# Patient Record
Sex: Female | Born: 1945
Health system: Southern US, Community
[De-identification: ages and names within clinical notes are randomized; demographics above are authoritative.]

## PROBLEM LIST (undated history)

## (undated) DIAGNOSIS — C946 Myelodysplastic disease, not classified: Secondary | ICD-10-CM

## (undated) DIAGNOSIS — F32A Depression, unspecified: Secondary | ICD-10-CM

## (undated) DIAGNOSIS — F419 Anxiety disorder, unspecified: Secondary | ICD-10-CM

## (undated) DIAGNOSIS — K449 Diaphragmatic hernia without obstruction or gangrene: Secondary | ICD-10-CM

## (undated) DIAGNOSIS — E611 Iron deficiency: Secondary | ICD-10-CM

## (undated) DIAGNOSIS — Z9289 Personal history of other medical treatment: Secondary | ICD-10-CM

## (undated) DIAGNOSIS — F329 Major depressive disorder, single episode, unspecified: Secondary | ICD-10-CM

## (undated) DIAGNOSIS — K5790 Diverticulosis of intestine, part unspecified, without perforation or abscess without bleeding: Secondary | ICD-10-CM

## (undated) DIAGNOSIS — K219 Gastro-esophageal reflux disease without esophagitis: Secondary | ICD-10-CM

## (undated) DIAGNOSIS — I10 Essential (primary) hypertension: Secondary | ICD-10-CM

## (undated) DIAGNOSIS — E785 Hyperlipidemia, unspecified: Secondary | ICD-10-CM

## (undated) DIAGNOSIS — D126 Benign neoplasm of colon, unspecified: Secondary | ICD-10-CM

## (undated) DIAGNOSIS — D649 Anemia, unspecified: Secondary | ICD-10-CM

## (undated) HISTORY — DX: Depression, unspecified: F32.A

## (undated) HISTORY — PX: TUBAL LIGATION: SHX77

## (undated) HISTORY — DX: Anxiety disorder, unspecified: F41.9

## (undated) HISTORY — PX: KNEE SURGERY: SHX244

## (undated) HISTORY — DX: Diaphragmatic hernia without obstruction or gangrene: K44.9

## (undated) HISTORY — DX: Major depressive disorder, single episode, unspecified: F32.9

## (undated) HISTORY — DX: Anemia, unspecified: D64.9

## (undated) HISTORY — DX: Iron deficiency: E61.1

## (undated) HISTORY — DX: Gastro-esophageal reflux disease without esophagitis: K21.9

## (undated) HISTORY — DX: Diverticulosis of intestine, part unspecified, without perforation or abscess without bleeding: K57.90

## (undated) HISTORY — DX: Hyperlipidemia, unspecified: E78.5

## (undated) HISTORY — DX: Benign neoplasm of colon, unspecified: D12.6

---

## 1999-09-06 ENCOUNTER — Other Ambulatory Visit: Admission: RE | Admit: 1999-09-06 | Discharge: 1999-09-06 | Payer: Self-pay | Admitting: Family Medicine

## 2000-03-07 ENCOUNTER — Encounter: Payer: Self-pay | Admitting: Gastroenterology

## 2000-03-07 ENCOUNTER — Ambulatory Visit (HOSPITAL_COMMUNITY): Admission: RE | Admit: 2000-03-07 | Discharge: 2000-03-07 | Payer: Self-pay | Admitting: Gastroenterology

## 2002-09-21 ENCOUNTER — Encounter: Admission: RE | Admit: 2002-09-21 | Discharge: 2002-09-21 | Payer: Self-pay | Admitting: Family Medicine

## 2002-09-21 ENCOUNTER — Encounter: Payer: Self-pay | Admitting: Family Medicine

## 2003-06-13 ENCOUNTER — Encounter: Admission: RE | Admit: 2003-06-13 | Discharge: 2003-06-13 | Payer: Self-pay | Admitting: Family Medicine

## 2003-06-13 ENCOUNTER — Encounter: Payer: Self-pay | Admitting: Family Medicine

## 2003-06-28 ENCOUNTER — Other Ambulatory Visit: Admission: RE | Admit: 2003-06-28 | Discharge: 2003-06-28 | Payer: Self-pay | Admitting: Obstetrics and Gynecology

## 2003-08-05 ENCOUNTER — Encounter (INDEPENDENT_AMBULATORY_CARE_PROVIDER_SITE_OTHER): Payer: Self-pay | Admitting: *Deleted

## 2003-08-05 ENCOUNTER — Ambulatory Visit (HOSPITAL_COMMUNITY): Admission: RE | Admit: 2003-08-05 | Discharge: 2003-08-05 | Payer: Self-pay | Admitting: Obstetrics and Gynecology

## 2005-02-18 ENCOUNTER — Other Ambulatory Visit: Admission: RE | Admit: 2005-02-18 | Discharge: 2005-02-18 | Payer: Self-pay | Admitting: Obstetrics and Gynecology

## 2005-03-11 ENCOUNTER — Encounter: Payer: Self-pay | Admitting: Family Medicine

## 2005-03-11 LAB — CONVERTED CEMR LAB

## 2005-04-03 ENCOUNTER — Ambulatory Visit: Payer: Self-pay | Admitting: Family Medicine

## 2005-09-12 ENCOUNTER — Ambulatory Visit: Payer: Self-pay | Admitting: Family Medicine

## 2005-10-31 ENCOUNTER — Ambulatory Visit: Payer: Self-pay | Admitting: Family Medicine

## 2005-11-25 ENCOUNTER — Encounter: Admission: RE | Admit: 2005-11-25 | Discharge: 2005-11-25 | Payer: Self-pay | Admitting: Family Medicine

## 2006-06-17 ENCOUNTER — Ambulatory Visit: Payer: Self-pay | Admitting: Family Medicine

## 2006-07-02 ENCOUNTER — Ambulatory Visit: Payer: Self-pay | Admitting: Family Medicine

## 2006-11-10 ENCOUNTER — Ambulatory Visit: Payer: Self-pay | Admitting: Family Medicine

## 2006-12-01 ENCOUNTER — Ambulatory Visit: Payer: Self-pay | Admitting: Family Medicine

## 2007-02-16 ENCOUNTER — Ambulatory Visit: Payer: Self-pay | Admitting: Family Medicine

## 2007-03-12 DIAGNOSIS — D126 Benign neoplasm of colon, unspecified: Secondary | ICD-10-CM

## 2007-03-12 HISTORY — DX: Benign neoplasm of colon, unspecified: D12.6

## 2007-03-12 LAB — HM COLONOSCOPY

## 2007-04-07 ENCOUNTER — Ambulatory Visit: Payer: Self-pay | Admitting: Internal Medicine

## 2007-04-08 ENCOUNTER — Telehealth: Payer: Self-pay | Admitting: Family Medicine

## 2007-04-09 ENCOUNTER — Ambulatory Visit: Payer: Self-pay | Admitting: Internal Medicine

## 2007-04-09 ENCOUNTER — Inpatient Hospital Stay (HOSPITAL_COMMUNITY): Admission: EM | Admit: 2007-04-09 | Discharge: 2007-04-11 | Payer: Self-pay | Admitting: Emergency Medicine

## 2007-04-09 LAB — CONVERTED CEMR LAB
HCT: 15.2 % — CL (ref 36.0–46.0)
Hemoglobin: 4.5 g/dL — CL (ref 12.0–15.0)
WBC: 6.3 10*3/uL (ref 4.5–10.5)

## 2007-04-10 ENCOUNTER — Encounter: Payer: Self-pay | Admitting: Family Medicine

## 2007-04-10 ENCOUNTER — Encounter: Payer: Self-pay | Admitting: Gastroenterology

## 2007-04-11 ENCOUNTER — Encounter: Payer: Self-pay | Admitting: Internal Medicine

## 2007-04-13 ENCOUNTER — Ambulatory Visit: Payer: Self-pay | Admitting: Family Medicine

## 2007-04-13 DIAGNOSIS — Z86010 Personal history of colonic polyps: Secondary | ICD-10-CM | POA: Insufficient documentation

## 2007-04-14 ENCOUNTER — Ambulatory Visit: Payer: Self-pay | Admitting: Gastroenterology

## 2007-04-20 DIAGNOSIS — K5730 Diverticulosis of large intestine without perforation or abscess without bleeding: Secondary | ICD-10-CM | POA: Insufficient documentation

## 2007-04-20 DIAGNOSIS — K219 Gastro-esophageal reflux disease without esophagitis: Secondary | ICD-10-CM | POA: Insufficient documentation

## 2007-04-28 ENCOUNTER — Ambulatory Visit: Payer: Self-pay | Admitting: Family Medicine

## 2007-04-28 DIAGNOSIS — D649 Anemia, unspecified: Secondary | ICD-10-CM | POA: Insufficient documentation

## 2007-04-29 LAB — CONVERTED CEMR LAB
Eosinophils Absolute: 0 10*3/uL (ref 0.0–0.6)
Eosinophils Relative: 1.1 % (ref 0.0–5.0)
HCT: 29 % — ABNORMAL LOW (ref 36.0–46.0)
Lymphocytes Relative: 36.6 % (ref 12.0–46.0)
MCHC: 32.1 g/dL (ref 30.0–36.0)
MCV: 78 fL (ref 78.0–100.0)
Monocytes Absolute: 0.5 10*3/uL (ref 0.2–0.7)
Neutro Abs: 1.5 10*3/uL (ref 1.4–7.7)
Neutrophils Relative %: 47.6 % (ref 43.0–77.0)
WBC: 3.2 10*3/uL — ABNORMAL LOW (ref 4.5–10.5)

## 2007-08-05 ENCOUNTER — Encounter: Payer: Self-pay | Admitting: Family Medicine

## 2007-08-05 DIAGNOSIS — F329 Major depressive disorder, single episode, unspecified: Secondary | ICD-10-CM

## 2007-08-05 DIAGNOSIS — F418 Other specified anxiety disorders: Secondary | ICD-10-CM | POA: Insufficient documentation

## 2007-08-05 DIAGNOSIS — I10 Essential (primary) hypertension: Secondary | ICD-10-CM | POA: Insufficient documentation

## 2007-08-06 ENCOUNTER — Ambulatory Visit: Payer: Self-pay | Admitting: Family Medicine

## 2007-08-06 LAB — CONVERTED CEMR LAB
Bilirubin Urine: NEGATIVE
Casts: 0 /lpf
Nitrite: NEGATIVE
Specific Gravity, Urine: 1.01
Urine crystals, microscopic: 0 /hpf
Yeast, UA: 0

## 2007-08-07 ENCOUNTER — Encounter: Payer: Self-pay | Admitting: Family Medicine

## 2007-09-29 ENCOUNTER — Ambulatory Visit: Payer: Self-pay | Admitting: Family Medicine

## 2008-07-04 ENCOUNTER — Ambulatory Visit: Payer: Self-pay | Admitting: Family Medicine

## 2008-07-05 ENCOUNTER — Encounter: Payer: Self-pay | Admitting: Family Medicine

## 2008-07-05 ENCOUNTER — Telehealth (INDEPENDENT_AMBULATORY_CARE_PROVIDER_SITE_OTHER): Payer: Self-pay | Admitting: *Deleted

## 2008-08-09 ENCOUNTER — Other Ambulatory Visit: Admission: RE | Admit: 2008-08-09 | Discharge: 2008-08-09 | Payer: Self-pay | Admitting: Family Medicine

## 2008-08-09 ENCOUNTER — Ambulatory Visit: Payer: Self-pay | Admitting: Family Medicine

## 2008-08-09 ENCOUNTER — Encounter: Payer: Self-pay | Admitting: Family Medicine

## 2008-08-09 DIAGNOSIS — N951 Menopausal and female climacteric states: Secondary | ICD-10-CM | POA: Insufficient documentation

## 2008-08-09 LAB — HM PAP SMEAR

## 2008-08-18 LAB — CONVERTED CEMR LAB
ALT: 18 units/L (ref 0–35)
AST: 22 units/L (ref 0–37)
Albumin: 3.6 g/dL (ref 3.5–5.2)
BUN: 13 mg/dL (ref 6–23)
Basophils Relative: 0.2 % (ref 0.0–3.0)
CO2: 28 meq/L (ref 19–32)
Chloride: 102 meq/L (ref 96–112)
Creatinine, Ser: 0.7 mg/dL (ref 0.4–1.2)
Direct LDL: 148.6 mg/dL
Eosinophils Absolute: 0 10*3/uL (ref 0.0–0.7)
Eosinophils Relative: 0.8 % (ref 0.0–5.0)
HDL: 38.8 mg/dL — ABNORMAL LOW (ref >39.0)
MCV: 79.7 fL (ref 78.0–100.0)
Neutrophils Relative %: 44.8 % (ref 43.0–77.0)
RBC: 3.74 M/uL — ABNORMAL LOW (ref 3.87–5.11)
Total Protein: 8.5 g/dL — ABNORMAL HIGH (ref 6.0–8.3)
WBC: 3.7 10*3/uL — ABNORMAL LOW (ref 4.5–10.5)

## 2008-09-05 ENCOUNTER — Encounter: Admission: RE | Admit: 2008-09-05 | Discharge: 2008-09-05 | Payer: Self-pay | Admitting: Family Medicine

## 2009-04-03 ENCOUNTER — Ambulatory Visit: Payer: Self-pay | Admitting: Family Medicine

## 2009-07-14 ENCOUNTER — Telehealth: Payer: Self-pay | Admitting: Family Medicine

## 2009-08-28 ENCOUNTER — Telehealth: Payer: Self-pay | Admitting: Family Medicine

## 2009-10-20 ENCOUNTER — Ambulatory Visit: Payer: Self-pay | Admitting: Family Medicine

## 2009-10-23 LAB — CONVERTED CEMR LAB
AST: 83 units/L — ABNORMAL HIGH (ref 0–37)
Albumin: 3.9 g/dL (ref 3.5–5.2)
Alkaline Phosphatase: 105 units/L (ref 39–117)
CO2: 32 meq/L (ref 19–32)
Direct LDL: 163 mg/dL
Glucose, Bld: 99 mg/dL (ref 70–99)
Hemoglobin: 13.5 g/dL (ref 12.0–15.0)
Lymphocytes Relative: 40.5 % (ref 12.0–46.0)
MCHC: 33.7 g/dL (ref 30.0–36.0)
MCV: 101.5 fL — ABNORMAL HIGH (ref 78.0–100.0)
Neutrophils Relative %: 48.2 % (ref 43.0–77.0)
Potassium: 3.7 meq/L (ref 3.5–5.1)
RDW: 13.4 % (ref 11.5–14.6)
Sodium: 139 meq/L (ref 135–145)
Total Protein: 8.3 g/dL (ref 6.0–8.3)

## 2009-10-24 ENCOUNTER — Ambulatory Visit: Payer: Self-pay | Admitting: Family Medicine

## 2009-10-24 DIAGNOSIS — R7401 Elevation of levels of liver transaminase levels: Secondary | ICD-10-CM | POA: Insufficient documentation

## 2009-10-24 DIAGNOSIS — D696 Thrombocytopenia, unspecified: Secondary | ICD-10-CM | POA: Insufficient documentation

## 2009-10-24 DIAGNOSIS — R740 Nonspecific elevation of levels of transaminase and lactic acid dehydrogenase [LDH]: Secondary | ICD-10-CM

## 2009-10-24 DIAGNOSIS — D539 Nutritional anemia, unspecified: Secondary | ICD-10-CM | POA: Insufficient documentation

## 2009-10-25 LAB — CONVERTED CEMR LAB: Hepatitis B Surface Ag: NEGATIVE

## 2009-10-27 LAB — CONVERTED CEMR LAB
ALT: 90 units/L — ABNORMAL HIGH (ref 0–35)
AST: 62 units/L — ABNORMAL HIGH (ref 0–37)
Basophils Relative: 0.6 % (ref 0.0–3.0)
HCT: 40.5 % (ref 36.0–46.0)
Hemoglobin: 13.3 g/dL (ref 12.0–15.0)
Monocytes Relative: 13.5 % — ABNORMAL HIGH (ref 3.0–12.0)
RBC: 4.09 M/uL (ref 3.87–5.11)
RDW: 13.7 % (ref 11.5–14.6)

## 2009-11-02 ENCOUNTER — Encounter: Admission: RE | Admit: 2009-11-02 | Discharge: 2009-11-02 | Payer: Self-pay | Admitting: Family Medicine

## 2009-11-06 ENCOUNTER — Encounter (INDEPENDENT_AMBULATORY_CARE_PROVIDER_SITE_OTHER): Payer: Self-pay | Admitting: *Deleted

## 2010-07-03 ENCOUNTER — Telehealth: Payer: Self-pay | Admitting: Family Medicine

## 2010-07-18 ENCOUNTER — Ambulatory Visit: Payer: Self-pay | Admitting: Family Medicine

## 2010-10-22 ENCOUNTER — Ambulatory Visit: Payer: Self-pay | Admitting: Family Medicine

## 2010-10-23 DIAGNOSIS — D708 Other neutropenia: Secondary | ICD-10-CM | POA: Insufficient documentation

## 2010-10-23 LAB — CONVERTED CEMR LAB
ALT: 54 units/L — ABNORMAL HIGH (ref 0–35)
Alkaline Phosphatase: 89 units/L (ref 39–117)
BUN: 11 mg/dL (ref 6–23)
Basophils Relative: 0.6 % (ref 0.0–3.0)
Bilirubin, Direct: 0 mg/dL (ref 0.0–0.3)
Calcium: 10.4 mg/dL (ref 8.4–10.5)
Chloride: 102 meq/L (ref 96–112)
Cholesterol: 226 mg/dL — ABNORMAL HIGH (ref 0–200)
Creatinine, Ser: 0.7 mg/dL (ref 0.4–1.2)
Eosinophils Relative: 1 % (ref 0.0–5.0)
Folate: 9.1 ng/mL
GFR calc non Af Amer: 112.23 mL/min (ref >60.00)
HDL: 61.5 mg/dL (ref >39.00)
Lymphocytes Relative: 46.1 % — ABNORMAL HIGH (ref 12.0–46.0)
Neutrophils Relative %: 38.7 % — ABNORMAL LOW (ref 43.0–77.0)
Platelets: 120 10*3/uL — ABNORMAL LOW (ref 150.0–400.0)
RBC: 3.81 M/uL — ABNORMAL LOW (ref 3.87–5.11)
Total Bilirubin: 0.5 mg/dL (ref 0.3–1.2)
Total CHOL/HDL Ratio: 4
Total Protein: 7.2 g/dL (ref 6.0–8.3)
Triglycerides: 45 mg/dL (ref 0.0–149.0)
VLDL: 9 mg/dL (ref 0.0–40.0)
Vitamin B-12: 900 pg/mL (ref 211–911)
WBC: 2.6 10*3/uL — ABNORMAL LOW (ref 4.5–10.5)

## 2010-10-30 ENCOUNTER — Encounter: Payer: Self-pay | Admitting: Family Medicine

## 2010-10-30 ENCOUNTER — Ambulatory Visit: Payer: Self-pay | Admitting: Oncology

## 2010-11-06 ENCOUNTER — Encounter
Admission: RE | Admit: 2010-11-06 | Discharge: 2010-11-06 | Payer: Self-pay | Source: Home / Self Care | Attending: Family Medicine | Admitting: Family Medicine

## 2010-11-06 LAB — HM MAMMOGRAPHY: HM Mammogram: NORMAL

## 2010-11-07 ENCOUNTER — Encounter: Payer: Self-pay | Admitting: Family Medicine

## 2010-11-07 ENCOUNTER — Encounter (INDEPENDENT_AMBULATORY_CARE_PROVIDER_SITE_OTHER): Payer: Self-pay | Admitting: *Deleted

## 2010-11-09 ENCOUNTER — Ambulatory Visit: Admit: 2010-11-09 | Payer: Self-pay | Admitting: Family Medicine

## 2010-11-11 ENCOUNTER — Ambulatory Visit: Payer: Self-pay | Admitting: Oncology

## 2010-12-02 ENCOUNTER — Encounter: Payer: Self-pay | Admitting: Family Medicine

## 2010-12-11 NOTE — Assessment & Plan Note (Signed)
Summary: head cold/alc   Vital Signs:  Patient profile:   65 year old female Height:      62.25 inches Weight:      165.75 pounds BMI:     30.18 Temp:     97.7 degrees F oral Pulse rate:   60 / minute Pulse rhythm:   regular BP sitting:   122 / 76  (left arm) Cuff size:   regular  Vitals Entered By: Lewanda Rife LPN (July 18, 2010 12:13 PM) CC: Head cold, head congested and head feels heavy. Productive cough with green phlegm.   History of Present Illness: think she has a cold  started symptoms 2 weeks ago -- and just not getting better exhausted  head congestion with sinus pressur prod cough -- green mucous from chest and also head no fever  no nvd   was taking some mucinex and robitussin DM for cough  then alka selzer plus -- knows that can raise bp  also allegra   Allergies (verified): No Known Drug Allergies  Past History:  Past Medical History: Last updated: 08/05/2007 anemia, s/p GI bleed colon polyps gerd HH, stricture Depression Hypertension  Past Surgical History: Last updated: 08/05/2007 5/08 hosp for lower GI bleed colonosc 5/08 tics, hem, polyps (to re check 03/2012) EGD 5/08 gastritis, HH, stricture, GERD Uterine US- prominant endometrias (06/2003) Tubal ligation  Family History: Last updated: 06-Nov-2009 mother -- HTN father -- died ? cancer  sibs -- HTN  brother - abnormal platelets sister with low B12   Social History: Last updated: 08/09/2008 Marital Status: Married Children:  Occupation: [employer] non smoker - never smoked  no alcohol   Risk Factors: Smoking Status: never (08/05/2007)  Review of Systems General:  Denies fatigue, loss of appetite, and malaise. Eyes:  Denies blurring and eye irritation. ENT:  Complains of nasal congestion, postnasal drainage, sinus pressure, and sore throat; denies earache. CV:  Denies chest pain or discomfort, palpitations, and shortness of breath with exertion. Resp:   Complains of cough and sputum productive; denies pleuritic, shortness of breath, and wheezing. GI:  Denies diarrhea, nausea, and vomiting. Derm:  Denies rash.  Physical Exam  General:  Well-developed,well-nourished,in no acute distress; alert,appropriate and cooperative throughout examination Head:  normocephalic, atraumatic, and no abnormalities observed.  tender R ethmoid and maxillary sinus tenderness Eyes:  vision grossly intact, pupils equal, pupils round, pupils reactive to light, and no injection.   Ears:  R ear normal and L ear normal.   Nose:  nares are injected and congested bilaterally  Mouth:  pharynx pink and moist, no erythema, and no exudates.  some mild post nasal drip Neck:  No deformities, masses, or tenderness noted. Lungs:  Normal respiratory effort, chest expands symmetrically. Lungs are clear to auscultation, no crackles or wheezes. Heart:  Normal rate and regular rhythm. S1 and S2 normal without gallop, murmur, click, rub or other extra sounds. Skin:  Intact without suspicious lesions or rashes Cervical Nodes:  No lymphadenopathy noted Psych:  normal affect, talkative and pleasant    Impression & Recommendations:  Problem # 1:  SINUSITIS - ACUTE-NOS (ICD-461.9) Assessment New  with R sided sinus pain and pressure and 14 d of symptoms following uri  tx with augmentin  recommend sympt care- see pt instructions   pt advised to update me if symptoms worsen or do not improve  Her updated medication list for this problem includes:    Augmentin 875-125 Mg Tabs (Amoxicillin-pot clavulanate) .Marland Kitchen... 1 by  mouth two times a day for 10 days for sinus infection  Orders: Prescription Created Electronically [phone]  Complete Medication List: 1)  Hydrochlorothiazide 25 Mg Tabs (Hydrochlorothiazide) .Marland Kitchen.. 1 by mouth once daily 2)  Triamcinolone Acetonide 0.025 % Crea (Triamcinolone acetonide) .... Apply very small amount to affected area once daily for 5 days asd needed. 3)   Centrum Mvi  .Marland Kitchen.. 1 by mouth once daily 4)  Zoloft 50 Mg Tabs (Sertraline hcl) .Marland Kitchen.. 1 by mouth once daily 5)  Augmentin 875-125 Mg Tabs (Amoxicillin-pot clavulanate) .Marland Kitchen.. 1 by mouth two times a day for 10 days for sinus infection  Patient Instructions: 1)  you can try mucinex over the counter twice daily as directed and nasal saline spray for congestion 2)  tylenol over the counter as directed may help with aches, headache and fever 3)  call if symptoms worsen or if not improved in 4-5 days  4)  take the augmentin for sinus infection as directed  5)  I sent this to your pharmacy Prescriptions: AUGMENTIN 875-125 MG TABS (AMOXICILLIN-POT CLAVULANATE) 1 by mouth two times a day for 10 days for sinus infection  #20 x 0   Entered and Authorized by:   Judith Part MD   Signed by:   Judith Part MD on 07/18/2010   Method used:   [facility]        [id number]       [address]       [place], [place]  [address]       Ph: [phone]       Fax: [phone]   RxID:   [id number]   Current Allergies (reviewed today): No known allergies

## 2010-12-11 NOTE — Progress Notes (Signed)
Summary: Sertraline refill  Phone Note Refill Request Call back at [phone] ([facility]) Message from:  [facility] on July 03, 2010 8:17 AM  Refills Requested: Medication #1:  ZOLOFT 50 MG TABS 1 by mouth once daily. Electronic request for refill #30. OK to refill?   Method Requested: Electronic Initial call taken by: Janee Morn CMA Duncan Dull),  July 03, 2010 8:18 AM  Follow-up for Phone Call        px written on EMR for call in  Follow-up by: Judith Part MD,  July 03, 2010 9:58 AM  Additional Follow-up for Phone Call Additional follow up Details #1::        Rx called to pharmacy Additional Follow-up by: Sydell Axon LPN,  July 03, 2010 1:15 PM    New/Updated Medications: ZOLOFT 50 MG TABS (SERTRALINE HCL) 1 by mouth once daily Prescriptions: ZOLOFT 50 MG TABS (SERTRALINE HCL) 1 by mouth once daily  #30 x 11   Entered and Authorized by:   Judith Part MD   Signed by:   Judith Part MD on 07/03/2010   Method used:   Telephoned to ...       [id number]       [address]       [place], [place]  [address]       Ph: [phone]       Fax: [phone]   RxID:   [id number]

## 2010-12-11 NOTE — Letter (Signed)
Summary: [facility]-Anemia, Diverticular Disease  [facility]-Anemia, Diverticular Disease   Imported By: Maryln Gottron 04/27/2010 12:59:40  _____________________________________________________________________  External Attachment:    Type:   Image     Comment:   External Document

## 2010-12-13 NOTE — Letter (Signed)
Summary: Results Follow up Letter  [place] [facility]  [address] [place], [place] [address]   Phone: [phone]  Fax: [phone]    11/07/2010 MRN: [id number]  [facility] [address] [place], [place]  [address]  Dear Kathryn Weaver,  The following are the results of your recent test(s):  Test         Result    Pap Smear:        Normal _____  Not Normal _____ Comments: ______________________________________________________ Cholesterol: LDL(Bad cholesterol):         Your goal is less than:         HDL (Good cholesterol):       Your goal is more than: Comments:  ______________________________________________________ Mammogram:        Normal __X___  Not Normal _____ Comments: Repeat in 1 year  ___________________________________________________________________ Hemoccult:        Normal _____  Not normal _______ Comments:    _____________________________________________________________________ Other Tests:    We routinely do not discuss normal results over the telephone.  If you desire a copy of the results, or you have any questions about this information we can discuss them at your next office visit.   Sincerely,       Sharilyn Sites for Dr. Roxy Manns

## 2010-12-13 NOTE — Assessment & Plan Note (Signed)
Summary: CPX/CLE   Vital Signs:  Patient profile:   65 year old female Height:      62.75 inches Weight:      171.75 pounds BMI:     30.78 Temp:     97.4 degrees F oral Pulse rate:   56 / minute Pulse rhythm:   regular BP sitting:   126 / 72  (left arm) Cuff size:   regular  Vitals Entered By: Lewanda Rife LPN (October 22, 2010 9:48 AM) CC: CPX LMP 1998   History of Present Illness: here for health mt exam and also to disc chronic med problems   is feeling just fine- nothing new   wt is up 6 lb with bmi of 30  HTN in good control 126/72 today   hx of fibroids pap nl in 9/09 no problems at all    mam 12/10 nl  self exam ok - no lumps or problems   colonosc- polyps in 08 due in 5/13 no bowel changes   Tdap 9/09 flu-- does not want  ptx not interested  zoster -has never had   ca and D-- is taking once in a while  no OP in family   hx of macrocytosis and low platelet due for check on that  Allergies (verified): No Known Drug Allergies  Past History:  Past Medical History: Last updated: 08/05/2007 anemia, s/p GI bleed colon polyps gerd HH, stricture Depression Hypertension  Past Surgical History: Last updated: 08/05/2007 5/08 hosp for lower GI bleed colonosc 5/08 tics, hem, polyps (to re check 03/2012) EGD 5/08 gastritis, HH, stricture, GERD Uterine US- prominant endometrias (06/2003) Tubal ligation  Family History: Last updated: Nov 01, 2009 mother -- HTN father -- died ? cancer  sibs -- HTN  brother - abnormal platelets sister with low B12   Social History: Last updated: 08/09/2008 Marital Status: Married Children:  Occupation: [employer] non smoker - never smoked  no alcohol   Risk Factors: Smoking Status: never (08/05/2007)  Review of Systems General:  Denies fatigue, loss of appetite, and malaise. Eyes:  Denies blurring and eye irritation. CV:  Denies chest pain or discomfort, lightheadness, palpitations, and  shortness of breath with exertion. Resp:  Denies cough, shortness of breath, and wheezing. GI:  Denies abdominal pain, bloody stools, change in bowel habits, and indigestion. GU:  Denies dysuria and urinary frequency. MS:  Denies muscle aches and cramps. Derm:  Denies itching, lesion(s), poor wound healing, and rash. Neuro:  Denies numbness. Endo:  Denies cold intolerance, excessive thirst, excessive urination, and heat intolerance. Heme:  Denies abnormal bruising and bleeding.  Physical Exam  General:  overweight but generally well appearing  Head:  normocephalic, atraumatic, and no abnormalities observed.   Eyes:  vision grossly intact, pupils equal, pupils reactive to light, and pupils react to accomodation.  no conjunctival pallor, injection or icterus  Ears:  R ear normal and L ear normal.   Nose:  no nasal discharge.   Mouth:  pharynx pink and moist.   Neck:  supple with full rom and no masses or thyromegally, no JVD or carotid bruit  Chest Wall:  No deformities, masses, or tenderness noted. Breasts:  No mass, nodules, thickening, tenderness, bulging, retraction, inflamation, nipple discharge or skin changes noted.   Lungs:  Normal respiratory effort, chest expands symmetrically. Lungs are clear to auscultation, no crackles or wheezes. Heart:  Normal rate and regular rhythm. S1 and S2 normal without gallop, murmur, click, rub or other extra sounds. Abdomen:  Bowel sounds positive,abdomen soft and non-tender without masses, organomegaly or hernias noted. no renal bruits Msk:  No deformity or scoliosis noted of thoracic or lumbar spine.  no new joint changes Pulses:  R and L carotid,radial,femoral,dorsalis pedis and posterior tibial pulses are full and equal bilaterally Extremities:  No clubbing, cyanosis, edema, or deformity noted with normal full range of motion of all joints.   Neurologic:  sensation intact to light touch, gait normal, and DTRs symmetrical and normal.   Skin:   Intact without suspicious lesions or rashes Cervical Nodes:  No lymphadenopathy noted Axillary Nodes:  No palpable lymphadenopathy Inguinal Nodes:  No significant adenopathy Psych:  normal affect, talkative and pleasant    Impression & Recommendations:  Problem # 1:  HEALTH MAINTENANCE EXAM (ICD-V70.0) Assessment Comment Only reviewed health habits including diet, exercise and skin cancer prevention reviewed health maintenance list and family history  will call if interested in zoster declines flu shot  colonosc due in 2013 will do gyn exam next year on 3 year schedule Orders: Venipuncture ([address]) TLB-Lipid Panel (80061-LIPID) TLB-BMP (Basic Metabolic Panel-BMET) (80048-METABOL) TLB-CBC Platelet - w/Differential (85025-CBCD) TLB-Hepatic/Liver Function Pnl (80076-HEPATIC) TLB-TSH (Thyroid Stimulating Hormone) (84443-TSH) TLB-B12 + Folate Pnl (60454_09811-B14/NWG)  Problem # 2:  ANEMIA, MACROCYTIC (ICD-281.9) Assessment: Unchanged lab and update with B12 Orders: Venipuncture ([address]21) TLB-Lipid Panel (80061-LIPID) TLB-BMP (Basic Metabolic Panel-BMET) (80048-METABOL) TLB-CBC Platelet - w/Differential (85025-CBCD) TLB-Hepatic/Liver Function Pnl (80076-HEPATIC) TLB-TSH (Thyroid Stimulating Hormone) (84443-TSH) TLB-B12 + Folate Pnl (30865_78469-G29/BMW)  Problem # 3:  THROMBOCYTOPENIA (ICD-287.5) Assessment: Unchanged lab today and update no bruising or bleeding  Orders: Venipuncture ([address]) TLB-Lipid Panel (80061-LIPID) TLB-BMP (Basic Metabolic Panel-BMET) (80048-METABOL) TLB-CBC Platelet - w/Differential (85025-CBCD) TLB-Hepatic/Liver Function Pnl (80076-HEPATIC) TLB-TSH (Thyroid Stimulating Hormone) (84443-TSH) TLB-B12 + Folate Pnl (40102_72536-U44/IHK)  Problem # 4:  TRANSAMINASES, SERUM, ELEVATED (ICD-790.4) Assessment: Unchanged lab today and update ? fatty liver rev low fat diet  Orders: Venipuncture ([address]) TLB-Lipid Panel (80061-LIPID) TLB-BMP (Basic  Metabolic Panel-BMET) (80048-METABOL) TLB-CBC Platelet - w/Differential (85025-CBCD) TLB-Hepatic/Liver Function Pnl (80076-HEPATIC) TLB-TSH (Thyroid Stimulating Hormone) (84443-TSH) TLB-B12 + Folate Pnl (56387_56433-I95/JOA)  Problem # 5:  OTHER SCREENING MAMMOGRAM (ICD-V76.12) Assessment: Comment Only annual mammogram scheduled adv pt to continue regular self breast exams non remarkable breast exam today  Orders: Radiology Referral (Radiology)  Problem # 6:  HYPERTENSION (ICD-401.9) Assessment: Unchanged  good control lab today urged to get back to exercise Her updated medication list for this problem includes:    Hydrochlorothiazide 25 Mg Tabs (Hydrochlorothiazide) .Marland Kitchen... 1 by mouth once daily  Orders: Venipuncture (41660) TLB-Lipid Panel (80061-LIPID) TLB-BMP (Basic Metabolic Panel-BMET) (80048-METABOL) TLB-CBC Platelet - w/Differential (85025-CBCD) TLB-Hepatic/Liver Function Pnl (80076-HEPATIC) TLB-TSH (Thyroid Stimulating Hormone) (84443-TSH) TLB-B12 + Folate Pnl (63016_01093-A35/TDD)  BP today: 126/72 Prior BP: 122/76 (07/18/2010)  Prior 10 Yr Risk Heart Disease: Not enough information (10/24/2009)  Labs Reviewed: K+: 3.7 (10/20/2009) Creat: : 0.6 (10/20/2009)   Chol: 254 (10/20/2009)   HDL: 44.10 (10/20/2009)   LDL: DEL (08/09/2008)   TG: 69.0 (10/20/2009)  Complete Medication List: 1)  Hydrochlorothiazide 25 Mg Tabs (Hydrochlorothiazide) .Marland Kitchen.. 1 by mouth once daily 2)  Triamcinolone Acetonide 0.025 % Crea (Triamcinolone acetonide) .... Apply very small amount to affected area once daily for 5 days asd needed. 3)  Centrum Mvi  .Marland Kitchen.. 1 by mouth once daily 4)  Zoloft 50 Mg Tabs (Sertraline hcl) .Marland Kitchen.. 1 by mouth once daily 5)  Slow Fe Iron ?mg  .... One tablelt by mouth occasionally  Contraindications/Deferment of Procedures/Staging:    Test/Procedure: FLU VAX  Reason for deferment: patient declined   Patient Instructions: 1)  if you are interested in shingles  vaccine - call ins co about coverage and then call us to schedule  2)  the current recommendation for calcium intake is 1200-1500 mg daily with -1000 IU of vitamin D  3)  we will schedule mammogram at check out  4)  get back to regular exercise  5)  labs today    Orders Added: 1)  Venipuncture [36415] 2)  TLB-Lipid Panel [80061-LIPID] 3)  TLB-BMP (Basic Metabolic Panel-BMET) [80048-METABOL] 4)  TLB-CBC Platelet - w/Differential [85025-CBCD] 5)  TLB-Hepatic/Liver Function Pnl [80076-HEPATIC] 6)  TLB-TSH (Thyroid Stimulating Hormone) [84443-TSH] 7)  TLB-B12 + Folate Pnl [82746_82607-B12/FOL] 8)  Radiology Referral [Radiology] 9)  Est. Patient 40-64 years [81191]    Current Allergies (reviewed today): No known allergies      Prevention & Chronic Care Immunizations   Influenza vaccine: Not documented   Influenza vaccine deferral: patient declined  (10/22/2010)    Tetanus booster: 08/09/2008: Tdap    Pneumococcal vaccine: Not documented    H. zoster vaccine: Not documented  Colorectal Screening   Hemoccult: positive  (04/07/2007)    Colonoscopy: Not documented  Other Screening   Pap smear: NEGATIVE FOR INTRAEPITHELIAL LESIONS OR MALIGNANCY.  (08/09/2008)    Mammogram: normal  (11/06/2009)   Mammogram action/deferral: Screening mammogram in 1 year.     (09/05/2008)   Mammogram due: 11/2010    DXA bone density scan: Not documented   Smoking status: never  (08/05/2007)  Lipids   Total Cholesterol: 254  (10/20/2009)   LDL: DEL  (08/09/2008)   LDL Direct: 163.0  (10/20/2009)   HDL: 44.10  (10/20/2009)   Triglycerides: 69.0  (10/20/2009)  Hypertension   Last Blood Pressure: 126 / 72  (10/22/2010)   Serum creatinine: 0.6  (10/20/2009)   Serum potassium 3.7  (10/20/2009)  Self-Management Support :    Hypertension self-management support: Not documented

## 2010-12-13 NOTE — Miscellaneous (Signed)
Summary: Mammogram update   Clinical Lists Changes  Observations: Added new observation of MAMMO DUE: 11/2011 (11/07/2010 15:04) Added new observation of MAMMOGRAM: normal (11/06/2010 15:05)      Preventive Care Screening  Mammogram:    Date:  11/06/2010    Next Due:  11/2011    Results:  normal

## 2010-12-13 NOTE — Letter (Signed)
Summary: [facility]  [facility]   Imported By: Maryln Gottron 12/03/2010 14:08:03  _____________________________________________________________________  External Attachment:    Type:   Image     Comment:   External Document

## 2011-01-31 ENCOUNTER — Ambulatory Visit: Payer: Self-pay | Admitting: Oncology

## 2011-02-10 ENCOUNTER — Ambulatory Visit: Payer: Self-pay | Admitting: Oncology

## 2011-02-15 ENCOUNTER — Other Ambulatory Visit: Payer: Self-pay | Admitting: Family Medicine

## 2011-03-12 ENCOUNTER — Ambulatory Visit: Payer: Self-pay | Admitting: Oncology

## 2011-03-26 NOTE — Discharge Summary (Signed)
[name], Weaver NO.:  [id number]   MEDICAL RECORD NO.:  [id number]          PATIENT TYPE:  INP   LOCATION:  6714                         FACILITY:  [facility]   PHYSICIAN:  Gordy Savers, MDDATE OF BIRTH:  02-22-1946   DATE OF ADMISSION:  04/09/2007  DATE OF DISCHARGE:  04/11/2007                               DISCHARGE SUMMARY   FINAL DIAGNOSES:  1. Profound microcytic anemia.  2. Lower gastrointestinal bleeding secondary to diverticular disease.   ADDITIONAL DIAGNOSIS:  Hiatal hernia with gastroesophageal reflux  disease.   DISCHARGE MEDICATIONS:  1. Protonix 40 mg daily.  2. Nu-Iron 150 mg b.i.d.   MEDICATIONS:  At time of discharge include hydrochlorothiazide 25 mg  daily.   HISTORY OF PRESENT ILLNESS:  The patient is a 65 year old African-  American female who presented with a 4-week history of fatigue.  Prior  to admission, she noted bright red stool per rectum and was seen by her  primary care physician, who noted severe anemia with a hemoglobin of 4.3  grams percent.  The patient was subsequently admitted for further  evaluation and treatment of her profound microcytic anemia.   LABORATORY DATA AND HOSPITAL COURSE:  The patient was admitted to the  hospital where she was supported with IV fluids.  She was transfused a  total of 4 units of packed RBCs without difficulty.  She was seen in  consultation by the GI service, who performed upper and lower  endoscopies.  EGD revealed mild gastritis, hiatal hernia and esophageal  stricture.  Colonoscopy revealed diverticulosis with active bleeding,  some internal hemorrhoids and a colonic polyp.  Hospital course was  uneventful.  The patient felt dramatically improved, after blood  transfusions.  Prior to her discharge, hemoglobin was 9.2 grams %.  Chemistries were unremarkable with glucose of 118, albumin of 3.2, H&H  on admission:  4.1, 14.4.  MCV was 64.8.  Iron was 12 with a TIBC of  428,  percent saturation was 3.  Ferritin was less than 1.   DISPOSITION:  The patient was discharged today to follow up with her  primary care Kathryn Weaver in 48 hours.  A repeat H&H will be obtained.  She  will be discharged on iron and Protonix, and her hydrochlorothiazide  held.  She has been asked to modify her activities.  She will return to  work after clearance by her primary care Kathryn Weaver.   CONDITION ON DISCHARGE:  Improved.      Gordy Savers, MD  Electronically Signed    PFK/MEDQ  D:  04/11/2007  T:  04/11/2007  Job:  [id number]

## 2011-03-29 NOTE — H&P (Signed)
   NAMETWILLA, Kathryn Weaver                          ACCOUNT NO.:  [id number]   MEDICAL RECORD NO.:  [id number]                   PATIENT TYPE:  AMB   LOCATION:  SDC                                  FACILITY:  [facility]   PHYSICIAN:  Duke Salvia. Marcelle Overlie, M.D.            DATE OF BIRTH:  Jun 16, 1946   DATE OF ADMISSION:  DATE OF DISCHARGE:                                HISTORY & PHYSICAL   DATE OF SURGERY:  August 05, 2003   CHIEF COMPLAINT:  Postmenopausal bleeding.   HISTORY OF PRESENT ILLNESS:  A 65 year old G3 P3 postmenopausal patient  referred by Dr. Milinda Antis in early August after an episode of postmenopausal  bleeding.  Ultrasound at [facility] had shown increased endometrial thickening.  She underwent endometrial biopsy June 28, 2003 which showed endometrial  polyp with degenerative change and simple hyperplasia without atypia.  Because of the concern about the polyp she underwent sonohysterogram that  showed a fairly well defined anterior polyp and a small posterior polyp.  She presents now for [facility] hysteroscopy.  This procedure including risks of  bleeding, infection, other complications that may require additional surgery  all reviewed with her which she understands and accepts.   PAST MEDICAL HISTORY:  1. Allergies:  None.  2. Operations:  None.   OBSTETRICAL HISTORY:  Three vaginal deliveries at term.   FAMILY HISTORY:  Significant for mother with hypertension; otherwise  unremarkable.   SURGICAL HISTORY:  She has had a prior tubal.   PHYSICAL EXAMINATION:  VITAL SIGNS:  Temperature 98.2, blood pressure  160/86.  HEENT:  Unremarkable.  NECK:  Supple without masses.  LUNGS:  Clear.  CARDIOVASCULAR:  Regular rate and rhythm without murmurs, rubs, or gallops  noted.  BREASTS:  Without masses.  ABDOMEN:  Soft, flat, and nontender.  PELVIC:  Normal external genitalia, vagina and cervix clear.  Uterus  midposition, normal size.  Adnexa negative.  EXTREMITIES AND NEUROLOGIC:   Unremarkable.    IMPRESSION:  1. Postmenopausal bleeding.  2. Evidence of endometrial polyps on sonohysterogram.   PLAN:  D&C hysteroscopy.  Procedure and risks reviewed as above.                                               Richard M. Marcelle Overlie, M.D.    [facility]/MEDQ  D:  08/04/2003  Kathryn:  08/04/2003  Job:  [id number]

## 2011-03-29 NOTE — Op Note (Signed)
   Kathryn Weaver, Kathryn Weaver                          ACCOUNT NO.:  [id number]   MEDICAL RECORD NO.:  [id number]                   PATIENT TYPE:  AMB   LOCATION:  SDC                                  FACILITY:  [facility]   PHYSICIAN:  Duke Salvia. Marcelle Overlie, M.D.            DATE OF BIRTH:  1946/03/13   DATE OF PROCEDURE:  08/05/2003  DATE OF DISCHARGE:                                 OPERATIVE REPORT   PREOPERATIVE DIAGNOSES:  1. Postmenopausal bleeding.  2. Endometrial polyps by sonohysterogram.   POSTOPERATIVE DIAGNOSES:  1. Postmenopausal bleeding.  2. Endometrial polyps by sonohysterogram.   PROCEDURE:  Dilatation and curettage hysteroscopy.   SURGEON:  Duke Salvia. Marcelle Overlie, M.D.   ANESTHESIA:  Sedation plus paracervical block.   ESTIMATED BLOOD LOSS:  Less than 5 mL.   PROCEDURE AND FINDINGS:  The patient was taken to the operating room.  After  an adequate level of mask anesthesia with sedation was obtained, the  patient's legs were placed in the stirrups.  The perineum and vagina were  prepped and draped in the usual manner for D&C.  The bladder was drained.  EUA carried out.  The uterus was mid position, normal size.  Adnexa  negative.  The cervix grasped with a tenaculum was sounded to 10 cm,  progressively dilated to a 29 Pratt dilator.  The 7-mm continuous flow  diagnostic hysteroscope was then inserted.  It was noted on the SHT there  was a posterior wall polyp and some irregularity on the anterior wall.  Polyp forceps was used to remove a polyp by itself and then a D&C was  carried out revealing minimal tissue.  Both were submitted to pathology.   The hysteroscope was then reinserted.  The cavity was irrigated and noted to  be normal.  There was no residual abnormal tissue.  The procedure was  discontinued at that point.  She tolerated this well, went to the recovery  room in good condition.                                               Richard M. Marcelle Overlie, M.D.    [facility]/MEDQ  D:  08/05/2003  T:  08/06/2003  Job:  [id number]

## 2011-05-27 ENCOUNTER — Ambulatory Visit: Payer: Self-pay | Admitting: Oncology

## 2011-06-12 ENCOUNTER — Ambulatory Visit: Payer: Self-pay | Admitting: Oncology

## 2011-07-13 HISTORY — PX: OTHER SURGICAL HISTORY: SHX169

## 2011-07-29 ENCOUNTER — Emergency Department (HOSPITAL_COMMUNITY): Payer: BC Managed Care – PPO

## 2011-07-29 ENCOUNTER — Observation Stay (HOSPITAL_COMMUNITY)
Admission: EM | Admit: 2011-07-29 | Discharge: 2011-07-30 | Disposition: A | Discharge status: Home / Self Care | Payer: BC Managed Care – PPO | Source: Ambulatory Visit | Attending: Emergency Medicine | Admitting: Emergency Medicine

## 2011-07-29 DIAGNOSIS — R079 Chest pain, unspecified: Principal | ICD-10-CM | POA: Insufficient documentation

## 2011-07-29 HISTORY — DX: Essential (primary) hypertension: I10

## 2011-07-29 LAB — DIFFERENTIAL
Basophils Absolute: 0 10*3/uL (ref 0.0–0.1)
Eosinophils Absolute: 0 10*3/uL (ref 0.0–0.7)
Eosinophils Relative: 1 % (ref 0–5)
Lymphocytes Relative: 20 % (ref 12–46)
Lymphs Abs: 0.8 10*3/uL (ref 0.7–4.0)
Neutrophils Relative %: 71 % (ref 43–77)

## 2011-07-29 LAB — CBC
MCV: 96 fL (ref 78.0–100.0)
Platelets: 110 10*3/uL — ABNORMAL LOW (ref 150–400)
RBC: 4.01 MIL/uL (ref 3.87–5.11)
RDW: 12.9 % (ref 11.5–15.5)
WBC: 4.1 10*3/uL (ref 4.0–10.5)

## 2011-07-29 LAB — COMPREHENSIVE METABOLIC PANEL
ALT: 18 U/L (ref 0–35)
AST: 22 U/L (ref 0–37)
Albumin: 3.8 g/dL (ref 3.5–5.2)
Calcium: 10.8 mg/dL — ABNORMAL HIGH (ref 8.4–10.5)
Creatinine, Ser: 0.79 mg/dL (ref 0.50–1.10)
Sodium: 140 mEq/L (ref 135–145)
Total Protein: 8 g/dL (ref 6.0–8.3)

## 2011-07-29 LAB — POCT I-STAT TROPONIN I: Troponin i, poc: 0 ng/mL (ref 0.00–0.08)

## 2011-07-29 LAB — CK TOTAL AND CKMB (NOT AT ARMC)
CK, MB: 2.2 ng/mL (ref 0.3–4.0)
Relative Index: INVALID (ref 0.0–2.5)

## 2011-07-30 ENCOUNTER — Observation Stay (HOSPITAL_COMMUNITY): Payer: BC Managed Care – PPO

## 2011-07-30 ENCOUNTER — Encounter (HOSPITAL_COMMUNITY): Payer: Self-pay | Admitting: Radiology

## 2011-07-30 MED ORDER — IOHEXOL 350 MG/ML SOLN
80.0000 mL | Freq: Once | INTRAVENOUS | Status: AC | PRN
  Administered 2011-07-30: 80 mL via INTRAVENOUS

## 2011-08-02 ENCOUNTER — Ambulatory Visit (INDEPENDENT_AMBULATORY_CARE_PROVIDER_SITE_OTHER): Payer: BC Managed Care – PPO | Admitting: Family Medicine

## 2011-08-02 ENCOUNTER — Encounter: Payer: Self-pay | Admitting: Family Medicine

## 2011-08-02 VITALS — BP 110/70 | HR 64 | Temp 98.1°F | Ht 62.75 in | Wt 175.8 lb

## 2011-08-02 DIAGNOSIS — R079 Chest pain, unspecified: Secondary | ICD-10-CM | POA: Insufficient documentation

## 2011-08-02 DIAGNOSIS — K219 Gastro-esophageal reflux disease without esophagitis: Secondary | ICD-10-CM

## 2011-08-02 MED ORDER — RANITIDINE HCL 150 MG PO CAPS: 150.0000 mg | ORAL_CAPSULE | Freq: Two times a day (BID) | ORAL | Status: DC

## 2011-08-02 NOTE — Progress Notes (Signed)
Subjective:    Patient ID: Kathryn Weaver, female    DOB: 04-27-1946, 65 y.o.   MRN: [id number]  HPI Here for f/u of cp that is thought to be from gerd Was in the ER with cp that was relieved by antacids/ burping/ zantac   Was admit for obs and had nl cardiac CT neg for CAD  Also r/o MI and nl cxr   No more cp since she left the hospital  Did take zantac and it did give relief times one  No epigastric pain  No heartburn   Appetite is good  Does tend to overeat Does eats at night  No spicy food   The day this happened had eaten onion on green beans and rice and boiled and fried chicken  Pain was in anterior chest  Relieved by burping   Wt is up 4 lb   Patient Active Problem List  Diagnoses  . ANEMIA, MACROCYTIC  . ANEMIA NOS  . THROMBOCYTOPENIA  . DEPRESSION  . HYPERTENSION  . GERD  . DIVERTICULAR DISEASE  . POSTMENOPAUSAL STATUS  . TRANSAMINASES, SERUM, ELEVATED  . HX, PERSONAL, COLONIC POLYPS  . LEUKOCYTOPENIA UNSPECIFIED  . Chest pain   Past Medical History  Diagnosis Date  . Hypertension   . Anemia     s/p GI bleed, hospital 5/08  . Colon polyps   . GERD (gastroesophageal reflux disease)   . Hiatal hernia     EGD 5/08- stricture, HH, gastritis, GERD  . Depression   . Hypertension   . Endometriosis     uterine US 06/2003   Past Surgical History  Procedure Date  . Tubal ligation   . Cardiac ct 9/12    normal    History  Substance Use Topics  . Smoking status: Never Smoker   . Smokeless tobacco: Not on file  . Alcohol Use: No   Family History  Problem Relation Age of Onset  . Hypertension Mother   . Cancer Father     unknown type   No Known Allergies Current Outpatient Prescriptions on File Prior to Visit  Medication Sig Dispense Refill  . hydrochlorothiazide 25 MG tablet TAKE 1 TABLET EVERY DAY  90 tablet  3       Review of Systems Review of Systems  Constitutional: Negative for fever, appetite change, fatigue and unexpected weight  change.  Eyes: Negative for pain and visual disturbance.  Respiratory: Negative for cough and shortness of breath.   Cardiovascular: Negative for cp or palpitations   (cp is relieved) Gastrointestinal: Negative for nausea, diarrhea and constipation. pos for occas heartburn Genitourinary: Negative for urgency and frequency.  Skin: Negative for pallor or rash   Neurological: Negative for weakness, light-headedness, numbness and headaches.  Hematological: Negative for adenopathy. Does not bruise/bleed easily.  Psychiatric/Behavioral: Negative for dysphoric mood. The patient is not nervous/anxious.          Objective:   Physical Exam  Constitutional: She appears well-developed and well-nourished. No distress.  HENT:  Head: Normocephalic and atraumatic.  Eyes: Conjunctivae and EOM are normal. Pupils are equal, round, and reactive to light. No scleral icterus.  Neck: Normal range of motion. Neck supple. No JVD present. No thyromegaly present.  Cardiovascular: Normal rate, regular rhythm, normal heart sounds and intact distal pulses.   Pulmonary/Chest: Effort normal and breath sounds normal. No respiratory distress. She has no wheezes. She exhibits no tenderness.  Abdominal: Soft. Bowel sounds are normal. She exhibits no distension, no abdominal bruit  and no mass. There is no tenderness.  Musculoskeletal: Normal range of motion. She exhibits no edema and no tenderness.  Lymphadenopathy:    She has no cervical adenopathy.  Neurological: She is alert. She has normal reflexes. Coordination normal.  Skin: Skin is warm and dry. No rash noted. No erythema. No pallor.  Psychiatric: She has a normal mood and affect.          Assessment & Plan:

## 2011-08-02 NOTE — Patient Instructions (Signed)
Start zantac 150 mg twice daily for 3 months If you have chest pain or acid reflux symptoms let me know  if symptoms return after stopping medicine - let me know  Weight loss will help  Don't overeat/ don't eat at night and avoid spicy foods and alcohol / careful with coffee

## 2011-08-02 NOTE — Assessment & Plan Note (Signed)
GERD- sent her to hosp with atypical chest pain  Will tx with zantac 150 bid for 3 mo - update if breakthrough symptoms Disc plan for wt loss and also diet change

## 2011-08-02 NOTE — Assessment & Plan Note (Signed)
With negative cardiac w/u in ER and hosp Re assuring cardiac CT Disc minimizing risk factors No more cp Suspect it was from acid reflux Is working on wt loss program

## 2011-08-15 ENCOUNTER — Other Ambulatory Visit: Payer: Self-pay | Admitting: Family Medicine

## 2011-08-15 NOTE — Telephone Encounter (Signed)
[facility] request refill Sertraline 50 mg. Last refill 06/05/11.

## 2011-08-15 NOTE — Telephone Encounter (Signed)
Will refill electronically  

## 2011-08-22 ENCOUNTER — Ambulatory Visit: Payer: Self-pay | Admitting: Oncology

## 2011-09-12 ENCOUNTER — Ambulatory Visit: Payer: Self-pay | Admitting: Oncology

## 2011-10-01 ENCOUNTER — Other Ambulatory Visit: Payer: Self-pay | Admitting: Family Medicine

## 2011-10-01 DIAGNOSIS — Z1231 Encounter for screening mammogram for malignant neoplasm of breast: Secondary | ICD-10-CM

## 2011-11-08 ENCOUNTER — Ambulatory Visit
Admission: RE | Admit: 2011-11-08 | Discharge: 2011-11-08 | Disposition: A | Discharge status: Home / Self Care | Payer: BC Managed Care – PPO | Source: Ambulatory Visit | Attending: Family Medicine | Admitting: Family Medicine

## 2011-11-08 DIAGNOSIS — Z1231 Encounter for screening mammogram for malignant neoplasm of breast: Secondary | ICD-10-CM

## 2011-11-20 ENCOUNTER — Telehealth: Payer: Self-pay | Admitting: Internal Medicine

## 2011-11-20 NOTE — Telephone Encounter (Signed)
Patient called and stated she has a deep cough, coughing up clear mucous and no energy.  She has no fever, no sore throat, She has tried Mucinex, Robutissin and Coricidin and can't seem to get rid of the cough.  [facility] [address]

## 2011-11-20 NOTE — Telephone Encounter (Signed)
Follow up with first availible- if sob/ wheeze after hours go to UC or ER I do recommend mucinex DM for cough

## 2011-11-21 NOTE — Telephone Encounter (Signed)
Patient notified as instructed by telephone. Pt does not want appt yet she will continue OTC and if symptoms worsen or cough does not go away pt will call for appt.

## 2012-01-21 ENCOUNTER — Ambulatory Visit (INDEPENDENT_AMBULATORY_CARE_PROVIDER_SITE_OTHER): Payer: BC Managed Care – PPO | Admitting: Family Medicine

## 2012-01-21 ENCOUNTER — Other Ambulatory Visit: Payer: Self-pay | Admitting: *Deleted

## 2012-01-21 ENCOUNTER — Encounter: Payer: Self-pay | Admitting: Family Medicine

## 2012-01-21 VITALS — BP 124/80 | HR 60 | Temp 97.9°F | Wt 178.0 lb

## 2012-01-21 DIAGNOSIS — L292 Pruritus vulvae: Secondary | ICD-10-CM

## 2012-01-21 DIAGNOSIS — R300 Dysuria: Secondary | ICD-10-CM

## 2012-01-21 DIAGNOSIS — R82998 Other abnormal findings in urine: Secondary | ICD-10-CM

## 2012-01-21 DIAGNOSIS — L293 Anogenital pruritus, unspecified: Secondary | ICD-10-CM

## 2012-01-21 LAB — POCT URINALYSIS DIPSTICK
Ketones, UA: NEGATIVE
Nitrite, UA: NEGATIVE
Protein, UA: NEGATIVE
Urobilinogen, UA: NEGATIVE
pH, UA: 7

## 2012-01-21 MED ORDER — SERTRALINE HCL 50 MG PO TABS: ORAL_TABLET | ORAL | Status: DC

## 2012-01-21 MED ORDER — FLUCONAZOLE 150 MG PO TABS: 150.0000 mg | ORAL_TABLET | Freq: Once | ORAL | Status: AC

## 2012-01-21 NOTE — Progress Notes (Signed)
SUBJECTIVE: Kathryn Weaver is a 66 y.o. female who complains of vulvular itching for past 3 days. Denies any urinary frequency, urgency, dysuria, flank pain, fever, chills, or abnormal vaginal discharge or bleeding.   She was treated for Ear infection with antibiotic one week ago at [facility].  She cannot remember name of antibiotic.  Has tried Azo OTC with no relief of symptoms.  Patient Active Problem List  Diagnoses  . ANEMIA, MACROCYTIC  . ANEMIA NOS  . THROMBOCYTOPENIA  . DEPRESSION  . HYPERTENSION  . GERD  . DIVERTICULAR DISEASE  . POSTMENOPAUSAL STATUS  . TRANSAMINASES, SERUM, ELEVATED  . HX, PERSONAL, COLONIC POLYPS  . LEUKOCYTOPENIA UNSPECIFIED  . Chest pain   Past Medical History  Diagnosis Date  . Hypertension   . Anemia     s/p GI bleed, hospital 5/08  . Colon polyps   . GERD (gastroesophageal reflux disease)   . Hiatal hernia     EGD 5/08- stricture, HH, gastritis, GERD  . Depression   . Hypertension   . Endometriosis     uterine US 06/2003   Past Surgical History  Procedure Date  . Tubal ligation   . Cardiac ct 9/12    normal    History  Substance Use Topics  . Smoking status: Never Smoker   . Smokeless tobacco: Not on file  . Alcohol Use: No   Family History  Problem Relation Age of Onset  . Hypertension Mother   . Cancer Father     unknown type   No Known Allergies Current Outpatient Prescriptions on File Prior to Visit  Medication Sig Dispense Refill  . Ferrous Sulfate (SLOW FE PO) One tablet by mouth occasionally       . hydrochlorothiazide 25 MG tablet TAKE 1 TABLET EVERY DAY  90 tablet  3  . Multiple Vitamins-Minerals (CENTRUM PO) Take one by mouth daily       . ranitidine (ZANTAC) 150 MG capsule Take 1 capsule (150 mg total) by mouth 2 (two) times daily. For 3 months  60 capsule  2  . sertraline (ZOLOFT) 50 MG tablet TAKE 1 TABLET EVERY DAY  30 tablet  11  . triamcinolone (KENALOG) 0.025 % cream Apply topically 2 (two) times daily.          The PMH, PSH, Social History, Family History, Medications, and allergies have been reviewed in [facility], and have been updated if relevant.  OBJECTIVE:  BP 124/80  Pulse 60  Temp(Src) 97.9 F (36.6 C) (Oral)  Wt 178 lb (80.74 kg)  Appears well, in no apparent distress.  Vital signs are normal. The abdomen is soft without tenderness, guarding, mass, rebound or organomegaly. No CVA tenderness or inguinal adenopathy noted. Urine dipstick shows positive for leukocytes.    ASSESSMENT/PLAN:  1.  Vulvular itching- most likely due to vaginal/vulvular yeast infection secondary to abx. Will treat with diflucan 150 mg po x 1.  2.  Leukocytes in urine- send urine for cx to rule out UTI. Symptoms do not appear urinary at this point. The patient indicates understanding of these issues and agrees with the plan.

## 2012-01-21 NOTE — Telephone Encounter (Signed)
Please schedule f/u if not one upcoming Will refill electronically

## 2012-01-21 NOTE — Patient Instructions (Signed)
Nice to meet you. I think you likely have a vaginal yeast infection. I have sent diflucan- a one time pill to your pharmacy which should help with the itching. I am also sending your urine for culture- meaning to see if any bacteria will grow. We will call you in 48 hours. Please call us before that if your symptoms are not improving.

## 2012-01-21 NOTE — Telephone Encounter (Signed)
Patient not seen for cpx in over 1 year. Please advise?

## 2012-01-22 NOTE — Telephone Encounter (Signed)
[name] please schedule f/u appt. Thank you. Kathryn Weaver

## 2012-01-23 ENCOUNTER — Other Ambulatory Visit: Payer: Self-pay | Admitting: *Deleted

## 2012-01-23 LAB — URINE CULTURE: Colony Count: >=100000

## 2012-01-23 MED ORDER — CEPHALEXIN 500 MG PO CAPS: ORAL_CAPSULE | ORAL | Status: DC

## 2012-01-23 NOTE — Telephone Encounter (Signed)
[facility] Dr[facility] Dayton Martes, see urine culture result note from today.

## 2012-01-27 ENCOUNTER — Encounter: Payer: Self-pay | Admitting: Gastroenterology

## 2012-01-28 NOTE — Telephone Encounter (Signed)
Spoke with pt and scheduled appt °

## 2012-02-05 ENCOUNTER — Encounter: Payer: Self-pay | Admitting: Family Medicine

## 2012-02-05 ENCOUNTER — Ambulatory Visit (INDEPENDENT_AMBULATORY_CARE_PROVIDER_SITE_OTHER): Payer: BC Managed Care – PPO | Admitting: Family Medicine

## 2012-02-05 VITALS — BP 124/82 | HR 72 | Temp 97.8°F | Ht 62.75 in | Wt 180.2 lb

## 2012-02-05 DIAGNOSIS — I10 Essential (primary) hypertension: Secondary | ICD-10-CM

## 2012-02-05 DIAGNOSIS — F329 Major depressive disorder, single episode, unspecified: Secondary | ICD-10-CM

## 2012-02-05 DIAGNOSIS — F3289 Other specified depressive episodes: Secondary | ICD-10-CM

## 2012-02-05 DIAGNOSIS — K219 Gastro-esophageal reflux disease without esophagitis: Secondary | ICD-10-CM

## 2012-02-05 DIAGNOSIS — E669 Obesity, unspecified: Secondary | ICD-10-CM

## 2012-02-05 MED ORDER — SERTRALINE HCL 50 MG PO TABS: ORAL_TABLET | ORAL | Status: DC

## 2012-02-05 MED ORDER — HYDROCHLOROTHIAZIDE 25 MG PO TABS: 25.0000 mg | ORAL_TABLET | Freq: Every day | ORAL | Status: DC

## 2012-02-05 NOTE — Assessment & Plan Note (Signed)
Overall stable - zoloft works well  That was refilled today

## 2012-02-05 NOTE — Patient Instructions (Signed)
Schedule annual exam in June or early July with labs prior I sent px to the pharmacy  Work on weight loss Exercise 5 days per week for 30 minutes Join weight watchers  Stop the zantac - but if symptoms return - start it again

## 2012-02-05 NOTE — Assessment & Plan Note (Signed)
bp in fair control at this time  No changes needed  Disc lifstyle change with low sodium diet and exercise   

## 2012-02-05 NOTE — Assessment & Plan Note (Signed)
Discussed how this problem influences overall health and the risks it imposes  Reviewed plan for weight loss with lower calorie diet (via better food choices and also portion control or program like weight watchers) and exercise building up to or more than 30 minutes 5 days per week including some aerobic activity    

## 2012-02-05 NOTE — Progress Notes (Signed)
Subjective:    Patient ID: Kathryn Weaver, female    DOB: 1946-03-22, 66 y.o.   MRN: [id number]  HPI Here for f/u of chronic conditions  Is feeling well overall   Had GERD causing cp  Neg cardiac w/u On zantac for over 3 mo - and has not had another attack of cp or heartburn  Wt is up 2 lb with bmi of 32 Diet is not good overall - is bad , eats too much , eating too much fried foods , and snacks late at night   Not exercising  Is motivated to start walking - can start in house , and can start video  Has been on weight watchers in past - may do that    Lab Results  Component Value Date   WBC 4.1 07/29/2011   HGB 13.0 07/29/2011   HCT 38.5 07/29/2011   MCV 96.0 07/29/2011   PLT 110* 07/29/2011      bp is  124/82   Today No cp or palpitations or headaches or edema  No side effects to medicines    Patient Active Problem List  Diagnoses  . ANEMIA, MACROCYTIC  . ANEMIA NOS  . THROMBOCYTOPENIA  . DEPRESSION  . HYPERTENSION  . GERD  . DIVERTICULAR DISEASE  . POSTMENOPAUSAL STATUS  . TRANSAMINASES, SERUM, ELEVATED  . HX, PERSONAL, COLONIC POLYPS  . LEUKOCYTOPENIA UNSPECIFIED  . Chest pain  . Obesity   Past Medical History  Diagnosis Date  . Hypertension   . Anemia     s/p GI bleed, hospital 5/08  . Colon polyps   . GERD (gastroesophageal reflux disease)   . Hiatal hernia     EGD 5/08- stricture, HH, gastritis, GERD  . Depression   . Hypertension   . Endometriosis     uterine US 06/2003   Past Surgical History  Procedure Date  . Tubal ligation   . Cardiac ct 9/12    normal    History  Substance Use Topics  . Smoking status: Never Smoker   . Smokeless tobacco: Not on file  . Alcohol Use: No   Family History  Problem Relation Age of Onset  . Hypertension Mother   . Cancer Father     unknown type   No Known Allergies Current Outpatient Prescriptions on File Prior to Visit  Medication Sig Dispense Refill  . Ferrous Sulfate (SLOW FE PO) One tablet by  mouth occasionally       . Multiple Vitamins-Minerals (CENTRUM PO) Take one by mouth daily       . DISCONTD: ranitidine (ZANTAC) 150 MG capsule Take 1 capsule (150 mg total) by mouth 2 (two) times daily. For 3 months  60 capsule  2      Review of Systems Review of Systems  Constitutional: Negative for fever, appetite change, fatigue and unexpected weight change.  Eyes: Negative for pain and visual disturbance.  Respiratory: Negative for cough and shortness of breath.   Cardiovascular: Negative for cp or palpitations    Gastrointestinal: Negative for nausea, diarrhea and constipation. neg for heartburn or indigestion Genitourinary: Negative for urgency and frequency.  Skin: Negative for pallor or rash   Neurological: Negative for weakness, light-headedness, numbness and headaches.  Hematological: Negative for adenopathy. Does not bruise/bleed easily.  Psychiatric/Behavioral: Negative for dysphoric mood. The patient is not nervous/anxious.          Objective:   Physical Exam  Constitutional: She appears well-developed and well-nourished. No distress.  HENT:  Head: Normocephalic and atraumatic.  Mouth/Throat: Oropharynx is clear and moist.  Eyes: Conjunctivae and EOM are normal. Pupils are equal, round, and reactive to light. No scleral icterus.  Neck: Normal range of motion. Neck supple. No JVD present. Carotid bruit is not present. No thyromegaly present.  Cardiovascular: Normal rate, regular rhythm, normal heart sounds and intact distal pulses.  Exam reveals no gallop.   Pulmonary/Chest: Effort normal and breath sounds normal. No respiratory distress. She has no wheezes.  Abdominal: Soft. Bowel sounds are normal. She exhibits no distension and no mass. There is no tenderness.  Musculoskeletal: Normal range of motion. She exhibits no edema and no tenderness.  Lymphadenopathy:    She has no cervical adenopathy.  Neurological: She is alert. She has normal reflexes. No cranial nerve  deficit. She exhibits normal muscle tone. Coordination normal.  Skin: Skin is warm and dry. No rash noted. No erythema. No pallor.  Psychiatric: She has a normal mood and affect.          Assessment & Plan:

## 2012-02-05 NOTE — Assessment & Plan Note (Signed)
Much improved overall - no more cp or heartburn Disc imp of wt loss to help this  Will try getting off the zantac and if symptoms re occur- call and stop it  Disc diet in detail

## 2012-02-21 ENCOUNTER — Ambulatory Visit: Payer: Self-pay | Admitting: Oncology

## 2012-02-21 LAB — BASIC METABOLIC PANEL
BUN: 11 mg/dL (ref 7–18)
Co2: 32 mmol/L (ref 21–32)
Creatinine: 0.84 mg/dL (ref 0.60–1.30)
Glucose: 103 mg/dL — ABNORMAL HIGH (ref 65–99)
Potassium: 4 mmol/L (ref 3.5–5.1)

## 2012-02-21 LAB — CBC CANCER CENTER
Basophil #: 0 x10 3/mm (ref 0.0–0.1)
Basophil %: 0.8 %
Eosinophil #: 0 x10 3/mm (ref 0.0–0.7)
Eosinophil %: 1.2 %
HGB: 13.6 g/dL (ref 12.0–16.0)
MCH: 31.6 pg (ref 26.0–34.0)
Monocyte #: 0.3 x10 3/mm (ref 0.2–0.9)
Neutrophil #: 1.1 x10 3/mm — ABNORMAL LOW (ref 1.4–6.5)
Neutrophil %: 43.2 %
RDW: 13.4 % (ref 11.5–14.5)
WBC: 2.5 x10 3/mm — ABNORMAL LOW (ref 3.6–11.0)

## 2012-02-24 LAB — KAPPA/LAMBDA FREE LIGHT CHAINS (ARMC)

## 2012-03-11 ENCOUNTER — Ambulatory Visit: Payer: Self-pay | Admitting: Oncology

## 2012-03-17 ENCOUNTER — Other Ambulatory Visit: Payer: Self-pay | Admitting: Family Medicine

## 2012-03-17 NOTE — Telephone Encounter (Signed)
Will refill electronically  

## 2012-04-20 ENCOUNTER — Telehealth: Payer: Self-pay | Admitting: Family Medicine

## 2012-04-20 DIAGNOSIS — Z0000 Encounter for general adult medical examination without abnormal findings: Secondary | ICD-10-CM

## 2012-04-20 NOTE — Telephone Encounter (Signed)
Message copied by Judy Pimple on Mon Apr 20, 2012  9:35 PM ------      Message from: Baldomero Lamy      Created: Tue Apr 14, 2012  8:28 AM      Regarding: Cpx labs Tues 6/11       Please order  future cpx labs for pt's upcomming lab appt.      Thanks      Rodney Booze

## 2012-04-21 ENCOUNTER — Other Ambulatory Visit (INDEPENDENT_AMBULATORY_CARE_PROVIDER_SITE_OTHER): Payer: BC Managed Care – PPO

## 2012-04-21 ENCOUNTER — Other Ambulatory Visit: Payer: Self-pay | Admitting: Dermatology

## 2012-04-21 DIAGNOSIS — Z0000 Encounter for general adult medical examination without abnormal findings: Secondary | ICD-10-CM

## 2012-04-21 LAB — COMPREHENSIVE METABOLIC PANEL
ALT: 35 U/L (ref 0–35)
Albumin: 3.5 g/dL (ref 3.5–5.2)
CO2: 31 mEq/L (ref 19–32)
Calcium: 10.2 mg/dL (ref 8.4–10.5)
Chloride: 107 mEq/L (ref 96–112)
Creatinine, Ser: 0.9 mg/dL (ref 0.4–1.2)
GFR: 82.96 mL/min (ref >60.00)
Potassium: 4.3 mEq/L (ref 3.5–5.1)
Total Protein: 7.3 g/dL (ref 6.0–8.3)

## 2012-04-21 LAB — CBC WITH DIFFERENTIAL/PLATELET
Basophils Absolute: 0 10*3/uL (ref 0.0–0.1)
Basophils Relative: 0.6 % (ref 0.0–3.0)
Hemoglobin: 13.2 g/dL (ref 12.0–15.0)
Lymphocytes Relative: 46.1 % — ABNORMAL HIGH (ref 12.0–46.0)
Monocytes Relative: 14 % — ABNORMAL HIGH (ref 3.0–12.0)
Neutro Abs: 1.3 10*3/uL — ABNORMAL LOW (ref 1.4–7.7)
RBC: 4.2 Mil/uL (ref 3.87–5.11)
RDW: 13.5 % (ref 11.5–14.6)

## 2012-04-21 LAB — LDL CHOLESTEROL, DIRECT: Direct LDL: 156 mg/dL

## 2012-04-21 LAB — LIPID PANEL: HDL: 21.1 mg/dL — ABNORMAL LOW (ref >39.00)

## 2012-04-28 ENCOUNTER — Encounter: Payer: Self-pay | Admitting: Family Medicine

## 2012-04-28 ENCOUNTER — Ambulatory Visit (INDEPENDENT_AMBULATORY_CARE_PROVIDER_SITE_OTHER): Payer: BC Managed Care – PPO | Admitting: Family Medicine

## 2012-04-28 ENCOUNTER — Encounter: Payer: Self-pay | Admitting: Gastroenterology

## 2012-04-28 VITALS — BP 112/68 | HR 56 | Temp 97.8°F | Ht 62.75 in | Wt 178.0 lb

## 2012-04-28 DIAGNOSIS — I10 Essential (primary) hypertension: Secondary | ICD-10-CM

## 2012-04-28 DIAGNOSIS — Z860100 Personal history of colon polyps, unspecified: Secondary | ICD-10-CM

## 2012-04-28 DIAGNOSIS — Z0000 Encounter for general adult medical examination without abnormal findings: Secondary | ICD-10-CM

## 2012-04-28 DIAGNOSIS — E785 Hyperlipidemia, unspecified: Secondary | ICD-10-CM | POA: Insufficient documentation

## 2012-04-28 DIAGNOSIS — D696 Thrombocytopenia, unspecified: Secondary | ICD-10-CM

## 2012-04-28 DIAGNOSIS — D72819 Decreased white blood cell count, unspecified: Secondary | ICD-10-CM

## 2012-04-28 NOTE — Assessment & Plan Note (Signed)
Stable and followed by heme Labs reviewed

## 2012-04-28 NOTE — Patient Instructions (Addendum)
Avoid red meat/ fried foods/ egg yolks/ fatty breakfast meats/ butter, cheese and high fat dairy/ and shellfish   Schedule fasting lab in 3 months for cholesterol  If you change your mind about vaccines please let me know We will refer you for colonoscopy at check out

## 2012-04-28 NOTE — Assessment & Plan Note (Signed)
Followed by hematology- pl ct 126 today  No bruising/ bleeding or other problems  Rev labs with pt

## 2012-04-28 NOTE — Assessment & Plan Note (Signed)
Ref for 5 year recall colonosc  Pt does have low platelets - that will need to be discussed

## 2012-04-28 NOTE — Assessment & Plan Note (Signed)
bp in fair control at this time  No changes needed  Disc lifstyle change with low sodium diet and exercise   Reviewed labs today   Chemistry      Component Value Date/Time   NA 143 04/21/2012 0843   K 4.3 04/21/2012 0843   CL 107 04/21/2012 0843   CO2 31 04/21/2012 0843   BUN 15 04/21/2012 0843   CREATININE 0.9 04/21/2012 0843      Component Value Date/Time   CALCIUM 10.2 04/21/2012 0843   ALKPHOS 77 04/21/2012 0843   AST 31 04/21/2012 0843   ALT 35 04/21/2012 0843   BILITOT 0.4 04/21/2012 1610

## 2012-04-28 NOTE — Assessment & Plan Note (Signed)
Lipids are up with suprisingly low HDL  Rev low sat fat diet and need for exercise Re check 3 mo

## 2012-04-28 NOTE — Progress Notes (Signed)
Subjective:    Patient ID: Kathryn Weaver, female    DOB: 30-Apr-1946, 66 y.o.   MRN: [id number]  HPI Here for health maintenance exam and to review chronic medical problems   Is doing very well- feels good  No new problems   Is taking good care of herself   Zoster status-never had shingles   mammo 12/12- was ok  Self exam-no lumps or changes   Pap 09-- does not see a gyn  No symptoms or problems at all  Never had abn pap smears   colonosc 1/08- due 5/13 for polyps [place]    Flu shot - did not get   bp is 112/68     Today No cp or palpitations or headaches or edema  No side effects to medicines    Wt is down 2 lb with bmi of 31  Cbc- platelets up  Lab Results  Component Value Date   WBC 3.5* 04/21/2012   HGB 13.2 04/21/2012   HCT 40.2 04/21/2012   MCV 95.8 04/21/2012   PLT 126.0* 04/21/2012   no anemia - takes iron  Platelets are best in a while No bleeding or bruising  Still goes to Dr Orlie Dakin every 6 months   Lipids Lab Results  Component Value Date   CHOL 234* 04/21/2012   CHOL 226* 10/22/2010   CHOL 254* 10/20/2009   Lab Results  Component Value Date   HDL 21.10* 04/21/2012   HDL 61.50 10/22/2010   HDL 44.10 10/20/2009   No results found for this basename: [facility]   Lab Results  Component Value Date   TRIG 86.0 04/21/2012   TRIG 45.0 10/22/2010   TRIG 69.0 10/20/2009   Lab Results  Component Value Date   CHOLHDL 11 04/21/2012   CHOLHDL 4 10/22/2010   CHOLHDL 6 10/20/2009   Lab Results  Component Value Date   LDLDIRECT 156.0 04/21/2012   LDLDIRECT 138.9 10/22/2010   LDLDIRECT 163.0 10/20/2009   ? Puzzled by HDL  Is walking  n fish oil   Lab Results  Component Value Date   VITAMINB12 900 10/22/2010    LDL is up  No red meat , more grilled or baked occ fried foods  Eats oatmeal and blueberries   Patient Active Problem List  Diagnosis  . ANEMIA, MACROCYTIC  . ANEMIA NOS  . THROMBOCYTOPENIA  . DEPRESSION  . HYPERTENSION  . GERD    . DIVERTICULAR DISEASE  . POSTMENOPAUSAL STATUS  . TRANSAMINASES, SERUM, ELEVATED  . HX, PERSONAL, COLONIC POLYPS  . LEUKOCYTOPENIA UNSPECIFIED  . Chest pain  . Obesity  . Routine general medical examination at a health care facility  . Hyperlipidemia   Past Medical History  Diagnosis Date  . Hypertension   . Anemia     s/p GI bleed, hospital 5/08  . Colon polyps   . GERD (gastroesophageal reflux disease)   . Hiatal hernia     EGD 5/08- stricture, HH, gastritis, GERD  . Depression   . Hypertension   . Endometriosis     uterine US 06/2003   Past Surgical History  Procedure Date  . Tubal ligation   . Cardiac ct 9/12    normal    History  Substance Use Topics  . Smoking status: Never Smoker   . Smokeless tobacco: Not on file  . Alcohol Use: No   Family History  Problem Relation Age of Onset  . Hypertension Mother   . Cancer Father     unknown  type   No Known Allergies Current Outpatient Prescriptions on File Prior to Visit  Medication Sig Dispense Refill  . Ferrous Sulfate (SLOW FE PO) One tablet by mouth occasionally       . hydrochlorothiazide (HYDRODIURIL) 25 MG tablet Take 1 tablet (25 mg total) by mouth daily.  90 tablet  3  . Multiple Vitamins-Minerals (CENTRUM PO) Take one by mouth daily       . sertraline (ZOLOFT) 50 MG tablet TAKE 1 TABLET EVERY DAY  30 tablet  11  . DISCONTD: ranitidine (ZANTAC) 150 MG capsule Take 1 capsule (150 mg total) by mouth 2 (two) times daily. For 3 months  60 capsule  2       Review of Systems Review of Systems  Constitutional: Negative for fever, appetite change, fatigue and unexpected weight change.  Eyes: Negative for pain and visual disturbance.  Respiratory: Negative for cough and shortness of breath.   Cardiovascular: Negative for cp or palpitations    Gastrointestinal: Negative for nausea, diarrhea and constipation.  Genitourinary: Negative for urgency and frequency.  Skin: Negative for pallor or rash    Neurological: Negative for weakness, light-headedness, numbness and headaches.  Hematological: Negative for adenopathy. Does not bruise/bleed easily.  Psychiatric/Behavioral: Negative for dysphoric mood. The patient is not nervous/anxious.         Objective:   Physical Exam  Constitutional: She appears well-developed and well-nourished. No distress.  HENT:  Head: Normocephalic and atraumatic.  Right Ear: External ear normal.  Left Ear: External ear normal.  Nose: Nose normal.  Mouth/Throat: Oropharynx is clear and moist.  Eyes: Conjunctivae and EOM are normal. Pupils are equal, round, and reactive to light. No scleral icterus.  Neck: Normal range of motion. No JVD present. Carotid bruit is not present. No thyromegaly present.  Cardiovascular: Normal rate, regular rhythm, normal heart sounds and intact distal pulses.  Exam reveals no gallop.   Pulmonary/Chest: Effort normal and breath sounds normal. No respiratory distress. She has no wheezes. She exhibits no tenderness.  Abdominal: Soft. Bowel sounds are normal. She exhibits no distension, no abdominal bruit and no mass. There is no tenderness.  Genitourinary: No breast swelling, tenderness, discharge or bleeding.       Breast exam: No mass, nodules, thickening, tenderness, bulging, retraction, inflamation, nipple discharge or skin changes noted.  No axillary or clavicular LA.  Chaperoned exam.    Musculoskeletal: Normal range of motion. She exhibits no edema and no tenderness.  Lymphadenopathy:    She has no cervical adenopathy.  Neurological: She is alert. She has normal reflexes. No cranial nerve deficit. She exhibits normal muscle tone. Coordination normal.  Skin: Skin is warm and dry. No rash noted. No erythema. No pallor.  Psychiatric: She has a normal mood and affect.          Assessment & Plan:

## 2012-04-28 NOTE — Assessment & Plan Note (Signed)
Reviewed health habits including diet and exercise and skin cancer prevention Also reviewed health mt list, fam hx and immunizations  Rev wellness labs Doing well with diet and exercise

## 2012-06-23 ENCOUNTER — Ambulatory Visit (AMBULATORY_SURGERY_CENTER): Payer: Medicare Other

## 2012-06-23 VITALS — Ht 62.0 in | Wt 174.1 lb

## 2012-06-23 DIAGNOSIS — Z1211 Encounter for screening for malignant neoplasm of colon: Secondary | ICD-10-CM

## 2012-06-23 MED ORDER — MOVIPREP 100 G PO SOLR: 1.0000 | Freq: Once | ORAL | Status: DC

## 2012-07-07 ENCOUNTER — Encounter: Payer: Self-pay | Admitting: Gastroenterology

## 2012-07-07 ENCOUNTER — Ambulatory Visit (AMBULATORY_SURGERY_CENTER): Payer: Medicare Other | Admitting: Gastroenterology

## 2012-07-07 VITALS — BP 124/88 | HR 56 | Temp 97.8°F | Resp 19 | Ht 64.0 in | Wt 178.0 lb

## 2012-07-07 DIAGNOSIS — Z86010 Personal history of colonic polyps: Secondary | ICD-10-CM

## 2012-07-07 DIAGNOSIS — D126 Benign neoplasm of colon, unspecified: Secondary | ICD-10-CM

## 2012-07-07 DIAGNOSIS — Z1211 Encounter for screening for malignant neoplasm of colon: Secondary | ICD-10-CM

## 2012-07-07 MED ORDER — SODIUM CHLORIDE 0.9 % IV SOLN: 500.0000 mL | INTRAVENOUS | Status: DC

## 2012-07-07 NOTE — Progress Notes (Signed)
Patient did not experience any of the following events: a burn prior to discharge; a fall within the facility; wrong site/side/patient/procedure/implant event; or a hospital transfer or hospital admission upon discharge from the facility. (G8907) Patient did not have preoperative order for IV antibiotic SSI prophylaxis. (G8918)  

## 2012-07-07 NOTE — Patient Instructions (Addendum)
YOU HAD AN ENDOSCOPIC PROCEDURE TODAY AT THE [facility]: Refer to the procedure report that was given to you for any specific questions about what was found during the examination.  If the procedure report does not answer your questions, please call your gastroenterologist to clarify.  If you requested that your care partner not be given the details of your procedure findings, then the procedure report has been included in a sealed envelope for you to review at your convenience later.  YOU SHOULD EXPECT: Some feelings of bloating in the abdomen. Passage of more gas than usual.  Walking can help get rid of the air that was put into your GI tract during the procedure and reduce the bloating. If you had a lower endoscopy (such as a colonoscopy or flexible sigmoidoscopy) you may notice spotting of blood in your stool or on the toilet paper. If you underwent a bowel prep for your procedure, then you may not have a normal bowel movement for a few days.  DIET: Your first meal following the procedure should be a light meal and then it is ok to progress to your normal diet.  A half-sandwich or bowl of soup is an example of a good first meal.  Heavy or fried foods are harder to digest and may make you feel nauseous or bloated.  Likewise meals heavy in dairy and vegetables can cause extra gas to form and this can also increase the bloating.  Drink plenty of fluids but you should avoid alcoholic beverages for 24 hours.  ACTIVITY: Your care partner should take you home directly after the procedure.  You should plan to take it easy, moving slowly for the rest of the day.  You can resume normal activity the day after the procedure however you should NOT DRIVE or use heavy machinery for 24 hours (because of the sedation medicines used during the test).    SYMPTOMS TO REPORT IMMEDIATELY: A gastroenterologist can be reached at any hour.  During normal business hours, 8:30 AM to 5:00 PM Monday through Friday,  call [phone].  After hours and on weekends, please call the GI answering service at [phone] who will take a message and have the physician on call contact you.   Following lower endoscopy (colonoscopy or flexible sigmoidoscopy):  Excessive amounts of blood in the stool  Significant tenderness or worsening of abdominal pains  Swelling of the abdomen that is new, acute  Fever of 100F or higher  Following upper endoscopy (EGD)  Vomiting of blood or coffee ground material  New chest pain or pain under the shoulder blades  Painful or persistently difficult swallowing  New shortness of breath  Fever of 100F or higher  Black, tarry-looking stools  FOLLOW UP: If any biopsies were taken you will be contacted by phone or by letter within the next 1-3 weeks.  Call your gastroenterologist if you have not heard about the biopsies in 3 weeks.  Our staff will call the home number listed on your records the next business day following your procedure to check on you and address any questions or concerns that you may have at that time regarding the information given to you following your procedure. This is a courtesy call and so if there is no answer at the home number and we have not heard from you through the emergency physician on call, we will assume that you have returned to your regular daily activities without incident.  SIGNATURES/CONFIDENTIALITY: You and/or your care   partner have signed paperwork which will be entered into your electronic medical record.  These signatures attest to the fact that that the information above on your After Visit Summary has been reviewed and is understood.  Full responsibility of the confidentiality of this discharge information lies with you and/or your care-partner.  

## 2012-07-07 NOTE — Op Note (Signed)
[facility] [address] [place], [address]   COLONOSCOPY PROCEDURE REPORT  PATIENT: Kathryn Weaver, Kathryn Weaver  MR#: [id number] BIRTHDATE: 1946/09/18 , 66  yrs. old GENDER: Female ENDOSCOPIST: Meryl Dare, MD, [facility]  PROCEDURE DATE:  07/07/2012 PROCEDURE:   Colonoscopy with snare polypectomy ASA CLASS:   Class II INDICATIONS: high risker patient with personal history of colonic polyps: adenoma 03/2007. MEDICATIONS: MAC sedation, administered by CRNA and propofol (Diprivan) 250mg  IV DESCRIPTION OF PROCEDURE:   After the risks benefits and alternatives of the procedure were thoroughly explained, informed consent was obtained.  A digital rectal exam revealed no abnormalities of the rectum.   The LB [id number]-[id number]  endoscope was introduced through the anus and advanced to the cecum, which was identified by both the appendix and ileocecal valve. No adverse events experienced.   The quality of the prep was good, using MoviPrep  The instrument was then slowly withdrawn as the colon was fully examined.    COLON FINDINGS: A sessile polyp was found at the cecum.  A polypectomy was performed with a cold snare.   Moderate diverticulosis was noted in the descending colon and sigmoid colon. The colon was otherwise normal. Retroflexed views revealed internal hemorrhoids. The time to cecum=3 minutes 08 seconds Withdrawal time=10 minutes 50 seconds.  The scope was withdrawn and the procedure completed.  COMPLICATIONS: There were no complications.  ENDOSCOPIC IMPRESSION: 1.   Sessile polyp in the cecum; polypectomy was performed with a cold snare 2.   Moderate diverticulosis was noted in the descending and sigmoid colon  RECOMMENDATIONS: 1.  Await pathology results 2.  High fiber diet with liberal fluid intake. 3.  Repeat Colonoscopy in 5 years.  eSigned:  Meryl Dare, MD, [facility] 07/07/2012 10:20 AM

## 2012-07-08 ENCOUNTER — Telehealth: Payer: Self-pay | Admitting: *Deleted

## 2012-07-08 NOTE — Telephone Encounter (Signed)
  Follow up Call-  Call back number 07/07/2012  Post procedure Call Back phone  # [phone]  Permission to leave phone message Yes     Patient questions:  Do you have a fever, pain , or abdominal swelling? no Pain Score  0 *  Have you tolerated food without any problems? yes  Have you been able to return to your normal activities? yes  Do you have any questions about your discharge instructions: Diet   no Medications  no Follow up visit  no  Do you have questions or concerns about your Care? no  Actions: * If pain score is 4 or above: No action needed, pain <4.

## 2012-07-14 ENCOUNTER — Encounter: Payer: Self-pay | Admitting: Gastroenterology

## 2012-08-07 ENCOUNTER — Encounter: Payer: Self-pay | Admitting: Family Medicine

## 2012-08-07 ENCOUNTER — Ambulatory Visit
Admission: RE | Admit: 2012-08-07 | Discharge: 2012-08-07 | Disposition: A | Discharge status: Home / Self Care | Payer: Medicare Other | Source: Ambulatory Visit | Attending: Family Medicine | Admitting: Family Medicine

## 2012-08-07 ENCOUNTER — Ambulatory Visit (INDEPENDENT_AMBULATORY_CARE_PROVIDER_SITE_OTHER): Payer: Medicare Other | Admitting: Family Medicine

## 2012-08-07 ENCOUNTER — Ambulatory Visit (INDEPENDENT_AMBULATORY_CARE_PROVIDER_SITE_OTHER)
Admission: RE | Admit: 2012-08-07 | Discharge: 2012-08-07 | Disposition: A | Discharge status: Home / Self Care | Payer: Medicare Other | Source: Ambulatory Visit | Attending: Family Medicine | Admitting: Family Medicine

## 2012-08-07 VITALS — BP 136/82 | HR 62 | Temp 97.4°F | Ht 62.75 in | Wt 174.8 lb

## 2012-08-07 DIAGNOSIS — M79604 Pain in right leg: Secondary | ICD-10-CM

## 2012-08-07 DIAGNOSIS — R200 Anesthesia of skin: Secondary | ICD-10-CM

## 2012-08-07 DIAGNOSIS — R209 Unspecified disturbances of skin sensation: Secondary | ICD-10-CM

## 2012-08-07 DIAGNOSIS — R109 Unspecified abdominal pain: Secondary | ICD-10-CM

## 2012-08-07 DIAGNOSIS — M79609 Pain in unspecified limb: Secondary | ICD-10-CM

## 2012-08-07 LAB — CBC WITH DIFFERENTIAL/PLATELET
Basophils Relative: 0.5 % (ref 0.0–3.0)
Eosinophils Relative: 1 % (ref 0.0–5.0)
HCT: 40.3 % (ref 36.0–46.0)
Hemoglobin: 13.1 g/dL (ref 12.0–15.0)
MCHC: 32.5 g/dL (ref 30.0–36.0)
MCV: 97.2 fl (ref 78.0–100.0)
Monocytes Absolute: 0.4 10*3/uL (ref 0.1–1.0)
Neutro Abs: 1.2 10*3/uL — ABNORMAL LOW (ref 1.4–7.7)
Neutrophils Relative %: 41.5 % — ABNORMAL LOW (ref 43.0–77.0)
RBC: 4.14 Mil/uL (ref 3.87–5.11)
WBC: 2.8 10*3/uL — ABNORMAL LOW (ref 4.5–10.5)

## 2012-08-07 LAB — HEPATIC FUNCTION PANEL
ALT: 20 U/L (ref 0–35)
Albumin: 3.6 g/dL (ref 3.5–5.2)
Alkaline Phosphatase: 66 U/L (ref 39–117)
Bilirubin, Direct: 0.1 mg/dL (ref 0.0–0.3)
Total Protein: 7.5 g/dL (ref 6.0–8.3)

## 2012-08-07 LAB — LIPASE: Lipase: 52 U/L (ref 11.0–59.0)

## 2012-08-07 LAB — BASIC METABOLIC PANEL
BUN: 14 mg/dL (ref 6–23)
Creatinine, Ser: 0.8 mg/dL (ref 0.4–1.2)
GFR: 96.69 mL/min (ref >60.00)
Potassium: 4.2 mEq/L (ref 3.5–5.1)

## 2012-08-07 LAB — AMYLASE: Amylase: 78 U/L (ref 27–131)

## 2012-08-07 MED ORDER — HYDROCHLOROTHIAZIDE 25 MG PO TABS: 25.0000 mg | ORAL_TABLET | Freq: Every day | ORAL | Status: DC

## 2012-08-07 NOTE — Progress Notes (Signed)
Subjective:    Patient ID: Kathryn Weaver, female    DOB: [birth year], 66 y.o.   MRN: [id number]  HPI Here for leg pain and stomach  Right leg hurts No trauma or other injuries Thought it was a cramp? - but persistant- took some mag and vit D Also numbness Pain is from [place] whole leg to the foot 3rd toe is numb No loss of strength  Back does not hurt  Is nagging dull pain  No swelling or tenderness Hurts worst to lift leg to get out of a car   Stomach hurts  Zantac no longer helps  Episodes in April/ July and now  All preceeded with greasy food  Still has her gallbladder  Whole abd hurt and vomiting   No diarrhea or constipation or blood in stool  Patient Active Problem List  Diagnosis  . ANEMIA, MACROCYTIC  . ANEMIA NOS  . THROMBOCYTOPENIA  . DEPRESSION  . HYPERTENSION  . GERD  . DIVERTICULAR DISEASE  . POSTMENOPAUSAL STATUS  . TRANSAMINASES, SERUM, ELEVATED  . HX, PERSONAL, COLONIC POLYPS  . LEUKOCYTOPENIA UNSPECIFIED  . Chest pain  . Obesity  . Routine general medical examination at a health care facility  . Hyperlipidemia   Past Medical History  Diagnosis Date  . Hypertension   . Anemia     s/p GI bleed, hospital 5/08  . Colon polyps   . GERD (gastroesophageal reflux disease)   . Hiatal hernia     EGD 5/08- stricture, HH, gastritis, GERD  . Depression   . Hypertension   . Endometriosis     uterine US 06/2003   Past Surgical History  Procedure Date  . Tubal ligation   . Cardiac ct 9/12    normal    History  Substance Use Topics  . Smoking status: Never Smoker   . Smokeless tobacco: Never Used  . Alcohol Use: No   Family History  Problem Relation Age of Onset  . Hypertension Mother   . Cancer Father     unknown type  . Colon cancer Neg Hx   . Esophageal cancer Neg Hx   . Rectal cancer Neg Hx   . Stomach cancer Neg Hx    No Known Allergies Current Outpatient Prescriptions on File Prior to Visit  Medication Sig Dispense Refill    . Ferrous Sulfate (SLOW FE PO) One tablet by mouth occasionally       . hydrochlorothiazide (HYDRODIURIL) 25 MG tablet Take 1 tablet (25 mg total) by mouth daily.  90 tablet  3  . Multiple Vitamins-Minerals (CENTRUM PO) Take one by mouth daily       . sertraline (ZOLOFT) 50 MG tablet TAKE 1 TABLET EVERY DAY  30 tablet  11  . DISCONTD: ranitidine (ZANTAC) 150 MG capsule Take 1 capsule (150 mg total) by mouth 2 (two) times daily. For 3 months  60 capsule  2      Review of Systems Review of Systems  Constitutional: Negative for fever, appetite change, fatigue and unexpected weight change.  Eyes: Negative for pain and visual disturbance.  Respiratory: Negative for cough and shortness of breath.   Cardiovascular: Negative for cp or palpitations    Gastrointestinal: Negative for blood in stool or constipation, pos for abd pain and n/v at times  Genitourinary: Negative for urgency and frequency.  Skin: Negative for pallor or rash   MSK neg for back pain, pos for leg pain without swelling  Neurological: Negative for weakness, light-headedness,  numbness and headaches.  Hematological: Negative for adenopathy. Does not bruise/bleed easily.  Psychiatric/Behavioral: Negative for dysphoric mood. The patient is not nervous/anxious.         Objective:   Physical Exam  Constitutional: She appears well-developed and well-nourished. No distress.  HENT:  Head: Normocephalic and atraumatic.  Mouth/Throat: Oropharynx is clear and moist.  Eyes: Conjunctivae normal and EOM are normal. Pupils are equal, round, and reactive to light. No scleral icterus.  Neck: Normal range of motion. Neck supple. No JVD present. Carotid bruit is not present. No thyromegaly present.  Cardiovascular: Normal rate, regular rhythm, normal heart sounds and intact distal pulses.  Exam reveals no gallop.   Pulmonary/Chest: Effort normal and breath sounds normal. No respiratory distress. She has no wheezes.  Abdominal: Soft. Bowel  sounds are normal. She exhibits no distension, no abdominal bruit and no mass. There is no tenderness. There is no rebound and no guarding.  Musculoskeletal: Normal range of motion. She exhibits no edema and no tenderness.       No LS tenderness No piriformis tenderness Nl rom LS and hips No leg tenderness Nl rom leg and foot    Lymphadenopathy:    She has no cervical adenopathy.  Neurological: She is alert. She has normal reflexes. She displays no atrophy. No cranial nerve deficit or sensory deficit. She exhibits normal muscle tone. Coordination and gait normal.  Skin: Skin is warm and dry. No rash noted. No erythema. No pallor.  Psychiatric: She has a normal mood and affect.          Assessment & Plan:

## 2012-08-07 NOTE — Patient Instructions (Addendum)
Xray of back today Labs today  Schedule abdominal ultrasound at check out  We will update with results as soon as we get them  Use heat on leg if needed  Use caution with advil for GI upset- take with food No fatty foods!

## 2012-08-07 NOTE — Assessment & Plan Note (Signed)
R 3rd toe with leg pain Suspect radicular pain  Xray

## 2012-08-07 NOTE — Assessment & Plan Note (Signed)
After fatty foods- suspect for gallbladder issue (also vomiting) asympt now  Lab today [place] abd planned Adv to avoid fatty food Go to ER if sudden worsening

## 2012-08-07 NOTE — Assessment & Plan Note (Signed)
Exam consistent with radicular pain  LS xray today No swelling  Given pain in post thigh- check DDimer also  Adv to use nsaid sparingly due to recent GI problem

## 2012-08-08 LAB — D-DIMER, QUANTITATIVE: D-Dimer, Quant: 0.4 ug/mL-FEU (ref 0.00–0.48)

## 2012-08-11 ENCOUNTER — Telehealth: Payer: Self-pay | Admitting: Family Medicine

## 2012-08-11 DIAGNOSIS — M79604 Pain in right leg: Secondary | ICD-10-CM

## 2012-08-11 DIAGNOSIS — R109 Unspecified abdominal pain: Secondary | ICD-10-CM

## 2012-08-11 DIAGNOSIS — K9049 Malabsorption due to intolerance, not elsewhere classified: Secondary | ICD-10-CM | POA: Insufficient documentation

## 2012-08-11 NOTE — Telephone Encounter (Signed)
Message copied by Judy Pimple on Tue Aug 11, 2012  7:53 AM ------      Message from: Shon Millet      Created: Mon Aug 10, 2012  5:23 PM       Notified pt of ultrasound results and she said she does want to proceed with the gallbladder function test

## 2012-08-11 NOTE — Addendum Note (Signed)
Addended by: Roxy Manns A on: 08/11/2012 02:44 PM   Modules accepted: Orders

## 2012-08-11 NOTE — Telephone Encounter (Signed)
I will go ahead with the referrals

## 2012-08-12 NOTE — Telephone Encounter (Signed)
Appts made for Hepato Scan at [facility] and also Ortho referral made with patient.

## 2012-08-25 ENCOUNTER — Encounter (HOSPITAL_COMMUNITY): Payer: Self-pay

## 2012-08-25 ENCOUNTER — Encounter (HOSPITAL_COMMUNITY)
Admission: RE | Admit: 2012-08-25 | Discharge: 2012-08-25 | Disposition: A | Discharge status: Home / Self Care | Payer: Medicare Other | Source: Ambulatory Visit | Attending: Family Medicine | Admitting: Family Medicine

## 2012-08-25 DIAGNOSIS — K9049 Malabsorption due to intolerance, not elsewhere classified: Secondary | ICD-10-CM

## 2012-08-25 DIAGNOSIS — R109 Unspecified abdominal pain: Secondary | ICD-10-CM

## 2012-08-25 MED ORDER — TECHNETIUM TC 99M MEBROFENIN IV KIT
4.9000 | PACK | Freq: Once | INTRAVENOUS | Status: AC | PRN
  Administered 2012-08-25: 4.9 via INTRAVENOUS

## 2012-09-01 ENCOUNTER — Encounter: Payer: Self-pay | Admitting: Family Medicine

## 2012-09-01 ENCOUNTER — Ambulatory Visit (INDEPENDENT_AMBULATORY_CARE_PROVIDER_SITE_OTHER): Payer: Medicare Other | Admitting: Family Medicine

## 2012-09-01 ENCOUNTER — Encounter: Payer: Self-pay | Admitting: Gastroenterology

## 2012-09-01 VITALS — BP 114/74 | HR 62 | Temp 98.2°F | Ht 62.75 in | Wt 173.2 lb

## 2012-09-01 DIAGNOSIS — R109 Unspecified abdominal pain: Secondary | ICD-10-CM

## 2012-09-01 DIAGNOSIS — F430 Acute stress reaction: Secondary | ICD-10-CM | POA: Insufficient documentation

## 2012-09-01 DIAGNOSIS — K9049 Malabsorption due to intolerance, not elsewhere classified: Secondary | ICD-10-CM

## 2012-09-01 DIAGNOSIS — K9089 Other intestinal malabsorption: Secondary | ICD-10-CM

## 2012-09-01 NOTE — Progress Notes (Signed)
Subjective:    Patient ID: Kathryn Weaver, female    DOB: 06-19-1946, 66 y.o.   MRN: [id number]  HPI Here for intol of fatty foods and abd pain No attacks since here  [place] abd -no gallstones, also hepatobiliary scan normal too   Afraid to eat at this point- gets nervous  Avoiding fatty foods Ate waffle and egg- felt bad and then immediately stopped  No episodes since the last visit No heartburn or ongoing pain  No more zantac- not helpful  prilosec- has taken in past - has not taken (they do work better) No heartburn   Labs all ok  Baseline low wbc count  Neg liver tests and amylase and lipase  No exp to liver dz of any kind  No alcohol intake No nsaid intake   Has severe stress- her son is in court for domestic abuse case   Patient Active Problem List  Diagnosis  . ANEMIA, MACROCYTIC  . ANEMIA NOS  . THROMBOCYTOPENIA  . DEPRESSION  . HYPERTENSION  . GERD  . DIVERTICULAR DISEASE  . POSTMENOPAUSAL STATUS  . TRANSAMINASES, SERUM, ELEVATED  . HX, PERSONAL, COLONIC POLYPS  . LEUKOCYTOPENIA UNSPECIFIED  . Chest pain  . Obesity  . Routine general medical examination at a health care facility  . Hyperlipidemia  . Abdominal pain  . Pain of right leg  . Numbness of toes  . Intolerance to fatty food   Past Medical History  Diagnosis Date  . Hypertension   . Anemia     s/p GI bleed, hospital 5/08  . Colon polyps   . GERD (gastroesophageal reflux disease)   . Hiatal hernia     EGD 5/08- stricture, HH, gastritis, GERD  . Depression   . Hypertension   . Endometriosis     uterine US 06/2003   Past Surgical History  Procedure Date  . Tubal ligation   . Cardiac ct 9/12    normal    History  Substance Use Topics  . Smoking status: Never Smoker   . Smokeless tobacco: Never Used  . Alcohol Use: No   Family History  Problem Relation Age of Onset  . Hypertension Mother   . Cancer Father     unknown type  . Colon cancer Neg Hx   . Esophageal cancer Neg Hx   .  Rectal cancer Neg Hx   . Stomach cancer Neg Hx    No Known Allergies Current Outpatient Prescriptions on File Prior to Visit  Medication Sig Dispense Refill  . Ferrous Sulfate (SLOW FE PO) One tablet by mouth occasionally       . hydrochlorothiazide (HYDRODIURIL) 25 MG tablet Take 1 tablet (25 mg total) by mouth daily.  90 tablet  3  . Multiple Vitamins-Minerals (CENTRUM PO) Take one by mouth daily       . sertraline (ZOLOFT) 50 MG tablet TAKE 1 TABLET EVERY DAY  30 tablet  11  . DISCONTD: ranitidine (ZANTAC) 150 MG capsule Take 1 capsule (150 mg total) by mouth 2 (two) times daily. For 3 months  60 capsule  2        Review of Systems Review of Systems  Constitutional: Negative for fever, appetite change, fatigue and unexpected weight change.  Eyes: Negative for pain and visual disturbance.  Respiratory: Negative for cough and shortness of breath.   Cardiovascular: Negative for cp or palpitations    Gastrointestinal: Negative for  diarrhea and constipation. neg for blood in stool or dark stool  Genitourinary: Negative for urgency and frequency. neg for bladder pain  Skin: Negative for pallor or rash   Neurological: Negative for weakness, light-headedness, numbness and headaches.  Hematological: Negative for adenopathy. Does not bruise/bleed easily.  Psychiatric/Behavioral: Negative for dysphoric mood. The patient is not nervous/anxious.  pos for much stress       Objective:   Physical Exam  Constitutional: She appears well-developed and well-nourished. No distress.  HENT:  Head: Normocephalic and atraumatic.  Mouth/Throat: Oropharynx is clear and moist.  Eyes: Conjunctivae normal and EOM are normal. Pupils are equal, round, and reactive to light. Right eye exhibits no discharge. Left eye exhibits no discharge.  Neck: Normal range of motion. Neck supple. No JVD present. No thyromegaly present.  Cardiovascular: Normal rate and regular rhythm.   Pulmonary/Chest: Effort normal.  She has no wheezes.  Abdominal: Soft. Bowel sounds are normal. She exhibits no distension and no mass. There is no hepatosplenomegaly. There is tenderness in the right upper quadrant, epigastric area and periumbilical area. There is no rebound, no guarding and no CVA tenderness.  Musculoskeletal: She exhibits no edema.  Lymphadenopathy:    She has no cervical adenopathy.  Neurological: She is alert. She has normal reflexes.  Skin: Skin is warm and dry. No rash noted. No pallor.  Psychiatric: She has a normal mood and affect.          Assessment & Plan:

## 2012-09-01 NOTE — Assessment & Plan Note (Signed)
This continues with nausea/ abd pain despite neg [place] abd and hida No imp with H2 blocker Will try PPI Ref to GI

## 2012-09-01 NOTE — Patient Instructions (Addendum)
Keep watching your diet  Start on prilosec over the counter 20 mg one pill daily over the counter in the am  We will do GI referral at check out  If you want to talk to a counselor about stress - please let me know and we will set you up with someone

## 2012-09-01 NOTE — Assessment & Plan Note (Signed)
Unsure if this could be adding to her GI symptoms  She has a son in court for domestic abuse She declines counseling or tx now but may change mind in the future Will keep me posted

## 2012-09-01 NOTE — Assessment & Plan Note (Signed)
See assessment for intol to fatty foods Gastritis is possible  Pt is under much stress Trial of prilosec 20 otc Rev studies and labs Ref to GI

## 2012-09-23 ENCOUNTER — Ambulatory Visit: Payer: Medicare Other | Admitting: Gastroenterology

## 2012-10-12 ENCOUNTER — Ambulatory Visit (INDEPENDENT_AMBULATORY_CARE_PROVIDER_SITE_OTHER): Payer: Medicare Other | Admitting: Gastroenterology

## 2012-10-12 ENCOUNTER — Encounter: Payer: Self-pay | Admitting: Gastroenterology

## 2012-10-12 VITALS — BP 122/62 | HR 72 | Ht 63.0 in | Wt 175.2 lb

## 2012-10-12 DIAGNOSIS — K219 Gastro-esophageal reflux disease without esophagitis: Secondary | ICD-10-CM

## 2012-10-12 DIAGNOSIS — Z86010 Personal history of colonic polyps: Secondary | ICD-10-CM

## 2012-10-12 DIAGNOSIS — R1013 Epigastric pain: Secondary | ICD-10-CM

## 2012-10-12 NOTE — Progress Notes (Signed)
History of Present Illness: This is a 66 year old female who relates episodes of epigastric pain and substernal pain following high fat meals. Her symptoms occur intermittently. She previously underwent upper endoscopy in 2001 which revealed a 6-7 cm hiatal hernia, esophagitis, gastritis and duodenitis. She recently underwent an abdominal ultrasound and CCK HIDA which were unremarkable. She was placed on Prilosec OTC daily and advised to avoid high fat foods and her symptoms have been under complete control. She underwent colonoscopy in August. Denies weight loss, abdominal pain, constipation, diarrhea, change in stool caliber, melena, hematochezia, nausea, vomiting, dysphagia, reflux symptoms, chest pain.  Current Medications, Allergies, Past Medical History, Past Surgical History, Family History and Social History were reviewed in [employer] record.  Physical Exam: General: Well developed , well nourished, no acute distress Head: Normocephalic and atraumatic Eyes:  sclerae anicteric, EOMI Ears: Normal auditory acuity Mouth: No deformity or lesions Lungs: Clear throughout to auscultation Heart: Regular rate and rhythm; no murmurs, rubs or bruits Abdomen: Soft, non tender and non distended. No masses, hepatosplenomegaly or hernias noted. Normal Bowel sounds Musculoskeletal: Symmetrical with no gross deformities  Pulses:  Normal pulses noted Extremities: No clubbing, cyanosis, edema or deformities noted Neurological: Alert oriented x 4, grossly nonfocal Psychological:  Alert and cooperative. Normal mood and affect  Assessment and Recommendations:  1. GERD and epigastric pain. Presumed reflux leading to episodic epigastric pain and substernal burning. Long-term antireflux measures, strictly avoiding high fat and greasy foods. Continue omeprazole 20 mg daily long-term. If her symptoms do not remain under very good control consider proceeding with upper endoscopy for further  evaluation. Ongoing followup with Dr. Milinda Antis and I will see back on referral.  2. Personal history of adenomatous colon polyps. Surveillance colonoscopy recommended August 2018.

## 2012-10-12 NOTE — Patient Instructions (Addendum)
Continue Prilosec over the counter once daily.  Patient advised to avoid spicy, acidic, citrus, chocolate, mints, fruit and fruit juices.  Limit the intake of caffeine, alcohol and Soda.  Don't exercise too soon after eating.  Don't lie down within 3-4 hours of eating.  Elevate the head of your bed.

## 2012-12-15 ENCOUNTER — Other Ambulatory Visit: Payer: Self-pay | Admitting: Family Medicine

## 2012-12-15 DIAGNOSIS — Z1231 Encounter for screening mammogram for malignant neoplasm of breast: Secondary | ICD-10-CM

## 2012-12-22 ENCOUNTER — Ambulatory Visit
Admission: RE | Admit: 2012-12-22 | Discharge: 2012-12-22 | Disposition: A | Discharge status: Home / Self Care | Payer: Medicare HMO | Source: Ambulatory Visit | Attending: Family Medicine | Admitting: Family Medicine

## 2012-12-22 DIAGNOSIS — Z1231 Encounter for screening mammogram for malignant neoplasm of breast: Secondary | ICD-10-CM

## 2012-12-23 ENCOUNTER — Encounter: Payer: Self-pay | Admitting: *Deleted

## 2013-01-05 ENCOUNTER — Ambulatory Visit (INDEPENDENT_AMBULATORY_CARE_PROVIDER_SITE_OTHER): Payer: Medicare HMO | Admitting: Family Medicine

## 2013-01-05 ENCOUNTER — Telehealth: Payer: Self-pay | Admitting: Family Medicine

## 2013-01-05 ENCOUNTER — Encounter: Payer: Self-pay | Admitting: Family Medicine

## 2013-01-05 VITALS — BP 124/78 | HR 68 | Temp 98.2°F | Wt 176.8 lb

## 2013-01-05 DIAGNOSIS — R1319 Other dysphagia: Secondary | ICD-10-CM | POA: Insufficient documentation

## 2013-01-05 DIAGNOSIS — R1314 Dysphagia, pharyngoesophageal phase: Secondary | ICD-10-CM

## 2013-01-05 DIAGNOSIS — R1310 Dysphagia, unspecified: Secondary | ICD-10-CM

## 2013-01-05 MED ORDER — OMEPRAZOLE 40 MG PO CPDR: 40.0000 mg | DELAYED_RELEASE_CAPSULE | Freq: Every day | ORAL | Status: DC

## 2013-01-05 NOTE — Telephone Encounter (Signed)
Seen today. 

## 2013-01-05 NOTE — Telephone Encounter (Signed)
Patient Information:  Caller Name: Kendelle  Phone: [phone]  Patient: Kathryn Weaver, Kathryn Weaver  Gender: Female  DOB: 22-May-1946  Age: 67 Years  PCP: Roxy Manns [facility])  Office Follow Up:  Does the office need to follow up with this patient?: No  Instructions For The Office: N/A  RN Note:  Patient states she has hx of GERD. States she is prescribed Prilosec. Patient states increased reflux and esophageal discomfort noted after eating,  onset 01/02/13. States sx unrelieved by Prilosec. Patient is requesting that Prilosec be changed to Nexium. Patient states she feels     " like the food isn't going down." Denies choking episodes or drooling. Denies difficulty swallowing. States sx are accompanied by nausea. States sx last approx. 30 minutes and resolve. Denies chest pain or shortness of breath. Care advice given per guidelines. Call back parameters reviewed. Patient verbalizes understanding. No appts. available with Dr. Milinda Antis. Appt. scheduled for 01/05/13 1545 with Dr. Sharen Hones.  Symptoms  Reason For Call & Symptoms: Reflux Hx: HTN, GERD Meds: HCTZ, Prilosec, Multivitamin  Reviewed Health History In EMR: Yes  Reviewed Medications In EMR: Yes  Reviewed Allergies In EMR: Yes  Reviewed Surgeries / Procedures: Yes  Date of Onset of Symptoms: 01/02/2013  Treatments Tried: Prilosec  Treatments Tried Worked: No  Guideline(s) Used:  Abdominal Pain - Female  Abdominal Pain - Upper  Disposition Per Guideline:   See Today in Office  Reason For Disposition Reached:   Age > 60 years  Advice Given:  Reassurance:  A mild stomachache can be from indigestion, stomach irritation, or overeating. Sometimes a stomachache signals the onset of a vomiting illness from a viral infection.  Fluids:   Sip clear fluids only (e.g., water, flat soft drinks, or half-strength fruit juice) until the pain is gone for 2 hours. Then slowly return to a regular diet.  Reducing Reflux Symptoms (GERD):  Eat  smaller meals and avoid snacks for 2 hours before sleeping. Avoid the following foods, which tend to aggravate heartburn and stomach problems: fatty/greasy foods, spicy foods, caffeinated beverages, mints, and chocolate.  Call Back If:  Abdominal pain is constant and present for more than 2 hours.  You become worse.  Appointment Scheduled:  01/05/2013 15:45:00 Appointment Scheduled Provider:  Eustaquio Boyden [facility])

## 2013-01-05 NOTE — Patient Instructions (Signed)
I think you may have stricture of esophagus. May try increased dose of omeprazole 40mg  daily. If any more episodes or if you decide to pursue treatment, call us for referral to stomach doctor. Good to see you today, call us with questions.  Esophageal Stricture The esophagus is the long, narrow tube which carries food and liquid from the mouth to the stomach. Sometimes a part of the esophagus becomes narrow and makes it difficult, painful, or even impossible to swallow. This is called an esophageal stricture.  CAUSES  Common causes of blockage or strictures of the esophagus are:  Exposure of the lower esophagus to the acid from the stomach may cause narrowing.  Hiatal hernia in which a small part of the stomach bulges up through the diaphragm can cause a narrowing in the bottom of the esophagus.  Scleroderma is a tissue disorder that affects the esophagus and makes swallowing difficult.  Achalasia is an absence of nerves in the lower esophagus and to the esophageal sphincter. This absence of nerves may be congenital (present since birth). This can cause irregular spasms which do not allow food and fluid through.  Strictures may develop from swallowing materials which damage the esophagus. Examples are acids or alkalis such as lye.  Schatzki's Ring is a narrow ring of non-cancerous tissue which narrows the lower esophagus. The cause of this is unknown.  Growths can block the esophagus. SYMPTOMS  Some of the problems are difficulty swallowing or pain with swallowing. DIAGNOSIS  Your caregiver often suspects this problem by taking a medical history. They will also do a physical exam. They may then take X-rays and/or perform an endoscopy. Endoscopy is an exam in which a tube like a small flexible telescope is used to look at your esophagus.  TREATMENT  One form of treatment is to dilate the narrow area. This means to stretch it.  When this is not successful, chest surgery may be required.  This is a much more extensive form of treatment with a longer recovery time. Both of the above treatments make the passage of food and water into the stomach easier. They also make it easier for stomach contents to bubble back into the esophagus. Special medications may be used following the procedure to help prevent further narrowing. Medications may be used to lower the amount of acid in the stomach juice.  SEEK IMMEDIATE MEDICAL CARE IF:   Your swallowing is becoming more painful, difficult, or you are unable to swallow.  You vomit up blood.  You develop black tarry stools.  You develop chills.  You have a fever.  You develop chest or abdominal pain.  You develop shortness of breath, feel lightheaded, or faint. Follow up with medical care as your caregiver suggests. Document Released: 07/08/2006 Document Revised: 01/20/2012 Document Reviewed: 08/14/2006 [facility] Patient Information 2013 [place], [place].

## 2013-01-05 NOTE — Assessment & Plan Note (Signed)
Isolated episode of dysphagia in h/o esophageal stricture. No red flags. Reviewed EGD from 2008. Discussed anticipated rpt stricture and referral to GI for EGD. Pt declines currently ("i'm scared of EGD"), would like to try higher dose of PPI and if rpt episode or changes her mind, will call us for referral.

## 2013-01-05 NOTE — Progress Notes (Signed)
  Subjective:    Patient ID: Kathryn Weaver, female    DOB: 1946-09-12, 67 y.o.   MRN: [id number]  HPI CC: trouble swallowing  H/o GERD and HH, s/p EGD 2008 showing gastritis and stricture. Has been on omeprazole 20mg  daily for the last year.  Saturday after eating peanut butter sandwich, felt it sticking in throat.  Happened a few seconds after swallowing.  Eventually did pass.  No trouble swallowing liquids.  Dysphagia episode isolated.  Last episode was 02/19/2012. Does burp a lot.   Did have similar episode 6 yrs ago, found to have stricture. No pain with swallowing, no early satiety, no nausea/vomiting. No heartburn per say or bloating/indigestion. No diarrhea, constipation, blood in stool.  Rarely takes aleve.  Wt Readings from Last 3 Encounters:  01/05/13 176 lb 12 oz (80.173 kg)  10/12/12 175 lb 3.2 oz (79.47 kg)  09/01/12 173 lb 4 oz (78.586 kg)    Past Medical History  Diagnosis Date  . Hypertension   . Anemia     s/p GI bleed, hospital 5/08  . Adenomatous polyp of colon 03/2007  . GERD (gastroesophageal reflux disease)   . Hiatal hernia     EGD 5/08- stricture, HH, gastritis, GERD  . Depression   . Hypertension   . Endometriosis     uterine US 06/2003  . Diverticulosis     Past Surgical History  Procedure Laterality Date  . Tubal ligation    . Cardiac ct  9/12    normal      Review of Systems Per HPI    Objective:   Physical Exam  Nursing note and vitals reviewed. Constitutional: She appears well-developed and well-nourished. No distress.  HENT:  Mouth/Throat: Oropharynx is clear and moist. No oropharyngeal exudate.  Cardiovascular: Normal rate, regular rhythm, normal heart sounds and intact distal pulses.   No murmur heard. Pulmonary/Chest: Effort normal and breath sounds normal. No respiratory distress. She has no wheezes. She has no rales.  Abdominal: Soft. Bowel sounds are normal. She exhibits no distension and no mass. There is no tenderness.  There is no rebound and no guarding.  Skin: Skin is warm and dry. No rash noted.  Psychiatric: She has a normal mood and affect.       Assessment & Plan:

## 2013-04-06 ENCOUNTER — Other Ambulatory Visit: Payer: Self-pay | Admitting: Family Medicine

## 2013-04-06 NOTE — Telephone Encounter (Signed)
Received fax refill request, Dr. Milinda Antis isn't in office all week, please advise

## 2013-04-07 MED ORDER — SERTRALINE HCL 50 MG PO TABS: ORAL_TABLET | ORAL | Status: DC

## 2013-04-07 NOTE — Telephone Encounter (Signed)
Done, Dr. Milinda Antis when would you like pt to f/u with you?

## 2013-04-07 NOTE — Telephone Encounter (Signed)
Please have her f/u with me this summer, thanks

## 2013-04-07 NOTE — Telephone Encounter (Signed)
Okay to fill 3 months worth Check with Dr Milinda Antis about when she wants to see her again

## 2013-04-12 NOTE — Telephone Encounter (Signed)
F/u appt scheduled for 05/31/13 with Dr. Milinda Antis

## 2013-05-31 ENCOUNTER — Ambulatory Visit (INDEPENDENT_AMBULATORY_CARE_PROVIDER_SITE_OTHER): Payer: Medicare HMO | Admitting: Family Medicine

## 2013-05-31 ENCOUNTER — Encounter: Payer: Self-pay | Admitting: Family Medicine

## 2013-05-31 VITALS — BP 122/68 | HR 62 | Temp 97.6°F | Ht 63.0 in | Wt 178.5 lb

## 2013-05-31 DIAGNOSIS — D72819 Decreased white blood cell count, unspecified: Secondary | ICD-10-CM

## 2013-05-31 DIAGNOSIS — K219 Gastro-esophageal reflux disease without esophagitis: Secondary | ICD-10-CM

## 2013-05-31 DIAGNOSIS — D696 Thrombocytopenia, unspecified: Secondary | ICD-10-CM

## 2013-05-31 DIAGNOSIS — I10 Essential (primary) hypertension: Secondary | ICD-10-CM

## 2013-05-31 LAB — CBC WITH DIFFERENTIAL/PLATELET
Basophils Absolute: 0 10*3/uL (ref 0.0–0.1)
Eosinophils Relative: 0.7 % (ref 0.0–5.0)
Lymphocytes Relative: 44.1 % (ref 12.0–46.0)
Monocytes Relative: 13.3 % — ABNORMAL HIGH (ref 3.0–12.0)
Neutrophils Relative %: 41.7 % — ABNORMAL LOW (ref 43.0–77.0)
Platelets: 101 10*3/uL — ABNORMAL LOW (ref 150.0–400.0)
RDW: 13.6 % (ref 11.5–14.6)
WBC: 2.9 10*3/uL — ABNORMAL LOW (ref 4.5–10.5)

## 2013-05-31 MED ORDER — SERTRALINE HCL 50 MG PO TABS: ORAL_TABLET | ORAL | Status: DC

## 2013-05-31 MED ORDER — HYDROCHLOROTHIAZIDE 25 MG PO TABS: 25.0000 mg | ORAL_TABLET | Freq: Every day | ORAL | Status: DC

## 2013-05-31 MED ORDER — OMEPRAZOLE 40 MG PO CPDR: 40.0000 mg | DELAYED_RELEASE_CAPSULE | Freq: Every day | ORAL | Status: DC

## 2013-05-31 NOTE — Assessment & Plan Note (Signed)
bp in fair control at this time  No changes needed  Disc lifstyle change with low sodium diet and exercise  Last lab reviewed

## 2013-05-31 NOTE — Assessment & Plan Note (Signed)
Refilled omeprazole Pt has failed effort to reduce dose or wean off of it Disc diet  No change in tx

## 2013-05-31 NOTE — Assessment & Plan Note (Signed)
Pt has not had recent f/u with hematology- since she has been stable and asymptomatic - just wants to monitor it No recent infx or bleeding

## 2013-05-31 NOTE — Patient Instructions (Addendum)
Take care of yourself  Lab today for blood count Keep watching diet and exercising  No change in medicines Follow up in 6 months for PE with lab prior

## 2013-05-31 NOTE — Progress Notes (Signed)
Subjective:    Patient ID: Kathryn Weaver, female    DOB: [birth year], 67 y.o.   MRN: [id number]  HPI Here for medication refills  Is doing fine   Wt is up 2 lb with bmi of 31 Is taking care of herself  Is eating a healthy diet- doing better overall - watching salt and intol of fats Is walking regularly for exercise   hctz one daily bp is stable today  No cp or palpitations or headaches or edema  No side effects to medicines  BP Readings from Last 3 Encounters:  05/31/13 122/68  01/05/13 124/78  10/12/12 122/62      Chemistry      Component Value Date/Time   NA 141 08/07/2012 0848   K 4.2 08/07/2012 0848   CL 105 08/07/2012 0848   CO2 31 08/07/2012 0848   BUN 14 08/07/2012 0848   CREATININE 0.8 08/07/2012 0848      Component Value Date/Time   CALCIUM 10.0 08/07/2012 0848   ALKPHOS 66 08/07/2012 0848   AST 23 08/07/2012 0848   ALT 20 08/07/2012 0848   BILITOT 0.5 08/07/2012 0848        On zoloft for depression That helps greatly and her mood is very good   Omeprazole for gerd- she does well on that -- and cannot miss a day  Is not able to get off of it  No nsaids    Ferrous sulfate for anemia Lab Results  Component Value Date   WBC 2.8* 08/07/2012   HGB 13.1 08/07/2012   HCT 40.3 08/07/2012   MCV 97.2 08/07/2012   PLT 126.0* 08/07/2012  still taking iron  Wbc and platelets stay mildly low -this is an old problem     Review of Systems Review of Systems  Constitutional: Negative for fever, appetite change, fatigue and unexpected weight change.  Eyes: Negative for pain and visual disturbance.  Respiratory: Negative for cough and shortness of breath.   Cardiovascular: Negative for cp or palpitations    Gastrointestinal: Negative for nausea, diarrhea and constipation.  Genitourinary: Negative for urgency and frequency.  Skin: Negative for pallor or rash   Neurological: Negative for weakness, light-headedness, numbness and headaches.  Hematological: Negative for  adenopathy. Does not bruise/bleed easily.  Psychiatric/Behavioral: Negative for dysphoric mood. The patient is not nervous/anxious.         Objective:   Physical Exam  Constitutional: She appears well-developed and well-nourished. No distress.  obese and well appearing   HENT:  Head: Normocephalic and atraumatic.  Mouth/Throat: Oropharynx is clear and moist.  Eyes: Conjunctivae and EOM are normal. Pupils are equal, round, and reactive to light. Right eye exhibits no discharge. Left eye exhibits no discharge. No scleral icterus.  Neck: Normal range of motion. Neck supple. No JVD present. Carotid bruit is not present. No thyromegaly present.  Cardiovascular: Normal rate, regular rhythm, normal heart sounds and intact distal pulses.  Exam reveals no gallop.   Pulmonary/Chest: Effort normal and breath sounds normal. No respiratory distress. She has no wheezes. She has no rales.  Abdominal: Soft. Bowel sounds are normal. She exhibits no distension, no abdominal bruit and no mass. There is no tenderness.  Musculoskeletal: She exhibits no edema and no tenderness.  Lymphadenopathy:    She has no cervical adenopathy.  Neurological: She is alert. She has normal reflexes. No cranial nerve deficit. She exhibits normal muscle tone. Coordination normal.  Skin: Skin is warm and dry. No rash noted. No erythema. No  pallor.  Psychiatric: She has a normal mood and affect.          Assessment & Plan:

## 2013-05-31 NOTE — Assessment & Plan Note (Signed)
Pt has not been back to heme No bleeding or bruising  Lab today

## 2013-06-01 ENCOUNTER — Telehealth: Payer: Self-pay | Admitting: Family Medicine

## 2013-06-01 DIAGNOSIS — D72819 Decreased white blood cell count, unspecified: Secondary | ICD-10-CM

## 2013-06-01 DIAGNOSIS — D696 Thrombocytopenia, unspecified: Secondary | ICD-10-CM

## 2013-06-01 NOTE — Telephone Encounter (Signed)
Message copied by Judy Pimple on Tue Jun 01, 2013  4:54 PM ------      Message from: Shon Millet      Created: Tue Jun 01, 2013  2:41 PM       Pt advise of lab results and request that you put a referral in for pt to go back and see Dr. Orlie Dakin, I advise pt that our pt care cor. will call her to set up appt ------

## 2013-06-01 NOTE — Telephone Encounter (Signed)
Ref for heme onc

## 2013-06-09 ENCOUNTER — Encounter (HOSPITAL_COMMUNITY): Payer: Medicare HMO | Attending: Hematology and Oncology

## 2013-06-09 ENCOUNTER — Encounter (HOSPITAL_COMMUNITY): Payer: Self-pay

## 2013-06-09 VITALS — BP 118/76 | HR 54 | Temp 97.9°F | Resp 16 | Ht 62.5 in | Wt 179.2 lb

## 2013-06-09 DIAGNOSIS — D696 Thrombocytopenia, unspecified: Secondary | ICD-10-CM

## 2013-06-09 DIAGNOSIS — D72819 Decreased white blood cell count, unspecified: Secondary | ICD-10-CM | POA: Insufficient documentation

## 2013-06-09 LAB — COMPREHENSIVE METABOLIC PANEL
AST: 27 U/L (ref 0–37)
Albumin: 3.7 g/dL (ref 3.5–5.2)
Alkaline Phosphatase: 92 U/L (ref 39–117)
CO2: 32 mEq/L (ref 19–32)
Chloride: 101 mEq/L (ref 96–112)
Creatinine, Ser: 0.73 mg/dL (ref 0.50–1.10)
GFR calc non Af Amer: 87 mL/min — ABNORMAL LOW (ref >90)
Potassium: 4.2 mEq/L (ref 3.5–5.1)
Total Bilirubin: 0.4 mg/dL (ref 0.3–1.2)

## 2013-06-09 LAB — CBC WITH DIFFERENTIAL/PLATELET
Basophils Absolute: 0 10*3/uL (ref 0.0–0.1)
Eosinophils Relative: 1 % (ref 0–5)
HCT: 41.3 % (ref 36.0–46.0)
Lymphocytes Relative: 50 % — ABNORMAL HIGH (ref 12–46)
Lymphs Abs: 1.5 10*3/uL (ref 0.7–4.0)
MCV: 96 fL (ref 78.0–100.0)
Monocytes Absolute: 0.3 10*3/uL (ref 0.1–1.0)
Neutro Abs: 1.2 10*3/uL — ABNORMAL LOW (ref 1.7–7.7)
Platelets: 106 10*3/uL — ABNORMAL LOW (ref 150–400)
RBC: 4.3 MIL/uL (ref 3.87–5.11)
Smear Review: DECREASED
WBC: 3.1 10*3/uL — ABNORMAL LOW (ref 4.0–10.5)

## 2013-06-09 LAB — LACTATE DEHYDROGENASE: LDH: 166 U/L (ref 94–250)

## 2013-06-09 NOTE — Progress Notes (Addendum)
Patient History and Physical   KRYSTAL TEACHEY [id number] [birth year] 67 y.o. 06/09/2013  Referring MD: Roxy Manns ,MD.  Chief Complaint: Low WBC and platelets   HPI:  Ms. Solana is a 67 year old African American woman, who tells me that she has had low  WBC and platelets 'all her life' with no abnormal bleeding or bruising problem or unusual infection. She also tells me that her sister and brother have similar problem and have not required any treatment for these.  She also tells me that she feels well and was referred by Dr.  Milinda Antis for evaluation. I have looked at historical CBC dating back to May of 2008 to July 2014; WBC has arranged from 6.3K - 2.6K and  ANC has ranged from 2.9K-1K.  Additionally, platelet has ranged from 223K-93K and most recently 101K. Since 2010  patient has maintained a normal hemoglobin but was anemic in  2008-2009.  She denies any night sweats, fever or chills, weight loss or fatigue.  She is eating well.   PMH: Past Medical History  Diagnosis Date  . Hypertension   . Anemia     s/p GI bleed, hospital 5/08  . Adenomatous polyp of colon 03/2007  . GERD (gastroesophageal reflux disease)   . Hiatal hernia     EGD 5/08- stricture, HH, gastritis, GERD  . Depression   . Hypertension   . Endometriosis     uterine US 06/2003  . Diverticulosis     Past Surgical History  Procedure Laterality Date  . Tubal ligation    . Cardiac ct  9/12    normal     Allergies: No Known Allergies  Medications: Current outpatient prescriptions:hydrochlorothiazide (HYDRODIURIL) 25 MG tablet, Take 1 tablet (25 mg total) by mouth daily., Disp: 90 tablet, Rfl: 3;  Multiple Vitamins-Minerals (CENTRUM PO), Take one by mouth daily , Disp: , Rfl: ;  Naproxen Sodium (ALEVE PO), Take 1 tablet by mouth as needed., Disp: , Rfl: ;  omeprazole (PRILOSEC) 40 MG capsule, Take 1 capsule (40 mg total) by mouth daily., Disp: 90 capsule, Rfl: 3 sertraline (ZOLOFT) 50 MG tablet, TAKE 1 TABLET  EVERY DAY, Disp: 90 tablet, Rfl: 3;  [DISCONTINUED] ranitidine (ZANTAC) 150 MG capsule, Take 1 capsule (150 mg total) by mouth 2 (two) times daily. For 3 months, Disp: 60 capsule, Rfl: 2   Social History:   reports that she has never smoked. She has never used smokeless tobacco. She reports that she does not drink alcohol or use illicit drugs. She is married with 2 female children and 1 female child and states that there is no history of any blood disease in her children.  Family History: Family History  Problem Relation Age of Onset  . Hypertension Mother   . Cancer Father     unknown type  . Colon cancer Neg Hx   . Esophageal cancer Neg Hx   . Rectal cancer Neg Hx   . Stomach cancer Neg Hx   Brother and sister has history of low platelets and WBC.  Review of Systems: 14 point review of system is as in the history above otherwise negative.   Physical Exam: Blood pressure 118/76, pulse 54, temperature 97.9 F (36.6 C), temperature source Oral, resp. rate 16, height 5' 2.5" (1.588 m), weight 179 lb 3.2 oz (81.285 kg). GENERAL: No distress .  SKIN:  No rashes or significant lesions , no ecchymosis or petechia or rash. HEAD: Normocephalic, No masses, lesions, tenderness or abnormalities  EYES: Conjunctiva  are pink , non-injected and no jaundice. ENT: External ears normal ,lips, buccal mucosa, and tongue normal and mucous membranes are moist . LYMPH: No palpable lymphadenopathy,  In the neck supraclavicular area or axilla or inguinal areas. LUNGS: Clear to auscultation , no crackles or wheezes HEART: regular rate & rhythm, no murmurs, no gallops, S1 normal and S2 normal  ABDOMEN: Abdomen soft, non-tender, normal bowel sounds, no masses or organomegaly and no hepatosplenomegaly palpable.  EXTREMITIES: No edema, no skin discoloration or tenderness. NEURO: Alert & oriented , no focal motor deficits.     Lab Results: Lab Results  Component Value Date   WBC 2.9* 05/31/2013   HGB 13.2  05/31/2013   HCT 39.5 05/31/2013   MCV 96.4 05/31/2013   PLT 101.0* 05/31/2013     Chemistry      Component Value Date/Time   NA 141 08/07/2012 0848   K 4.2 08/07/2012 0848   CL 105 08/07/2012 0848   CO2 31 08/07/2012 0848   BUN 14 08/07/2012 0848   CREATININE 0.8 08/07/2012 0848      Component Value Date/Time   CALCIUM 10.0 08/07/2012 0848   ALKPHOS 66 08/07/2012 0848   AST 23 08/07/2012 0848   ALT 20 08/07/2012 0848   BILITOT 0.5 08/07/2012 0848         Radiological Studies: No results found.    Impression: 1.  Mild leukopenia; this is mostly in my neutropenia which most likely is constitutional given that this has been long-standing since we could see from her historical CBC.  Besides she has not had any chronic or unusual infection and problem. Lack of B symptoms and family history supports constitution. 2. Mild thrombocytopenia : This is most likely constitutional even though mild ITP is hard to exclude.  However given that patient has maintained her platelet count mostly above 100,000 I doubt if any further workup is indicated at this time.  Recommendations: 1.  I ordered a CBC/CMP/LDH and review of peripheral blood smear by me to rule out any other abnormality.  If these  are negative, then this will make a strong case for constitutional etiology for mild neutropenia and thrombocytopenia.  2.  She'll return to clinic in 1 month to discuss these results.  At that time , all things considered patient could either be scheduled for 6 monthly -1 yearly clinic visit or follow up with Dr. Milinda Antis for repeat referral when new concerns arises.  All questions were satisfactorily answered.She knows to call if she has any concern.  I spent 50% of the time was spent counseling the patient face to face. The total time spent in the appointment was 45 minutes.  Thank you so much Dr Milinda Antis for this referral.   Sherral Hammers, MD FACP. Hematology/Oncology.     Addendum: Review of peripheral  blood smear showed unremarkable red blood cell morphology, giant platelets and a few mature neutrophils.  No no band forms, blasts or abnormal cells seen.

## 2013-06-09 NOTE — Patient Instructions (Addendum)
[facility] Discharge Instructions  RECOMMENDATIONS MADE BY THE CONSULTANT AND ANY TEST RESULTS WILL BE SENT TO YOUR REFERRING PHYSICIAN.  EXAM FINDINGS BY THE PHYSICIAN TODAY AND SIGNS OR SYMPTOMS TO REPORT TO CLINIC OR PRIMARY PHYSICIAN: Exam and discussion by Dr. Tona Sensing. MD does not feel that there is anything to be concerned about.  We will check some blood work today and will get a blood smear for Dr. Sharia Reeve to look at.  MEDICATIONS PRESCRIBED:  none  INSTRUCTIONS GIVEN AND DISCUSSED: Report unusual bruising or bleeding, fevers, night sweats, etc.  SPECIAL INSTRUCTIONS/FOLLOW-UP: Follow-up in 1 month.  Thank you for choosing [facility] to provide your oncology and hematology care.  To afford each patient quality time with our providers, please arrive at least 15 minutes before your scheduled appointment time.  With your help, our goal is to use those 15 minutes to complete the necessary work-up to ensure our physicians have the information they need to help with your evaluation and healthcare recommendations.    Effective January 1st, 2014, we ask that you re-schedule your appointment with our physicians should you arrive 10 or more minutes late for your appointment.  We strive to give you quality time with our providers, and arriving late affects you and other patients whose appointments are after yours.    Again, thank you for choosing [facility].  Our hope is that these requests will decrease the amount of time that you wait before being seen by our physicians.       _____________________________________________________________  Should you have questions after your visit to [facility], please contact our office at [phone] between the hours of 8:30 a.m. and 5:00 p.m.  Voicemails left after 4:30 p.m. will not be returned until the following business day.  For prescription refill requests, have your pharmacy contact  our office with your prescription refill request.

## 2013-06-30 ENCOUNTER — Other Ambulatory Visit: Payer: Medicare HMO | Admitting: Lab

## 2013-06-30 ENCOUNTER — Ambulatory Visit: Payer: Medicare HMO | Admitting: Oncology

## 2013-06-30 ENCOUNTER — Ambulatory Visit: Payer: Medicare HMO

## 2013-07-08 ENCOUNTER — Encounter (HOSPITAL_COMMUNITY): Payer: Medicare HMO | Attending: Hematology and Oncology

## 2013-07-08 ENCOUNTER — Encounter (HOSPITAL_COMMUNITY): Payer: Self-pay

## 2013-07-08 VITALS — BP 130/72 | HR 67 | Temp 97.9°F | Resp 16 | Wt 179.8 lb

## 2013-07-08 DIAGNOSIS — D696 Thrombocytopenia, unspecified: Secondary | ICD-10-CM

## 2013-07-08 DIAGNOSIS — D709 Neutropenia, unspecified: Secondary | ICD-10-CM

## 2013-07-08 NOTE — Progress Notes (Signed)
      [facility] Telephone:([phone]   [phone]  OFFICE PROGRESS NOTE  Roxy Manns, MD [address] [place] [address], [place] [address]  DIAGNOSIS:  1.Mild Neutropenia. 2.Mild Thrombocytopenia (possible ITP)    INTERVAL HISTORY:  Ms. Grandpre was seen in clinic initially on 05/23/2013 for evaluation of mild leucopenia and thrombocytopenia . Vitamin B12 level was normal at that time. Repeat CBC showed hemoglobin of 13.5, WBC of 2.1 K, and absolute neutrophil count of 1.2 K. Platelet count was 106K. Also review of peripheral blood smear was essentially unremarkable. She returns to the clinic today for  review these result and tells me she has not had any new problem since her last visit.  She denies fatigue, easy bruising or bleeding or infection.       PHYSICAL EXAMINATION:  Blood pressure 130/72, pulse 67, temperature 97.9 F (36.6 C), temperature source Oral, resp. rate 16, weight 179 lb 12.8 oz (81.557 kg). GENERAL: No acute distress. SKIN:  No rashes or significant lesions . No ecchymosis or petechial rash. NEURO: Alert & oriented , For physical exam deferred.    LABORATORY DATA: Lab Results  Component Value Date   WBC 3.1* 06/09/2013   HGB 13.5 06/09/2013   HCT 41.3 06/09/2013   MCV 96.0 06/09/2013   PLT 106* 06/09/2013      Chemistry      Component Value Date/Time   NA 139 06/09/2013 1153   K 4.2 06/09/2013 1153   CL 101 06/09/2013 1153   CO2 32 06/09/2013 1153   BUN 10 06/09/2013 1153   CREATININE 0.73 06/09/2013 1153      Component Value Date/Time   CALCIUM 10.9* 06/09/2013 1153   ALKPHOS 92 06/09/2013 1153   AST 27 06/09/2013 1153   ALT 25 06/09/2013 1153   BILITOT 0.4 06/09/2013 1153     Vit B12 726.   ASSESSMENT:  Ms. Salman has mild thrombocytopenia which could be mild ITP or consitutional and neutropenia which is most likely constitutional . At this time, there is no indication for additional workup or action.  Her blood counts are stable. We have elected to see her every 6 months with repeat CBCs.  Patient is asymptomatic in terms of anemia,bleeding or infection risk.   PLAN:  1. Return to clinic in 6 months. 2. CBC at the time of her return visits.   All questions were satisfactorily answered. Patient knows to call if  any concern arises.  I spent more than 50 % counseling the patient face to face. The total time spent in the appointment was 15 minutes.   Sherral Hammers, MD FACP. Hematology/Oncology.      If you have any

## 2013-07-08 NOTE — Patient Instructions (Addendum)
[facility] Discharge Instructions  RECOMMENDATIONS MADE BY THE CONSULTANT AND ANY TEST RESULTS WILL BE SENT TO YOUR REFERRING PHYSICIAN.  Exam good today. Return to clinic in 6 months to see MD. Report any issues/concerns to clinic as needed.  Thank you for choosing [facility] to provide your oncology and hematology care.  To afford each patient quality time with our providers, please arrive at least 15 minutes before your scheduled appointment time.  With your help, our goal is to use those 15 minutes to complete the necessary work-up to ensure our physicians have the information they need to help with your evaluation and healthcare recommendations.    Effective January 1st, 2014, we ask that you re-schedule your appointment with our physicians should you arrive 10 or more minutes late for your appointment.  We strive to give you quality time with our providers, and arriving late affects you and other patients whose appointments are after yours.    Again, thank you for choosing [facility].  Our hope is that these requests will decrease the amount of time that you wait before being seen by our physicians.       _____________________________________________________________  Should you have questions after your visit to [facility], please contact our office at [phone] between the hours of 8:30 a.m. and 5:00 p.m.  Voicemails left after 4:30 p.m. will not be returned until the following business day.  For prescription refill requests, have your pharmacy contact our office with your prescription refill request.

## 2013-12-01 ENCOUNTER — Encounter: Payer: Self-pay | Admitting: Gastroenterology

## 2013-12-01 ENCOUNTER — Ambulatory Visit (INDEPENDENT_AMBULATORY_CARE_PROVIDER_SITE_OTHER): Payer: Medicare HMO | Admitting: Gastroenterology

## 2013-12-01 VITALS — BP 120/74 | HR 76 | Ht 62.5 in | Wt 177.8 lb

## 2013-12-01 DIAGNOSIS — K219 Gastro-esophageal reflux disease without esophagitis: Secondary | ICD-10-CM

## 2013-12-01 MED ORDER — OMEPRAZOLE 40 MG PO CPDR: 40.0000 mg | DELAYED_RELEASE_CAPSULE | Freq: Every day | ORAL | Status: DC

## 2013-12-01 NOTE — Progress Notes (Signed)
    History of Present Illness: This is a 68 year old female with a history of GERD. She underwent upper endoscopy in 2008 showing an esophageal stricture, gastritis and hiatal hernia. She has not generally taken any medication for acid reflux over the past several years. She does have episodes of reflux with occasional regurgitation and vomiting. She began taking over-the-counter Nexium for the past several weeks and her symptoms have resolved.  Current Medications, Allergies, Past Medical History, Past Surgical History, Family History and Social History were reviewed in [employer] record.  Physical Exam: General: Well developed , well nourished, no acute distress Head: Normocephalic and atraumatic Eyes:  sclerae anicteric, EOMI Ears: Normal auditory acuity Mouth: No deformity or lesions Lungs: Clear throughout to auscultation Heart: Regular rate and rhythm; no murmurs, rubs or bruits Abdomen: Soft, non tender and non distended. No masses, hepatosplenomegaly or hernias noted. Normal Bowel sounds Musculoskeletal: Symmetrical with no gross deformities  Pulses:  Normal pulses noted Extremities: No clubbing, cyanosis, edema or deformities noted Neurological: Alert oriented x 4, grossly nonfocal Psychological:  Alert and cooperative. Normal mood and affect  Assessment and Recommendations:  1. GERD. Begin omeprazole 40 mg daily for long-term use however if Nexium 20 mg daily is less expensive she may use it that daily for long-term. Standard antireflux measures.

## 2013-12-01 NOTE — Patient Instructions (Signed)
We have sent the following medications to your pharmacy for you to pick up at your convenience: Omeprazole 40 mg daily. If the Nexium over the counter is cheaper then you may continue that medication instead. Make sure you take either one long-term.   Patient advised to avoid spicy, acidic, citrus, chocolate, mints, fruit and fruit juices.  Limit the intake of caffeine, alcohol and Soda.  Don't exercise too soon after eating.  Don't lie down within 3-4 hours of eating.  Elevate the head of your bed.  Thank you for choosing me and [facility].  Pricilla Riffle. Dagoberto Ligas., MD., Marval Regal

## 2013-12-13 ENCOUNTER — Other Ambulatory Visit: Payer: Self-pay

## 2013-12-13 DIAGNOSIS — Z1231 Encounter for screening mammogram for malignant neoplasm of breast: Secondary | ICD-10-CM

## 2013-12-27 ENCOUNTER — Ambulatory Visit
Admission: RE | Admit: 2013-12-27 | Discharge: 2013-12-27 | Disposition: A | Discharge status: Home / Self Care | Payer: Medicare PPO | Source: Ambulatory Visit

## 2013-12-27 DIAGNOSIS — Z1231 Encounter for screening mammogram for malignant neoplasm of breast: Secondary | ICD-10-CM

## 2013-12-29 ENCOUNTER — Encounter: Payer: Self-pay | Admitting: *Deleted

## 2014-01-09 NOTE — Progress Notes (Signed)
Kathryn Pardon, MD [place] [address]  THROMBOCYTOPENIA - Plan: CBC with Differential, Iron and TIBC, Ferritin, Hepatitis panel, acute, HIV antibody, Platelet antibodies pnl(drct+indrct)  LEUKOCYTOPENIA UNSPECIFIED - Plan: CBC with Differential  Splenic lesion - Plan: Angiotensin converting enzyme  CURRENT THERAPY: Observation following a negative work-up  INTERVAL HISTORY: Kathryn Weaver 68 y.o. female returns for  regular  visit for followup of leukopenia with neutropenia and thrombocytopenia that is at a safe level.     I personally reviewed and went over laboratory results with the patient.  The results are noted within this dictation.  Neutropenia is noted to at least 2011 in [facility].  Additionally, thrombocytopenia is noted back to at least 2009 in [facility].  On a separate note, the patient's Hgb has been WNL since 2010 without any clear of chronic treatment (ie B12 injections, ferrous sulfate, etc) and therefore anemia has been resolved from the patient's problem list.  From a hematologic standpoint, there are only a few causes of Leukopenia and Thrombocytopenia that should be ruled out such as HIV antibody and Hepatitis panel.  Additionally, platelet antibodies (direct and indirect) are important to evaluate for chronic ITP of adults. In addition, iron studies should be performed as inadequate iron stores can affect platelet count.  I personally reviewed and went over radiographic studies with the patient.  The results are noted within this dictation.  Screening mammogram on 12/29/13 was BIRADS 1 and therefore, she will be due for her next screening mammogram in Feb 2016.    I had a frank conversation with the patient regarding her laboratory work.  Leukopenia, neutropenia, and platelet count have been very stable for a number of years. She denies any illnesses or sickness is requiring hospitalization or antibiotics. She feels well. She denies  any B. symptoms including fevers, chills, night sweats, Kathryn satiety, and unintentional weight loss. She looks healthy. I've given her the option regarding how aggressive she would like [place] to be in finding answers for her abnormal blood counts. She admits that she is not very interested in aggressive intervention for diagnostic testing. She reports that both her brother and her sister had a very similar issues and they've been released from physicians following these disorders. She doesn't know the details regarding this.  As a result, she is hinted at the fact that she would like to be released in the future.  I provided her information regarding signs or symptoms she should report to [place] immediately including uncontrolled bleeding, spontaneous bleeding, hemoptysis, blood in stool, black tarry stool, recurrent infections requiring antibiotics, etc.  Hematologically, the patient denies any complaints and ROS questioning is negative.  Please see therapy plan below for details regarding further workup in the future if patient desires or if issues present themselves.  Patient was provided education regarding benign ethnic leukopenia as well.  She may have an element of this disorder as well:  Benign ethnic neutropenia-The condition of benign ethnic neutropenia (BEN), also called benign familial neutropenia, constitutional neutropenia, or benign familial leukopenia, is an inherited neutropenia that occurs in individuals of African descent, as well as some other ethnic groups (eg, certain Sephardic Jews, [place], [place], and Arab [place]ians). The neutropenia is often mild. Absolute neutrophil counts (ANCs) are most commonly greater than 1200 but may occasionally be less than 1000.  Notably, these individuals have normal bone marrow neutrophil reserve; they do not have an increased incidence of infection despite  the lack of leukocytosis during infection in some individuals, and they do not have an  increased risk of febrile neutropenia from chemotherapy.  The genetic basis for BEN is a polymorphism in the Duffy antigen receptor chemokine [facility]) gene, which encodes the Duffy antigen, a chemokine receptor expressed on the surface of red blood cells (RBCs).  In blood bank records, the Duffy antigen is denoted Fy(ab). Since the receptor is used by malaria parasites to enter RBCs, this polymorphism is protective against malaria; protection from malaria likely explains the high frequency of BEN in affected populations.  The mechanism of neutropenia in patients with BEN is not well understood, nor is the link between [facility] polymorphisms and neutrophil mass. Neutropenia in individuals with BEN may be due to reduced stimulation of neutrophil production, lack of a storage pool of neutrophils, and/or reduced neutrophil chemotaxis. The null allele of the [facility] gene responsible for BEN results in lack of expression of this chemokine receptor on RBCs and vascular cells; the Duffy antigen is not normally expressed on neutrophils. Lack of Duffy expression may cause reduced clearance of pro-inflammatory cytokines that would normally be taken up by RBCs expressing the [facility] receptor and reduced neutrophil chemotaxis due to lack of Duffy antigen expression on endothelial cells.  Individuals who are heterozygous for a null allele at the [facility] locus have mean neutrophil counts similar to those of other ethnic ancestries. Homozygotes for the null allele have a mean neutrophil count that is shifted lower than the range in individuals without the mutation. The shift in the distribution curve of neutrophil counts for the population means that some individuals have an [place] that falls below the threshold of 1500.  Because approximately 80 percent of African Blacks are homozygous for the null allele, 10 to 15 percent of these individuals are classified as neutropenic based on ANC. Homozygosity for the null allele is seen in approximately 36  percent of [place]ites. Allele testing for Duffy antigen can be used to confirm a diagnosis of BEN. Benign constitutional neutropenia without DARC polymorphisms also occurs; its mechanism is totally unknown. Patients with a longstanding history of an [place] between 1000 and 1500 have a very benign course, and evaluation of such patients with extensive studies including bone marrow examination is almost uniformly unrevealing. There is no obvious "threshold" [place] below which we feel compelled to look for other sources of neutropenia besides BEN. The best factor determining one's concern for another etiology is the patient's own history of infections and associated conditions, when available. Most hematologists would perform a bone marrow examination on patients with an [place] below 800 however, an [place] of 800 for many years in a completely healthy individual is consistent with BEN.  Of note, it is possible for an individual with BEN to develop an abnormality of neutrophil production such as those described in the following sections. Such an abnormality should be suspected in an individual with BEN if a stable neutrophil count decreases dramatically.    Past Medical History  Diagnosis Date  . Hypertension   . Anemia     s/p GI bleed, hospital 5/08  . Adenomatous polyp of colon 03/2007  . GERD (gastroesophageal reflux disease)   . Hiatal hernia     EGD 5/08- stricture, HH, gastritis, GERD  . Depression   . Hypertension   . Endometriosis     uterine US 06/2003  . Diverticulosis     has THROMBOCYTOPENIA; DEPRESSION; HYPERTENSION; GERD; DIVERTICULAR DISEASE; POSTMENOPAUSAL STATUS; TRANSAMINASES, SERUM, ELEVATED; HX, PERSONAL, COLONIC  POLYPS; LEUKOCYTOPENIA UNSPECIFIED; Chest pain; Obesity; Routine general medical examination at a health care facility; Hyperlipidemia; Abdominal pain; Pain of right leg; Numbness of toes; Intolerance to fatty food; Stress reaction; and Esophageal dysphagia on her problem list.      has No Known Allergies.  Kathryn Weaver had no medications administered during this visit.  Past Surgical History  Procedure Laterality Date  . Tubal ligation    . Cardiac ct  9/12    normal     Denies any headaches, dizziness, double vision, fevers, chills, night sweats, nausea, vomiting, diarrhea, constipation, chest pain, heart palpitations, shortness of breath, blood in stool, black tarry stool, urinary pain, urinary burning, urinary frequency, hematuria.   PHYSICAL EXAMINATION  ECOG PERFORMANCE STATUS: 0 - Asymptomatic  Filed Vitals:   01/10/14 1000  BP: 112/68  Pulse: 65  Temp: 97.6 F (36.4 C)  Resp: 16    GENERAL:alert, healthy, no distress, well nourished, well developed, comfortable, cooperative and smiling SKIN: skin color, texture, turgor are normal, no rashes or significant lesions HEAD: Normocephalic, No masses, lesions, tenderness or abnormalities EYES: normal, PERRLA, EOMI, Conjunctiva are pink and non-injected EARS: External ears normal OROPHARYNX:mucous membranes are moist  NECK: supple, trachea midline LYMPH:  not examined BREAST:not examined LUNGS: clear to auscultation  HEART: regular rate & rhythm, no murmurs and no gallops ABDOMEN:abdomen soft, non-tender and normal bowel sounds BACK: Back symmetric, no curvature. EXTREMITIES:less then 2 second capillary refill, no joint deformities, effusion, or inflammation, no edema, no skin discoloration, no clubbing, no cyanosis  NEURO: alert & oriented x 3 with fluent speech, no focal motor/sensory deficits, gait normal    LABORATORY DATA: CBC    Component Value Date/Time   WBC 3.1* 06/09/2013 1153   RBC 4.30 06/09/2013 1153   HGB 13.5 06/09/2013 1153   HCT 41.3 06/09/2013 1153   PLT 106* 06/09/2013 1153   MCV 96.0 06/09/2013 1153   MCH 31.4 06/09/2013 1153   MCHC 32.7 06/09/2013 1153   RDW 12.5 06/09/2013 1153   LYMPHSABS 1.5 06/09/2013 1153   MONOABS 0.3 06/09/2013 1153   EOSABS 0.0 06/09/2013 1153   BASOSABS  0.0 06/09/2013 1153   Results for GABBY, RACKERS (MRN [id number]) as of 01/09/2014 20:32  Ref. Range 08/09/2008 11:15 10/20/2009 10:52 10/24/2009 09:50 10/22/2010 10:16 07/29/2011 15:11 04/21/2012 08:43 08/07/2012 08:48 05/31/2013 09:32 06/09/2013 11:53  Platelets Latest Range: 150-400 K/uL 140 (L) 93.0 (L) 107.0 (L) 120.0 (L) 110 (L) 126.0 (L) 126.0 (L) 101.0 (L) 106 (L)   Results for BRITTANEY, BEAULIEU (MRN [id number]) as of 01/09/2014 20:32  Ref. Range 04/28/2007 09:49 08/09/2008 11:15 10/20/2009 10:52 10/24/2009 09:50 10/22/2010 10:16 07/29/2011 15:11 04/21/2012 08:43 08/07/2012 08:48 05/31/2013 09:32 06/09/2013 11:53  WBC Latest Range: 4.0-10.5 K/uL 3.2 (L) 3.7 (L) 3.8 (L) 3.3 (L) 2.6 (L) 4.1 3.5 (L) 2.8 (L) 2.9 (L) 3.1 (L)   Results for DOROTA, HEINRICHS (MRN [id number]) as of 01/09/2014 20:32  Ref. Range 04/28/2007 09:49 08/09/2008 11:15 10/20/2009 10:52 10/24/2009 09:50 10/22/2010 10:16 07/29/2011 15:11 04/21/2012 08:43 08/07/2012 08:48 05/31/2013 09:32 06/09/2013 11:53  NEUT# Latest Range: 1.7-7.7 K/uL 1.5 1.7   1.0 (L) 2.9 1.3 (L) 1.2 (L) 1.2 (L) 1.2 (L)     PENDING LABS: CBC diff, HIV antibody, Acute hepatitis panel, and platelet antibodies (direct and indirect), iron/TIBC, ferritin, ACE-level.   RADIOGRAPHIC STUDIES:  12/29/2013  CLINICAL DATA: Screening.  EXAM:  DIGITAL SCREENING BILATERAL MAMMOGRAM WITH CAD  COMPARISON: Previous exam(s).  ACR Breast Density Category b: There are scattered  areas of  fibroglandular density.  FINDINGS:  There are no findings suspicious for malignancy. Images were  processed with CAD.  IMPRESSION:  No mammographic evidence of malignancy. A result letter of this  screening mammogram will be mailed directly to the patient.  RECOMMENDATION:  Screening mammogram in one year. (Code:SM-B-01Y)  BI-RADS CATEGORY 1: Negative.  Electronically Signed  By: Lovey Newcomer M.D.  On: 12/27/2013 15:38    ASSESSMENT:  1. Leukopenia with neutropenia.  Present dating back to at least  2011 in [facility].  Very stable without any increased infections or antibiotic requirements. Etiology unknown at this time (negative work-up thus far). 2. Thrombocytopenia, stable and at safe level dating back to 2009 in [facility].  Etiology unknown at this time (negative work-up thus far). 3. No anemia since 2009.  Problem list updated as a result. 4. Kathryn MDS remains on differential secondary to #1 and #2.  If #1 and #2 worsen, bone marrow aspiration and biopsy would be indicated.  5. Splenic granulomata, etiology unknown but differential is large.  Will perform ACE level today to rule out sarcoidosis.  Patient Active Problem List   Diagnosis Date Noted  . Esophageal dysphagia 01/05/2013  . Stress reaction 09/01/2012  . Intolerance to fatty food 08/11/2012  . Abdominal pain 08/07/2012  . Pain of right leg 08/07/2012  . Numbness of toes 08/07/2012  . Hyperlipidemia 04/28/2012  . Routine general medical examination at a health care facility 04/20/2012  . Obesity 02/05/2012  . Chest pain 08/02/2011  . LEUKOCYTOPENIA UNSPECIFIED 10/23/2010  . THROMBOCYTOPENIA 10/24/2009  . TRANSAMINASES, SERUM, ELEVATED 10/24/2009  . POSTMENOPAUSAL STATUS 08/09/2008  . DEPRESSION 08/05/2007  . HYPERTENSION 08/05/2007  . GERD 04/20/2007  . DIVERTICULAR DISEASE 04/20/2007  . HX, PERSONAL, COLONIC POLYPS 04/13/2007     PLAN:  1. I personally reviewed and went over laboratory results with the patient.  The results are noted within this dictation. 2. I personally reviewed and went over radiographic studies with the patient.  The results are noted within this dictation.   3. Next screening mammogram is due in Feb 2016 4. Labs today: CBC diff, HIV antibody, Acute hepatitis panel, platelet antibody (direct and indirect), iron/TIBC, ferritin, ACE-level. 5. Labs in 6 months: CBC diff 6. Patient education regarding benign ethnic leukopenia.  7. Return in 6 months for follow-up.  If all is well, we can consider discharge  from the clinic and for to follow-up with PCP.   THERAPY PLAN:  Hematologically, she denies any complaints and her counts are stable.  Labs are pending today.  She knows to call the clinic with any clinical changes indicative of low platelet count and low WBC count.  In the future, a nuclear medicine liver/spleen scan can be performed to evaluate for any vascular causes of her blood count issues. Additionally, a bone marrow aspiration and biopsy may be warranted in the future if necessary.  Additionally, she could have an element of benign ethnic leukopenia.  All questions were answered. The patient knows to call the clinic with any problems, questions or concerns. We can certainly see the patient much sooner if necessary.  Patient and plan discussed with Dr. Farrel Gobble and he is in agreement with the aforementioned.   Jowan Skillin

## 2014-01-10 ENCOUNTER — Encounter (HOSPITAL_COMMUNITY): Payer: Medicare HMO

## 2014-01-10 ENCOUNTER — Encounter (HOSPITAL_COMMUNITY): Payer: Self-pay | Admitting: Oncology

## 2014-01-10 ENCOUNTER — Encounter (HOSPITAL_COMMUNITY): Payer: Medicare HMO | Attending: Oncology | Admitting: Oncology

## 2014-01-10 VITALS — BP 112/68 | HR 65 | Temp 97.6°F | Resp 16 | Wt 179.7 lb

## 2014-01-10 DIAGNOSIS — D72819 Decreased white blood cell count, unspecified: Secondary | ICD-10-CM

## 2014-01-10 DIAGNOSIS — I10 Essential (primary) hypertension: Secondary | ICD-10-CM | POA: Insufficient documentation

## 2014-01-10 DIAGNOSIS — K219 Gastro-esophageal reflux disease without esophagitis: Secondary | ICD-10-CM | POA: Insufficient documentation

## 2014-01-10 DIAGNOSIS — F3289 Other specified depressive episodes: Secondary | ICD-10-CM | POA: Insufficient documentation

## 2014-01-10 DIAGNOSIS — D696 Thrombocytopenia, unspecified: Secondary | ICD-10-CM | POA: Insufficient documentation

## 2014-01-10 DIAGNOSIS — D739 Disease of spleen, unspecified: Secondary | ICD-10-CM

## 2014-01-10 DIAGNOSIS — F329 Major depressive disorder, single episode, unspecified: Secondary | ICD-10-CM | POA: Insufficient documentation

## 2014-01-10 DIAGNOSIS — D709 Neutropenia, unspecified: Secondary | ICD-10-CM | POA: Insufficient documentation

## 2014-01-10 DIAGNOSIS — K5730 Diverticulosis of large intestine without perforation or abscess without bleeding: Secondary | ICD-10-CM | POA: Insufficient documentation

## 2014-01-10 DIAGNOSIS — Z09 Encounter for follow-up examination after completed treatment for conditions other than malignant neoplasm: Secondary | ICD-10-CM | POA: Insufficient documentation

## 2014-01-10 DIAGNOSIS — D469 Myelodysplastic syndrome, unspecified: Secondary | ICD-10-CM

## 2014-01-10 DIAGNOSIS — D7389 Other diseases of spleen: Secondary | ICD-10-CM

## 2014-01-10 LAB — CBC WITH DIFFERENTIAL/PLATELET
Basophils Absolute: 0 10*3/uL (ref 0.0–0.1)
Basophils Relative: 0 % (ref 0–1)
Eosinophils Absolute: 0 10*3/uL (ref 0.0–0.7)
Eosinophils Relative: 1 % (ref 0–5)
HCT: 40.4 % (ref 36.0–46.0)
Hemoglobin: 13.4 g/dL (ref 12.0–15.0)
LYMPHS ABS: 1.1 10*3/uL (ref 0.7–4.0)
LYMPHS PCT: 46 % (ref 12–46)
MCH: 31.8 pg (ref 26.0–34.0)
MCHC: 33.2 g/dL (ref 30.0–36.0)
MCV: 95.7 fL (ref 78.0–100.0)
Monocytes Absolute: 0.2 10*3/uL (ref 0.1–1.0)
Monocytes Relative: 9 % (ref 3–12)
NEUTROS ABS: 1.1 10*3/uL — AB (ref 1.7–7.7)
NEUTROS PCT: 44 % (ref 43–77)
PLATELETS: 104 10*3/uL — AB (ref 150–400)
RBC: 4.22 MIL/uL (ref 3.87–5.11)
RDW: 13.2 % (ref 11.5–15.5)
WBC: 2.5 10*3/uL — AB (ref 4.0–10.5)

## 2014-01-10 LAB — FERRITIN: FERRITIN: 20 ng/mL (ref 10–291)

## 2014-01-10 LAB — HIV ANTIBODY (ROUTINE TESTING W REFLEX): HIV: NONREACTIVE

## 2014-01-10 LAB — HEPATITIS PANEL, ACUTE
HCV Ab: NEGATIVE
Hep A IgM: NONREACTIVE
Hep B C IgM: NONREACTIVE
Hepatitis B Surface Ag: NEGATIVE

## 2014-01-10 LAB — IRON AND TIBC
IRON: 87 ug/dL (ref 42–135)
Saturation Ratios: 23 % (ref 20–55)
TIBC: 377 ug/dL (ref 250–470)
UIBC: 290 ug/dL (ref 125–400)

## 2014-01-10 LAB — ANGIOTENSIN CONVERTING ENZYME: Angiotensin-Converting Enzyme: 25 U/L (ref 8–52)

## 2014-01-10 NOTE — Addendum Note (Signed)
Addended by: Mellissa Kohut on: 01/10/2014 11:03 AM   Modules accepted: Orders

## 2014-01-10 NOTE — Progress Notes (Unsigned)
Early Chars presented for labwork at 10:45am Labs per MD order drawn via Peripheral Line 23 gauge needle inserted in right AC  Good blood return present. Procedure without incident.  Needle removed intact. Patient tolerated procedure well.

## 2014-01-10 NOTE — Addendum Note (Signed)
Addended by: Mellissa Kohut on: 01/10/2014 01:19 PM   Modules accepted: Orders

## 2014-01-10 NOTE — Patient Instructions (Signed)
[facility] Discharge Instructions  RECOMMENDATIONS MADE BY THE CONSULTANT AND ANY TEST RESULTS WILL BE SENT TO YOUR REFERRING PHYSICIAN.  EXAM FINDINGS BY THE PHYSICIAN TODAY AND SIGNS OR SYMPTOMS TO REPORT TO CLINIC OR PRIMARY PHYSICIAN: Exam and findings as discussed by Robynn Pane, PA-C.  Need to check some additional blood work today.  If there's anything of concern we will contact you.  Report unusual bruising or bleeding.  MEDICATIONS PRESCRIBED:  none  INSTRUCTIONS/FOLLOW-UP: Follow-up in 6 months.  Thank you for choosing [facility] to provide your oncology and hematology care.  To afford each patient quality time with our providers, please arrive at least 15 minutes before your scheduled appointment time.  With your help, our goal is to use those 15 minutes to complete the necessary work-up to ensure our physicians have the information they need to help with your evaluation and healthcare recommendations.    Effective January 1st, 2014, we ask that you re-schedule your appointment with our physicians should you arrive 10 or more minutes late for your appointment.  We strive to give you quality time with our providers, and arriving late affects you and other patients whose appointments are after yours.    Again, thank you for choosing [facility].  Our hope is that these requests will decrease the amount of time that you wait before being seen by our physicians.       _____________________________________________________________  Should you have questions after your visit to [facility], please contact our office at [phone] between the hours of 8:30 a.m. and 5:00 p.m.  Voicemails left after 4:30 p.m. will not be returned until the following business day.  For prescription refill requests, have your pharmacy contact our office with your prescription refill request.

## 2014-01-13 LAB — PLATELET AB, INDIRECT, IGG

## 2014-01-15 LAB — PLATELET ANTIBODIES, DIRECT

## 2014-01-21 ENCOUNTER — Other Ambulatory Visit (HOSPITAL_COMMUNITY): Payer: Self-pay | Admitting: Oncology

## 2014-01-21 DIAGNOSIS — E611 Iron deficiency: Secondary | ICD-10-CM

## 2014-01-25 ENCOUNTER — Encounter (HOSPITAL_BASED_OUTPATIENT_CLINIC_OR_DEPARTMENT_OTHER): Payer: Medicare HMO

## 2014-01-25 VITALS — BP 126/58 | HR 48 | Temp 97.6°F | Resp 16

## 2014-01-25 DIAGNOSIS — D469 Myelodysplastic syndrome, unspecified: Secondary | ICD-10-CM

## 2014-01-25 DIAGNOSIS — E611 Iron deficiency: Secondary | ICD-10-CM

## 2014-01-25 MED ORDER — SODIUM CHLORIDE 0.9 % IV SOLN
1020.0000 mg | Freq: Once | INTRAVENOUS | Status: AC
  Administered 2014-01-25: 1020 mg via INTRAVENOUS
  Filled 2014-01-25: qty 34

## 2014-01-25 MED ORDER — SODIUM CHLORIDE 0.9 % IV SOLN
Freq: Once | INTRAVENOUS | Status: AC
Start: 2014-01-25 — End: 2014-01-25
  Administered 2014-01-25: 11:00:00 via INTRAVENOUS

## 2014-01-25 NOTE — Progress Notes (Signed)
Tolerated Feraheme infusion without any complaints or s/s adverse reaction.

## 2014-02-08 ENCOUNTER — Other Ambulatory Visit: Payer: Medicare HMO

## 2014-02-16 ENCOUNTER — Encounter: Payer: Medicare HMO | Admitting: Family Medicine

## 2014-02-19 ENCOUNTER — Telehealth: Payer: Self-pay | Admitting: Family Medicine

## 2014-02-19 DIAGNOSIS — I10 Essential (primary) hypertension: Secondary | ICD-10-CM

## 2014-02-19 DIAGNOSIS — R7402 Elevation of levels of lactic acid dehydrogenase (LDH): Secondary | ICD-10-CM

## 2014-02-19 DIAGNOSIS — D696 Thrombocytopenia, unspecified: Secondary | ICD-10-CM

## 2014-02-19 DIAGNOSIS — R740 Nonspecific elevation of levels of transaminase and lactic acid dehydrogenase [LDH]: Secondary | ICD-10-CM

## 2014-02-19 DIAGNOSIS — E785 Hyperlipidemia, unspecified: Secondary | ICD-10-CM

## 2014-02-19 DIAGNOSIS — R7401 Elevation of levels of liver transaminase levels: Secondary | ICD-10-CM

## 2014-02-19 NOTE — Telephone Encounter (Signed)
Message copied by Loura Pardon A on Sat Feb 19, 2014  9:15 AM ------      Message from: Ellamae Sia      Created: Thu Feb 10, 2014 11:22 AM      Regarding: Lab orders for Monday, 4.13.15       Patient is scheduled for CPX labs, please order future labs, Thanks , Terri       ------

## 2014-02-21 ENCOUNTER — Other Ambulatory Visit (INDEPENDENT_AMBULATORY_CARE_PROVIDER_SITE_OTHER): Payer: Medicare HMO

## 2014-02-21 DIAGNOSIS — D696 Thrombocytopenia, unspecified: Secondary | ICD-10-CM

## 2014-02-21 DIAGNOSIS — D72819 Decreased white blood cell count, unspecified: Secondary | ICD-10-CM

## 2014-02-21 DIAGNOSIS — R7401 Elevation of levels of liver transaminase levels: Secondary | ICD-10-CM

## 2014-02-21 DIAGNOSIS — R740 Nonspecific elevation of levels of transaminase and lactic acid dehydrogenase [LDH]: Secondary | ICD-10-CM

## 2014-02-21 DIAGNOSIS — R7402 Elevation of levels of lactic acid dehydrogenase (LDH): Secondary | ICD-10-CM

## 2014-02-21 DIAGNOSIS — I10 Essential (primary) hypertension: Secondary | ICD-10-CM

## 2014-02-21 DIAGNOSIS — E785 Hyperlipidemia, unspecified: Secondary | ICD-10-CM

## 2014-02-21 LAB — CBC WITH DIFFERENTIAL/PLATELET
BASOS ABS: 0 10*3/uL (ref 0.0–0.1)
Basophils Relative: 0.5 % (ref 0.0–3.0)
EOS ABS: 0 10*3/uL (ref 0.0–0.7)
Eosinophils Relative: 1 % (ref 0.0–5.0)
HCT: 39 % (ref 36.0–46.0)
Hemoglobin: 13 g/dL (ref 12.0–15.0)
LYMPHS PCT: 45.7 % (ref 12.0–46.0)
Lymphs Abs: 1.4 10*3/uL (ref 0.7–4.0)
MCHC: 33.4 g/dL (ref 30.0–36.0)
MCV: 96.3 fl (ref 78.0–100.0)
Monocytes Absolute: 0.4 10*3/uL (ref 0.1–1.0)
Monocytes Relative: 13.2 % — ABNORMAL HIGH (ref 3.0–12.0)
NEUTROS ABS: 1.2 10*3/uL — AB (ref 1.4–7.7)
NEUTROS PCT: 39.6 % — AB (ref 43.0–77.0)
Platelets: 93 10*3/uL — ABNORMAL LOW (ref 150.0–400.0)
RBC: 4.05 Mil/uL (ref 3.87–5.11)
RDW: 14.9 % — AB (ref 11.5–14.6)
WBC: 3.1 10*3/uL — ABNORMAL LOW (ref 4.5–10.5)

## 2014-02-21 LAB — COMPREHENSIVE METABOLIC PANEL
ALT: 37 U/L — AB (ref 0–35)
AST: 31 U/L (ref 0–37)
Albumin: 3.5 g/dL (ref 3.5–5.2)
Alkaline Phosphatase: 74 U/L (ref 39–117)
BUN: 17 mg/dL (ref 6–23)
CO2: 30 mEq/L (ref 19–32)
CREATININE: 0.7 mg/dL (ref 0.4–1.2)
Calcium: 10.3 mg/dL (ref 8.4–10.5)
Chloride: 105 mEq/L (ref 96–112)
GFR: 100.75 mL/min (ref >60.00)
Glucose, Bld: 99 mg/dL (ref 70–99)
POTASSIUM: 4 meq/L (ref 3.5–5.1)
Sodium: 143 mEq/L (ref 135–145)
Total Bilirubin: 0.4 mg/dL (ref 0.3–1.2)
Total Protein: 7.7 g/dL (ref 6.0–8.3)

## 2014-02-21 LAB — TSH: TSH: 1.09 u[IU]/mL (ref 0.35–5.50)

## 2014-02-21 LAB — LIPID PANEL
CHOL/HDL RATIO: 9
Cholesterol: 238 mg/dL — ABNORMAL HIGH (ref 0–200)
HDL: 26.4 mg/dL — AB (ref >39.00)
LDL CALC: 199 mg/dL — AB (ref 0–99)
Triglycerides: 65 mg/dL (ref 0.0–149.0)
VLDL: 13 mg/dL (ref 0.0–40.0)

## 2014-02-24 ENCOUNTER — Other Ambulatory Visit: Payer: Medicare HMO

## 2014-03-01 ENCOUNTER — Encounter: Payer: Self-pay | Admitting: Family Medicine

## 2014-03-01 ENCOUNTER — Ambulatory Visit (INDEPENDENT_AMBULATORY_CARE_PROVIDER_SITE_OTHER): Payer: Medicare PPO | Admitting: Family Medicine

## 2014-03-01 VITALS — BP 126/68 | HR 54 | Temp 97.7°F | Ht 63.0 in | Wt 178.8 lb

## 2014-03-01 DIAGNOSIS — N814 Uterovaginal prolapse, unspecified: Secondary | ICD-10-CM

## 2014-03-01 DIAGNOSIS — E785 Hyperlipidemia, unspecified: Secondary | ICD-10-CM

## 2014-03-01 DIAGNOSIS — Z1231 Encounter for screening mammogram for malignant neoplasm of breast: Secondary | ICD-10-CM | POA: Insufficient documentation

## 2014-03-01 DIAGNOSIS — Z0000 Encounter for general adult medical examination without abnormal findings: Secondary | ICD-10-CM

## 2014-03-01 DIAGNOSIS — Z23 Encounter for immunization: Secondary | ICD-10-CM

## 2014-03-01 DIAGNOSIS — I10 Essential (primary) hypertension: Secondary | ICD-10-CM

## 2014-03-01 DIAGNOSIS — E669 Obesity, unspecified: Secondary | ICD-10-CM

## 2014-03-01 MED ORDER — SERTRALINE HCL 50 MG PO TABS: ORAL_TABLET | ORAL | Status: DC

## 2014-03-01 MED ORDER — HYDROCHLOROTHIAZIDE 25 MG PO TABS: 25.0000 mg | ORAL_TABLET | Freq: Every day | ORAL | Status: DC

## 2014-03-01 NOTE — Assessment & Plan Note (Signed)
Reviewed health habits including diet and exercise and skin cancer prevention Reviewed appropriate screening tests for age  Also reviewed health mt list, fam hx and immunization status , as well as social and family history   Pneumovax today Declines zoster vaccine  Labs reviewed See HPI

## 2014-03-01 NOTE — Assessment & Plan Note (Signed)
Notable on exam and bothersome to pt  ? If she would be a candidate for a pessary  Ref to gyn for further eval and tx

## 2014-03-01 NOTE — Assessment & Plan Note (Signed)
Discussed how this problem influences overall health and the risks it imposes  Reviewed plan for weight loss with lower calorie diet (via better food choices and also portion control or program like weight watchers) and exercise building up to or more than 30 minutes 5 days per week including some aerobic activity    

## 2014-03-01 NOTE — Assessment & Plan Note (Signed)
LDL is way up and HDL very low Disc goals for lipids and reasons to control them Rev labs with pt Rev low sat fat diet in detail May need med  Re check 3 mo after diet change and f/u

## 2014-03-01 NOTE — Patient Instructions (Signed)
Pneumonia vaccine (pneumovax today) For cholesterol Avoid red meat/ fried foods/ egg yolks/ fatty breakfast meats/ butter, cheese and high fat dairy/ and shellfish   You have a uterine prolapse and I want to you see a gyn doctor to see how they can help you with that  Stop up front for a referral to gyn   Watch your diet for cholesterol Then schedule f/u with fasting labs prior in 3 months   Fat and Cholesterol Control Diet Fat and cholesterol levels in your blood and organs are influenced by your diet. High levels of fat and cholesterol may lead to diseases of the heart, small and large blood vessels, gallbladder, liver, and pancreas. CONTROLLING FAT AND CHOLESTEROL WITH DIET Although exercise and lifestyle factors are important, your diet is key. That is because certain foods are known to raise cholesterol and others to lower it. The goal is to balance foods for their effect on cholesterol and more importantly, to replace saturated and trans fat with other types of fat, such as monounsaturated fat, polyunsaturated fat, and omega-3 fatty acids. On average, a person should consume no more than 15 to 17 g of saturated fat daily. Saturated and trans fats are considered "bad" fats, and they will raise LDL cholesterol. Saturated fats are primarily found in animal products such as meats, butter, and cream. However, that does not mean you need to give up all your favorite foods. Today, there are good tasting, low-fat, low-cholesterol substitutes for most of the things you like to eat. Choose low-fat or nonfat alternatives. Choose round or loin cuts of red meat. These types of cuts are lowest in fat and cholesterol. Chicken (without the skin), fish, veal, and ground [place] breast are great choices. Eliminate fatty meats, such as hot dogs and salami. Even shellfish have little or no saturated fat. Have a 3 oz (85 g) portion when you eat lean meat, poultry, or fish. Trans fats are also called "partially  hydrogenated oils." They are oils that have been scientifically manipulated so that they are solid at room temperature resulting in a longer shelf life and improved taste and texture of foods in which they are added. Trans fats are found in stick margarine, some tub margarines, cookies, crackers, and baked goods.  When baking and cooking, oils are a great substitute for butter. The monounsaturated oils are especially beneficial since it is believed they lower LDL and raise HDL. The oils you should avoid entirely are saturated tropical oils, such as coconut and palm.  Remember to eat a lot from food groups that are naturally free of saturated and trans fat, including fish, fruit, vegetables, beans, grains (barley, rice, couscous, bulgur wheat), and pasta (without cream sauces).  IDENTIFYING FOODS THAT LOWER FAT AND CHOLESTEROL  Soluble fiber may lower your cholesterol. This type of fiber is found in fruits such as apples, vegetables such as broccoli, potatoes, and carrots, legumes such as beans, peas, and lentils, and grains such as barley. Foods fortified with plant sterols (phytosterol) may also lower cholesterol. You should eat at least 2 g per day of these foods for a cholesterol lowering effect.  Read package labels to identify low-saturated fats, trans fat free, and low-fat foods at the supermarket. Select cheeses that have only 2 to 3 g saturated fat per ounce. Use a heart-healthy tub margarine that is free of trans fats or partially hydrogenated oil. When buying baked goods (cookies, crackers), avoid partially hydrogenated oils. Breads and muffins should be made from whole grains (  whole-wheat or whole oat flour, instead of "flour" or "enriched flour"). Buy non-creamy canned soups with reduced salt and no added fats.  FOOD PREPARATION TECHNIQUES  Never deep-fry. If you must fry, either stir-fry, which uses very little fat, or use non-stick cooking sprays. When possible, broil, bake, or roast meats, and  steam vegetables. Instead of putting butter or margarine on vegetables, use lemon and herbs, applesauce, and cinnamon (for squash and sweet potatoes). Use nonfat yogurt, salsa, and low-fat dressings for salads.  LOW-SATURATED FAT / LOW-FAT FOOD SUBSTITUTES Meats / Saturated Fat (g)  Avoid: Steak, marbled (3 oz/85 g) / 11 g  Choose: Steak, lean (3 oz/85 g) / 4 g  Avoid: Hamburger (3 oz/85 g) / 7 g  Choose: Hamburger, lean (3 oz/85 g) / 5 g  Avoid: Ham (3 oz/85 g) / 6 g  Choose: Ham, lean cut (3 oz/85 g) / 2.4 g  Avoid: Chicken, with skin, dark meat (3 oz/85 g) / 4 g  Choose: Chicken, skin removed, dark meat (3 oz/85 g) / 2 g  Avoid: Chicken, with skin, light meat (3 oz/85 g) / 2.5 g  Choose: Chicken, skin removed, light meat (3 oz/85 g) / 1 g Dairy / Saturated Fat (g)  Avoid: Whole milk (1 cup) / 5 g  Choose: Low-fat milk, 2% (1 cup) / 3 g  Choose: Low-fat milk, 1% (1 cup) / 1.5 g  Choose: Skim milk (1 cup) / 0.3 g  Avoid: Hard cheese (1 oz/28 g) / 6 g  Choose: Skim milk cheese (1 oz/28 g) / 2 to 3 g  Avoid: Cottage cheese, 4% fat (1 cup) / 6.5 g  Choose: Low-fat cottage cheese, 1% fat (1 cup) / 1.5 g  Avoid: Ice cream (1 cup) / 9 g  Choose: Sherbet (1 cup) / 2.5 g  Choose: Nonfat frozen yogurt (1 cup) / 0.3 g  Choose: Frozen fruit bar / trace  Avoid: Whipped cream (1 tbs) / 3.5 g  Choose: Nondairy whipped topping (1 tbs) / 1 g Condiments / Saturated Fat (g)  Avoid: Mayonnaise (1 tbs) / 2 g  Choose: Low-fat mayonnaise (1 tbs) / 1 g  Avoid: Butter (1 tbs) / 7 g  Choose: Extra light margarine (1 tbs) / 1 g  Avoid: Coconut oil (1 tbs) / 11.8 g  Choose: Olive oil (1 tbs) / 1.8 g  Choose: Corn oil (1 tbs) / 1.7 g  Choose: Safflower oil (1 tbs) / 1.2 g  Choose: Sunflower oil (1 tbs) / 1.4 g  Choose: Soybean oil (1 tbs) / 2.4 g  Choose: Canola oil (1 tbs) / 1 g Document Released: 10/28/2005 Document Revised: 02/22/2013 Document Reviewed:  04/18/2011 Exit[employer] Patient Information 2014 [place], [place].

## 2014-03-01 NOTE — Progress Notes (Signed)
Pre visit review using our clinic review tool, if applicable. No additional management support is needed unless otherwise documented below in the visit note. 

## 2014-03-01 NOTE — Progress Notes (Deleted)
Pre visit review using our clinic review tool, if applicable. No additional management support is needed unless otherwise documented below in the visit note. 

## 2014-03-01 NOTE — Progress Notes (Signed)
Subjective:    Patient ID: Kathryn Weaver, female    DOB: 1946/05/02, 68 y.o.   MRN: [id number]  HPI I have personally reviewed the Medicare Annual Wellness questionnaire and have noted 1. The patient's medical and social history 2. Their use of alcohol, tobacco or illicit drugs 3. Their current medications and supplements 4. The patient's functional ability including ADL's, fall risks, home safety risks and hearing or visual             impairment. 5. Diet and physical activities 6. Evidence for depression or mood disorders  The patients weight, height, BMI have been recorded in the chart and visual acuity is per eye clinic.  I have made referrals, counseling and provided education to the patient based review of the above and I have provided the pt with a written personalized care plan for preventive services.  She is really tired lately -not motivated and she wants to sleep all the time  She thinks it is from her hematology problems- she goes back in 6 months  Given iron IV infusion  She says she it not depressed  Some stressors - her children fight all the time   See scanned forms.  Routine anticipatory guidance given to patient.  See health maintenance. Colon cancer screening colonosc 8/13- 5 year recall  Breast cancer screening 2/15 mammogram Self breast exam- no lumps or changes  No gyn problems at all , she does want to check for a cystocele - can feel it , but no incontinence issues, she still has all her parts  Flu vaccine -did not get one  Tetanus vaccine 9/09 Pneumovax - will have today Zoster vaccine declines   Advance directive-she has a living will  Cognitive function addressed- see scanned forms- and if abnormal then additional documentation follows. - memory is overall very good   PMH and SH reviewed  Meds, vitals, and allergies reviewed.   ROS: See HPI.  Otherwise negative.       Lab Results  Component Value Date   WBC 3.1* 02/21/2014   HGB 13.0  02/21/2014   HCT 39.0 02/21/2014   MCV 96.3 02/21/2014   PLT 93.0* 02/21/2014    bp is stable today  No cp or palpitations or headaches or edema  No side effects to medicines  BP Readings from Last 3 Encounters:  03/01/14 126/68  01/25/14 126/58  01/10/14 112/68     Lab Results  Component Value Date   CHOL 238* 02/21/2014   CHOL 234* 04/21/2012   CHOL 226* 10/22/2010   Lab Results  Component Value Date   HDL 26.40* 02/21/2014   HDL 21.10* 04/21/2012   HDL 61.50 10/22/2010   Lab Results  Component Value Date   LDLCALC 199* 02/21/2014   Lab Results  Component Value Date   TRIG 65.0 02/21/2014   TRIG 86.0 04/21/2012   TRIG 45.0 10/22/2010   Lab Results  Component Value Date   CHOLHDL 9 02/21/2014   CHOLHDL 11 04/21/2012   CHOLHDL 4 10/22/2010   Lab Results  Component Value Date   LDLDIRECT 156.0 04/21/2012   LDLDIRECT 138.9 10/22/2010   LDLDIRECT 163.0 10/20/2009   LDL cholesterol is way up  No fried food or red meat and no grease or butter , she does eat ice cream   Otherwise labs look pretty stable   Patient Active Problem List   Diagnosis Date Noted  . Encounter for Medicare annual wellness exam 03/01/2014  . Uterine prolapse 03/01/2014  .  Esophageal dysphagia 01/05/2013  . Stress reaction 09/01/2012  . Intolerance to fatty food 08/11/2012  . Abdominal pain 08/07/2012  . Pain of right leg 08/07/2012  . Numbness of toes 08/07/2012  . Hyperlipidemia 04/28/2012  . Routine general medical examination at a health care facility 04/20/2012  . Obesity 02/05/2012  . Chest pain 08/02/2011  . LEUKOCYTOPENIA UNSPECIFIED 10/23/2010  . THROMBOCYTOPENIA 10/24/2009  . TRANSAMINASES, SERUM, ELEVATED 10/24/2009  . POSTMENOPAUSAL STATUS 08/09/2008  . DEPRESSION 08/05/2007  . HYPERTENSION 08/05/2007  . GERD 04/20/2007  . DIVERTICULAR DISEASE 04/20/2007  . HX, PERSONAL, COLONIC POLYPS 04/13/2007   Past Medical History  Diagnosis Date  . Hypertension   . Anemia     s/p GI  bleed, hospital 5/08  . Adenomatous polyp of colon 03/2007  . GERD (gastroesophageal reflux disease)   . Hiatal hernia     EGD 5/08- stricture, HH, gastritis, GERD  . Depression   . Hypertension   . Endometriosis     uterine US 06/2003  . Diverticulosis    Past Surgical History  Procedure Laterality Date  . Tubal ligation    . Cardiac ct  9/12    normal    History  Substance Use Topics  . Smoking status: Never Smoker   . Smokeless tobacco: Never Used  . Alcohol Use: No   Family History  Problem Relation Age of Onset  . Hypertension Mother   . Cancer Father     unknown type  . Colon cancer Neg Hx   . Esophageal cancer Neg Hx   . Rectal cancer Neg Hx   . Stomach cancer Neg Hx    No Known Allergies Current Outpatient Prescriptions on File Prior to Visit  Medication Sig Dispense Refill  . Esomeprazole Magnesium (NEXIUM 24HR PO) Take 22.3 mg by mouth daily.      . Multiple Vitamins-Minerals (CENTRUM PO) Take one by mouth daily       . Naproxen Sodium (ALEVE PO) Take 1 tablet by mouth as needed.      . [DISCONTINUED] ranitidine (ZANTAC) 150 MG capsule Take 1 capsule (150 mg total) by mouth 2 (two) times daily. For 3 months  60 capsule  2   No current facility-administered medications on file prior to visit.     Review of Systems Review of Systems  Constitutional: Negative for fever, appetite change, fatigue and unexpected weight change.  Eyes: Negative for pain and visual disturbance.  Respiratory: Negative for cough and shortness of breath.   Cardiovascular: Negative for cp or palpitations    Gastrointestinal: Negative for nausea, diarrhea and constipation.  Genitourinary: Negative for urgency and frequency. pos for protuberance from vagina when standing Skin: Negative for pallor or rash   Neurological: Negative for weakness, light-headedness, numbness and headaches.  Hematological: Negative for adenopathy. Does not bruise/bleed easily.  Psychiatric/Behavioral:  Negative for dysphoric mood. The patient is not nervous/anxious.         Objective:   Physical Exam  Constitutional: She appears well-developed and well-nourished. No distress.  HENT:  Head: Normocephalic and atraumatic.  Right Ear: External ear normal.  Left Ear: External ear normal.  Mouth/Throat: Oropharynx is clear and moist.  Eyes: Conjunctivae and EOM are normal. Pupils are equal, round, and reactive to light. No scleral icterus.  Neck: Normal range of motion. Neck supple. No JVD present. Carotid bruit is not present. No thyromegaly present.  Cardiovascular: Normal rate, regular rhythm, normal heart sounds and intact distal pulses.  Exam reveals no gallop.  Pulmonary/Chest: Effort normal and breath sounds normal. No respiratory distress. She has no wheezes. She exhibits no tenderness.  Abdominal: Soft. Bowel sounds are normal. She exhibits no distension, no abdominal bruit and no mass. There is no tenderness.  Genitourinary: No breast swelling, tenderness, discharge or bleeding. There is no rash, tenderness or lesion on the right labia. There is no rash, tenderness or lesion on the left labia. Cervix exhibits no motion tenderness, no discharge and no friability. No erythema, tenderness or bleeding around the vagina. No foreign body around the vagina. No signs of injury around the vagina. No vaginal discharge found.  Breast exam: No mass, nodules, thickening, tenderness, bulging, retraction, inflamation, nipple discharge or skin changes noted.  No axillary or clavicular LA.    Notable uterine / cervix prolapse through vagina It is reducible No discomfort     Musculoskeletal: Normal range of motion. She exhibits no edema and no tenderness.  Lymphadenopathy:    She has no cervical adenopathy.  Neurological: She is alert. She has normal reflexes. No cranial nerve deficit. She exhibits normal muscle tone. Coordination normal.  Skin: Skin is warm and dry. No rash noted. No erythema. No  pallor.  Psychiatric: She has a normal mood and affect.          Assessment & Plan:

## 2014-03-01 NOTE — Assessment & Plan Note (Signed)
bp in fair control at this time  BP Readings from Last 1 Encounters:  03/01/14 126/68   No changes needed Disc lifstyle change with low sodium diet and exercise  Labs reviewed

## 2014-03-02 ENCOUNTER — Encounter: Payer: Medicare HMO | Admitting: Family Medicine

## 2014-03-02 ENCOUNTER — Telehealth: Payer: Self-pay | Admitting: Family Medicine

## 2014-03-02 NOTE — Telephone Encounter (Signed)
Relevant patient education mailed to patient.  

## 2014-03-03 ENCOUNTER — Encounter: Payer: Medicare PPO | Admitting: Obstetrics & Gynecology

## 2014-03-04 ENCOUNTER — Ambulatory Visit (INDEPENDENT_AMBULATORY_CARE_PROVIDER_SITE_OTHER): Payer: Medicare PPO | Admitting: Obstetrics & Gynecology

## 2014-03-04 DIAGNOSIS — N814 Uterovaginal prolapse, unspecified: Secondary | ICD-10-CM

## 2014-03-04 DIAGNOSIS — N813 Complete uterovaginal prolapse: Secondary | ICD-10-CM

## 2014-03-04 NOTE — Progress Notes (Signed)
   Subjective:    Patient ID: Kathryn Weaver, female    DOB: [birth year], 68 y.o.   MRN: [id number]  HPI  This lovely 68 yo M AA lady is here today because of asymptomatic complete procentia. She is still "unfortunately" sexually active.  Review of Systems     Objective:   Physical Exam Complete procentia I placed a 2 1/2 inch ring pessary. The uterus did not fall back down. She was initially unable to remove the pessary, but she feels quite certain that she will be able to do this at home.        Assessment & Plan:  Complete procentia- Pessary If she is unable to remove and replace the pessary, then she will come here monthly for that. If she is able to remove it, then she will do so 1-2 times per week. RTC 1 month

## 2014-03-07 ENCOUNTER — Encounter: Payer: Self-pay | Admitting: Obstetrics & Gynecology

## 2014-03-16 ENCOUNTER — Encounter (HOSPITAL_COMMUNITY): Payer: Medicare PPO | Attending: Oncology | Admitting: Oncology

## 2014-03-16 ENCOUNTER — Encounter (HOSPITAL_COMMUNITY): Payer: Self-pay | Admitting: Oncology

## 2014-03-16 DIAGNOSIS — D509 Iron deficiency anemia, unspecified: Secondary | ICD-10-CM | POA: Insufficient documentation

## 2014-03-16 DIAGNOSIS — R531 Weakness: Secondary | ICD-10-CM

## 2014-03-16 DIAGNOSIS — R5383 Other fatigue: Secondary | ICD-10-CM

## 2014-03-16 DIAGNOSIS — E611 Iron deficiency: Secondary | ICD-10-CM

## 2014-03-16 DIAGNOSIS — Z862 Personal history of diseases of the blood and blood-forming organs and certain disorders involving the immune mechanism: Secondary | ICD-10-CM

## 2014-03-16 DIAGNOSIS — R5381 Other malaise: Secondary | ICD-10-CM

## 2014-03-16 HISTORY — DX: Iron deficiency: E61.1

## 2014-03-16 LAB — COMPREHENSIVE METABOLIC PANEL
ALT: 62 U/L — ABNORMAL HIGH (ref 0–35)
AST: 39 U/L — AB (ref 0–37)
Albumin: 3.5 g/dL (ref 3.5–5.2)
Alkaline Phosphatase: 98 U/L (ref 39–117)
BILIRUBIN TOTAL: 0.3 mg/dL (ref 0.3–1.2)
BUN: 13 mg/dL (ref 6–23)
CALCIUM: 10.8 mg/dL — AB (ref 8.4–10.5)
CHLORIDE: 102 meq/L (ref 96–112)
CO2: 32 mEq/L (ref 19–32)
CREATININE: 1.08 mg/dL (ref 0.50–1.10)
GFR calc Af Amer: 61 mL/min — ABNORMAL LOW (ref >90)
GFR calc non Af Amer: 52 mL/min — ABNORMAL LOW (ref >90)
Glucose, Bld: 99 mg/dL (ref 70–99)
Potassium: 4.2 mEq/L (ref 3.7–5.3)
Sodium: 144 mEq/L (ref 137–147)
Total Protein: 8 g/dL (ref 6.0–8.3)

## 2014-03-16 LAB — CBC WITH DIFFERENTIAL/PLATELET
BASOS ABS: 0 10*3/uL (ref 0.0–0.1)
BASOS PCT: 0 % (ref 0–1)
Eosinophils Absolute: 0 10*3/uL (ref 0.0–0.7)
Eosinophils Relative: 1 % (ref 0–5)
HEMATOCRIT: 40.9 % (ref 36.0–46.0)
Hemoglobin: 13.9 g/dL (ref 12.0–15.0)
Lymphocytes Relative: 41 % (ref 12–46)
Lymphs Abs: 1.4 10*3/uL (ref 0.7–4.0)
MCH: 33.1 pg (ref 26.0–34.0)
MCHC: 34 g/dL (ref 30.0–36.0)
MCV: 97.4 fL (ref 78.0–100.0)
MONO ABS: 0.3 10*3/uL (ref 0.1–1.0)
Monocytes Relative: 8 % (ref 3–12)
NEUTROS ABS: 1.6 10*3/uL — AB (ref 1.7–7.7)
Neutrophils Relative %: 50 % (ref 43–77)
Platelets: 112 10*3/uL — ABNORMAL LOW (ref 150–400)
RBC: 4.2 MIL/uL (ref 3.87–5.11)
RDW: 13.1 % (ref 11.5–15.5)
WBC: 3.3 10*3/uL — ABNORMAL LOW (ref 4.0–10.5)

## 2014-03-16 LAB — IRON AND TIBC
Iron: 102 ug/dL (ref 42–135)
SATURATION RATIOS: 33 % (ref 20–55)
TIBC: 308 ug/dL (ref 250–470)
UIBC: 206 ug/dL (ref 125–400)

## 2014-03-16 LAB — FERRITIN: FERRITIN: 234 ng/mL (ref 10–291)

## 2014-03-16 NOTE — Progress Notes (Signed)
Early Chars presented for labwork. Labs per MD order drawn via Peripheral Line 23 gauge needle inserted in right AC  Good blood return present. Procedure without incident.  Needle removed intact. Patient tolerated procedure well.

## 2014-03-16 NOTE — Progress Notes (Signed)
The patient is seen today as a work-in for tiredness and weakness.  She has a history of benign ethnic leukopenia, with a negative work-up for thrombocytopenia and this is stable.  She also has a history of iron deficiency.  Vital: BP 118/71, T 97.5 F, O2 97%, P 70.  She reports that she get tired in the early afternoon.  This is her chief complaint.  She reports that she feels fine when she gets up but as the day progresses, she needs to take a 1 hour nap.  This has been ongoing x 3 weeks.  She recently saw her PCP and got a good report from her.  She denies any fevers, chills, night sweats, cough, sputum production, sore throat, chest pain, abdominal pain, blood in stool, black tarry stool, urinary complaints, etc.  Negative ROS.  Chart is reviewed.   I personally reviewed and went over laboratory results with the patient.  The results are noted within this dictation.  Physical exam: Gen: NAD.  Pleasant, smiling HEENT: Atraumtic Oropharynx: moist without any abnormalities Neck: Trachea midline, supple, no lymphadenopathy Cardiac: RRR without murmur Lungs: CTA B/L Skin: Warm and dry Extremities: WNL Neuro: A and O x 3  I will order labs today: CBC diff, CMET, iron/TIBC, Ferritin, TSH and see what her iron stores are.  She reports that she cries at night, but then recants this statement.  She reports that her spirits are high and she does not find herself crying.  She is busy with her grand-daughter whom she watches for her daughter.    Depending on lab results, I will order IV Feraheme.  If labs are negative, she is to follow-up with PCP.  Patient and plan discussed with Dr. Farrel Gobble and he is in agreement with the aforementioned.   [facility] 03/16/2014

## 2014-03-16 NOTE — Patient Instructions (Signed)
[facility] Discharge Instructions  RECOMMENDATIONS MADE BY THE CONSULTANT AND ANY TEST RESULTS WILL BE SENT TO YOUR REFERRING PHYSICIAN.  EXAM FINDINGS BY THE PHYSICIAN TODAY AND SIGNS OR SYMPTOMS TO REPORT TO CLINIC OR PRIMARY PHYSICIAN: Exam and findings as discussed by Robynn Pane, PA- C.  We will check some blood work and if anything needs to be done we will contact you.  MEDICATIONS PRESCRIBED:  none  INSTRUCTIONS/FOLLOW-UP: As scheduled.  Thank you for choosing [facility] to provide your oncology and hematology care.  To afford each patient quality time with our providers, please arrive at least 15 minutes before your scheduled appointment time.  With your help, our goal is to use those 15 minutes to complete the necessary work-up to ensure our physicians have the information they need to help with your evaluation and healthcare recommendations.    Effective January 1st, 2014, we ask that you re-schedule your appointment with our physicians should you arrive 10 or more minutes late for your appointment.  We strive to give you quality time with our providers, and arriving late affects you and other patients whose appointments are after yours.    Again, thank you for choosing [facility].  Our hope is that these requests will decrease the amount of time that you wait before being seen by our physicians.       _____________________________________________________________  Should you have questions after your visit to [facility], please contact our office at [phone] between the hours of 8:30 a.m. and 5:00 p.m.  Voicemails left after 4:30 p.m. will not be returned until the following business day.  For prescription refill requests, have your pharmacy contact our office with your prescription refill request.

## 2014-03-17 LAB — TSH: TSH: 2.64 u[IU]/mL (ref 0.350–4.500)

## 2014-03-28 ENCOUNTER — Encounter: Payer: Self-pay | Admitting: Obstetrics & Gynecology

## 2014-03-28 ENCOUNTER — Ambulatory Visit (INDEPENDENT_AMBULATORY_CARE_PROVIDER_SITE_OTHER): Payer: Medicare PPO | Admitting: Obstetrics & Gynecology

## 2014-03-28 VITALS — BP 114/70 | HR 70 | Ht 63.0 in | Wt 177.4 lb

## 2014-03-28 DIAGNOSIS — N813 Complete uterovaginal prolapse: Secondary | ICD-10-CM

## 2014-03-28 DIAGNOSIS — N814 Uterovaginal prolapse, unspecified: Secondary | ICD-10-CM

## 2014-03-28 NOTE — Progress Notes (Signed)
   Subjective:    Patient ID: Kathryn Weaver, female    DOB: December 22, 1945, 68 y.o.   MRN: [id number]  HPI She is here for a pessary check. She went home after her last visit and figured out how to remove and insert the pessary. She is quite happy with it. It does come out spontaneously at times with a bowel movement.   Review of Systems     Objective:   Physical Exam  Normal vaginal mucosa with no excoriations      Assessment & Plan:  Complete procedentia- doing well with pessary. RTC 1 year/prn sooner

## 2014-03-28 NOTE — Progress Notes (Signed)
Pessary check, patient is doing well with it.

## 2014-04-20 ENCOUNTER — Encounter (INDEPENDENT_AMBULATORY_CARE_PROVIDER_SITE_OTHER): Payer: Self-pay

## 2014-04-20 ENCOUNTER — Encounter: Payer: Self-pay | Admitting: Family Medicine

## 2014-04-20 ENCOUNTER — Ambulatory Visit (INDEPENDENT_AMBULATORY_CARE_PROVIDER_SITE_OTHER): Payer: Medicare PPO | Admitting: Family Medicine

## 2014-04-20 VITALS — BP 122/70 | HR 54 | Temp 98.1°F | Ht 63.0 in | Wt 179.2 lb

## 2014-04-20 DIAGNOSIS — K648 Other hemorrhoids: Secondary | ICD-10-CM | POA: Insufficient documentation

## 2014-04-20 MED ORDER — HYDROCORTISONE ACETATE 25 MG RE SUPP: 25.0000 mg | Freq: Every day | RECTAL | Status: DC

## 2014-04-20 NOTE — Progress Notes (Signed)
Pre visit review using our clinic review tool, if applicable. No additional management support is needed unless otherwise documented below in the visit note. 

## 2014-04-20 NOTE — Patient Instructions (Signed)
I think your rectal bleeding is from an internal hemorrhoid  Use the suppositories at bedtime for 12 days Update if not starting to improve in a week or if worsening  Keep stools soft so you do not have to strain  Avoid heavy lifting for a while

## 2014-04-20 NOTE — Assessment & Plan Note (Signed)
At 5:00 on anoscopy- bleeding in pt with thrombocytopenia Px anusol hc suppos to use for 12 d Update if not starting to improve in a week or if worsening   Disc lifestyle change- wt loss/ keep stools soft/avoid straining Handout given

## 2014-04-20 NOTE — Progress Notes (Signed)
Subjective:    Patient ID: Kathryn Weaver, female    DOB: [birth year], 68 y.o.   MRN: [id number]  HPI Here for c/o blood in stool since Monday On Monday- saw just a streak  Tuesday as well  Nothing she can see or feel - just a little itching  No rectal pain  No abd pain   No prior hx of hemorrhoids Does have to strain for bm occas   No change in diet - did not eat anything that disagreed with her   Feeling fine -walking every day   Hx of thrombocytopenia  Followed by hematology Lab Results  Component Value Date   WBC 3.3* 03/16/2014   HGB 13.9 03/16/2014   HCT 40.9 03/16/2014   MCV 97.4 03/16/2014   PLT 112* 03/16/2014    colonosc 8/13 Cecal polyp removed  Recall 5 y  ? Hx of diverticulosis   Patient Active Problem List   Diagnosis Date Noted  . Iron deficiency 03/16/2014  . Encounter for Medicare annual wellness exam 03/01/2014  . Uterine prolapse 03/01/2014  . Esophageal dysphagia 01/05/2013  . Stress reaction 09/01/2012  . Intolerance to fatty food 08/11/2012  . Abdominal pain 08/07/2012  . Pain of right leg 08/07/2012  . Numbness of toes 08/07/2012  . Hyperlipidemia 04/28/2012  . Routine general medical examination at a health care facility 04/20/2012  . Obesity 02/05/2012  . Chest pain 08/02/2011  . LEUKOCYTOPENIA UNSPECIFIED 10/23/2010  . THROMBOCYTOPENIA 10/24/2009  . TRANSAMINASES, SERUM, ELEVATED 10/24/2009  . POSTMENOPAUSAL STATUS 08/09/2008  . DEPRESSION 08/05/2007  . HYPERTENSION 08/05/2007  . GERD 04/20/2007  . DIVERTICULAR DISEASE 04/20/2007  . HX, PERSONAL, COLONIC POLYPS 04/13/2007   Past Medical History  Diagnosis Date  . Hypertension   . Anemia     s/p GI bleed, hospital 5/08  . Adenomatous polyp of colon 03/2007  . GERD (gastroesophageal reflux disease)   . Hiatal hernia     EGD 5/08- stricture, HH, gastritis, GERD  . Depression   . Hypertension   . Endometriosis     uterine US 06/2003  . Diverticulosis   . Iron deficiency 03/16/2014    Past Surgical History  Procedure Laterality Date  . Tubal ligation    . Cardiac ct  9/12    normal    History  Substance Use Topics  . Smoking status: Never Smoker   . Smokeless tobacco: Never Used  . Alcohol Use: No   Family History  Problem Relation Age of Onset  . Hypertension Mother   . Cancer Father     unknown type  . Colon cancer Neg Hx   . Esophageal cancer Neg Hx   . Rectal cancer Neg Hx   . Stomach cancer Neg Hx    No Known Allergies Current Outpatient Prescriptions on File Prior to Visit  Medication Sig Dispense Refill  . Esomeprazole Magnesium (NEXIUM 24HR PO) Take 22.3 mg by mouth daily.      . hydrochlorothiazide (HYDRODIURIL) 25 MG tablet Take 1 tablet (25 mg total) by mouth daily.  90 tablet  3  . Multiple Vitamins-Minerals (CENTRUM PO) Take one by mouth daily       . Naproxen Sodium (ALEVE PO) Take 1 tablet by mouth as needed.      . sertraline (ZOLOFT) 50 MG tablet TAKE 1 TABLET EVERY DAY  90 tablet  3  . [DISCONTINUED] ranitidine (ZANTAC) 150 MG capsule Take 1 capsule (150 mg total) by mouth 2 (two) times daily. For  3 months  60 capsule  2   No current facility-administered medications on file prior to visit.    Review of Systems Review of Systems  Constitutional: Negative for fever, appetite change, fatigue and unexpected weight change.  Eyes: Negative for pain and visual disturbance.  Respiratory: Negative for cough and shortness of breath.   Cardiovascular: Negative for cp or palpitations    Gastrointestinal: Negative for nausea, diarrhea and constipation. pos for streak of blood in stool, neg for dark stool, neg for abd pain  Genitourinary: Negative for urgency and frequency.  Skin: Negative for pallor or rash   Neurological: Negative for weakness, light-headedness, numbness and headaches.  Hematological: Negative for adenopathy. Does not bruise/bleed easily. pos for hx of low platelet  Psychiatric/Behavioral: Negative for dysphoric mood. The  patient is not nervous/anxious.         Objective:   Physical Exam  Constitutional: She appears well-developed and well-nourished. No distress.  obese and well appearing   HENT:  Head: Normocephalic and atraumatic.  Mouth/Throat: Oropharynx is clear and moist.  Eyes: Conjunctivae and EOM are normal. Pupils are equal, round, and reactive to light.  Cardiovascular: Normal rate and regular rhythm.   Abdominal: Soft. Bowel sounds are normal. She exhibits no distension and no mass. There is no tenderness.  Genitourinary: Rectal exam shows internal hemorrhoid. Rectal exam shows no external hemorrhoid, no fissure, no mass, no tenderness and anal tone normal. Guaiac positive stool.  Internal non thrombosed hemorrhoid at 5:00 Non tender  Nl rectal tone   Neurological: She is alert.  Skin: Skin is warm and dry. No rash noted. No erythema.  Psychiatric: She has a normal mood and affect.          Assessment & Plan:   Problem List Items Addressed This Visit     Cardiovascular and Mediastinum   Internal hemorrhoid - Primary     At 5:00 on anoscopy- bleeding in pt with thrombocytopenia Px anusol hc suppos to use for 12 d Update if not starting to improve in a week or if worsening   Disc lifestyle change- wt loss/ keep stools soft/avoid straining Handout given

## 2014-05-27 ENCOUNTER — Other Ambulatory Visit (INDEPENDENT_AMBULATORY_CARE_PROVIDER_SITE_OTHER): Payer: Medicare PPO

## 2014-05-27 DIAGNOSIS — E785 Hyperlipidemia, unspecified: Secondary | ICD-10-CM

## 2014-05-27 LAB — LIPID PANEL
Cholesterol: 215 mg/dL — ABNORMAL HIGH (ref 0–200)
HDL: 20.5 mg/dL — ABNORMAL LOW (ref >39.00)
LDL Cholesterol: 181 mg/dL — ABNORMAL HIGH (ref 0–99)
NONHDL: 194.5
Total CHOL/HDL Ratio: 10
Triglycerides: 66 mg/dL (ref 0.0–149.0)
VLDL: 13.2 mg/dL (ref 0.0–40.0)

## 2014-05-27 LAB — AST: AST: 28 U/L (ref 0–37)

## 2014-05-27 LAB — ALT: ALT: 29 U/L (ref 0–35)

## 2014-05-31 ENCOUNTER — Ambulatory Visit (INDEPENDENT_AMBULATORY_CARE_PROVIDER_SITE_OTHER): Payer: Medicare PPO | Admitting: Family Medicine

## 2014-05-31 ENCOUNTER — Encounter: Payer: Self-pay | Admitting: Family Medicine

## 2014-05-31 VITALS — BP 104/68 | HR 52 | Temp 98.3°F | Ht 63.0 in | Wt 179.5 lb

## 2014-05-31 DIAGNOSIS — E785 Hyperlipidemia, unspecified: Secondary | ICD-10-CM

## 2014-05-31 DIAGNOSIS — I10 Essential (primary) hypertension: Secondary | ICD-10-CM

## 2014-05-31 DIAGNOSIS — K130 Diseases of lips: Secondary | ICD-10-CM

## 2014-05-31 MED ORDER — ATORVASTATIN CALCIUM 10 MG PO TABS: 10.0000 mg | ORAL_TABLET | Freq: Every day | ORAL | Status: DC

## 2014-05-31 NOTE — Progress Notes (Signed)
Subjective:    Patient ID: Kathryn Weaver, female    DOB: 22-Sep-1946, 68 y.o.   MRN: [id number]  HPI Here for f/u of chronic medical problems and also a lip problem  Her lips feel very chapped / or ? Sunburned  She was out in the sun  Did not use spf on her lips Going on about a week- and improving  Now peeling  No new lip products  Using blistex and vaseline  She is trying to eat better - but was on vacation   Hyperlipidemia Lab Results  Component Value Date   CHOL 215* 05/27/2014   CHOL 238* 02/21/2014   CHOL 234* 04/21/2012   Lab Results  Component Value Date   HDL 20.50* 05/27/2014   HDL 26.40* 02/21/2014   HDL 21.10* 04/21/2012   Lab Results  Component Value Date   LDLCALC 181* 05/27/2014   LDLCALC 199* 02/21/2014   Lab Results  Component Value Date   TRIG 66.0 05/27/2014   TRIG 65.0 02/21/2014   TRIG 86.0 04/21/2012   Lab Results  Component Value Date   CHOLHDL 10 05/27/2014   CHOLHDL 9 02/21/2014   CHOLHDL 11 04/21/2012   Lab Results  Component Value Date   LDLDIRECT 156.0 04/21/2012   LDLDIRECT 138.9 10/22/2010   LDLDIRECT 163.0 10/20/2009    She is not exercising now (was walking 2 mi per day until it got too high) [facility]  LDL - is improved but still very high   Does not eat fried foods or red meat (except on vacation) Not a lot of high fat dairy No shellfish   Sister has high chol  Also mother   Lab Results  Component Value Date   ALT 29 05/27/2014   AST 28 05/27/2014   ALKPHOS 98 03/16/2014   BILITOT 0.3 03/16/2014    Patient Active Problem List   Diagnosis Date Noted  . Internal hemorrhoid 04/20/2014  . Iron deficiency 03/16/2014  . Encounter for Medicare annual wellness exam 03/01/2014  . Uterine prolapse 03/01/2014  . Esophageal dysphagia 01/05/2013  . Stress reaction 09/01/2012  . Intolerance to fatty food 08/11/2012  . Abdominal pain 08/07/2012  . Pain of right leg 08/07/2012  . Numbness of toes 08/07/2012  .  Hyperlipidemia 04/28/2012  . Routine general medical examination at a health care facility 04/20/2012  . Obesity 02/05/2012  . Chest pain 08/02/2011  . LEUKOCYTOPENIA UNSPECIFIED 10/23/2010  . THROMBOCYTOPENIA 10/24/2009  . TRANSAMINASES, SERUM, ELEVATED 10/24/2009  . POSTMENOPAUSAL STATUS 08/09/2008  . DEPRESSION 08/05/2007  . HYPERTENSION 08/05/2007  . GERD 04/20/2007  . DIVERTICULAR DISEASE 04/20/2007  . HX, PERSONAL, COLONIC POLYPS 04/13/2007   Past Medical History  Diagnosis Date  . Hypertension   . Anemia     s/p GI bleed, hospital 5/08  . Adenomatous polyp of colon 03/2007  . GERD (gastroesophageal reflux disease)   . Hiatal hernia     EGD 5/08- stricture, HH, gastritis, GERD  . Depression   . Hypertension   . Endometriosis     uterine US 06/2003  . Diverticulosis   . Iron deficiency 03/16/2014   Past Surgical History  Procedure Laterality Date  . Tubal ligation    . Cardiac ct  9/12    normal    History  Substance Use Topics  . Smoking status: Never Smoker   . Smokeless tobacco: Never Used  . Alcohol Use: No   Family History  Problem Relation Age of  Onset  . Hypertension Mother   . Cancer Father     unknown type  . Colon cancer Neg Hx   . Esophageal cancer Neg Hx   . Rectal cancer Neg Hx   . Stomach cancer Neg Hx    No Known Allergies Current Outpatient Prescriptions on File Prior to Visit  Medication Sig Dispense Refill  . Esomeprazole Magnesium (NEXIUM 24HR PO) Take 22.3 mg by mouth daily.      . hydrochlorothiazide (HYDRODIURIL) 25 MG tablet Take 1 tablet (25 mg total) by mouth daily.  90 tablet  3  . hydrocortisone (ANUSOL-HC) 25 MG suppository Place 1 suppository (25 mg total) rectally at bedtime.  12 suppository  1  . Multiple Vitamins-Minerals (CENTRUM PO) Take one by mouth daily       . Naproxen Sodium (ALEVE PO) Take 1 tablet by mouth as needed.      . sertraline (ZOLOFT) 50 MG tablet TAKE 1 TABLET EVERY DAY  90 tablet  3  . [DISCONTINUED]  ranitidine (ZANTAC) 150 MG capsule Take 1 capsule (150 mg total) by mouth 2 (two) times daily. For 3 months  60 capsule  2   No current facility-administered medications on file prior to visit.     Review of Systems Review of Systems  Constitutional: Negative for fever, appetite change, fatigue and unexpected weight change.  Eyes: Negative for pain and visual disturbance.  Respiratory: Negative for cough and shortness of breath.   Cardiovascular: Negative for cp or palpitations    Gastrointestinal: Negative for nausea, diarrhea and constipation.  Genitourinary: Negative for urgency and frequency.  Skin: Negative for pallor or rash  Pos for lip sensitivity and peeling   Neurological: Negative for weakness, light-headedness, numbness and headaches.  Hematological: Negative for adenopathy. Does not bruise/bleed easily.  Psychiatric/Behavioral: Negative for dysphoric mood. The patient is not nervous/anxious.         Objective:   Physical Exam  Constitutional: She appears well-developed and well-nourished. No distress.  obese and well appearing   HENT:  Head: Normocephalic and atraumatic.  Mouth/Throat: Oropharynx is clear and moist.  Eyes: Conjunctivae and EOM are normal. Pupils are equal, round, and reactive to light. Right eye exhibits no discharge. Left eye exhibits no discharge. No scleral icterus.  Neck: Normal range of motion. Neck supple. Carotid bruit is not present.  Cardiovascular: Normal rate and regular rhythm.   Pulmonary/Chest: Effort normal and breath sounds normal. No respiratory distress. She has no wheezes. She has no rales.  Musculoskeletal: She exhibits no edema.  Lymphadenopathy:    She has no cervical adenopathy.  Neurological: She is alert. She has normal reflexes.  Skin: Skin is warm and dry. No rash noted. No pallor.  Lips are peeling and dry No cracking or erythema   Psychiatric: She has a normal mood and affect.          Assessment & Plan:    Problem List Items Addressed This Visit     Cardiovascular and Mediastinum   HYPERTENSION - Primary      bp in fair control at this time  BP Readings from Last 1 Encounters:  05/31/14 104/68   No changes needed Disc lifstyle change with low sodium diet and exercise      Relevant Medications      atorvastatin (LIPITOR) tablet     Digestive   Lip pain     Suspect they were sunburned  Adv to use non scented lip balm with spf (like nivea)-update if  no further imp       Other   Hyperlipidemia     Low HDL/ high LDL Trial of atorvastatin 10 mg daily-disc side eff in detail  Rev low sat fat diet -handout given  Lab 6 wk    Relevant Medications      atorvastatin (LIPITOR) tablet   Other Relevant Orders      ALT      AST      Lipid panel

## 2014-05-31 NOTE — Patient Instructions (Signed)
Take generic lipitor (atorvastatin) 10 mg daily in the evening  Eat a low fat diet  Try to get more exercise  Consider fish oil caps- over the counter -they may help increase good cholesterol along with exercise  Schedule fasting lab in 6 weeks   Fat and Cholesterol Control Diet Fat and cholesterol levels in your blood and organs are influenced by your diet. High levels of fat and cholesterol may lead to diseases of the heart, small and large blood vessels, gallbladder, liver, and pancreas. CONTROLLING FAT AND CHOLESTEROL WITH DIET Although exercise and lifestyle factors are important, your diet is key. That is because certain foods are known to raise cholesterol and others to lower it. The goal is to balance foods for their effect on cholesterol and more importantly, to replace saturated and trans fat with other types of fat, such as monounsaturated fat, polyunsaturated fat, and omega-3 fatty acids. On average, a person should consume no more than 15 to 17 g of saturated fat daily. Saturated and trans fats are considered "bad" fats, and they will raise LDL cholesterol. Saturated fats are primarily found in animal products such as meats, butter, and cream. However, that does not mean you need to give up all your favorite foods. Today, there are good tasting, low-fat, low-cholesterol substitutes for most of the things you like to eat. Choose low-fat or nonfat alternatives. Choose round or loin cuts of red meat. These types of cuts are lowest in fat and cholesterol. Chicken (without the skin), fish, veal, and ground [place] breast are great choices. Eliminate fatty meats, such as hot dogs and salami. Even shellfish have little or no saturated fat. Have a 3 oz (85 g) portion when you eat lean meat, poultry, or fish. Trans fats are also called "partially hydrogenated oils." They are oils that have been scientifically manipulated so that they are solid at room temperature resulting in a longer shelf life and  improved taste and texture of foods in which they are added. Trans fats are found in stick margarine, some tub margarines, cookies, crackers, and baked goods.  When baking and cooking, oils are a great substitute for butter. The monounsaturated oils are especially beneficial since it is believed they lower LDL and raise HDL. The oils you should avoid entirely are saturated tropical oils, such as coconut and palm.  Remember to eat a lot from food groups that are naturally free of saturated and trans fat, including fish, fruit, vegetables, beans, grains (barley, rice, couscous, bulgur wheat), and pasta (without cream sauces).  IDENTIFYING FOODS THAT LOWER FAT AND CHOLESTEROL  Soluble fiber may lower your cholesterol. This type of fiber is found in fruits such as apples, vegetables such as broccoli, potatoes, and carrots, legumes such as beans, peas, and lentils, and grains such as barley. Foods fortified with plant sterols (phytosterol) may also lower cholesterol. You should eat at least 2 g per day of these foods for a cholesterol lowering effect.  Read package labels to identify low-saturated fats, trans fat free, and low-fat foods at the supermarket. Select cheeses that have only 2 to 3 g saturated fat per ounce. Use a heart-healthy tub margarine that is free of trans fats or partially hydrogenated oil. When buying baked goods (cookies, crackers), avoid partially hydrogenated oils. Breads and muffins should be made from whole grains (whole-wheat or whole oat flour, instead of "flour" or "enriched flour"). Buy non-creamy canned soups with reduced salt and no added fats.  FOOD PREPARATION TECHNIQUES  Never deep-fry.  If you must fry, either stir-fry, which uses very little fat, or use non-stick cooking sprays. When possible, broil, bake, or roast meats, and steam vegetables. Instead of putting butter or margarine on vegetables, use lemon and herbs, applesauce, and cinnamon (for squash and sweet potatoes). Use  nonfat yogurt, salsa, and low-fat dressings for salads.  LOW-SATURATED FAT / LOW-FAT FOOD SUBSTITUTES Meats / Saturated Fat (g)  Avoid: Steak, marbled (3 oz/85 g) / 11 g  Choose: Steak, lean (3 oz/85 g) / 4 g  Avoid: Hamburger (3 oz/85 g) / 7 g  Choose: Hamburger, lean (3 oz/85 g) / 5 g  Avoid: Ham (3 oz/85 g) / 6 g  Choose: Ham, lean cut (3 oz/85 g) / 2.4 g  Avoid: Chicken, with skin, dark meat (3 oz/85 g) / 4 g  Choose: Chicken, skin removed, dark meat (3 oz/85 g) / 2 g  Avoid: Chicken, with skin, light meat (3 oz/85 g) / 2.5 g  Choose: Chicken, skin removed, light meat (3 oz/85 g) / 1 g Dairy / Saturated Fat (g)  Avoid: Whole milk (1 cup) / 5 g  Choose: Low-fat milk, 2% (1 cup) / 3 g  Choose: Low-fat milk, 1% (1 cup) / 1.5 g  Choose: Skim milk (1 cup) / 0.3 g  Avoid: Hard cheese (1 oz/28 g) / 6 g  Choose: Skim milk cheese (1 oz/28 g) / 2 to 3 g  Avoid: Cottage cheese, 4% fat (1 cup) / 6.5 g  Choose: Low-fat cottage cheese, 1% fat (1 cup) / 1.5 g  Avoid: Ice cream (1 cup) / 9 g  Choose: Sherbet (1 cup) / 2.5 g  Choose: Nonfat frozen yogurt (1 cup) / 0.3 g  Choose: Frozen fruit bar / trace  Avoid: Whipped cream (1 tbs) / 3.5 g  Choose: Nondairy whipped topping (1 tbs) / 1 g Condiments / Saturated Fat (g)  Avoid: Mayonnaise (1 tbs) / 2 g  Choose: Low-fat mayonnaise (1 tbs) / 1 g  Avoid: Butter (1 tbs) / 7 g  Choose: Extra light margarine (1 tbs) / 1 g  Avoid: Coconut oil (1 tbs) / 11.8 g  Choose: Olive oil (1 tbs) / 1.8 g  Choose: Corn oil (1 tbs) / 1.7 g  Choose: Safflower oil (1 tbs) / 1.2 g  Choose: Sunflower oil (1 tbs) / 1.4 g  Choose: Soybean oil (1 tbs) / 2.4 g  Choose: Canola oil (1 tbs) / 1 g Document Released: 10/28/2005 Document Revised: 02/22/2013 Document Reviewed: 04/18/2011 Exit[employer] Patient Information 2015 [place], [place]. This information is not intended to replace advice given to you by your health care provider. Make sure  you discuss any questions you have with your health care provider.

## 2014-05-31 NOTE — Assessment & Plan Note (Signed)
Suspect they were sunburned  Adv to use non scented lip balm with spf (like nivea)-update if no further imp

## 2014-05-31 NOTE — Assessment & Plan Note (Signed)
Low HDL/ high LDL Trial of atorvastatin 10 mg daily-disc side eff in detail  Rev low sat fat diet -handout given  Lab 6 wk

## 2014-05-31 NOTE — Progress Notes (Signed)
Pre visit review using our clinic review tool, if applicable. No additional management support is needed unless otherwise documented below in the visit note. 

## 2014-05-31 NOTE — Assessment & Plan Note (Signed)
bp in fair control at this time  BP Readings from Last 1 Encounters:  05/31/14 104/68   No changes needed Disc lifstyle change with low sodium diet and exercise

## 2014-07-12 ENCOUNTER — Other Ambulatory Visit (INDEPENDENT_AMBULATORY_CARE_PROVIDER_SITE_OTHER): Payer: Medicare PPO

## 2014-07-12 DIAGNOSIS — E785 Hyperlipidemia, unspecified: Secondary | ICD-10-CM

## 2014-07-12 LAB — LIPID PANEL
CHOL/HDL RATIO: 3
Cholesterol: 160 mg/dL (ref 0–200)
HDL: 57.3 mg/dL (ref >39.00)
LDL Cholesterol: 88 mg/dL (ref 0–99)
NONHDL: 102.7
TRIGLYCERIDES: 76 mg/dL (ref 0.0–149.0)
VLDL: 15.2 mg/dL (ref 0.0–40.0)

## 2014-07-12 LAB — ALT: ALT: 36 U/L — AB (ref 0–35)

## 2014-07-12 LAB — AST: AST: 36 U/L (ref 0–37)

## 2014-07-13 ENCOUNTER — Encounter (HOSPITAL_COMMUNITY): Payer: Medicare PPO | Attending: Hematology and Oncology

## 2014-07-13 ENCOUNTER — Encounter (HOSPITAL_COMMUNITY): Payer: Self-pay

## 2014-07-13 ENCOUNTER — Encounter (HOSPITAL_BASED_OUTPATIENT_CLINIC_OR_DEPARTMENT_OTHER): Payer: Medicare PPO

## 2014-07-13 ENCOUNTER — Encounter: Payer: Self-pay | Admitting: *Deleted

## 2014-07-13 ENCOUNTER — Ambulatory Visit (HOSPITAL_COMMUNITY): Payer: Medicare HMO | Admitting: Oncology

## 2014-07-13 VITALS — BP 123/66 | HR 59 | Temp 97.9°F | Resp 18 | Wt 181.0 lb

## 2014-07-13 DIAGNOSIS — I10 Essential (primary) hypertension: Secondary | ICD-10-CM | POA: Diagnosis not present

## 2014-07-13 DIAGNOSIS — D7389 Other diseases of spleen: Secondary | ICD-10-CM

## 2014-07-13 DIAGNOSIS — D739 Disease of spleen, unspecified: Secondary | ICD-10-CM

## 2014-07-13 DIAGNOSIS — D704 Cyclic neutropenia: Secondary | ICD-10-CM | POA: Diagnosis not present

## 2014-07-13 DIAGNOSIS — K219 Gastro-esophageal reflux disease without esophagitis: Secondary | ICD-10-CM | POA: Insufficient documentation

## 2014-07-13 DIAGNOSIS — D696 Thrombocytopenia, unspecified: Secondary | ICD-10-CM

## 2014-07-13 DIAGNOSIS — E611 Iron deficiency: Secondary | ICD-10-CM

## 2014-07-13 DIAGNOSIS — D709 Neutropenia, unspecified: Secondary | ICD-10-CM

## 2014-07-13 DIAGNOSIS — D509 Iron deficiency anemia, unspecified: Secondary | ICD-10-CM

## 2014-07-13 DIAGNOSIS — D693 Immune thrombocytopenic purpura: Secondary | ICD-10-CM | POA: Diagnosis not present

## 2014-07-13 DIAGNOSIS — D72819 Decreased white blood cell count, unspecified: Secondary | ICD-10-CM

## 2014-07-13 LAB — CBC WITH DIFFERENTIAL/PLATELET
BASOS PCT: 0 % (ref 0–1)
Basophils Absolute: 0 10*3/uL (ref 0.0–0.1)
Eosinophils Absolute: 0 10*3/uL (ref 0.0–0.7)
Eosinophils Relative: 1 % (ref 0–5)
HEMATOCRIT: 39.9 % (ref 36.0–46.0)
HEMOGLOBIN: 13.2 g/dL (ref 12.0–15.0)
LYMPHS ABS: 1.3 10*3/uL (ref 0.7–4.0)
LYMPHS PCT: 48 % — AB (ref 12–46)
MCH: 32.6 pg (ref 26.0–34.0)
MCHC: 33.1 g/dL (ref 30.0–36.0)
MCV: 98.5 fL (ref 78.0–100.0)
Monocytes Absolute: 0.3 10*3/uL (ref 0.1–1.0)
Monocytes Relative: 11 % (ref 3–12)
NEUTROS ABS: 1 10*3/uL — AB (ref 1.7–7.7)
NEUTROS PCT: 39 % — AB (ref 43–77)
Platelets: 90 10*3/uL — ABNORMAL LOW (ref 150–400)
RBC: 4.05 MIL/uL (ref 3.87–5.11)
RDW: 12.2 % (ref 11.5–15.5)
WBC: 2.7 10*3/uL — ABNORMAL LOW (ref 4.0–10.5)

## 2014-07-13 LAB — FERRITIN: Ferritin: 126 ng/mL (ref 10–291)

## 2014-07-13 NOTE — Patient Instructions (Signed)
[facility] Discharge Instructions  RECOMMENDATIONS MADE BY THE CONSULTANT AND ANY TEST RESULTS WILL BE SENT TO YOUR REFERRING PHYSICIAN.  Return in one year for lab work and office visit. Watch for bruising, unusual bleeding or bleeding that is difficult to stop, and fever (call us and/or report to ED should this occur).  Thank you for choosing [facility] to provide your oncology and hematology care.  To afford each patient quality time with our providers, please arrive at least 15 minutes before your scheduled appointment time.  With your help, our goal is to use those 15 minutes to complete the necessary work-up to ensure our physicians have the information they need to help with your evaluation and healthcare recommendations.    Effective January 1st, 2014, we ask that you re-schedule your appointment with our physicians should you arrive 10 or more minutes late for your appointment.  We strive to give you quality time with our providers, and arriving late affects you and other patients whose appointments are after yours.    Again, thank you for choosing [facility].  Our hope is that these requests will decrease the amount of time that you wait before being seen by our physicians.       _____________________________________________________________  Should you have questions after your visit to [facility], please contact our office at [phone] between the hours of 8:30 a.m. and 4:30 p.m.  Voicemails left after 4:30 p.m. will not be returned until the following business day.  For prescription refill requests, have your pharmacy contact our office with your prescription refill request.    _______________________________________________________________  We hope that we have given you very good care.  You may receive a patient satisfaction survey in the mail, please complete it and return it as soon as possible.  We value your  feedback!  _______________________________________________________________  Have you asked about our STAR program?  STAR stands for Survivorship Training and Rehabilitation, and this is a nationally recognized cancer care program that focuses on survivorship and rehabilitation.  Cancer and cancer treatments may cause problems, such as, pain, making you feel tired and keeping you from doing the things that you need or want to do. Cancer rehabilitation can help. Our goal is to reduce these troubling effects and help you have the best quality of life possible.  You may receive a survey from a nurse that asks questions about your current state of health.  Based on the survey results, all eligible patients will be referred to the [facility] program for an evaluation so we can better serve you!  A frequently asked questions sheet is available upon request.

## 2014-07-13 NOTE — Progress Notes (Signed)
[place], MD [facility] [address], [place] [address]  DIAGNOSIS: Thrombocytopenia, unspecified - Plan: CBC with Differential  Neutropenia, unspecified - Plan: CBC with Differential  Splenic lesion - Plan: CBC with Differential  Iron deficiency - Plan: Ferritin  Chief Complaint  Patient presents with  . Leukopenia and thrombocytopenia    CURRENT THERAPY: Watchful expectation for leukopenia and thrombocytopenia.  INTERVAL HISTORY: Kathryn Weaver 68 y.o. female returns for followup of neutropenia and thrombocytopenia without any treatment in the past.  She is recently back from vacation in [place]. Appetite is excellent with no nausea, vomiting, easy bruising, nasal drip, sore throat, epistaxis, melena, hematochezia, hematuria, or vaginal bleeding. She denies any incontinence, lower extremity swelling or redness, joint pain, skin rash, headache, or seizures.  MEDICAL HISTORY: Past Medical History  Diagnosis Date  . Hypertension   . Anemia     s/p GI bleed, hospital 5/08  . Adenomatous polyp of colon 03/2007  . GERD (gastroesophageal reflux disease)   . Hiatal hernia     EGD 5/08- stricture, HH, gastritis, GERD  . Depression   . Hypertension   . Endometriosis     uterine US 06/2003  . Diverticulosis   . Iron deficiency 03/16/2014    INTERIM HISTORY: has THROMBOCYTOPENIA; DEPRESSION; HYPERTENSION; GERD; DIVERTICULAR DISEASE; POSTMENOPAUSAL STATUS; TRANSAMINASES, SERUM, ELEVATED; HX, PERSONAL, COLONIC POLYPS; LEUKOCYTOPENIA UNSPECIFIED; Chest pain; Obesity; Routine general medical examination at a health care facility; Hyperlipidemia; Abdominal pain; Pain of right leg; Numbness of toes; Intolerance to fatty food; Stress reaction; Esophageal dysphagia; Encounter for Medicare annual wellness exam; Uterine prolapse; Iron deficiency; Internal hemorrhoid; and Lip pain on her  problem list.    ALLERGIES:  has No Known Allergies.  MEDICATIONS: has a current medication list which includes the following prescription(s): atorvastatin, esomeprazole magnesium, hydrochlorothiazide, multiple vitamins-minerals, naproxen sodium, sertraline, and hydrocortisone.  SURGICAL HISTORY:  Past Surgical History  Procedure Laterality Date  . Tubal ligation    . Cardiac ct  9/12    normal     FAMILY HISTORY: family history includes Cancer in her father; Hypertension in her mother. There is no history of Colon cancer, Esophageal cancer, Rectal cancer, or Stomach cancer.  SOCIAL HISTORY:  reports that she has never smoked. She has never used smokeless tobacco. She reports that she does not drink alcohol or use illicit drugs.  REVIEW OF SYSTEMS:  Other than that discussed above is noncontributory.  PHYSICAL EXAMINATION: ECOG PERFORMANCE STATUS: 0 - Asymptomatic  Blood pressure 123/66, pulse 59, temperature 97.9 F (36.6 C), temperature source Oral, resp. rate 18, weight 181 lb (82.101 kg), SpO2 100.00%.  GENERAL:alert, no distress and comfortable SKIN: skin color, texture, turgor are normal, no rashes or significant lesions. No ecchymoses. EYES: PERLA; Conjunctiva are pink and non-injected, sclera clear SINUSES: No redness or tenderness over maxillary or ethmoid sinuses OROPHARYNX:no exudate, no erythema on lips, buccal mucosa, or tongue. NECK: supple, thyroid normal size, non-tender, without nodularity. No masses CHEST: Normal AP diameter with no breast masses. LYMPH:  no palpable lymphadenopathy in the cervical, axillary or inguinal LUNGS: clear to auscultation and percussion with normal breathing effort HEART: regular rate & rhythm and no murmurs. ABDOMEN:abdomen soft, non-tender and normal bowel sounds. Liver and spleen not palpable. MUSCULOSKELETAL:no cyanosis of digits and no clubbing. Range of motion normal.  NEURO: alert & oriented x 3 with fluent speech, no focal  motor/sensory deficits   LABORATORY DATA: Appointment on 07/13/2014  Component Date Value Ref Range Status  . WBC 07/13/2014 2.7* 4.0 - 10.5 K/uL Final  . RBC 07/13/2014 4.05  3.87 - 5.11 MIL/uL Final  . Hemoglobin 07/13/2014 13.2  12.0 - 15.0 g/dL Final  . HCT 07/13/2014 39.9  36.0 - 46.0 % Final  . MCV 07/13/2014 98.5  78.0 - 100.0 fL Final  . MCH 07/13/2014 32.6  26.0 - 34.0 pg Final  . MCHC 07/13/2014 33.1  30.0 - 36.0 g/dL Final  . RDW 07/13/2014 12.2  11.5 - 15.5 % Final  . Platelets 07/13/2014 90* 150 - 400 K/uL Final   Comment: SPECIMEN CHECKED FOR CLOTS                          CONSISTENT WITH PREVIOUS RESULT  . Neutrophils Relative % 07/13/2014 39* 43 - 77 % Final  . Neutro Abs 07/13/2014 1.0* 1.7 - 7.7 K/uL Final  . Lymphocytes Relative 07/13/2014 48* 12 - 46 % Final  . Lymphs Abs 07/13/2014 1.3  0.7 - 4.0 K/uL Final  . Monocytes Relative 07/13/2014 11  3 - 12 % Final  . Monocytes Absolute 07/13/2014 0.3  0.1 - 1.0 K/uL Final  . Eosinophils Relative 07/13/2014 1  0 - 5 % Final  . Eosinophils Absolute 07/13/2014 0.0  0.0 - 0.7 K/uL Final  . Basophils Relative 07/13/2014 0  0 - 1 % Final  . Basophils Absolute 07/13/2014 0.0  0.0 - 0.1 K/uL Final  Lab on 07/12/2014  Component Date Value Ref Range Status  . ALT 07/12/2014 36* 0 - 35 U/L Final  . AST 07/12/2014 36  0 - 37 U/L Final  . Cholesterol 07/12/2014 160  0 - 200 mg/dL Final   ATP III Classification       Desirable:  < 200 mg/dL               Borderline High:  200 - 239 mg/dL          High:  > = 240 mg/dL  . Triglycerides 07/12/2014 76.0  0.0 - 149.0 mg/dL Final   Normal:  <150 mg/dLBorderline High:  150 - 199 mg/dL  . HDL 07/12/2014 57.30  >39.00 mg/dL Final  . VLDL 07/12/2014 15.2  0.0 - 40.0 mg/dL Final  . LDL Cholesterol 07/12/2014 88  0 - 99 mg/dL Final  . Total CHOL/HDL Ratio 07/12/2014 3   Final                  Men          Women1/2 Average Risk     3.4          3.3Average Risk          5.0          4.42X  Average Risk          9.6          7.13X Average Risk          15.0          11.0                      . NonHDL 07/12/2014 102.70   Final   NOTE:  Non-HDL goal should be 30 mg/dL higher than patient's LDL goal (i.e. LDL goal of < 70 mg/dL, would have non-HDL goal of < 100 mg/dL)    PATHOLOGY:  Peripheral smear fails to demonstrate platelet clumping.  Urinalysis    Component Value Date/Time   COLORURINE yellow 08/06/2007 0844   APPEARANCEUR Hazy 08/06/2007 0844   LABSPEC 1.010 08/06/2007 0844   PHURINE 7.5 08/06/2007 0844   HGBUR large 08/06/2007 0844   BILIRUBINUR negative 01/21/2012 1035   BILIRUBINUR negative 08/06/2007 0844   PROTEINUR negative 01/21/2012 1035   UROBILINOGEN negative 01/21/2012 1035   UROBILINOGEN 0.2 08/06/2007 0844   NITRITE negative 01/21/2012 1035   NITRITE negative 08/06/2007 0844   LEUKOCYTESUR moderate (2+) 01/21/2012 1035    RADIOGRAPHIC STUDIES: MM DIGITAL SCREENING BILATERAL Status: Final result            Study Result    CLINICAL DATA: Screening.  EXAM:  DIGITAL SCREENING BILATERAL MAMMOGRAM WITH CAD  COMPARISON: Previous exam(s).  ACR Breast Density Category b: There are scattered areas of  fibroglandular density.  FINDINGS:  There are no findings suspicious for malignancy. Images were  processed with CAD.  IMPRESSION:  No mammographic evidence of malignancy. A result letter of this  screening mammogram will be mailed directly to the patient.  RECOMMENDATION:  Screening mammogram in one year. (Code:SM-B-01Y)  BI-RADS CATEGORY 1: Negative.  Electronically Signed  By: Lovey Newcomer M.D.  On: 12/27/2013 15     ASSESSMENT:  #1. Benign cyclic neutropenia. #2. Immune thrombocytopenia, stable. #3. Splenic granuloma, normal ACE level.   PLAN:  #1. Call if develops fever, easy bruising, or any bleeding. #2. Followup in one year with CBC, chem profile, LDH.   All questions were answered. The patient knows to call the clinic with any problems,  questions or concerns. We can certainly see the patient much sooner if necessary.   I spent 25 minutes counseling the patient face to face. The total time spent in the appointment was 30 minutes.    Doroteo Bradford, MD 07/13/2014 10:20 AM  DISCLAIMER:  This note was dictated with voice recognition software.  Similar sounding words can inadvertently be transcribed inaccurately and may not be corrected upon review.

## 2014-07-13 NOTE — Progress Notes (Signed)
Labs for cbcd,ferr 

## 2014-12-28 ENCOUNTER — Other Ambulatory Visit: Payer: Self-pay

## 2014-12-28 DIAGNOSIS — Z1231 Encounter for screening mammogram for malignant neoplasm of breast: Secondary | ICD-10-CM

## 2015-01-13 ENCOUNTER — Ambulatory Visit
Admission: RE | Admit: 2015-01-13 | Discharge: 2015-01-13 | Disposition: A | Discharge status: Home / Self Care | Payer: Medicare PPO | Source: Ambulatory Visit

## 2015-01-13 DIAGNOSIS — Z1231 Encounter for screening mammogram for malignant neoplasm of breast: Secondary | ICD-10-CM

## 2015-01-16 ENCOUNTER — Telehealth: Payer: Self-pay

## 2015-01-16 NOTE — Telephone Encounter (Signed)
-----   Message from Helene Shoe, LPN sent at 04/15/5373  3:34 PM EST ----- Pt left v/m requesting [phone].

## 2015-01-16 NOTE — Telephone Encounter (Signed)
-----   Message from Abner Greenspan, MD sent at 01/15/2015 11:54 AM EST ----- Mammogram is normal  Please note for flow sheet if you can  Due for next screening mammogram in 1 year

## 2015-01-16 NOTE — Telephone Encounter (Signed)
Left message for patient to call office regarding normal mammogram.

## 2015-01-16 NOTE — Telephone Encounter (Signed)
Patient aware of normal mammogram.

## 2015-03-27 ENCOUNTER — Ambulatory Visit (INDEPENDENT_AMBULATORY_CARE_PROVIDER_SITE_OTHER): Payer: Medicare PPO | Admitting: Family Medicine

## 2015-03-27 ENCOUNTER — Encounter: Payer: Self-pay | Admitting: Family Medicine

## 2015-03-27 VITALS — BP 122/68 | HR 53 | Temp 97.5°F | Ht 63.0 in | Wt 182.5 lb

## 2015-03-27 DIAGNOSIS — T148 Other injury of unspecified body region: Secondary | ICD-10-CM | POA: Diagnosis not present

## 2015-03-27 DIAGNOSIS — T148XXA Other injury of unspecified body region, initial encounter: Secondary | ICD-10-CM

## 2015-03-27 MED ORDER — SERTRALINE HCL 50 MG PO TABS: ORAL_TABLET | ORAL | Status: DC

## 2015-03-27 MED ORDER — HYDROCHLOROTHIAZIDE 25 MG PO TABS: 25.0000 mg | ORAL_TABLET | Freq: Every day | ORAL | Status: DC

## 2015-03-27 NOTE — Progress Notes (Signed)
Subjective:    Patient ID: Kathryn Weaver, female    DOB: 1946-05-07, 69 y.o.   MRN: [id number]  HPI Here for abdominal pain last week   She had low abdominal cramping "like it is sore"  This is now better  She has to change diapers frequently and large baby kicked her in the abdomen - was sore to the touch  No bowel changes or nausea or cramping   Now she stands to the side when changing diapers   GERD is very well controlled with PPI No fever/ feels ok   No n/v or d or blood in stools   Patient Active Problem List   Diagnosis Date Noted  . Lip pain 05/31/2014  . Internal hemorrhoid 04/20/2014  . Iron deficiency 03/16/2014  . Encounter for Medicare annual wellness exam 03/01/2014  . Uterine prolapse 03/01/2014  . Esophageal dysphagia 01/05/2013  . Stress reaction 09/01/2012  . Intolerance to fatty food 08/11/2012  . Abdominal pain 08/07/2012  . Pain of right leg 08/07/2012  . Numbness of toes 08/07/2012  . Hyperlipidemia 04/28/2012  . Routine general medical examination at a health care facility 04/20/2012  . Obesity 02/05/2012  . Chest pain 08/02/2011  . LEUKOCYTOPENIA UNSPECIFIED 10/23/2010  . THROMBOCYTOPENIA 10/24/2009  . TRANSAMINASES, SERUM, ELEVATED 10/24/2009  . POSTMENOPAUSAL STATUS 08/09/2008  . DEPRESSION 08/05/2007  . HYPERTENSION 08/05/2007  . GERD 04/20/2007  . DIVERTICULAR DISEASE 04/20/2007  . HX, PERSONAL, COLONIC POLYPS 04/13/2007   Past Medical History  Diagnosis Date  . Hypertension   . Anemia     s/p GI bleed, hospital 5/08  . Adenomatous polyp of colon 03/2007  . GERD (gastroesophageal reflux disease)   . Hiatal hernia     EGD 5/08- stricture, HH, gastritis, GERD  . Depression   . Hypertension   . Endometriosis     uterine US 06/2003  . Diverticulosis   . Iron deficiency 03/16/2014   Past Surgical History  Procedure Laterality Date  . Tubal ligation    . Cardiac ct  9/12    normal    History  Substance Use Topics  . Smoking  status: Never Smoker   . Smokeless tobacco: Never Used  . Alcohol Use: No   Family History  Problem Relation Age of Onset  . Hypertension Mother   . Cancer Father     unknown type  . Colon cancer Neg Hx   . Esophageal cancer Neg Hx   . Rectal cancer Neg Hx   . Stomach cancer Neg Hx    No Known Allergies Current Outpatient Prescriptions on File Prior to Visit  Medication Sig Dispense Refill  . atorvastatin (LIPITOR) 10 MG tablet Take 1 tablet (10 mg total) by mouth daily. 30 tablet 11  . Esomeprazole Magnesium (NEXIUM 24HR PO) Take 22.3 mg by mouth daily.    . hydrochlorothiazide (HYDRODIURIL) 25 MG tablet Take 1 tablet (25 mg total) by mouth daily. 90 tablet 3  . hydrocortisone (ANUSOL-HC) 25 MG suppository Place 1 suppository (25 mg total) rectally at bedtime. 12 suppository 1  . Multiple Vitamins-Minerals (CENTRUM PO) Take one by mouth daily     . Naproxen Sodium (ALEVE PO) Take 1 tablet by mouth as needed.    . sertraline (ZOLOFT) 50 MG tablet TAKE 1 TABLET EVERY DAY 90 tablet 3  . [DISCONTINUED] ranitidine (ZANTAC) 150 MG capsule Take 1 capsule (150 mg total) by mouth 2 (two) times daily. For 3 months 60 capsule 2   No  current facility-administered medications on file prior to visit.      Review of Systems Review of Systems  Constitutional: Negative for fever, appetite change, fatigue and unexpected weight change.  Eyes: Negative for pain and visual disturbance.  Respiratory: Negative for cough and shortness of breath.   Cardiovascular: Negative for cp or palpitations    Gastrointestinal: Negative for nausea, diarrhea and constipation. pos for abd pain last wk that is resolved  Genitourinary: Negative for urgency and frequency.  Skin: Negative for pallor or rash   Neurological: Negative for weakness, light-headedness, numbness and headaches.  Hematological: Negative for adenopathy. Does not bruise/bleed easily.  Psychiatric/Behavioral: Negative for dysphoric mood. The  patient is not nervous/anxious.  (doing well on sertraline)       Objective:   Physical Exam  Constitutional: She appears well-developed and well-nourished. No distress.  obese and well appearing   HENT:  Head: Normocephalic and atraumatic.  Eyes: Conjunctivae and EOM are normal. Pupils are equal, round, and reactive to light. No scleral icterus.  Neck: Normal range of motion. Neck supple.  Cardiovascular: Normal rate and regular rhythm.   Pulmonary/Chest: Effort normal and breath sounds normal. No respiratory distress.  Abdominal: Soft. Bowel sounds are normal. She exhibits no distension, no abdominal bruit and no mass. There is no hepatosplenomegaly. There is no tenderness. There is no rebound, no guarding, no CVA tenderness, no tenderness at McBurney's point and negative Murphy's sign.  Musculoskeletal: She exhibits no edema.  Lymphadenopathy:    She has no cervical adenopathy.  Neurological: She is alert.  Skin: Skin is warm and dry. No rash noted. No erythema. No pallor.  Psychiatric: She has a normal mood and affect.          Assessment & Plan:   Problem List Items Addressed This Visit    Contusion - Primary    From getting kicked by baby during diaper change- several times last week Symptoms resolved now and reassuring exam  No classic GI symptoms /bowel change  Disc changing technique for diaper change-she has changed positions and stopped the trauma  Will update if symptoms return or any GI problems

## 2015-03-27 NOTE — Progress Notes (Signed)
Pre visit review using our clinic review tool, if applicable. No additional management support is needed unless otherwise documented below in the visit note. 

## 2015-03-27 NOTE — Patient Instructions (Addendum)
If symptoms re occur - please let me know, especially if you develop bowel change or fever  Try to avoid future injury Take care of yourself   Schedule your annual exam on the way out

## 2015-03-27 NOTE — Assessment & Plan Note (Signed)
From getting kicked by baby during diaper change- several times last week Symptoms resolved now and reassuring exam  No classic GI symptoms /bowel change  Disc changing technique for diaper change-she has changed positions and stopped the trauma  Will update if symptoms return or any GI problems

## 2015-05-08 ENCOUNTER — Other Ambulatory Visit: Payer: Self-pay | Admitting: Family Medicine

## 2015-05-30 ENCOUNTER — Other Ambulatory Visit: Payer: Self-pay | Admitting: Family Medicine

## 2015-06-26 ENCOUNTER — Encounter: Payer: Self-pay | Admitting: Primary Care

## 2015-06-26 ENCOUNTER — Ambulatory Visit (INDEPENDENT_AMBULATORY_CARE_PROVIDER_SITE_OTHER): Payer: Medicare PPO | Admitting: Primary Care

## 2015-06-26 VITALS — BP 112/70 | HR 58 | Temp 98.6°F | Ht 63.0 in | Wt 177.0 lb

## 2015-06-26 DIAGNOSIS — R21 Rash and other nonspecific skin eruption: Secondary | ICD-10-CM | POA: Diagnosis not present

## 2015-06-26 MED ORDER — TRIAMCINOLONE ACETONIDE 0.5 % EX CREA: 1.0000 "application " | TOPICAL_CREAM | Freq: Two times a day (BID) | CUTANEOUS | Status: DC

## 2015-06-26 NOTE — Progress Notes (Signed)
Pre visit review using our clinic review tool, if applicable. No additional management support is needed unless otherwise documented below in the visit note. 

## 2015-06-26 NOTE — Progress Notes (Signed)
Subjective:    Patient ID: Early Chars, female    DOB: April 14, 1946, 69 y.o.   MRN: [id number]  HPI  [name] is a 69 year old female who presents today with a chief complaint of rash. She first noticed her rash on 05/22/2015 which is present to the creases of both elbows and hands. She reports itching with swelling and redness over the past week. Denies pain. She's been using cortisone 10 OTC without relief. She endorses a history of eczema as a child but has not had an outbreak since.  Review of Systems  Constitutional: Negative for fever and chills.  Respiratory: Negative for shortness of breath.   Cardiovascular: Negative for chest pain.       Past Medical History  Diagnosis Date  . Hypertension   . Anemia     s/p GI bleed, hospital 5/08  . Adenomatous polyp of colon 03/2007  . GERD (gastroesophageal reflux disease)   . Hiatal hernia     EGD 5/08- stricture, HH, gastritis, GERD  . Depression   . Hypertension   . Endometriosis     uterine US 06/2003  . Diverticulosis   . Iron deficiency 03/16/2014    Social History   Social History  . Marital Status: Married    Spouse Name: N/A  . Number of Children: N/A  . Years of Education: N/A   Occupational History  . [employer] History Main Topics  . Smoking status: Never Smoker   . Smokeless tobacco: Never Used  . Alcohol Use: No  . Drug Use: No  . Sexual Activity: Yes   Other Topics Concern  . Not on file   Social History Narrative    Past Surgical History  Procedure Laterality Date  . Tubal ligation    . Cardiac ct  9/12    normal     Family History  Problem Relation Age of Onset  . Hypertension Mother   . Cancer Father     unknown type  . Colon cancer Neg Hx   . Esophageal cancer Neg Hx   . Rectal cancer Neg Hx   . Stomach cancer Neg Hx     No Known Allergies  Current Outpatient Prescriptions on File Prior to Visit  Medication Sig Dispense Refill  . atorvastatin (LIPITOR)  10 MG tablet TAKE 1 TABLET (10 MG TOTAL) BY MOUTH DAILY. 30 tablet 5  . Esomeprazole Magnesium (NEXIUM 24HR PO) Take 22.3 mg by mouth daily.    . hydrochlorothiazide (HYDRODIURIL) 25 MG tablet TAKE 1 TABLET BY MOUTH ONCE DAILY. 90 tablet 1  . Multiple Vitamins-Minerals (CENTRUM PO) Take one by mouth daily     . sertraline (ZOLOFT) 50 MG tablet TAKE 1 TABLET EVERY DAY 90 tablet 3  . Naproxen Sodium (ALEVE PO) Take 1 tablet by mouth as needed.    . [DISCONTINUED] ranitidine (ZANTAC) 150 MG capsule Take 1 capsule (150 mg total) by mouth 2 (two) times daily. For 3 months 60 capsule 2   No current facility-administered medications on file prior to visit.    BP 112/70 mmHg  Pulse 58  Temp(Src) 98.6 F (37 C) (Oral)  Ht 5\' 3"  (1.6 m)  Wt 177 lb (80.287 kg)  BMI 31.36 kg/m2  SpO2 98%    Objective:   Physical Exam  Constitutional: She appears well-nourished.  Cardiovascular: Normal rate and regular rhythm.   Pulmonary/Chest: Effort normal and breath sounds normal.  Skin: Skin is warm and  dry. Rash noted.  Rash representing eczema present to bilateral creases of elbows and hands. Skin intact.           Assessment & Plan:  Rash:  Present to creases of elbows and hands bilaterally. Represents eczema. Skin Intact. Denies recent contact with bushes, plants, etc. RX for triamcinolone 0.5% cream BID. Follow up PRN

## 2015-06-26 NOTE — Patient Instructions (Signed)
Apply the triamcinolone cream twice daily to your rashes. Do not apply this to your face or lips.  Follow up if no improvement in 2-3 weeks.  It was a pleasure meeting you!

## 2015-07-13 ENCOUNTER — Other Ambulatory Visit (HOSPITAL_COMMUNITY): Payer: Medicare PPO

## 2015-07-13 ENCOUNTER — Ambulatory Visit (HOSPITAL_COMMUNITY): Payer: Medicare PPO | Admitting: Oncology

## 2015-08-02 ENCOUNTER — Other Ambulatory Visit: Payer: Self-pay | Admitting: *Deleted

## 2015-08-02 MED ORDER — ATORVASTATIN CALCIUM 10 MG PO TABS: ORAL_TABLET | ORAL | Status: DC

## 2015-08-02 NOTE — Telephone Encounter (Signed)
Received fax requesting Rx to be changed to a 90 day supply, done 

## 2015-08-14 ENCOUNTER — Telehealth: Payer: Self-pay | Admitting: Family Medicine

## 2015-08-14 NOTE — Telephone Encounter (Signed)
Called pt but family advise me she was sleep and to call back a little later

## 2015-08-14 NOTE — Telephone Encounter (Signed)
Pt notified of Dr. Marliss Coots comments pt said if knee doesn't improve she will schedule a f/u to have it checked out

## 2015-08-14 NOTE — Telephone Encounter (Signed)
If the atorvastatin was going to cause muscle or joint pain I would it expect to be all over - so doubt that is causing pain in one knee

## 2015-08-14 NOTE — Telephone Encounter (Signed)
Pt called stating she is taking aporvaspatin 10mg  She stated the back of her knee is sore.  Hurts when she gets up/going up stairs She wanted to know if the med could cause this Please advise

## 2015-08-18 ENCOUNTER — Ambulatory Visit (INDEPENDENT_AMBULATORY_CARE_PROVIDER_SITE_OTHER): Payer: Medicare PPO | Admitting: Primary Care

## 2015-08-18 ENCOUNTER — Encounter: Payer: Self-pay | Admitting: Primary Care

## 2015-08-18 VITALS — BP 110/60 | HR 85 | Temp 98.2°F | Wt 180.0 lb

## 2015-08-18 DIAGNOSIS — M79661 Pain in right lower leg: Secondary | ICD-10-CM | POA: Diagnosis not present

## 2015-08-18 NOTE — Progress Notes (Signed)
Subjective:    Patient ID: Kathryn Weaver, female    DOB: Feb 13, 1946, 69 y.o.   MRN: [id number]  HPI  Kathryn Weaver is a 69 year old female who presents today with a chief complaint of calf pain. Her pain is located to the right calf and she first noticed it one week ago. She describes her pain as "soreness" and is only noticable when applying pressure when lifting her grandaughter. Today she noticed pain to her right lower back that she notices after picking up her grandaughter. Denies numbness/tingling, calf swelling, redness, tenderness, fevers. She's been taking aleve for about 1 week intermittently with some relief. She is managed on lipitor 10 mg. She denies recent long travel and does not smoke.   Review of Systems  Constitutional: Negative for fever and chills.  Respiratory: Negative for shortness of breath.   Cardiovascular: Negative for chest pain.  Musculoskeletal: Positive for myalgias and back pain.  Skin: Negative for color change.       Past Medical History  Diagnosis Date  . Hypertension   . Anemia     s/p GI bleed, hospital 5/08  . Adenomatous polyp of colon 03/2007  . GERD (gastroesophageal reflux disease)   . Hiatal hernia     EGD 5/08- stricture, HH, gastritis, GERD  . Depression   . Hypertension   . Endometriosis     uterine US 06/2003  . Diverticulosis   . Iron deficiency 03/16/2014    Social History   Social History  . Marital Status: Married    Spouse Name: N/A  . Number of Children: N/A  . Years of Education: N/A   Occupational History  . [employer] History Main Topics  . Smoking status: Never Smoker   . Smokeless tobacco: Never Used  . Alcohol Use: No  . Drug Use: No  . Sexual Activity: Yes   Other Topics Concern  . Not on file   Social History Narrative    Past Surgical History  Procedure Laterality Date  . Tubal ligation    . Cardiac ct  9/12    normal     Family History  Problem Relation Age of Onset  .  Hypertension Mother   . Cancer Father     unknown type  . Colon cancer Neg Hx   . Esophageal cancer Neg Hx   . Rectal cancer Neg Hx   . Stomach cancer Neg Hx     No Known Allergies  Current Outpatient Prescriptions on File Prior to Visit  Medication Sig Dispense Refill  . atorvastatin (LIPITOR) 10 MG tablet TAKE 1 TABLET (10 MG TOTAL) BY MOUTH DAILY. 90 tablet 1  . Esomeprazole Magnesium (NEXIUM 24HR PO) Take 22.3 mg by mouth daily.    . hydrochlorothiazide (HYDRODIURIL) 25 MG tablet TAKE 1 TABLET BY MOUTH ONCE DAILY. 90 tablet 1  . Multiple Vitamins-Minerals (CENTRUM PO) Take one by mouth daily     . Naproxen Sodium (ALEVE PO) Take 1 tablet by mouth as needed.    . sertraline (ZOLOFT) 50 MG tablet TAKE 1 TABLET EVERY DAY 90 tablet 3  . triamcinolone cream (KENALOG) 0.5 % Apply 1 application topically 2 (two) times daily. 30 g 0  . [DISCONTINUED] ranitidine (ZANTAC) 150 MG capsule Take 1 capsule (150 mg total) by mouth 2 (two) times daily. For 3 months 60 capsule 2   No current facility-administered medications on file prior to visit.    BP 110/60 mmHg  Pulse 85  Temp(Src) 98.2 F (36.8 C) (Tympanic)  Wt 180 lb (81.647 kg)  SpO2 96%    Objective:   Physical Exam  Constitutional: She appears well-nourished.  Cardiovascular: Normal rate.   Pulmonary/Chest: Effort normal and breath sounds normal.  Musculoskeletal: Normal range of motion. She exhibits no edema or tenderness.       Lumbar back: She exhibits normal range of motion and no tenderness.       Right lower leg: She exhibits no tenderness, no swelling and no edema.  Skin: Skin is warm and dry. No erythema.  No swelling, enlargement, tenderness to right calf. Negative homan's sign.          Assessment & Plan:  Calf pain:  Present to right lower calf only when lifting her baby granddaughter. Present for 1 week. Exam without erythema, tenderness, swelling. Negative homans sign. Do not suspect DVT. Suspect this  is due to repetitively lifting her grandchild. ER precautions provided regarding redness, swelling, pain, fevers. She may take aleve BID x 1 week. Follow up PRN.

## 2015-08-18 NOTE — Patient Instructions (Addendum)
You may take the Aleve for your pain. Take 1 tablet by mouth twice daily for pain. Stretch your muscles and refrain from lifting heavy objects until you're feeling better.  If you develop swelling, shortness of breath, constant calf pain over the weekend then go to the emergency immediately.  Please notify me if your calf pain does not improve in the next week.  It was a pleasure to see you today!

## 2015-08-18 NOTE — Progress Notes (Signed)
Pre visit review using our clinic review tool, if applicable. No additional management support is needed unless otherwise documented below in the visit note. 

## 2015-10-11 ENCOUNTER — Telehealth: Payer: Self-pay | Admitting: Family Medicine

## 2015-10-11 DIAGNOSIS — I10 Essential (primary) hypertension: Secondary | ICD-10-CM

## 2015-10-11 DIAGNOSIS — D696 Thrombocytopenia, unspecified: Secondary | ICD-10-CM

## 2015-10-11 DIAGNOSIS — E785 Hyperlipidemia, unspecified: Secondary | ICD-10-CM

## 2015-10-11 NOTE — Telephone Encounter (Signed)
-----   Message from Ellamae Sia sent at 10/10/2015  2:42 PM EST ----- Regarding: Lab orders for Thursday, 12.1.16 Patient is scheduled for CPX labs, please order future labs, Thanks , Karna Christmas

## 2015-10-12 ENCOUNTER — Other Ambulatory Visit (INDEPENDENT_AMBULATORY_CARE_PROVIDER_SITE_OTHER): Payer: Medicare PPO

## 2015-10-12 DIAGNOSIS — E785 Hyperlipidemia, unspecified: Secondary | ICD-10-CM

## 2015-10-12 DIAGNOSIS — I10 Essential (primary) hypertension: Secondary | ICD-10-CM | POA: Diagnosis not present

## 2015-10-12 DIAGNOSIS — D696 Thrombocytopenia, unspecified: Secondary | ICD-10-CM

## 2015-10-12 LAB — CBC WITH DIFFERENTIAL/PLATELET
BASOS ABS: 0 10*3/uL (ref 0.0–0.1)
Basophils Relative: 0.4 % (ref 0.0–3.0)
EOS ABS: 0 10*3/uL (ref 0.0–0.7)
Eosinophils Relative: 1.2 % (ref 0.0–5.0)
HEMATOCRIT: 40.9 % (ref 36.0–46.0)
HEMOGLOBIN: 13.5 g/dL (ref 12.0–15.0)
LYMPHS ABS: 1.5 10*3/uL (ref 0.7–4.0)
Lymphocytes Relative: 42.9 % (ref 12.0–46.0)
MCHC: 33.1 g/dL (ref 30.0–36.0)
MCV: 96.5 fl (ref 78.0–100.0)
MONOS PCT: 12.7 % — AB (ref 3.0–12.0)
Monocytes Absolute: 0.4 10*3/uL (ref 0.1–1.0)
NEUTROS ABS: 1.5 10*3/uL (ref 1.4–7.7)
Neutrophils Relative %: 42.8 % — ABNORMAL LOW (ref 43.0–77.0)
Platelets: 99 10*3/uL — ABNORMAL LOW (ref 150.0–400.0)
RBC: 4.24 Mil/uL (ref 3.87–5.11)
RDW: 13.1 % (ref 11.5–15.5)
WBC: 3.5 10*3/uL — ABNORMAL LOW (ref 4.0–10.5)

## 2015-10-12 LAB — COMPREHENSIVE METABOLIC PANEL
ALBUMIN: 3.6 g/dL (ref 3.5–5.2)
ALT: 27 U/L (ref 0–35)
AST: 25 U/L (ref 0–37)
Alkaline Phosphatase: 72 U/L (ref 39–117)
BUN: 15 mg/dL (ref 6–23)
CALCIUM: 10.4 mg/dL (ref 8.4–10.5)
CHLORIDE: 103 meq/L (ref 96–112)
CO2: 33 mEq/L — ABNORMAL HIGH (ref 19–32)
Creatinine, Ser: 0.82 mg/dL (ref 0.40–1.20)
GFR: 89.05 mL/min (ref >60.00)
Glucose, Bld: 104 mg/dL — ABNORMAL HIGH (ref 70–99)
POTASSIUM: 3.9 meq/L (ref 3.5–5.1)
Sodium: 141 mEq/L (ref 135–145)
Total Bilirubin: 0.4 mg/dL (ref 0.2–1.2)
Total Protein: 7.8 g/dL (ref 6.0–8.3)

## 2015-10-12 LAB — LIPID PANEL
CHOLESTEROL: 171 mg/dL (ref 0–200)
HDL: 57.2 mg/dL (ref >39.00)
LDL CALC: 102 mg/dL — AB (ref 0–99)
NonHDL: 113.96
TRIGLYCERIDES: 60 mg/dL (ref 0.0–149.0)
Total CHOL/HDL Ratio: 3
VLDL: 12 mg/dL (ref 0.0–40.0)

## 2015-10-12 LAB — TSH: TSH: 2.84 u[IU]/mL (ref 0.35–4.50)

## 2015-10-18 ENCOUNTER — Other Ambulatory Visit: Payer: Self-pay

## 2015-10-18 ENCOUNTER — Ambulatory Visit (INDEPENDENT_AMBULATORY_CARE_PROVIDER_SITE_OTHER): Payer: Medicare PPO | Admitting: Family Medicine

## 2015-10-18 ENCOUNTER — Encounter: Payer: Self-pay | Admitting: Family Medicine

## 2015-10-18 VITALS — BP 122/70 | HR 56 | Temp 97.8°F | Ht 62.75 in | Wt 178.5 lb

## 2015-10-18 DIAGNOSIS — Z23 Encounter for immunization: Secondary | ICD-10-CM

## 2015-10-18 DIAGNOSIS — E785 Hyperlipidemia, unspecified: Secondary | ICD-10-CM

## 2015-10-18 DIAGNOSIS — Z0000 Encounter for general adult medical examination without abnormal findings: Secondary | ICD-10-CM | POA: Diagnosis not present

## 2015-10-18 DIAGNOSIS — E669 Obesity, unspecified: Secondary | ICD-10-CM | POA: Diagnosis not present

## 2015-10-18 DIAGNOSIS — I10 Essential (primary) hypertension: Secondary | ICD-10-CM

## 2015-10-18 DIAGNOSIS — Z1231 Encounter for screening mammogram for malignant neoplasm of breast: Secondary | ICD-10-CM

## 2015-10-18 DIAGNOSIS — D696 Thrombocytopenia, unspecified: Secondary | ICD-10-CM

## 2015-10-18 MED ORDER — HYDROCHLOROTHIAZIDE 25 MG PO TABS: 25.0000 mg | ORAL_TABLET | Freq: Every day | ORAL | Status: DC

## 2015-10-18 NOTE — Assessment & Plan Note (Signed)
bp in fair control at this time  BP Readings from Last 1 Encounters:  10/18/15 122/70   No changes needed Disc lifstyle change with low sodium diet and exercise  Labs reviewed

## 2015-10-18 NOTE — Assessment & Plan Note (Signed)
Overall stable - was prev monitored by hematology-now we are watching Wbc also stable No bleeding c/o  Will continue to follow

## 2015-10-18 NOTE — Patient Instructions (Signed)
Flu shot today  prevnar shot today  Try to get 1200-1500 mg of calcium per day with at least 1000 iu of vitamin D - for bone health  Take care of yourself  Try to cut back on sugar/fat and calories for weight loss  (weight loss will decrease risk of diabetes) Stay as active as you can be  If you want to see a specialist about hearing loss in the future please let me know

## 2015-10-18 NOTE — Progress Notes (Signed)
Pre visit review using our clinic review tool, if applicable. No additional management support is needed unless otherwise documented below in the visit note. 

## 2015-10-18 NOTE — Assessment & Plan Note (Signed)
Fair  Disc goals for lipids and reasons to control them Rev labs with pt Rev low sat fat diet in detail Counseled on diet - cutting both fats and sugar for trig

## 2015-10-18 NOTE — Assessment & Plan Note (Signed)
Discussed how this problem influences overall health and the risks it imposes  Reviewed plan for weight loss with lower calorie diet (via better food choices and also portion control or program like weight watchers) and exercise building up to or more than 30 minutes 5 days per week including some aerobic activity    

## 2015-10-18 NOTE — Assessment & Plan Note (Signed)
Reviewed health habits including diet and exercise and skin cancer prevention Reviewed appropriate screening tests for age  Also reviewed health mt list, fam hx and immunization status , as well as social and family history   See HPI Labs reviewed Flu shot today  prevnar shot today  Try to get 1200-1500 mg of calcium per day with at least 1000 iu of vitamin D - for bone health  Take care of yourself  Try to cut back on sugar/fat and calories for weight loss  (weight loss will decrease risk of diabetes) Stay as active as you can be  If you want to see a specialist about hearing loss in the future please let me know

## 2015-10-18 NOTE — Progress Notes (Signed)
Subjective:    Patient ID: Kathryn Weaver, female    DOB: Mar 24, 1946, 69 y.o.   MRN: [id number]  HPI Here for annual medicare wellness visit as well as chronic/acute medical problems along with an annual preventative exam  I have personally reviewed the Medicare Annual Wellness questionnaire and have noted 1. The patient's medical and social history 2. Their use of alcohol, tobacco or illicit drugs 3. Their current medications and supplements 4. The patient's functional ability including ADL's, fall risks, home safety risks and hearing or visual             impairment. 5. Diet and physical activities 6. Evidence for depression or mood disorders  The patients weight, height, BMI have been recorded in the chart and visual acuity is per eye clinic.  I have made referrals, counseling and provided education to the patient based review of the above and I have provided the pt with a written personalized care plan for preventive services. Reviewed and updated provider list, see scanned forms.  Has the sniffles today   See scanned forms.  Routine anticipatory guidance given to patient.  See health maintenance. Colon cancer screening 8/13 - had a polyp - recall - ? 5 years  Breast cancer screening mm 3/16 - she has a f/u on Monday  Self breast exam-no lumps  Flu vaccine - wants to start getting them again  Tetanus vaccine 9/09  Pneumovax 4/15 , will get prevnar today  Zoster vaccine-declines  dexa - never had one / she takes ca and D sometimes , no regular exercise except walking when she can  Advance directive-has a living will and power of attorney  Cognitive function addressed- see scanned forms- and if abnormal then additional documentation follows. -no major concerns - just very busy  PMH and [place] reviewed  Meds, vitals, and allergies reviewed.   ROS: See HPI.  Otherwise negative.   Feels good  Taking care of herself  Wt is down 1.5 lb bmi of 31  Is taking care of a 68 year old -  tries to eat right (a lot of driving)  Not a lot of time for self care   She likes chocolate at night -that is her downfall   Hearing dec R ear on screen  She thinks she has never heard well out of that ear  Sometimes ringing  Does not want further eval at this time   bp is stable today  No cp or palpitations or headaches or edema  No side effects to medicines  BP Readings from Last 3 Encounters:  10/18/15 122/70  08/18/15 110/60  06/26/15 112/70      Hyperlipidemia Lab Results  Component Value Date   CHOL 171 10/12/2015   CHOL 160 07/12/2014   CHOL 215* 05/27/2014   Lab Results  Component Value Date   HDL 57.20 10/12/2015   HDL 57.30 07/12/2014   HDL 20.50* 05/27/2014   Lab Results  Component Value Date   LDLCALC 102* 10/12/2015   LDLCALC 88 07/12/2014   LDLCALC 181* 05/27/2014   Lab Results  Component Value Date   TRIG 60.0 10/12/2015   TRIG 76.0 07/12/2014   TRIG 66.0 05/27/2014   Lab Results  Component Value Date   CHOLHDL 3 10/12/2015   CHOLHDL 3 07/12/2014   CHOLHDL 10 05/27/2014   Lab Results  Component Value Date   LDLDIRECT 156.0 04/21/2012   LDLDIRECT 138.9 10/22/2010   LDLDIRECT 163.0 10/20/2009   Statin and diet  More candy than she should eat -she plans to stop that   Hx of low wbc and platelets Followed at [facility] in the past - they signed off and we are monitoring it now  No bleeding problems  Lab Results  Component Value Date   WBC 3.5* 10/12/2015   HGB 13.5 10/12/2015   HCT 40.9 10/12/2015   MCV 96.5 10/12/2015   PLT 99.0* 10/12/2015    Lab Results  Component Value Date   TSH 2.84 10/12/2015      Chemistry      Component Value Date/Time   NA 141 10/12/2015 0808   NA 139 02/21/2012 0925   K 3.9 10/12/2015 0808   K 4.0 02/21/2012 0925   CL 103 10/12/2015 0808   CL 102 02/21/2012 0925   CO2 33* 10/12/2015 0808   CO2 32 02/21/2012 0925   BUN 15 10/12/2015 0808   BUN 11 02/21/2012 0925   CREATININE 0.82 10/12/2015  0808   CREATININE 0.84 02/21/2012 0925      Component Value Date/Time   CALCIUM 10.4 10/12/2015 0808   CALCIUM 10.1 02/21/2012 0925   ALKPHOS 72 10/12/2015 0808   AST 25 10/12/2015 0808   ALT 27 10/12/2015 0808   BILITOT 0.4 10/12/2015 0808        Patient Active Problem List   Diagnosis Date Noted  . Contusion 03/27/2015  . Internal hemorrhoid 04/20/2014  . Iron deficiency 03/16/2014  . Encounter for Medicare annual wellness exam 03/01/2014  . Uterine prolapse 03/01/2014  . Esophageal dysphagia 01/05/2013  . Stress reaction 09/01/2012  . Hyperlipidemia 04/28/2012  . Routine general medical examination at a health care facility 04/20/2012  . Obesity 02/05/2012  . LEUKOCYTOPENIA UNSPECIFIED 10/23/2010  . THROMBOCYTOPENIA 10/24/2009  . TRANSAMINASES, SERUM, ELEVATED 10/24/2009  . POSTMENOPAUSAL STATUS 08/09/2008  . DEPRESSION 08/05/2007  . Essential hypertension 08/05/2007  . GERD 04/20/2007  . DIVERTICULAR DISEASE 04/20/2007  . HX, PERSONAL, COLONIC POLYPS 04/13/2007   Past Medical History  Diagnosis Date  . Hypertension   . Anemia     s/p GI bleed, hospital 5/08  . Adenomatous polyp of colon 03/2007  . GERD (gastroesophageal reflux disease)   . Hiatal hernia     EGD 5/08- stricture, HH, gastritis, GERD  . Depression   . Hypertension   . Endometriosis     uterine US 06/2003  . Diverticulosis   . Iron deficiency 03/16/2014   Past Surgical History  Procedure Laterality Date  . Tubal ligation    . Cardiac ct  9/12    normal    Social History  Substance Use Topics  . Smoking status: Never Smoker   . Smokeless tobacco: Never Used  . Alcohol Use: No   Family History  Problem Relation Age of Onset  . Hypertension Mother   . Cancer Father     unknown type  . Colon cancer Neg Hx   . Esophageal cancer Neg Hx   . Rectal cancer Neg Hx   . Stomach cancer Neg Hx    No Known Allergies Current Outpatient Prescriptions on File Prior to Visit  Medication Sig  Dispense Refill  . atorvastatin (LIPITOR) 10 MG tablet TAKE 1 TABLET (10 MG TOTAL) BY MOUTH DAILY. 90 tablet 1  . Esomeprazole Magnesium (NEXIUM 24HR PO) Take 22.3 mg by mouth daily.    . Multiple Vitamins-Minerals (CENTRUM PO) Take one by mouth daily     . Naproxen Sodium (ALEVE PO) Take 1 tablet by mouth as needed.    Marland Kitchen  sertraline (ZOLOFT) 50 MG tablet TAKE 1 TABLET EVERY DAY 90 tablet 3  . [DISCONTINUED] ranitidine (ZANTAC) 150 MG capsule Take 1 capsule (150 mg total) by mouth 2 (two) times daily. For 3 months 60 capsule 2   No current facility-administered medications on file prior to visit.      Review of Systems Review of Systems  Constitutional: Negative for fever, appetite change, fatigue and unexpected weight change.  ENt pos for rhinorrhea  Eyes: Negative for pain and visual disturbance.  Respiratory: Negative for cough and shortness of breath.   Cardiovascular: Negative for cp or palpitations    Gastrointestinal: Negative for nausea, diarrhea and constipation.  Genitourinary: Negative for urgency and frequency.  Skin: Negative for pallor or rash   Neurological: Negative for weakness, light-headedness, numbness and headaches.  Hematological: Negative for adenopathy. Does not bruise/bleed easily.  Psychiatric/Behavioral: Negative for dysphoric mood. The patient is not nervous/anxious.         Objective:   Physical Exam  Constitutional: She appears well-developed and well-nourished. No distress.  obese and well appearing   HENT:  Head: Normocephalic and atraumatic.  Right Ear: External ear normal.  Left Ear: External ear normal.  Mouth/Throat: Oropharynx is clear and moist.  Eyes: Conjunctivae and EOM are normal. Pupils are equal, round, and reactive to light. No scleral icterus.  Neck: Normal range of motion. Neck supple. No JVD present. Carotid bruit is not present. No thyromegaly present.  Cardiovascular: Normal rate, regular rhythm, normal heart sounds and intact  distal pulses.  Exam reveals no gallop.   Pulmonary/Chest: Effort normal and breath sounds normal. No respiratory distress. She has no wheezes. She exhibits no tenderness.  Abdominal: Soft. Bowel sounds are normal. She exhibits no distension, no abdominal bruit and no mass. There is no tenderness.  Genitourinary: No breast swelling, tenderness, discharge or bleeding.  Breast exam: No mass, nodules, thickening, tenderness, bulging, retraction, inflamation, nipple discharge or skin changes noted.  No axillary or clavicular LA.      Musculoskeletal: Normal range of motion. She exhibits no edema or tenderness.  Lymphadenopathy:    She has no cervical adenopathy.  Neurological: She is alert. She has normal reflexes. No cranial nerve deficit. She exhibits normal muscle tone. Coordination normal.  Skin: Skin is warm and dry. No rash noted. No erythema. No pallor.  Some lentigo and skin tags   Psychiatric: She has a normal mood and affect.          Assessment & Plan:   Problem List Items Addressed This Visit      Cardiovascular and Mediastinum   Essential hypertension - Primary    bp in fair control at this time  BP Readings from Last 1 Encounters:  10/18/15 122/70   No changes needed Disc lifstyle change with low sodium diet and exercise  Labs reviewed       Relevant Medications   hydrochlorothiazide (HYDRODIURIL) 25 MG tablet     Other   Encounter for Medicare annual wellness exam    Reviewed health habits including diet and exercise and skin cancer prevention Reviewed appropriate screening tests for age  Also reviewed health mt list, fam hx and immunization status , as well as social and family history   See HPI Labs reviewed Flu shot today  prevnar shot today  Try to get 1200-1500 mg of calcium per day with at least 1000 iu of vitamin D - for bone health  Take care of yourself  Try to cut back on  sugar/fat and calories for weight loss  (weight loss will decrease risk of  diabetes) Stay as active as you can be  If you want to see a specialist about hearing loss in the future please let me know       Hyperlipidemia    Fair  Disc goals for lipids and reasons to control them Rev labs with pt Rev low sat fat diet in detail Counseled on diet - cutting both fats and sugar for trig       Relevant Medications   hydrochlorothiazide (HYDRODIURIL) 25 MG tablet   Obesity    Discussed how this problem influences overall health and the risks it imposes  Reviewed plan for weight loss with lower calorie diet (via better food choices and also portion control or program like weight watchers) and exercise building up to or more than 30 minutes 5 days per week including some aerobic activity         Routine general medical examination at a health care facility    Reviewed health habits including diet and exercise and skin cancer prevention Reviewed appropriate screening tests for age  Also reviewed health mt list, fam hx and immunization status , as well as social and family history   See HPI Labs reviewed Flu shot today  prevnar shot today  Try to get 1200-1500 mg of calcium per day with at least 1000 iu of vitamin D - for bone health  Take care of yourself  Try to cut back on sugar/fat and calories for weight loss  (weight loss will decrease risk of diabetes) Stay as active as you can be  If you want to see a specialist about hearing loss in the future please let me know       THROMBOCYTOPENIA    Overall stable - was prev monitored by hematology-now we are watching Wbc also stable No bleeding c/o  Will continue to follow        Other Visit Diagnoses    Need for vaccination with 13-polyvalent pneumococcal conjugate vaccine        Relevant Orders    Pneumococcal conjugate vaccine 13-valent (Completed)    Need for influenza vaccination        Relevant Orders    Flu Vaccine QUAD 36+ mos PF IM (Fluarix & Fluzone Quad PF) (Completed)

## 2016-01-15 ENCOUNTER — Ambulatory Visit
Admission: RE | Admit: 2016-01-15 | Discharge: 2016-01-15 | Disposition: A | Discharge status: Home / Self Care | Payer: Medicare Other | Source: Ambulatory Visit

## 2016-01-15 DIAGNOSIS — Z1231 Encounter for screening mammogram for malignant neoplasm of breast: Secondary | ICD-10-CM

## 2016-01-15 LAB — HM MAMMOGRAPHY: HM Mammogram: NORMAL

## 2016-01-17 ENCOUNTER — Encounter: Payer: Self-pay | Admitting: *Deleted

## 2016-03-19 ENCOUNTER — Encounter (HOSPITAL_COMMUNITY): Payer: Self-pay | Admitting: Emergency Medicine

## 2016-03-19 ENCOUNTER — Emergency Department (HOSPITAL_COMMUNITY)
Admission: EM | Admit: 2016-03-19 | Discharge: 2016-03-19 | Disposition: A | Discharge status: Home / Self Care | Payer: Medicare Other | Attending: Emergency Medicine | Admitting: Emergency Medicine

## 2016-03-19 DIAGNOSIS — R1030 Lower abdominal pain, unspecified: Secondary | ICD-10-CM

## 2016-03-19 DIAGNOSIS — D696 Thrombocytopenia, unspecified: Secondary | ICD-10-CM | POA: Insufficient documentation

## 2016-03-19 DIAGNOSIS — K5792 Diverticulitis of intestine, part unspecified, without perforation or abscess without bleeding: Secondary | ICD-10-CM

## 2016-03-19 DIAGNOSIS — E876 Hypokalemia: Secondary | ICD-10-CM

## 2016-03-19 DIAGNOSIS — K5790 Diverticulosis of intestine, part unspecified, without perforation or abscess without bleeding: Secondary | ICD-10-CM | POA: Diagnosis not present

## 2016-03-19 DIAGNOSIS — I10 Essential (primary) hypertension: Secondary | ICD-10-CM | POA: Diagnosis not present

## 2016-03-19 DIAGNOSIS — R1032 Left lower quadrant pain: Secondary | ICD-10-CM

## 2016-03-19 DIAGNOSIS — F329 Major depressive disorder, single episode, unspecified: Secondary | ICD-10-CM | POA: Diagnosis not present

## 2016-03-19 DIAGNOSIS — K219 Gastro-esophageal reflux disease without esophagitis: Secondary | ICD-10-CM | POA: Insufficient documentation

## 2016-03-19 DIAGNOSIS — Z79899 Other long term (current) drug therapy: Secondary | ICD-10-CM | POA: Insufficient documentation

## 2016-03-19 DIAGNOSIS — Z791 Long term (current) use of non-steroidal anti-inflammatories (NSAID): Secondary | ICD-10-CM | POA: Insufficient documentation

## 2016-03-19 LAB — CBC WITH DIFFERENTIAL/PLATELET
BASOS ABS: 0 10*3/uL (ref 0.0–0.1)
Basophils Relative: 0 %
EOS PCT: 0 %
Eosinophils Absolute: 0 10*3/uL (ref 0.0–0.7)
HEMATOCRIT: 39.2 % (ref 36.0–46.0)
HEMOGLOBIN: 12.8 g/dL (ref 12.0–15.0)
LYMPHS PCT: 7 %
Lymphs Abs: 0.6 10*3/uL — ABNORMAL LOW (ref 0.7–4.0)
MCH: 31.3 pg (ref 26.0–34.0)
MCHC: 32.7 g/dL (ref 30.0–36.0)
MCV: 95.8 fL (ref 78.0–100.0)
MONOS PCT: 8 %
Monocytes Absolute: 0.7 10*3/uL (ref 0.1–1.0)
Neutro Abs: 7.1 10*3/uL (ref 1.7–7.7)
Neutrophils Relative %: 85 %
Platelets: 96 10*3/uL — ABNORMAL LOW (ref 150–400)
RBC: 4.09 MIL/uL (ref 3.87–5.11)
RDW: 12.6 % (ref 11.5–15.5)
WBC: 8.4 10*3/uL (ref 4.0–10.5)

## 2016-03-19 LAB — URINALYSIS, ROUTINE W REFLEX MICROSCOPIC
GLUCOSE, UA: NEGATIVE mg/dL
Ketones, ur: 15 mg/dL — AB
LEUKOCYTES UA: NEGATIVE
Nitrite: NEGATIVE
PROTEIN: NEGATIVE mg/dL
SPECIFIC GRAVITY, URINE: 1.03 (ref 1.005–1.030)
pH: 5 (ref 5.0–8.0)

## 2016-03-19 LAB — COMPREHENSIVE METABOLIC PANEL
ALBUMIN: 3.7 g/dL (ref 3.5–5.0)
ALT: 35 U/L (ref 14–54)
ANION GAP: 10 (ref 5–15)
AST: 39 U/L (ref 15–41)
Alkaline Phosphatase: 83 U/L (ref 38–126)
BILIRUBIN TOTAL: 1.1 mg/dL (ref 0.3–1.2)
BUN: 13 mg/dL (ref 6–20)
CHLORIDE: 101 mmol/L (ref 101–111)
CO2: 27 mmol/L (ref 22–32)
Calcium: 10.1 mg/dL (ref 8.9–10.3)
Creatinine, Ser: 0.66 mg/dL (ref 0.44–1.00)
GFR calc Af Amer: >60 mL/min (ref >60)
GFR calc non Af Amer: >60 mL/min (ref >60)
GLUCOSE: 128 mg/dL — AB (ref 65–99)
POTASSIUM: 3.4 mmol/L — AB (ref 3.5–5.1)
Sodium: 138 mmol/L (ref 135–145)
TOTAL PROTEIN: 8 g/dL (ref 6.5–8.1)

## 2016-03-19 LAB — URINE MICROSCOPIC-ADD ON

## 2016-03-19 LAB — LIPASE, BLOOD: LIPASE: 46 U/L (ref 11–51)

## 2016-03-19 MED ORDER — METRONIDAZOLE 500 MG PO TABS: 500.0000 mg | ORAL_TABLET | Freq: Three times a day (TID) | ORAL | Status: DC

## 2016-03-19 MED ORDER — CIPROFLOXACIN HCL 500 MG PO TABS: 500.0000 mg | ORAL_TABLET | Freq: Two times a day (BID) | ORAL | Status: DC

## 2016-03-19 NOTE — Discharge Instructions (Signed)
Your abdominal pain could be due to diverticulitis. Take flagyl and ciprofloxacin as directed. Please take all of your antibiotics until finished!   You may develop diarrhea or other abdominal discomfort from the antibiotics.  You may help offset this with probiotics which you can buy or get in yogurt. Do not eat  or take the probiotics until 2 hours after your antibiotic. Stay well hydrated. You can consider taking over the counter tums or pepto bismol as needed for additional relief of symptoms. You can use tylenol and motrin as needed for pain. Your potassium was slightly low today, follow the list of foods below to help replenish the potassium in your diet. Follow up with your regular doctor in 1 week for recheck of symptoms. Return to the ER for changes or worsening symptoms.  Abdominal (belly) pain can be caused by many things. Your caregiver performed an examination and possibly ordered blood/urine tests and imaging (CT scan, x-rays, ultrasound). Many cases can be observed and treated at home after initial evaluation in the emergency department. Even though you are being discharged home, abdominal pain can be unpredictable. Therefore, you need a repeated exam if your pain does not resolve, returns, or worsens. Most patients with abdominal pain don't have to be admitted to the hospital or have surgery, but serious problems like appendicitis and gallbladder attacks can start out as nonspecific pain. Many abdominal conditions cannot be diagnosed in one visit, so follow-up evaluations are very important. SEEK IMMEDIATE MEDICAL ATTENTION IF YOU DEVELOP ANY OF THE FOLLOWING SYMPTOMS:  The pain does not go away or becomes severe.   A temperature above 101 develops.   Repeated vomiting occurs (multiple episodes).   The pain becomes localized to portions of the abdomen. The right side could possibly be appendicitis. In an adult, the left lower portion of the abdomen could be colitis or diverticulitis.    Blood is being passed in stools or vomit (bright red or black tarry stools).   Return also if you develop chest pain, difficulty breathing, dizziness or fainting, or become confused, poorly responsive, or inconsolable (young children).  The constipation stays for more than 4 days.   There is belly (abdominal) or rectal pain.   You do not seem to be getting better.    Abdominal Pain, Adult Many things can cause belly (abdominal) pain. Most times, the belly pain is not dangerous. Many cases of belly pain can be watched and treated at home. HOME CARE   Do not take medicines that help you go poop (laxatives) unless told to by your doctor.  Only take medicine as told by your doctor.  Eat or drink as told by your doctor. Your doctor will tell you if you should be on a special diet. GET HELP IF:  You do not know what is causing your belly pain.  You have belly pain while you are sick to your stomach (nauseous) or have runny poop (diarrhea).  You have pain while you pee or poop.  Your belly pain wakes you up at night.  You have belly pain that gets worse or better when you eat.  You have belly pain that gets worse when you eat fatty foods.  You have a fever. GET HELP RIGHT AWAY IF:   The pain does not go away within 2 hours.  You keep throwing up (vomiting).  The pain changes and is only in the right or left part of the belly.  You have bloody or tarry looking poop.  MAKE SURE YOU:   Understand these instructions.  Will watch your condition.  Will get help right away if you are not doing well or get worse.   This information is not intended to replace advice given to you by your health care provider. Make sure you discuss any questions you have with your health care provider.   Document Released: 04/15/2008 Document Revised: 11/18/2014 Document Reviewed: 07/07/2013 Elsevier Interactive Patient Education 2016 [employer].  Diverticulitis Diverticulitis is  inflammation or infection of small pouches in your colon that form when you have a condition called diverticulosis. The pouches in your colon are called diverticula. Your colon, or large intestine, is where water is absorbed and stool is formed. Complications of diverticulitis can include:  Bleeding.  Severe infection.  Severe pain.  Perforation of your colon.  Obstruction of your colon. CAUSES  Diverticulitis is caused by bacteria. Diverticulitis happens when stool becomes trapped in diverticula. This allows bacteria to grow in the diverticula, which can lead to inflammation and infection. RISK FACTORS People with diverticulosis are at risk for diverticulitis. Eating a diet that does not include enough fiber from fruits and vegetables may make diverticulitis more likely to develop. SYMPTOMS  Symptoms of diverticulitis may include:  Abdominal pain and tenderness. The pain is normally located on the left side of the abdomen, but may occur in other areas.  Fever and chills.  Bloating.  Cramping.  Nausea.  Vomiting.  Constipation.  Diarrhea.  Blood in your stool. DIAGNOSIS  Your health care provider will ask you about your medical history and do a physical exam. You may need to have tests done because many medical conditions can cause the same symptoms as diverticulitis. Tests may include:  Blood tests.  Urine tests.  Imaging tests of the abdomen, including X-rays and CT scans. When your condition is under control, your health care provider may recommend that you have a colonoscopy. A colonoscopy can show how severe your diverticula are and whether something else is causing your symptoms. TREATMENT  Most cases of diverticulitis are mild and can be treated at home. Treatment may include:  Taking over-the-counter pain medicines.  Following a clear liquid diet.  Taking antibiotic medicines by mouth for 7-10 days. More severe cases may be treated at a hospital.  Treatment may include:  Not eating or drinking.  Taking prescription pain medicine.  Receiving antibiotic medicines through an IV tube.  Receiving fluids and nutrition through an IV tube.  Surgery. HOME CARE INSTRUCTIONS   Follow your health care provider's instructions carefully.  Follow a full liquid diet or other diet as directed by your health care provider. After your symptoms improve, your health care provider may tell you to change your diet. He or she may recommend you eat a high-fiber diet. Fruits and vegetables are good sources of fiber. Fiber makes it easier to pass stool.  Take fiber supplements or probiotics as directed by your health care provider.  Only take medicines as directed by your health care provider.  Keep all your follow-up appointments. SEEK MEDICAL CARE IF:   Your pain does not improve.  You have a hard time eating food.  Your bowel movements do not return to normal. SEEK IMMEDIATE MEDICAL CARE IF:   Your pain becomes worse.  Your symptoms do not get better.  Your symptoms suddenly get worse.  You have a fever.  You have repeated vomiting.  You have bloody or black, tarry stools. MAKE SURE YOU:   Understand these  instructions.  Will watch your condition.  Will get help right away if you are not doing well or get worse.   This information is not intended to replace advice given to you by your health care provider. Make sure you discuss any questions you have with your health care provider.   Document Released: 08/07/2005 Document Revised: 11/02/2013 Document Reviewed: 09/22/2013 Elsevier Interactive Patient Education 2016 [place].  Potassium Content of Foods Potassium is a mineral found in many foods and drinks. It helps keep fluids and minerals balanced in your body and affects how steadily your heart beats. Potassium also helps control your blood pressure and keep your muscles and nervous system healthy. Certain health  conditions and medicines may change the balance of potassium in your body. When this happens, you can help balance your level of potassium through the foods that you do or do not eat. Your health care provider or dietitian may recommend an amount of potassium that you should have each day. The following lists of foods provide the amount of potassium (in parentheses) per serving in each item. HIGH IN POTASSIUM  The following foods and beverages have 200 mg or more of potassium per serving:  Apricots, 2 raw or 5 dry (200 mg).  Artichoke, 1 medium (345 mg).  Avocado, raw,  each (245 mg).  Banana, 1 medium (425 mg).  Beans, lima, or baked beans, canned,  cup (280 mg).  Beans, white, canned,  cup (595 mg).  Beef roast, 3 oz (320 mg).  Beef, ground, 3 oz (270 mg).  Beets, raw or cooked,  cup (260 mg).  Bran muffin, 2 oz (300 mg).  Broccoli,  cup (230 mg).  Brussels sprouts,  cup (250 mg).  Cantaloupe,  cup (215 mg).  Cereal, 100% bran,  cup (200-400 mg).  Cheeseburger, single, fast food, 1 each (225-400 mg).  Chicken, 3 oz (220 mg).  Clams, canned, 3 oz (535 mg).  Crab, 3 oz (225 mg).  Dates, 5 each (270 mg).  Dried beans and peas,  cup (300-475 mg).  Figs, dried, 2 each (260 mg).  Fish: halibut, tuna, cod, snapper, 3 oz (480 mg).  Fish: salmon, haddock, swordfish, perch, 3 oz (300 mg).  Fish, tuna, canned 3 oz (200 mg).  [place] fries, fast food, 3 oz (470 mg).  Granola with fruit and nuts,  cup (200 mg).  Grapefruit juice,  cup (200 mg).  Greens, beet,  cup (655 mg).  Honeydew melon,  cup (200 mg).  Kale, raw, 1 cup (300 mg).  Kiwi, 1 medium (240 mg).  Kohlrabi, rutabaga, parsnips,  cup (280 mg).  Lentils,  cup (365 mg).  Mango, 1 each (325 mg).  Milk, chocolate, 1 cup (420 mg).  Milk: nonfat, low-fat, whole, buttermilk, 1 cup (350-380 mg).  Molasses, 1 Tbsp (295 mg).  Mushrooms,  cup (280) mg.  Nectarine, 1 each (275  mg).  Nuts: almonds, peanuts, hazelnuts, [place], cashew, mixed, 1 oz (200 mg).  Nuts, pistachios, 1 oz (295 mg).  Orange, 1 each (240 mg).  Orange juice,  cup (235 mg).  Papaya, medium,  fruit (390 mg).  Peanut butter, chunky, 2 Tbsp (240 mg).  Peanut butter, smooth, 2 Tbsp (210 mg).  Pear, 1 medium (200 mg).  Pomegranate, 1 whole (400 mg).  Pomegranate juice,  cup (215 mg).  Pork, 3 oz (350 mg).  Potato chips, salted, 1 oz (465 mg).  Potato, baked with skin, 1 medium (925 mg).  Potatoes, boiled,  cup (255  mg).  Potatoes, mashed,  cup (330 mg).  Prune juice,  cup (370 mg).  Prunes, 5 each (305 mg).  Pudding, chocolate,  cup (230 mg).  Pumpkin, canned,  cup (250 mg).  Raisins, seedless,  cup (270 mg).  Seeds, sunflower or pumpkin, 1 oz (240 mg).  Soy milk, 1 cup (300 mg).  Spinach,  cup (420 mg).  Spinach, canned,  cup (370 mg).  Sweet potato, baked with skin, 1 medium (450 mg).  Swiss chard,  cup (480 mg).  Tomato or vegetable juice,  cup (275 mg).  Tomato sauce or puree,  cup (400-550 mg).  Tomato, raw, 1 medium (290 mg).  Tomatoes, canned,  cup (200-300 mg).  [place], 3 oz (250 mg).  Wheat germ, 1 oz (250 mg).  Winter squash,  cup (250 mg).  Yogurt, plain or fruited, 6 oz (260-435 mg).  Zucchini,  cup (220 mg). MODERATE IN POTASSIUM The following foods and beverages have 50-200 mg of potassium per serving:  Apple, 1 each (150 mg).  Apple juice,  cup (150 mg).  Applesauce,  cup (90 mg).  Apricot nectar,  cup (140 mg).  Asparagus, small spears,  cup or 6 spears (155 mg).  Bagel, cinnamon raisin, 1 each (130 mg).  Bagel, egg or plain, 4 in., 1 each (70 mg).  Beans, green,  cup (90 mg).  Beans, yellow,  cup (190 mg).  Beer, regular, 12 oz (100 mg).  Beets, canned,  cup (125 mg).  Blackberries,  cup (115 mg).  Blueberries,  cup (60 mg).  Bread, whole wheat, 1 slice (70 mg).  Broccoli, raw,   cup (145 mg).  Cabbage,  cup (150 mg).  Carrots, cooked or raw,  cup (180 mg).  Cauliflower, raw,  cup (150 mg).  Celery, raw,  cup (155 mg).  Cereal, bran flakes, cup (120-150 mg).  Cheese, cottage,  cup (110 mg).  Cherries, 10 each (150 mg).  Chocolate, 1 oz bar (165 mg).  Coffee, brewed 6 oz (90 mg).  Corn,  cup or 1 ear (195 mg).  Cucumbers,  cup (80 mg).  Egg, large, 1 each (60 mg).  Eggplant,  cup (60 mg).  Endive, raw, cup (80 mg).  English muffin, 1 each (65 mg).  Fish, orange roughy, 3 oz (150 mg).  Frankfurter, beef or pork, 1 each (75 mg).  Fruit cocktail,  cup (115 mg).  Grape juice,  cup (170 mg).  Grapefruit,  fruit (175 mg).  Grapes,  cup (155 mg).  Greens: kale, turnip, collard,  cup (110-150 mg).  Ice cream or frozen yogurt, chocolate,  cup (175 mg).  Ice cream or frozen yogurt, vanilla,  cup (120-150 mg).  Lemons, limes, 1 each (80 mg).  Lettuce, all types, 1 cup (100 mg).  Mixed vegetables,  cup (150 mg).  Mushrooms, raw,  cup (110 mg).  Nuts: walnuts, pecans, or macadamia, 1 oz (125 mg).  Oatmeal,  cup (80 mg).  Okra,  cup (110 mg).  Onions, raw,  cup (120 mg).  Peach, 1 each (185 mg).  Peaches, canned,  cup (120 mg).  Pears, canned,  cup (120 mg).  Peas, green, frozen,  cup (90 mg).  Peppers, green,  cup (130 mg).  Peppers, red,  cup (160 mg).  Pineapple juice,  cup (165 mg).  Pineapple, fresh or canned,  cup (100 mg).  Plums, 1 each (105 mg).  Pudding, vanilla,  cup (150 mg).  Raspberries,  cup (90 mg).  Rhubarb,  cup (115  mg).  Rice, wild,  cup (80 mg).  Shrimp, 3 oz (155 mg).  Spinach, raw, 1 cup (170 mg).  Strawberries,  cup (125 mg).  Summer squash  cup (175-200 mg).  Swiss chard, raw, 1 cup (135 mg).  Tangerines, 1 each (140 mg).  Tea, brewed, 6 oz (65 mg).  Turnips,  cup (140 mg).  Watermelon,  cup (85 mg).  Wine, red, table, 5 oz (180  mg).  Wine, white, table, 5 oz (100 mg). LOW IN POTASSIUM The following foods and beverages have less than 50 mg of potassium per serving.  Bread, white, 1 slice (30 mg).  Carbonated beverages, 12 oz (less than 5 mg).  Cheese, 1 oz (20-30 mg).  Cranberries,  cup (45 mg).  Cranberry juice cocktail,  cup (20 mg).  Fats and oils, 1 Tbsp (less than 5 mg).  Hummus, 1 Tbsp (32 mg).  Nectar: papaya, mango, or pear,  cup (35 mg).  Rice, white or brown,  cup (50 mg).  Spaghetti or macaroni,  cup cooked (30 mg).  Tortilla, flour or corn, 1 each (50 mg).  Waffle, 4 in., 1 each (50 mg).  Water chestnuts,  cup (40 mg).   This information is not intended to replace advice given to you by your health care provider. Make sure you discuss any questions you have with your health care provider.   Document Released: 06/11/2005 Document Revised: 11/02/2013 Document Reviewed: 09/24/2013 Elsevier Interactive Patient Education [employer].

## 2016-03-19 NOTE — ED Notes (Signed)
Pt c/o "gassy, bubbly" medial mid abdominal pain and nausea x 1 week, no emesis or diarrhea. Some dry heaving.

## 2016-03-19 NOTE — ED Provider Notes (Signed)
CSN: [id number]     Arrival date & time 03/19/16  1503 History   First MD Initiated Contact with Patient 03/19/16 1622     Chief Complaint  Patient presents with  . Abdominal Pain     (Consider location/radiation/quality/duration/timing/severity/associated sxs/prior Treatment) HPI Comments: Kathryn Weaver is a 70 y.o. female with a PMHx of HTN, anemia, chronic thrombocytopenia, GERD, hiatal hernia, gastritis, depression, diverticulosis, endometriosis, and iron deficiency, with a PSHx of tubal ligation, who presents to the ED with complaints of gradual onset lower abdominal pain that has been intermittent 1 week. She reports that the pain is currently resolved, but when it occurs it is 8/10 sharp pain across the lower abdomen, intermittent, nonradiating, with no known aggravating factors, and unrelieved with Aleve. Initially she had nausea when it started and had one episode of nonbloody diarrhea, but this resolved and has not since recurred over the last week. She states that the pain feels "like she's gassy".  She denies any fevers, chills, chest pain, shortness breath, nausea, vomiting, diarrhea, consultation, melena, hematochezia, obstipation, dysuria, hematuria, numbness, tingling, weakness, recent travel, sick contacts, suspicious food intake, antibiotic use, alcohol use, or chronic NSAID use. She last had a colonoscopy in 2013 and had benign polyps per the chart review from her PCP visits.   Patient is a 70 y.o. female presenting with abdominal pain. The history is provided by the patient and medical records. No language interpreter was used.  Abdominal Pain Pain location:  LLQ, suprapubic and RLQ Pain quality: sharp   Pain quality comment:  "gassy" Pain radiates to:  Does not radiate Pain severity:  Moderate Onset quality:  Gradual Duration:  1 week Timing:  Intermittent Progression:  Partially resolved Chronicity:  New Context: not recent travel, not sick contacts and not suspicious  food intake   Relieved by:  Nothing Worsened by:  Nothing tried Ineffective treatments:  NSAIDs Associated symptoms: no chest pain, no chills, no constipation, no diarrhea (none in 1wk), no dysuria, no fever, no flatus, no hematemesis, no hematochezia, no hematuria, no melena, no nausea (none in 1wk), no shortness of breath and no vomiting   Risk factors: no alcohol abuse, has not had multiple surgeries and no NSAID use     Past Medical History  Diagnosis Date  . Hypertension   . Anemia     s/p GI bleed, hospital 5/08  . Adenomatous polyp of colon 03/2007  . GERD (gastroesophageal reflux disease)   . Hiatal hernia     EGD 5/08- stricture, HH, gastritis, GERD  . Depression   . Hypertension   . Endometriosis     uterine US 06/2003  . Diverticulosis   . Iron deficiency 03/16/2014   Past Surgical History  Procedure Laterality Date  . Tubal ligation    . Cardiac ct  9/12    normal    Family History  Problem Relation Age of Onset  . Hypertension Mother   . Cancer Father     unknown type  . Colon cancer Neg Hx   . Esophageal cancer Neg Hx   . Rectal cancer Neg Hx   . Stomach cancer Neg Hx    Social History  Substance Use Topics  . Smoking status: Never Smoker   . Smokeless tobacco: Never Used  . Alcohol Use: No   OB History    No data available     Review of Systems  Constitutional: Negative for fever and chills.  Respiratory: Negative for shortness of breath.  Cardiovascular: Negative for chest pain.  Gastrointestinal: Positive for abdominal pain. Negative for nausea (none in 1wk), vomiting, diarrhea (none in 1wk), constipation, blood in stool, melena, hematochezia, anal bleeding, flatus and hematemesis.  Genitourinary: Negative for dysuria and hematuria.  Musculoskeletal: Negative for myalgias and arthralgias.  Skin: Negative for color change.  Allergic/Immunologic: Negative for immunocompromised state.  Neurological: Negative for weakness and numbness.    Psychiatric/Behavioral: Negative for confusion.   10 Systems reviewed and are negative for acute change except as noted in the HPI.    Allergies  Review of patient's allergies indicates no known allergies.  Home Medications   Prior to Admission medications   Medication Sig Start Date End Date Taking? Authorizing Provider  atorvastatin (LIPITOR) 10 MG tablet TAKE 1 TABLET (10 MG TOTAL) BY MOUTH DAILY. 08/02/15   Abner Greenspan, MD  Esomeprazole Magnesium (NEXIUM 24HR PO) Take 22.3 mg by mouth daily.    Historical Provider, MD  hydrochlorothiazide (HYDRODIURIL) 25 MG tablet Take 1 tablet (25 mg total) by mouth daily. 10/18/15   Abner Greenspan, MD  Multiple Vitamins-Minerals (CENTRUM PO) Take one by mouth daily     Historical Provider, MD  Naproxen Sodium (ALEVE PO) Take 1 tablet by mouth as needed.    Historical Provider, MD  sertraline (ZOLOFT) 50 MG tablet TAKE 1 TABLET EVERY DAY 03/27/15   [name] Tower, MD   BP 135/72 mmHg  Pulse 73  Temp(Src) 98.5 F (36.9 C) (Oral)  Resp 16  SpO2 100% Physical Exam  Constitutional: She is oriented to person, place, and time. Vital signs are normal. She appears well-developed and well-nourished.  Non-toxic appearance. No distress.  Afebrile, nontoxic, NAD  HENT:  Head: Normocephalic and atraumatic.  Mouth/Throat: Oropharynx is clear and moist and mucous membranes are normal.  Eyes: Conjunctivae and EOM are normal. Right eye exhibits no discharge. Left eye exhibits no discharge.  Neck: Normal range of motion. Neck supple.  Cardiovascular: Normal rate, regular rhythm, normal heart sounds and intact distal pulses.  Exam reveals no gallop and no friction rub.   No murmur heard. Pulmonary/Chest: Effort normal and breath sounds normal. No respiratory distress. She has no decreased breath sounds. She has no wheezes. She has no rhonchi. She has no rales.  Abdominal: Soft. Normal appearance and bowel sounds are normal. She exhibits no distension. There is  tenderness in the suprapubic area and left lower quadrant. There is no rigidity, no rebound, no guarding, no CVA tenderness, no tenderness at McBurney's point and negative Murphy's sign.    Soft, nondistended, +BS throughout, with mild suprapubic/LLQ TTP, no r/g/r, neg murphy's, neg mcburney's, no CVA TTP   Musculoskeletal: Normal range of motion.  Neurological: She is alert and oriented to person, place, and time. She has normal strength. No sensory deficit.  Skin: Skin is warm, dry and intact. No rash noted.  Psychiatric: She has a normal mood and affect.  Nursing note and vitals reviewed.   ED Course  Procedures (including critical care time) Labs Review Labs Reviewed  COMPREHENSIVE METABOLIC PANEL - Abnormal; Notable for the following:    Potassium 3.4 (*)    Glucose, Bld 128 (*)    All other components within normal limits  URINALYSIS, ROUTINE W REFLEX MICROSCOPIC (NOT AT [facility]) - Abnormal; Notable for the following:    APPearance CLOUDY (*)    Hgb urine dipstick TRACE (*)    Bilirubin Urine SMALL (*)    Ketones, ur 15 (*)    All other  components within normal limits  CBC WITH DIFFERENTIAL/PLATELET - Abnormal; Notable for the following:    Platelets 96 (*)    Lymphs Abs 0.6 (*)    All other components within normal limits  URINE MICROSCOPIC-ADD ON - Abnormal; Notable for the following:    Squamous Epithelial / LPF 6-30 (*)    Bacteria, UA FEW (*)    Casts HYALINE CASTS (*)    All other components within normal limits  LIPASE, BLOOD    Imaging Review No results found. I have personally reviewed and evaluated these images and lab results as part of my medical decision-making.   EKG Interpretation None      MDM   Final diagnoses:  Colicky LLQ abdominal pain  Lower abdominal pain  Diverticulitis of intestine without perforation or abscess without bleeding  Thrombocytopenia (HCC)  Hypokalemia    70 y.o. female here with sharp lower abd pain x1wk, had nausea and  1 episode of diarrhea at onset of symptoms but these both resolved, and the intermittent abd pain persisted. On exam, tenderness in suprapubic area and LLQ, nonperitoneal. VSS. Discussed that this could be diverticulitis, although exam is not tremendously concerning for peritonitis that would make me suspicious for abscess/perf/etc. Discussed that we could proceed with labs first, and if any concerning findings show up then maybe consider CT imaging, but if labs unremarkable could treat for diverticulitis since clinically that seems consistent. Pt agrees with this plan of action. Declines wanting anything for pain since pain is resolved unless someone palpates her abdomen. Will monitor and reassess shortly  7:25 PM Lipase WNL, CMP with mildly low K 3.4 but doubt need for repletion will discuss diet changes to help replenish this, CMP otherwise WNL, CBC with plt 96 which is chronic and stable, otherwise no other abnormalities, no concerning leukocytosis, doubt need for further imaging/work up. U/A with nitrite and leuk neg, 6-30 squamous cells, 0-5 WBC and few bacteria, likely contaminated, doubt UTI. Will likely treat as diverticulitis just based on clinical findings of LLQ colicky pain and the change in bowels last week upon onset, will start on cipro/flagyl x1wk. F/up with PCP in 1wk. Tylenol/motrin for pain. I explained the diagnosis and have given explicit precautions to return to the ER including for any other new or worsening symptoms. The patient understands and accepts the medical plan as it's been dictated and I have answered their questions. Discharge instructions concerning home care and prescriptions have been given. The patient is STABLE and is discharged to home in good condition.  BP 111/91 mmHg  Pulse 68  Temp(Src) 98.5 F (36.9 C) (Oral)  Resp 15  SpO2 100%  Meds ordered this encounter  Medications  . ciprofloxacin (CIPRO) 500 MG tablet    Sig: Take 1 tablet (500 mg total) by mouth 2  (two) times daily. One po bid x 7 days    Dispense:  14 tablet    Refill:  0    Order Specific Question:  Supervising Provider    Answer:  Sabra Heck, BRIAN [[id number]]  . metroNIDAZOLE (FLAGYL) 500 MG tablet    Sig: Take 1 tablet (500 mg total) by mouth 3 (three) times daily. One po tid x 7 days    Dispense:  21 tablet    Refill:  0    Order Specific Question:  Supervising Provider    Answer:  Noemi Chapel [[id number]]     Judith Campillo Camprubi-Soms, PA-C 03/19/16 [place], MD 03/20/16 1651

## 2016-03-20 ENCOUNTER — Ambulatory Visit: Payer: Medicare Other | Admitting: Internal Medicine

## 2016-03-21 ENCOUNTER — Ambulatory Visit: Payer: Medicare Other | Admitting: Internal Medicine

## 2016-04-05 ENCOUNTER — Encounter: Payer: Self-pay | Admitting: Family Medicine

## 2016-04-05 ENCOUNTER — Ambulatory Visit (INDEPENDENT_AMBULATORY_CARE_PROVIDER_SITE_OTHER): Payer: Medicare Other | Admitting: Family Medicine

## 2016-04-05 VITALS — BP 122/78 | HR 51 | Temp 97.8°F | Ht 62.75 in | Wt 179.2 lb

## 2016-04-05 DIAGNOSIS — R5383 Other fatigue: Secondary | ICD-10-CM | POA: Insufficient documentation

## 2016-04-05 DIAGNOSIS — R5382 Chronic fatigue, unspecified: Secondary | ICD-10-CM

## 2016-04-05 DIAGNOSIS — E669 Obesity, unspecified: Secondary | ICD-10-CM | POA: Diagnosis not present

## 2016-04-05 LAB — COMPREHENSIVE METABOLIC PANEL
ALT: 33 U/L (ref 0–35)
AST: 29 U/L (ref 0–37)
Albumin: 3.8 g/dL (ref 3.5–5.2)
Alkaline Phosphatase: 65 U/L (ref 39–117)
BUN: 9 mg/dL (ref 6–23)
CALCIUM: 10.8 mg/dL — AB (ref 8.4–10.5)
CHLORIDE: 102 meq/L (ref 96–112)
CO2: 31 meq/L (ref 19–32)
CREATININE: 0.75 mg/dL (ref 0.40–1.20)
GFR: 98.57 mL/min (ref >60.00)
Glucose, Bld: 107 mg/dL — ABNORMAL HIGH (ref 70–99)
POTASSIUM: 4.5 meq/L (ref 3.5–5.1)
SODIUM: 138 meq/L (ref 135–145)
Total Bilirubin: 0.6 mg/dL (ref 0.2–1.2)
Total Protein: 7.9 g/dL (ref 6.0–8.3)

## 2016-04-05 LAB — CBC WITH DIFFERENTIAL/PLATELET
BASOS PCT: 0.4 % (ref 0.0–3.0)
Basophils Absolute: 0 10*3/uL (ref 0.0–0.1)
EOS ABS: 0 10*3/uL (ref 0.0–0.7)
EOS PCT: 0.6 % (ref 0.0–5.0)
HEMATOCRIT: 41.3 % (ref 36.0–46.0)
Hemoglobin: 13.5 g/dL (ref 12.0–15.0)
LYMPHS PCT: 46.7 % — AB (ref 12.0–46.0)
Lymphs Abs: 1.6 10*3/uL (ref 0.7–4.0)
MCHC: 32.7 g/dL (ref 30.0–36.0)
MCV: 94.9 fl (ref 78.0–100.0)
MONO ABS: 0.5 10*3/uL (ref 0.1–1.0)
Monocytes Relative: 13.4 % — ABNORMAL HIGH (ref 3.0–12.0)
Neutro Abs: 1.4 10*3/uL (ref 1.4–7.7)
Neutrophils Relative %: 38.9 % — ABNORMAL LOW (ref 43.0–77.0)
RBC: 4.36 Mil/uL (ref 3.87–5.11)
RDW: 13.9 % (ref 11.5–15.5)
WBC: 3.5 10*3/uL — AB (ref 4.0–10.5)

## 2016-04-05 LAB — FERRITIN: Ferritin: 99.1 ng/mL (ref 10.0–291.0)

## 2016-04-05 LAB — TSH: TSH: 1.69 u[IU]/mL (ref 0.35–4.50)

## 2016-04-05 LAB — VITAMIN B12: VITAMIN B 12: 1059 pg/mL — AB (ref 211–911)

## 2016-04-05 NOTE — Patient Instructions (Addendum)
Start exercising at least 30 minutes per day -- start in the mornings if possible  Go for a walk today  You may be tired from lack of exercise  Labs today  Alert [place] if you get a fever or other symptoms of illness

## 2016-04-05 NOTE — Assessment & Plan Note (Signed)
Going on for 2 d in pt who was recently (successfully) tx for diverticulitis with cipro and flagyl  Claims not to snore  Did stop exercising a month ago  No s/s of infection or illness and reassuring exam today Labs for fatigue now  Enc to aim for 30 min exercise daily (work up to it)   If all nl may consider sleep apnea eval?

## 2016-04-05 NOTE — Progress Notes (Signed)
Pre visit review using our clinic review tool, if applicable. No additional management support is needed unless otherwise documented below in the visit note. 

## 2016-04-05 NOTE — Assessment & Plan Note (Signed)
Wt is stable Pt states she is watching diet Also recent dec appetite last 2 d with fatigue  Discussed how this problem influences overall health and the risks it imposes  Reviewed plan for weight loss with lower calorie diet (via better food choices and also portion control or program like weight watchers) and exercise building up to or more than 30 minutes 5 days per week including some aerobic activity

## 2016-04-05 NOTE — Progress Notes (Signed)
Subjective:    Patient ID: Early Chars, female    DOB: 20-Jun-1946, 70 y.o.   MRN: [id number]  HPI Here feeling malaise   Very tired , sleeping and sleeping  Not depressed  Just exhausted  Going on 2 days  No fever or cold symptoms  occ snores-not often  Wakes up fatigued   No hx of sleep apnea    Medicines have not changed   Finished abx for diverticulitis (5/9)- ED visit  abd pain is gone     Chemistry      Component Value Date/Time   NA 138 03/19/2016 1656   NA 139 02/21/2012 0925   K 3.4* 03/19/2016 1656   K 4.0 02/21/2012 0925   CL 101 03/19/2016 1656   CL 102 02/21/2012 0925   CO2 27 03/19/2016 1656   CO2 32 02/21/2012 0925   BUN 13 03/19/2016 1656   BUN 11 02/21/2012 0925   CREATININE 0.66 03/19/2016 1656   CREATININE 0.84 02/21/2012 0925      Component Value Date/Time   CALCIUM 10.1 03/19/2016 1656   CALCIUM 10.1 02/21/2012 0925   ALKPHOS 83 03/19/2016 1656   AST 39 03/19/2016 1656   ALT 35 03/19/2016 1656   BILITOT 1.1 03/19/2016 1656      Lab Results  Component Value Date   WBC 8.4 03/19/2016   HGB 12.8 03/19/2016   HCT 39.2 03/19/2016   MCV 95.8 03/19/2016   PLT 96* 03/19/2016     Patient Active Problem List   Diagnosis Date Noted  . Fatigue 04/05/2016  . Contusion 03/27/2015  . Internal hemorrhoid 04/20/2014  . Iron deficiency 03/16/2014  . Encounter for Medicare annual wellness exam 03/01/2014  . Uterine prolapse 03/01/2014  . Esophageal dysphagia 01/05/2013  . Stress reaction 09/01/2012  . Hyperlipidemia 04/28/2012  . Routine general medical examination at a health care facility 04/20/2012  . Obesity 02/05/2012  . LEUKOCYTOPENIA UNSPECIFIED 10/23/2010  . THROMBOCYTOPENIA 10/24/2009  . TRANSAMINASES, SERUM, ELEVATED 10/24/2009  . POSTMENOPAUSAL STATUS 08/09/2008  . DEPRESSION 08/05/2007  . Essential hypertension 08/05/2007  . GERD 04/20/2007  . DIVERTICULAR DISEASE 04/20/2007  . HX, PERSONAL, COLONIC POLYPS 04/13/2007    Past Medical History  Diagnosis Date  . Hypertension   . Anemia     s/p GI bleed, hospital 5/08  . Adenomatous polyp of colon 03/2007  . GERD (gastroesophageal reflux disease)   . Hiatal hernia     EGD 5/08- stricture, HH, gastritis, GERD  . Depression   . Hypertension   . Endometriosis     uterine US 06/2003  . Diverticulosis   . Iron deficiency 03/16/2014   Past Surgical History  Procedure Laterality Date  . Tubal ligation    . Cardiac ct  9/12    normal    Social History  Substance Use Topics  . Smoking status: Never Smoker   . Smokeless tobacco: Never Used  . Alcohol Use: No   Family History  Problem Relation Age of Onset  . Hypertension Mother   . Cancer Father     unknown type  . Colon cancer Neg Hx   . Esophageal cancer Neg Hx   . Rectal cancer Neg Hx   . Stomach cancer Neg Hx    No Known Allergies Current Outpatient Prescriptions on File Prior to Visit  Medication Sig Dispense Refill  . atorvastatin (LIPITOR) 10 MG tablet TAKE 1 TABLET (10 MG TOTAL) BY MOUTH DAILY. 90 tablet 1  . Esomeprazole Magnesium (NEXIUM  24HR PO) Take 22.3 mg by mouth daily.    . hydrochlorothiazide (HYDRODIURIL) 25 MG tablet Take 1 tablet (25 mg total) by mouth daily. 90 tablet 3  . Multiple Vitamins-Minerals (CENTRUM PO) Take 1 tablet by mouth 2 (two) times a week. Take one by mouth daily    . naproxen sodium (ANAPROX) 220 MG tablet Take 220 mg by mouth daily as needed (pain).    Marland Kitchen sertraline (ZOLOFT) 50 MG tablet TAKE 1 TABLET EVERY DAY 90 tablet 3  . [DISCONTINUED] ranitidine (ZANTAC) 150 MG capsule Take 1 capsule (150 mg total) by mouth 2 (two) times daily. For 3 months 60 capsule 2   No current facility-administered medications on file prior to visit.     Review of Systems Review of Systems  Constitutional: Negative for fever, , and unexpected weight change. pos for severe fatigue 2 d and some dec appetite  Eyes: Negative for pain and visual disturbance.  Respiratory:  Negative for cough and shortness of breath.   Cardiovascular: Negative for cp or palpitations    Gastrointestinal: Negative for nausea, diarrhea and constipation. neg for abd pain (diverticulitis symptoms are resolved) Genitourinary: Negative for urgency and frequency. neg for dysuria  Skin: Negative for pallor or rash   Neurological: Negative for weakness, light-headedness, numbness and headaches.  Hematological: Negative for adenopathy. Does not bruise/bleed easily.  Psychiatric/Behavioral: Negative for dysphoric mood. The patient is not nervous/anxious.  neg for stressors        Objective:   Physical Exam  Constitutional: She appears well-developed and well-nourished. No distress.  obese and well appearing   HENT:  Head: Normocephalic and atraumatic.  Right Ear: External ear normal.  Left Ear: External ear normal.  Nose: Nose normal.  Mouth/Throat: Oropharynx is clear and moist.  Nares are boggy  Eyes: Conjunctivae and EOM are normal. Pupils are equal, round, and reactive to light. Right eye exhibits no discharge. Left eye exhibits no discharge. No scleral icterus.  Neck: Normal range of motion. Neck supple. No JVD present. Carotid bruit is not present. No thyromegaly present.  Cardiovascular: Normal rate, regular rhythm, normal heart sounds and intact distal pulses.  Exam reveals no gallop.   Pulmonary/Chest: Effort normal and breath sounds normal. No respiratory distress. She has no wheezes. She has no rales. She exhibits no tenderness.  Abdominal: Soft. Bowel sounds are normal. She exhibits no distension and no mass. There is no hepatosplenomegaly. There is no tenderness. There is no rebound, no guarding and no CVA tenderness.  Musculoskeletal: She exhibits no edema or tenderness.  Lymphadenopathy:    She has no cervical adenopathy.  Neurological: She is alert. She has normal reflexes. No cranial nerve deficit. She exhibits normal muscle tone. Coordination normal.  Skin: Skin is  warm and dry. No rash noted. No erythema. No pallor.  Psychiatric: She has a normal mood and affect. Her mood appears not anxious. Her affect is not blunt and not labile. Cognition and memory are normal. She does not exhibit a depressed mood.  Pleasant and talkative today  Not depressed appearing           Assessment & Plan:   Problem List Items Addressed This Visit      Other   Obesity    Wt is stable Pt states she is watching diet Also recent dec appetite last 2 d with fatigue  Discussed how this problem influences overall health and the risks it imposes  Reviewed plan for weight loss with lower calorie diet (via  better food choices and also portion control or program like weight watchers) and exercise building up to or more than 30 minutes 5 days per week including some aerobic activity         Fatigue - Primary    Going on for 2 d in pt who was recently (successfully) tx for diverticulitis with cipro and flagyl  Claims not to snore  Did stop exercising a month ago  No s/s of infection or illness and reassuring exam today Labs for fatigue now  Enc to aim for 30 min exercise daily (work up to it)   If all nl may consider sleep apnea eval?      Relevant Orders   CBC with Differential/Platelet   Comprehensive metabolic panel   TSH   Vitamin B12   Ferritin

## 2016-04-11 ENCOUNTER — Telehealth: Payer: Self-pay | Admitting: Family Medicine

## 2016-04-11 DIAGNOSIS — R400 Somnolence: Secondary | ICD-10-CM | POA: Insufficient documentation

## 2016-04-11 NOTE — Telephone Encounter (Signed)
-----   Message from Tammi Sou, [place] sent at 04/11/2016 12:23 PM EDT ----- Pt notified of Dr. Marliss Coots comments and verbalized understanding. Pt agrees with sleep eval., I advise pt Dr. Glori Bickers will put order in and our [facility] will call to schedule appt

## 2016-04-11 NOTE — Telephone Encounter (Signed)
Ref done  Will route to [facility] 

## 2016-06-28 ENCOUNTER — Other Ambulatory Visit: Payer: Self-pay | Admitting: Family Medicine

## 2016-07-03 ENCOUNTER — Institutional Professional Consult (permissible substitution): Payer: Medicare Other | Admitting: Pulmonary Disease

## 2016-07-22 ENCOUNTER — Other Ambulatory Visit: Payer: Self-pay | Admitting: Family Medicine

## 2016-08-08 ENCOUNTER — Other Ambulatory Visit: Payer: Self-pay | Admitting: Family Medicine

## 2016-08-28 ENCOUNTER — Ambulatory Visit (INDEPENDENT_AMBULATORY_CARE_PROVIDER_SITE_OTHER): Payer: Medicare Other | Admitting: Family Medicine

## 2016-08-28 ENCOUNTER — Encounter: Payer: Self-pay | Admitting: Family Medicine

## 2016-08-28 VITALS — BP 142/78 | HR 61 | Wt 180.8 lb

## 2016-08-28 DIAGNOSIS — M25562 Pain in left knee: Secondary | ICD-10-CM

## 2016-08-28 DIAGNOSIS — Z23 Encounter for immunization: Secondary | ICD-10-CM | POA: Diagnosis not present

## 2016-08-28 DIAGNOSIS — H9202 Otalgia, left ear: Secondary | ICD-10-CM

## 2016-08-28 NOTE — Progress Notes (Signed)
Subjective:    Patient ID: Early Chars, female    DOB: March 09, 1946, 70 y.o.   MRN: [id number]  HPI This is a 70 yo female who presents today with left knee pain for one week. Prior to pain starting, she was taking care of her grandchildren and stayed at their two story house. She noticed pain when she got home. No falls or trauma. Pain behind knee when she stands up. Used some ointment and Tylenol Arthritis with minimal relief. No pain at night. Has been using a brace for 2 days and noticed some improvement. No regular exercise; takes care of her 81 yo granddaughter full time.   Left ear feels full since last night. Has had some runny nose. No fever or sore throat. No SOB, no chest pain. Has history of seasonal allergies and takes Claritin PRN; none recently.   Past Medical History:  Diagnosis Date  . Adenomatous polyp of colon 03/2007  . Anemia    s/p GI bleed, hospital 5/08  . Depression   . Diverticulosis   . Endometriosis    uterine US 06/2003  . GERD (gastroesophageal reflux disease)   . Hiatal hernia    EGD 5/08- stricture, HH, gastritis, GERD  . Hypertension   . Hypertension   . Iron deficiency 03/16/2014   Past Surgical History:  Procedure Laterality Date  . cardiac CT  9/12   normal   . TUBAL LIGATION     Family History  Problem Relation Age of Onset  . Hypertension Mother   . Cancer Father     unknown type  . Colon cancer Neg Hx   . Esophageal cancer Neg Hx   . Rectal cancer Neg Hx   . Stomach cancer Neg Hx    Social History  Substance Use Topics  . Smoking status: Never Smoker  . Smokeless tobacco: Never Used  . Alcohol use No      Review of Systems Per HPI    Objective:   Physical Exam  Constitutional: She is oriented to person, place, and time. She appears well-developed and well-nourished. No distress.  HENT:  Head: Atraumatic.  Right Ear: External ear and ear canal normal.  Left Ear: External ear and ear canal normal.  Nose: Nose normal.    Mouth/Throat: No oropharyngeal exudate.  Right TM with several bubble. Left TM dull.   Eyes: Conjunctivae are normal. Pupils are equal, round, and reactive to light. Right eye exhibits no discharge. Left eye exhibits no discharge.  Neck: Normal range of motion. Neck supple.  Cardiovascular: Normal rate, regular rhythm and normal heart sounds.   Pulmonary/Chest: Effort normal and breath sounds normal.  Musculoskeletal:       Left knee: She exhibits normal range of motion, no swelling, no effusion, normal alignment, no LCL laxity, normal patellar mobility and no bony tenderness.  Tender behind knee.   Neurological: She is alert and oriented to person, place, and time.  Skin: Skin is warm and dry. She is not diaphoretic.  Psychiatric: She has a normal mood and affect. Her behavior is normal. Judgment and thought content normal.  Vitals reviewed.     BP (!) 142/78   Pulse 61   Wt 180 lb 12.8 oz (82 kg)   SpO2 97%   BMI 32.28 kg/m  Wt Readings from Last 3 Encounters:  08/28/16 180 lb 12.8 oz (82 kg)  04/05/16 179 lb 4 oz (81.3 kg)  10/18/15 178 lb 8 oz (81 kg)  Assessment & Plan:  1. Acute otalgia, left - suspect related to her allergic rhinitis, encouraged her to start her daily antihistamine - otc analgesics prn pain  2. Acute pain of left knee - likely related to increased stair climbing - encouraged otc NSAID, stretching and strengthening exercises, regular walking, ice/heat prn, continued bracing prn - RTC precautions reviewed   Clarene Reamer, FNP-BC  [facility], [place]  08/28/2016 10:17 AM

## 2016-08-28 NOTE — Patient Instructions (Signed)
Please take some ibuprofen for pain- one to two tablets every 8 to 12 hours as needed for pain Continue to use your brace as needed for comfort  Take your claritin every day for the next couple of weeks Look for stretching exercises on the computer- especially chair yoga   Knee Pain Knee pain is a very common symptom and can have many causes. Knee pain often goes away when you follow your health care provider's instructions for relieving pain and discomfort at home. However, knee pain can develop into a condition that needs treatment. Some conditions may include:  Arthritis caused by wear and tear (osteoarthritis).  Arthritis caused by swelling and irritation (rheumatoid arthritis or gout).  A cyst or growth in your knee.  An infection in your knee joint.  An injury that will not heal.  Damage, swelling, or irritation of the tissues that support your knee (torn ligaments or tendinitis). If your knee pain continues, additional tests may be ordered to diagnose your condition. Tests may include X-rays or other imaging studies of your knee. You may also need to have fluid removed from your knee. Treatment for ongoing knee pain depends on the cause, but treatment may include:  Medicines to relieve pain or swelling.  Steroid injections in your knee.  Physical therapy.  Surgery. HOME CARE INSTRUCTIONS  Take medicines only as directed by your health care provider.  Rest your knee and keep it raised (elevated) while you are resting.  Do not do things that cause or worsen pain.  Avoid high-impact activities or exercises, such as running, jumping rope, or doing jumping jacks.  Apply ice to the knee area:  Put ice in a plastic bag.  Place a towel between your skin and the bag.  Leave the ice on for 20 minutes, 2-3 times a day.  Ask your health care provider if you should wear an elastic knee support.  Keep a pillow under your knee when you sleep.  Lose weight if you are  overweight. Extra weight can put pressure on your knee.  Do not use any tobacco products, including cigarettes, chewing tobacco, or electronic cigarettes. If you need help quitting, ask your health care provider. Smoking may slow the healing of any bone and joint problems that you may have. SEEK MEDICAL CARE IF:  Your knee pain continues, changes, or gets worse.  You have a fever along with knee pain.  Your knee buckles or locks up.  Your knee becomes more swollen. SEEK IMMEDIATE MEDICAL CARE IF:   Your knee joint feels hot to the touch.  You have chest pain or trouble breathing.   This information is not intended to replace advice given to you by your health care provider. Make sure you discuss any questions you have with your health care provider.   Document Released: 08/25/2007 Document Revised: 11/18/2014 Document Reviewed: 06/13/2014 Elsevier Interactive Patient Education [employer].

## 2016-10-10 ENCOUNTER — Telehealth: Payer: Self-pay | Admitting: Family Medicine

## 2016-10-10 DIAGNOSIS — Z0000 Encounter for general adult medical examination without abnormal findings: Secondary | ICD-10-CM

## 2016-10-10 NOTE — Telephone Encounter (Signed)
-----   Message from Ellamae Sia sent at 10/09/2016 10:22 AM EST ----- Regarding: Lab orders for Tuesday, 12.5.17 Patient is scheduled for CPX labs, please order future labs, Thanks , Karna Christmas

## 2016-10-15 ENCOUNTER — Other Ambulatory Visit (INDEPENDENT_AMBULATORY_CARE_PROVIDER_SITE_OTHER): Payer: Medicare Other

## 2016-10-15 ENCOUNTER — Other Ambulatory Visit: Payer: Medicare PPO

## 2016-10-15 DIAGNOSIS — Z0000 Encounter for general adult medical examination without abnormal findings: Secondary | ICD-10-CM

## 2016-10-15 LAB — COMPREHENSIVE METABOLIC PANEL
ALBUMIN: 3.8 g/dL (ref 3.5–5.2)
ALK PHOS: 107 U/L (ref 39–117)
ALT: 37 U/L — ABNORMAL HIGH (ref 0–35)
AST: 33 U/L (ref 0–37)
BILIRUBIN TOTAL: 0.6 mg/dL (ref 0.2–1.2)
BUN: 11 mg/dL (ref 6–23)
CO2: 33 mEq/L — ABNORMAL HIGH (ref 19–32)
Calcium: 10.8 mg/dL — ABNORMAL HIGH (ref 8.4–10.5)
Chloride: 103 mEq/L (ref 96–112)
Creatinine, Ser: 0.85 mg/dL (ref 0.40–1.20)
GFR: 85.18 mL/min (ref >60.00)
Glucose, Bld: 117 mg/dL — ABNORMAL HIGH (ref 70–99)
POTASSIUM: 4.3 meq/L (ref 3.5–5.1)
Sodium: 141 mEq/L (ref 135–145)
TOTAL PROTEIN: 8.2 g/dL (ref 6.0–8.3)

## 2016-10-15 LAB — CBC WITH DIFFERENTIAL/PLATELET
Basophils Absolute: 0 10*3/uL (ref 0.0–0.1)
Basophils Relative: 0.4 % (ref 0.0–3.0)
EOS PCT: 1.2 % (ref 0.0–5.0)
Eosinophils Absolute: 0 10*3/uL (ref 0.0–0.7)
HCT: 40.5 % (ref 36.0–46.0)
HEMOGLOBIN: 13.8 g/dL (ref 12.0–15.0)
Lymphocytes Relative: 43.3 % (ref 12.0–46.0)
Lymphs Abs: 1.6 10*3/uL (ref 0.7–4.0)
MCHC: 34 g/dL (ref 30.0–36.0)
MCV: 95.3 fl (ref 78.0–100.0)
MONOS PCT: 10.4 % (ref 3.0–12.0)
Monocytes Absolute: 0.4 10*3/uL (ref 0.1–1.0)
Neutro Abs: 1.7 10*3/uL (ref 1.4–7.7)
Neutrophils Relative %: 44.7 % (ref 43.0–77.0)
Platelets: 111 10*3/uL — ABNORMAL LOW (ref 150.0–400.0)
RBC: 4.25 Mil/uL (ref 3.87–5.11)
RDW: 13.2 % (ref 11.5–15.5)
WBC: 3.7 10*3/uL — AB (ref 4.0–10.5)

## 2016-10-15 LAB — TSH: TSH: 1.82 u[IU]/mL (ref 0.35–4.50)

## 2016-10-15 LAB — LIPID PANEL
CHOLESTEROL: 170 mg/dL (ref 0–200)
HDL: 66.2 mg/dL (ref >39.00)
LDL Cholesterol: 94 mg/dL (ref 0–99)
NonHDL: 103.41
Total CHOL/HDL Ratio: 3
Triglycerides: 47 mg/dL (ref 0.0–149.0)
VLDL: 9.4 mg/dL (ref 0.0–40.0)

## 2016-10-21 ENCOUNTER — Ambulatory Visit (INDEPENDENT_AMBULATORY_CARE_PROVIDER_SITE_OTHER): Payer: Medicare Other | Admitting: Family Medicine

## 2016-10-21 ENCOUNTER — Encounter: Payer: Self-pay | Admitting: Family Medicine

## 2016-10-21 VITALS — BP 110/62 | HR 45 | Temp 97.5°F | Ht 62.75 in | Wt 178.2 lb

## 2016-10-21 DIAGNOSIS — R7401 Elevation of levels of liver transaminase levels: Secondary | ICD-10-CM

## 2016-10-21 DIAGNOSIS — R739 Hyperglycemia, unspecified: Secondary | ICD-10-CM

## 2016-10-21 DIAGNOSIS — E7800 Pure hypercholesterolemia, unspecified: Secondary | ICD-10-CM

## 2016-10-21 DIAGNOSIS — I10 Essential (primary) hypertension: Secondary | ICD-10-CM | POA: Diagnosis not present

## 2016-10-21 DIAGNOSIS — D72819 Decreased white blood cell count, unspecified: Secondary | ICD-10-CM

## 2016-10-21 DIAGNOSIS — R7303 Prediabetes: Secondary | ICD-10-CM | POA: Insufficient documentation

## 2016-10-21 DIAGNOSIS — Z6831 Body mass index (BMI) 31.0-31.9, adult: Secondary | ICD-10-CM

## 2016-10-21 DIAGNOSIS — R7402 Elevation of levels of lactic acid dehydrogenase (LDH): Secondary | ICD-10-CM

## 2016-10-21 DIAGNOSIS — R740 Nonspecific elevation of levels of transaminase and lactic acid dehydrogenase [LDH]: Secondary | ICD-10-CM

## 2016-10-21 DIAGNOSIS — E6609 Other obesity due to excess calories: Secondary | ICD-10-CM

## 2016-10-21 DIAGNOSIS — D696 Thrombocytopenia, unspecified: Secondary | ICD-10-CM

## 2016-10-21 DIAGNOSIS — Z0000 Encounter for general adult medical examination without abnormal findings: Secondary | ICD-10-CM | POA: Diagnosis not present

## 2016-10-21 DIAGNOSIS — Z86010 Personal history of colonic polyps: Secondary | ICD-10-CM

## 2016-10-21 MED ORDER — ATORVASTATIN CALCIUM 10 MG PO TABS
10.0000 mg | ORAL_TABLET | Freq: Every day | ORAL | 3 refills | Status: DC
Start: 2016-10-21 — End: 2017-10-17

## 2016-10-21 MED ORDER — HYDROCHLOROTHIAZIDE 25 MG PO TABS: 25.0000 mg | ORAL_TABLET | Freq: Every day | ORAL | 3 refills | Status: DC

## 2016-10-21 MED ORDER — SERTRALINE HCL 50 MG PO TABS: ORAL_TABLET | ORAL | 3 refills | Status: DC

## 2016-10-21 NOTE — Assessment & Plan Note (Signed)
Stable Watched at [facility]

## 2016-10-21 NOTE — Assessment & Plan Note (Signed)
bp in fair control at this time  BP Readings from Last 1 Encounters:  10/21/16 110/62   No changes needed Disc lifstyle change with low sodium diet and exercise  Labs reviewed Enc exercise and wt loss

## 2016-10-21 NOTE — Assessment & Plan Note (Signed)
Reviewed health habits including diet and exercise and skin cancer prevention Reviewed appropriate screening tests for age  Also reviewed health mt list, fam hx and immunization status , as well as social and family history   See HPI Labs reviewed  Disc imp of wt loss to prevent DM along with low glycemic diet  bp in good control  Enc her to check on coverage of shingles vaccine Due for colonoscopy in aug  Enc exercise - back to walking  She still declines bone density testing- enc ca and D supplementation  Will schedule f/u for AMW

## 2016-10-21 NOTE — Assessment & Plan Note (Signed)
Lab Results  Component Value Date   ALT 37 (H) 10/15/2016   AST 33 10/15/2016   ALKPHOS 107 10/15/2016   BILITOT 0.6 10/15/2016   Enc wt loss - poss fatty liver Continue to follow

## 2016-10-21 NOTE — Patient Instructions (Addendum)
Fitness is the most important factor to keep you healthy with age  Start walking again !  If you are interested in a shingles/zoster vaccine - call your insurance to check on coverage,( you should not get it within 1 month of other vaccines) , then call us for a prescription  for it to take to a pharmacy that gives the shot , or make a nurse visit to get it here depending on your coverage You are due for colonoscopy 8/18 - if you need a referral at that time let us know   Your blood glucose is mildly elevated You may be at risk for diabetes  Start gradually cutting out sweets Cut back on sugar in the coffee  Cut bread - do open face sandwich or 1/2 of one  Cut potato and rice and chips/corn/snack foods / cereal/ bread  Eat lots of lean protein and green vegetables and limit the rest  Weight loss and exercise will cut risk for diabetes  Try to get 1200-1500 mg of calcium per day with at least 1000 iu of vitamin D - for bone health    Schedule lab in 3 mo  - to check for early diabetes

## 2016-10-21 NOTE — Progress Notes (Signed)
Subjective:    Patient ID: Kathryn Weaver, female    DOB: 07-11-1946, 70 y.o.   MRN: [id number]  HPI Here for health maintenance exam and to review chronic medical problems    Doing well overall   Has not yet had AMW visit   Wt Readings from Last 3 Encounters:  10/21/16 178 lb 4 oz (80.9 kg)  08/28/16 180 lb 12.8 oz (82 kg)  04/05/16 179 lb 4 oz (81.3 kg)  down a few lb today  Not working on it  No regular exercise - needs to get back to walking Eating healthy food/ good choices  bmi 31.8  Declines zoster vaccine and dexa No falls or broken bones    Mammogram 3/17-normal Self breast exam- no lumps or changes   Colonoscopy 8/13- adenoma - due for 5 year recall  No stool changes   Tetanus shot 9/09  Hep C screen 3/15   utd on other vaccines   bp is stable today  No cp or palpitations or headaches or edema  No side effects to medicines  BP Readings from Last 3 Encounters:  10/21/16 110/62  08/28/16 (!) 142/78  04/05/16 122/78     Hx of hyperlipidemia Lab Results  Component Value Date   CHOL 170 10/15/2016   CHOL 171 10/12/2015   CHOL 160 07/12/2014   Lab Results  Component Value Date   HDL 66.20 10/15/2016   HDL 57.20 10/12/2015   HDL 57.30 07/12/2014   Lab Results  Component Value Date   LDLCALC 94 10/15/2016   LDLCALC 102 (H) 10/12/2015   LDLCALC 88 07/12/2014   Lab Results  Component Value Date   TRIG 47.0 10/15/2016   TRIG 60.0 10/12/2015   TRIG 76.0 07/12/2014   Lab Results  Component Value Date   CHOLHDL 3 10/15/2016   CHOLHDL 3 10/12/2015   CHOLHDL 3 07/12/2014   Lab Results  Component Value Date   LDLDIRECT 156.0 04/21/2012   LDLDIRECT 138.9 10/22/2010   LDLDIRECT 163.0 10/20/2009   On atorvastatin 10 Well controlled with diet and medicine   Hx of low platelets Lab Results  Component Value Date   WBC 3.7 (L) 10/15/2016   HGB 13.8 10/15/2016   HCT 40.5 10/15/2016   MCV 95.3 10/15/2016   PLT 111.0 (L) 10/15/2016   platelets are up from 100 to 111 -no bruising  Wbc is stable  Watching this    Results for orders placed or performed in visit on 10/15/16  CBC with Differential/Platelet  Result Value Ref Range   WBC 3.7 (L) 4.0 - 10.5 K/uL   RBC 4.25 3.87 - 5.11 Mil/uL   Hemoglobin 13.8 12.0 - 15.0 g/dL   HCT 40.5 36.0 - 46.0 %   MCV 95.3 78.0 - 100.0 fl   MCHC 34.0 30.0 - 36.0 g/dL   RDW 13.2 11.5 - 15.5 %   Platelets 111.0 (L) 150.0 - 400.0 K/uL   Neutrophils Relative % 44.7 43.0 - 77.0 %   Lymphocytes Relative 43.3 12.0 - 46.0 %   Monocytes Relative 10.4 3.0 - 12.0 %   Eosinophils Relative 1.2 0.0 - 5.0 %   Basophils Relative 0.4 0.0 - 3.0 %   Neutro Abs 1.7 1.4 - 7.7 K/uL   Lymphs Abs 1.6 0.7 - 4.0 K/uL   Monocytes Absolute 0.4 0.1 - 1.0 K/uL   Eosinophils Absolute 0.0 0.0 - 0.7 K/uL   Basophils Absolute 0.0 0.0 - 0.1 K/uL  Comprehensive metabolic panel  Result  Value Ref Range   Sodium 141 135 - 145 mEq/L   Potassium 4.3 3.5 - 5.1 mEq/L   Chloride 103 96 - 112 mEq/L   CO2 33 (H) 19 - 32 mEq/L   Glucose, Bld 117 (H) 70 - 99 mg/dL   BUN 11 6 - 23 mg/dL   Creatinine, Ser 0.85 0.40 - 1.20 mg/dL   Total Bilirubin 0.6 0.2 - 1.2 mg/dL   Alkaline Phosphatase 107 39 - 117 U/L   AST 33 0 - 37 U/L   ALT 37 (H) 0 - 35 U/L   Total Protein 8.2 6.0 - 8.3 g/dL   Albumin 3.8 3.5 - 5.2 g/dL   Calcium 10.8 (H) 8.4 - 10.5 mg/dL   GFR 85.18 >60.00 mL/min  Lipid panel  Result Value Ref Range   Cholesterol 170 0 - 200 mg/dL   Triglycerides 47.0 0.0 - 149.0 mg/dL   HDL 66.20 >39.00 mg/dL   VLDL 9.4 0.0 - 40.0 mg/dL   LDL Cholesterol 94 0 - 99 mg/dL   Total CHOL/HDL Ratio 3    NonHDL 103.41   TSH  Result Value Ref Range   TSH 1.82 0.35 - 4.50 uIU/mL    Fasting glucose high at 117  No DM in family   Eats jelly beans every night  Puts sugar in coffee  Has been eating a lot of bread  No excessive thirst or urination  No numbness of fingers and toes  Patient Active Problem List   Diagnosis  Date Noted  . Hyperglycemia 10/21/2016  . Somnolence, daytime 04/11/2016  . Fatigue 04/05/2016  . Contusion 03/27/2015  . Internal hemorrhoid 04/20/2014  . Iron deficiency 03/16/2014  . Encounter for Medicare annual wellness exam 03/01/2014  . Uterine prolapse 03/01/2014  . Esophageal dysphagia 01/05/2013  . Stress reaction 09/01/2012  . Hyperlipidemia 04/28/2012  . Routine general medical examination at a health care facility 04/20/2012  . Obesity 02/05/2012  . Leukocytopenia 10/23/2010  . Thrombocytopenia ([place]) 10/24/2009  . TRANSAMINASES, SERUM, ELEVATED 10/24/2009  . POSTMENOPAUSAL STATUS 08/09/2008  . DEPRESSION 08/05/2007  . Essential hypertension 08/05/2007  . GERD 04/20/2007  . DIVERTICULAR DISEASE 04/20/2007  . History of colonic polyps 04/13/2007   Past Medical History:  Diagnosis Date  . Adenomatous polyp of colon 03/2007  . Anemia    s/p GI bleed, hospital 5/08  . Depression   . Diverticulosis   . Endometriosis    uterine US 06/2003  . GERD (gastroesophageal reflux disease)   . Hiatal hernia    EGD 5/08- stricture, HH, gastritis, GERD  . Hypertension   . Hypertension   . Iron deficiency 03/16/2014   Past Surgical History:  Procedure Laterality Date  . cardiac CT  9/12   normal   . TUBAL LIGATION     Social History  Substance Use Topics  . Smoking status: Never Smoker  . Smokeless tobacco: Never Used  . Alcohol use No   Family History  Problem Relation Age of Onset  . Hypertension Mother   . Cancer Father     unknown type  . Colon cancer Neg Hx   . Esophageal cancer Neg Hx   . Rectal cancer Neg Hx   . Stomach cancer Neg Hx    No Known Allergies Current Outpatient Prescriptions on File Prior to Visit  Medication Sig Dispense Refill  . Esomeprazole Magnesium (NEXIUM 24HR PO) Take 22.3 mg by mouth daily.    . Multiple Vitamins-Minerals (CENTRUM PO) Take 1 tablet by mouth  2 (two) times a week. Take one by mouth daily    . [DISCONTINUED] ranitidine  (ZANTAC) 150 MG capsule Take 1 capsule (150 mg total) by mouth 2 (two) times daily. For 3 months 60 capsule 2   No current facility-administered medications on file prior to visit.     Review of Systems Review of Systems  Constitutional: Negative for fever, appetite change, fatigue and unexpected weight change.  Eyes: Negative for pain and visual disturbance.  Respiratory: Negative for cough and shortness of breath.   Cardiovascular: Negative for cp or palpitations    Gastrointestinal: Negative for nausea, diarrhea and constipation.  Genitourinary: Negative for urgency and frequency.  Skin: Negative for pallor or rash   Neurological: Negative for weakness, light-headedness, numbness and headaches.  Hematological: Negative for adenopathy. Does not bruise/bleed easily.  Psychiatric/Behavioral: Negative for dysphoric mood. The patient is not nervous/anxious.         Objective:   Physical Exam  Constitutional: She appears well-developed and well-nourished. No distress.  obese and well appearing   HENT:  Head: Normocephalic and atraumatic.  Right Ear: External ear normal.  Left Ear: External ear normal.  Mouth/Throat: Oropharynx is clear and moist.  Eyes: Conjunctivae and EOM are normal. Pupils are equal, round, and reactive to light. No scleral icterus.  Neck: Normal range of motion. Neck supple. No JVD present. Carotid bruit is not present. No thyromegaly present.  Cardiovascular: Normal rate, regular rhythm, normal heart sounds and intact distal pulses.  Exam reveals no gallop.   Pulmonary/Chest: Effort normal and breath sounds normal. No respiratory distress. She has no wheezes. She exhibits no tenderness.  Abdominal: Soft. Bowel sounds are normal. She exhibits no distension, no abdominal bruit and no mass. There is no tenderness.  Genitourinary: No breast swelling, tenderness, discharge or bleeding.  Genitourinary Comments: Breast exam: No mass, nodules, thickening, tenderness,  bulging, retraction, inflamation, nipple discharge or skin changes noted.  No axillary or clavicular LA.      Musculoskeletal: Normal range of motion. She exhibits no edema or tenderness.  Nl sensation in extremities    Lymphadenopathy:    She has no cervical adenopathy.  Neurological: She is alert. She has normal reflexes. No cranial nerve deficit. She exhibits normal muscle tone. Coordination normal.  Skin: Skin is warm and dry. No rash noted. No erythema. No pallor.  Some skin tags on trunk  Psychiatric: She has a normal mood and affect.  Pleasant and talkative           Assessment & Plan:   Problem List Items Addressed This Visit      Cardiovascular and Mediastinum   Essential hypertension    bp in fair control at this time  BP Readings from Last 1 Encounters:  10/21/16 110/62   No changes needed Disc lifstyle change with low sodium diet and exercise  Labs reviewed Enc exercise and wt loss       Relevant Medications   hydrochlorothiazide (HYDRODIURIL) 25 MG tablet   atorvastatin (LIPITOR) 10 MG tablet     Other   TRANSAMINASES, SERUM, ELEVATED    Lab Results  Component Value Date   ALT 37 (H) 10/15/2016   AST 33 10/15/2016   ALKPHOS 107 10/15/2016   BILITOT 0.6 10/15/2016   Enc wt loss - poss fatty liver Continue to follow      Thrombocytopenia ([place])    Platelets at 111 - improved No bleeding/bruising  Wbc stable  Continue to monitor She is also monitored by  hematology at [facility]      Routine general medical examination at a health care facility - Primary    Reviewed health habits including diet and exercise and skin cancer prevention Reviewed appropriate screening tests for age  Also reviewed health mt list, fam hx and immunization status , as well as social and family history   See HPI Labs reviewed  Disc imp of wt loss to prevent DM along with low glycemic diet  bp in good control  Enc her to check on coverage of shingles vaccine Due for  colonoscopy in aug  Enc exercise - back to walking  She still declines bone density testing- enc ca and D supplementation  Will schedule f/u for AMW       Obesity   Leukocytopenia    Stable Watched at [facility]      Hyperlipidemia   Relevant Medications   hydrochlorothiazide (HYDRODIURIL) 25 MG tablet   atorvastatin (LIPITOR) 10 MG tablet   Hyperglycemia    Glucose 117 fasting  Disc risk of Dm/ pre diabetes Long disc re: diet and exercise for wt loss  Low glycemic diet reviewed and disc avoidance of processed/refined sugar Also plan to re start walking for exercise Will check A1C in 3 mo       Relevant Orders   Hemoglobin A1c   History of colonic polyps    It looks like she is due for 5 year recall colonoscopy in Aug  Aware  Will call for ref this summer if needed

## 2016-10-21 NOTE — Assessment & Plan Note (Signed)
Glucose 117 fasting  Disc risk of Dm/ pre diabetes Long disc re: diet and exercise for wt loss  Low glycemic diet reviewed and disc avoidance of processed/refined sugar Also plan to re start walking for exercise Will check A1C in 3 mo

## 2016-10-21 NOTE — Assessment & Plan Note (Signed)
It looks like she is due for 5 year recall colonoscopy in Aug  Aware  Will call for ref this summer if needed

## 2016-10-21 NOTE — Assessment & Plan Note (Signed)
Platelets at 111 - improved No bleeding/bruising  Wbc stable  Continue to monitor She is also monitored by hematology at [facility]

## 2016-10-21 NOTE — Progress Notes (Signed)
Pre visit review using our clinic review tool, if applicable. No additional management support is needed unless otherwise documented below in the visit note. 

## 2016-11-21 ENCOUNTER — Encounter: Payer: Self-pay | Admitting: Family Medicine

## 2016-11-21 ENCOUNTER — Ambulatory Visit (INDEPENDENT_AMBULATORY_CARE_PROVIDER_SITE_OTHER): Payer: Medicare Other | Admitting: Family Medicine

## 2016-11-21 VITALS — BP 112/70 | HR 80 | Temp 98.0°F | Wt 176.5 lb

## 2016-11-21 DIAGNOSIS — N39498 Other specified urinary incontinence: Secondary | ICD-10-CM | POA: Diagnosis not present

## 2016-11-21 DIAGNOSIS — N3001 Acute cystitis with hematuria: Secondary | ICD-10-CM | POA: Diagnosis not present

## 2016-11-21 DIAGNOSIS — N390 Urinary tract infection, site not specified: Secondary | ICD-10-CM | POA: Insufficient documentation

## 2016-11-21 LAB — POC URINALSYSI DIPSTICK (AUTOMATED)
Bilirubin, UA: NEGATIVE
Glucose, UA: NEGATIVE
Ketones, UA: NEGATIVE
Nitrite, UA: NEGATIVE
Spec Grav, UA: 1.025
Urobilinogen, UA: 0.2
pH, UA: 6

## 2016-11-21 MED ORDER — SULFAMETHOXAZOLE-TRIMETHOPRIM 800-160 MG PO TABS: 1.0000 | ORAL_TABLET | Freq: Two times a day (BID) | ORAL | 0 refills | Status: DC

## 2016-11-21 NOTE — Assessment & Plan Note (Signed)
UA and micro consistent with UTI - treat with 5d bactrim. UCx sent. Given degree of hematuria, I have asked her to return in 2-3 wks for rpt UA to ensure cleared. Pt agrees with plan.

## 2016-11-21 NOTE — Progress Notes (Signed)
Pre visit review using our clinic review tool, if applicable. No additional management support is needed unless otherwise documented below in the visit note. 

## 2016-11-21 NOTE — Progress Notes (Signed)
BP 112/70   Pulse 80   Temp 98 F (36.7 C) (Oral)   Wt 176 lb 8 oz (80.1 kg)   BMI 31.52 kg/m    CC: new urinary incontinence Subjective:    Patient ID: Kathryn Weaver, female    DOB: [birth year], 71 y.o.   MRN: [id number]  HPI: Kathryn Weaver is a 71 y.o. female presenting on 11/21/2016 for Urinary Incontinence (once today; not lifting, coughing or doing anything stenuous when it happened)   Here with grand daughter  Isolated episode of urinary incontinence associated with urgency today when getting out of car. Not full incontinence, just leaked onto pad. She did have urge to go "couldn't hold it".   Denies dysuria, increased frequency, hematuria, fevers/chills, nausea, flank pain.  No recent antibiotics.  No recent UTI.   Similar episode 20 yrs ago of urinary incontinence with urgency - had UTI.   Relevant past medical, surgical, family and social history reviewed and updated as indicated. Interim medical history since our last visit reviewed. Allergies and medications reviewed and updated. Current Outpatient Prescriptions on File Prior to Visit  Medication Sig  . atorvastatin (LIPITOR) 10 MG tablet Take 1 tablet (10 mg total) by mouth daily.  . Esomeprazole Magnesium (NEXIUM 24HR PO) Take 22.3 mg by mouth daily.  . hydrochlorothiazide (HYDRODIURIL) 25 MG tablet Take 1 tablet (25 mg total) by mouth daily.  . Multiple Vitamins-Minerals (CENTRUM PO) Take 1 tablet by mouth 2 (two) times a week. Take one by mouth daily  . sertraline (ZOLOFT) 50 MG tablet TAKE 1 TABLETBY MOUTH EVERY DAY  . [DISCONTINUED] ranitidine (ZANTAC) 150 MG capsule Take 1 capsule (150 mg total) by mouth 2 (two) times daily. For 3 months   No current facility-administered medications on file prior to visit.     Review of Systems Per HPI unless specifically indicated in ROS section     Objective:    BP 112/70   Pulse 80   Temp 98 F (36.7 C) (Oral)   Wt 176 lb 8 oz (80.1 kg)   BMI 31.52 kg/m     Wt Readings from Last 3 Encounters:  11/21/16 176 lb 8 oz (80.1 kg)  10/21/16 178 lb 4 oz (80.9 kg)  08/28/16 180 lb 12.8 oz (82 kg)    Physical Exam  Constitutional: She appears well-developed and well-nourished. No distress.  Abdominal: Soft. Normal appearance and bowel sounds are normal. She exhibits no distension and no mass. There is no hepatosplenomegaly. There is no tenderness. There is no rigidity, no rebound, no guarding, no CVA tenderness and negative Murphy's sign.  Musculoskeletal: She exhibits edema (tr).  Skin: Skin is warm and dry.  Psychiatric: She has a normal mood and affect.  Nursing note and vitals reviewed.  Results for orders placed or performed in visit on 11/21/16  POCT Urinalysis Dipstick (Automated)  Result Value Ref Range   Color, UA Yellow    Clarity, UA Cloudy    Glucose, UA Negative    Bilirubin, UA Negative    Ketones, UA Negative    Spec Grav, UA 1.025    Blood, UA 3+    pH, UA 6.0    Protein, UA 1+    Urobilinogen, UA 0.2    Nitrite, UA Negative    Leukocytes, UA moderate (2+) (A) Negative      Assessment & Plan:   Problem List Items Addressed This Visit    UTI (urinary tract infection) - Primary  UA and micro consistent with UTI - treat with 5d bactrim. UCx sent. Given degree of hematuria, I have asked her to return in 2-3 wks for rpt UA to ensure cleared. Pt agrees with plan.      Relevant Medications   sulfamethoxazole-trimethoprim (BACTRIM DS,SEPTRA DS) 800-160 MG tablet   Other Relevant Orders   Urine culture    Other Visit Diagnoses    Other urinary incontinence       Relevant Orders   POCT Urinalysis Dipstick (Automated) (Completed)       Follow up plan: No Follow-up on file.  Ria Bush, MD

## 2016-11-21 NOTE — Patient Instructions (Signed)
You have UTI - treat with bactrim twice daily for 5 days.  Push water and rest. Let us know if persistent symptoms despite treatment.  Good to see you today, call us with questions. I'd like you to return in 2-3 weeks to repeat urinalysis to ensure bleeding has resolved.   Urinary Tract Infection, Adult A urinary tract infection (UTI) is an infection of any part of the urinary tract, which includes the kidneys, ureters, bladder, and urethra. These organs make, store, and get rid of urine in the body. UTI can be a bladder infection (cystitis) or kidney infection (pyelonephritis). What are the causes? This infection may be caused by fungi, viruses, or bacteria. Bacteria are the most common cause of UTIs. This condition can also be caused by repeated incomplete emptying of the bladder during urination. What increases the risk? This condition is more likely to develop if:  You ignore your need to urinate or hold urine for long periods of time.  You do not empty your bladder completely during urination.  You wipe back to front after urinating or having a bowel movement, if you are female.  You are uncircumcised, if you are female.  You are constipated.  You have a urinary catheter that stays in place (indwelling).  You have a weak defense (immune) system.  You have a medical condition that affects your bowels, kidneys, or bladder.  You have diabetes.  You take antibiotic medicines frequently or for long periods of time, and the antibiotics no longer work well against certain types of infections (antibiotic resistance).  You take medicines that irritate your urinary tract.  You are exposed to chemicals that irritate your urinary tract.  You are female. What are the signs or symptoms? Symptoms of this condition include:  Fever.  Frequent urination or passing small amounts of urine frequently.  Needing to urinate urgently.  Pain or burning with urination.  Urine that smells  bad or unusual.  Cloudy urine.  Pain in the lower abdomen or back.  Trouble urinating.  Blood in the urine.  Vomiting or being less hungry than normal.  Diarrhea or abdominal pain.  Vaginal discharge, if you are female. How is this diagnosed? This condition is diagnosed with a medical history and physical exam. You will also need to provide a urine sample to test your urine. Other tests may be done, including:  Blood tests.  Sexually transmitted disease (STD) testing. If you have had more than one UTI, a cystoscopy or imaging studies may be done to determine the cause of the infections. How is this treated? Treatment for this condition often includes a combination of two or more of the following:  Antibiotic medicine.  Other medicines to treat less common causes of UTI.  Over-the-counter medicines to treat pain.  Drinking enough water to stay hydrated. Follow these instructions at home:  Take over-the-counter and prescription medicines only as told by your health care provider.  If you were prescribed an antibiotic, take it as told by your health care provider. Do not stop taking the antibiotic even if you start to feel better.  Avoid alcohol, caffeine, tea, and carbonated beverages. They can irritate your bladder.  Drink enough fluid to keep your urine clear or pale yellow.  Keep all follow-up visits as told by your health care provider. This is important.  Make sure to:  Empty your bladder often and completely. Do not hold urine for long periods of time.  Empty your bladder before and after sex.  Wipe from front to back after a bowel movement if you are female. Use each tissue one time when you wipe. Contact a health care provider if:  You have back pain.  You have a fever.  You feel nauseous or vomit.  Your symptoms do not get better after 3 days.  Your symptoms go away and then return. Get help right away if:  You have severe back pain or lower  abdominal pain.  You are vomiting and cannot keep down any medicines or water. This information is not intended to replace advice given to you by your health care provider. Make sure you discuss any questions you have with your health care provider. Document Released: 08/07/2005 Document Revised: 04/10/2016 Document Reviewed: 09/18/2015 Elsevier Interactive Patient Education  2017 [employer].

## 2016-11-23 LAB — URINE CULTURE

## 2016-11-26 ENCOUNTER — Ambulatory Visit (INDEPENDENT_AMBULATORY_CARE_PROVIDER_SITE_OTHER): Payer: Medicare Other

## 2016-11-26 VITALS — BP 110/70 | HR 48 | Temp 97.6°F | Ht 62.5 in | Wt 177.5 lb

## 2016-11-26 DIAGNOSIS — Z0000 Encounter for general adult medical examination without abnormal findings: Secondary | ICD-10-CM

## 2016-11-26 DIAGNOSIS — R739 Hyperglycemia, unspecified: Secondary | ICD-10-CM

## 2016-11-26 LAB — HEMOGLOBIN A1C: Hgb A1c MFr Bld: 6 % (ref 4.6–6.5)

## 2016-11-26 NOTE — Progress Notes (Signed)
Subjective:   Kathryn Weaver is a 71 y.o. female who presents for Medicare Annual (Subsequent) preventive examination.  Review of Systems:  N/A Cardiac Risk Factors include: advanced age (>43men, >55 women);dyslipidemia;hypertension;obesity (BMI >30kg/m2)     Objective:     Vitals: BP 110/70 (BP Location: Left Arm, Patient Position: Sitting, Cuff Size: Normal)   Pulse (!) 48   Temp 97.6 F (36.4 C) (Oral)   Ht 5' 2.5" (1.588 m) Comment: no shoes  Wt 177 lb 8 oz (80.5 kg)   SpO2 97%   BMI 31.95 kg/m   Body mass index is 31.95 kg/m.   Tobacco History  Smoking Status  . Never Smoker  Smokeless Tobacco  . Never Used     Counseling given: No   Past Medical History:  Diagnosis Date  . Adenomatous polyp of colon 03/2007  . Anemia    s/p GI bleed, hospital 5/08  . Depression   . Diverticulosis   . Endometriosis    uterine US 06/2003  . GERD (gastroesophageal reflux disease)   . Hiatal hernia    EGD 5/08- stricture, HH, gastritis, GERD  . Hypertension   . Hypertension   . Iron deficiency 03/16/2014   Past Surgical History:  Procedure Laterality Date  . cardiac CT  9/12   normal   . TUBAL LIGATION     Family History  Problem Relation Age of Onset  . Cancer Father     unknown type  . Hypertension Mother   . Colon cancer Neg Hx   . Esophageal cancer Neg Hx   . Rectal cancer Neg Hx   . Stomach cancer Neg Hx    History  Sexual Activity  . Sexual activity: Yes    Outpatient Encounter Prescriptions as of 11/26/2016  Medication Sig  . atorvastatin (LIPITOR) 10 MG tablet Take 1 tablet (10 mg total) by mouth daily.  . Esomeprazole Magnesium (NEXIUM 24HR PO) Take 22.3 mg by mouth daily.  . hydrochlorothiazide (HYDRODIURIL) 25 MG tablet Take 1 tablet (25 mg total) by mouth daily.  . Multiple Vitamins-Minerals (CENTRUM PO) Take 1 tablet by mouth 2 (two) times a week. Take one by mouth daily  . sertraline (ZOLOFT) 50 MG tablet TAKE 1 TABLETBY MOUTH EVERY DAY  .  sulfamethoxazole-trimethoprim (BACTRIM DS,SEPTRA DS) 800-160 MG tablet Take 1 tablet by mouth 2 (two) times daily.   No facility-administered encounter medications on file as of 11/26/2016.     Activities of Daily Living In your present state of health, do you have any difficulty performing the following activities: 11/26/2016  Hearing? N  Vision? N  Difficulty concentrating or making decisions? N  Walking or climbing stairs? N  Dressing or bathing? N  Doing errands, shopping? N  Preparing Food and eating ? N  Using the Toilet? N  In the past six months, have you accidently leaked urine? N  Do you have problems with loss of bowel control? N  Managing your Medications? N  Managing your Finances? N  Housekeeping or managing your Housekeeping? N  Some recent data might be hidden    Patient Care Team: Abner Greenspan, MD as PCP - General    Assessment:     Hearing Screening   125Hz  250Hz  500Hz  1000Hz  2000Hz  3000Hz  4000Hz  6000Hz  8000Hz   Right ear:   0 0 40  40    Left ear:   40 0 40  40    Vision Screening Comments: Last vision exam in Aug 2017  Exercise Activities and Dietary recommendations Current Exercise Habits: Home exercise routine, Type of exercise: walking, Time (Minutes): 30, Frequency (Times/Week): 2, Weekly Exercise (Minutes/Week): 60, Intensity: Moderate, Exercise limited by: None identified  Goals    . Increase physical activity          Starting 1/11/16/16, I will continue to walk at least 30 min 1-2 days per week.       Fall Risk Fall Risk  11/26/2016 10/18/2015 03/01/2014  Falls in the past year? No No No   Depression Screen PHQ 2/9 Scores 11/26/2016 10/18/2015 03/01/2014  PHQ - 2 Score 0 0 0     Cognitive Function MMSE - Mini Mental State Exam 11/26/2016  Orientation to time 5  Orientation to Place 5  Registration 3  Attention/ Calculation 0  Recall 3  Language- name 2 objects 0  Language- repeat 1  Language- follow 3 step command 3  Language- read &  follow direction 0  Write a sentence 0  Copy design 0  Total score 20     PLEASE NOTE: A Mini-Cog screen was completed. Maximum score is 20. A value of 0 denotes this part of Folstein MMSE was not completed or the patient failed this part of the Mini-Cog screening.   Mini-Cog Screening Orientation to Time - Max 5 pts Orientation to Place - Max 5 pts Registration - Max 3 pts Recall - Max 3 pts Language Repeat - Max 1 pts Language Follow 3 Step Command - Max 3 pts     Immunization History  Administered Date(s) Administered  . Influenza,inj,Quad PF,36+ Mos 10/18/2015, 08/28/2016  . Pneumococcal Conjugate-13 10/18/2015  . Pneumococcal Polysaccharide-23 03/01/2014  . Td 03/26/1996, 08/09/2008   Screening Tests Health Maintenance  Topic Date Due  . ZOSTAVAX  04/29/2019 (Originally 05/21/2007)  . DEXA SCAN  12/13/2020 (Originally 05/20/2012)  . MAMMOGRAM  01/14/2017  . COLONOSCOPY  07/07/2017  . TETANUS/TDAP  08/09/2018  . INFLUENZA VACCINE  Completed  . Hepatitis C Screening  Completed  . PNA vac Low Risk Adult  Completed      Plan:     I have personally reviewed and addressed the Medicare Annual Wellness questionnaire and have noted the following in the patient's chart:  A. Medical and social history B. Use of alcohol, tobacco or illicit drugs  C. Current medications and supplements D. Functional ability and status E.  Nutritional status F.  Physical activity G. Advance directives H. List of other physicians I.  Hospitalizations, surgeries, and ER visits in previous 12 months J.  [place] to include hearing, vision, cognitive, depression L. Referrals and appointments - none  In addition, I have reviewed and discussed with patient certain preventive protocols, quality metrics, and best practice recommendations. A written personalized care plan for preventive services as well as general preventive health recommendations were provided to patient.  See attached  scanned questionnaire for additional information.   Signed,   Lindell Noe, MHA, BS, LPN Health Coach

## 2016-11-26 NOTE — Progress Notes (Signed)
Pre visit review using our clinic review tool, if applicable. No additional management support is needed unless otherwise documented below in the visit note. 

## 2016-11-26 NOTE — Patient Instructions (Signed)
Ms. Yonke , Thank you for taking time to come for your Medicare Wellness Visit. I appreciate your ongoing commitment to your health goals. Please review the following plan we discussed and let me know if I can assist you in the future.   These are the goals we discussed: Goals    . Increase physical activity          Starting 1/11/16/16, I will continue to walk at least 30 min 1-2 days per week.        This is a list of the screening recommended for you and due dates:  Health Maintenance  Topic Date Due  . Shingles Vaccine  04/29/2019*  . DEXA scan (bone density measurement)  12/13/2020*  . Mammogram  01/14/2017  . Colon Cancer Screening  07/07/2017  . Tetanus Vaccine  08/09/2018  . Flu Shot  Completed  .  Hepatitis C: One time screening is recommended by Center for Disease Control  (CDC) for  adults born from [birth year] through 1964.   Completed  . Pneumonia vaccines  Completed  *Topic was postponed. The date shown is not the original due date.   Preventive Care for Adults  A healthy lifestyle and preventive care can promote health and wellness. Preventive health guidelines for adults include the following key practices.  . A routine yearly physical is a good way to check with your health care provider about your health and preventive screening. It is a chance to share any concerns and updates on your health and to receive a thorough exam.  . Visit your dentist for a routine exam and preventive care every 6 months. Brush your teeth twice a day and floss once a day. Good oral hygiene prevents tooth decay and gum disease.  . The frequency of eye exams is based on your age, health, family medical history, use  of contact lenses, and other factors. Follow your health care provider's ecommendations for frequency of eye exams.  . Eat a healthy diet. Foods like vegetables, fruits, whole grains, low-fat dairy products, and lean protein foods contain the nutrients you need without too many  calories. Decrease your intake of foods high in solid fats, added sugars, and salt. Eat the right amount of calories for you. Get information about a proper diet from your health care provider, if necessary.  . Regular physical exercise is one of the most important things you can do for your health. Most adults should get at least 150 minutes of moderate-intensity exercise (any activity that increases your heart rate and causes you to sweat) each week. In addition, most adults need muscle-strengthening exercises on 2 or more days a week.  Silver Sneakers may be a benefit available to you. To determine eligibility, you may visit the website: www.silversneakers.com or contact program at (732)543-7661 Mon-Fri between 8AM-8PM.   . Maintain a healthy weight. The body mass index (BMI) is a screening tool to identify possible weight problems. It provides an estimate of body fat based on height and weight. Your health care provider can find your BMI and can help you achieve or maintain a healthy weight.   For adults 20 years and older: ? A BMI below 18.5 is considered underweight. ? A BMI of 18.5 to 24.9 is normal. ? A BMI of 25 to 29.9 is considered overweight. ? A BMI of 30 and above is considered obese.   . Maintain normal blood lipids and cholesterol levels by exercising and minimizing your intake of saturated fat. Eat a  balanced diet with plenty of fruit and vegetables. Blood tests for lipids and cholesterol should begin at age 44 and be repeated every 5 years. If your lipid or cholesterol levels are high, you are over 50, or you are at high risk for heart disease, you may need your cholesterol levels checked more frequently. Ongoing high lipid and cholesterol levels should be treated with medicines if diet and exercise are not working.  . If you smoke, find out from your health care provider how to quit. If you do not use tobacco, please do not start.  . If you choose to drink alcohol, please do  not consume more than 2 drinks per day. One drink is considered to be 12 ounces (355 mL) of beer, 5 ounces (148 mL) of wine, or 1.5 ounces (44 mL) of liquor.  . If you are 29-15 years old, ask your health care provider if you should take aspirin to prevent strokes.  . Use sunscreen. Apply sunscreen liberally and repeatedly throughout the day. You should seek shade when your shadow is shorter than you. Protect yourself by wearing long sleeves, pants, a wide-brimmed hat, and sunglasses year round, whenever you are outdoors.  . Once a month, do a whole body skin exam, using a mirror to look at the skin on your back. Tell your health care provider of new moles, moles that have irregular borders, moles that are larger than a pencil eraser, or moles that have changed in shape or color.

## 2016-11-26 NOTE — Progress Notes (Signed)
PCP notes:   Health maintenance:  No gaps identified.   Abnormal screenings:   Hearing - failed  Patient concerns:   None  Nurse concerns:  None  Next PCP appt:   N/A; pt has already had CPE  I reviewed health advisor's note, was available for consultation, and agree with documentation and plan.

## 2016-11-29 ENCOUNTER — Encounter: Payer: Self-pay | Admitting: *Deleted

## 2016-12-03 ENCOUNTER — Telehealth: Payer: Self-pay | Admitting: Family Medicine

## 2016-12-03 DIAGNOSIS — R739 Hyperglycemia, unspecified: Secondary | ICD-10-CM

## 2016-12-03 DIAGNOSIS — Z0000 Encounter for general adult medical examination without abnormal findings: Secondary | ICD-10-CM

## 2016-12-03 NOTE — Telephone Encounter (Signed)
Future labs for 3/18

## 2016-12-04 ENCOUNTER — Telehealth: Payer: Self-pay | Admitting: Family Medicine

## 2016-12-04 DIAGNOSIS — N3000 Acute cystitis without hematuria: Secondary | ICD-10-CM | POA: Insufficient documentation

## 2016-12-04 NOTE — Telephone Encounter (Signed)
-----   Message from Ellamae Sia sent at 12/03/2016  9:51 AM EST ----- Regarding: Lab orders for Thursday, 1.25.18 Do you want a repeat UA? Please order. There are orders in for labs, too. Do you want those?

## 2016-12-04 NOTE — Telephone Encounter (Signed)
Just the UA  I think her labs are for march Thanks

## 2016-12-05 ENCOUNTER — Other Ambulatory Visit (INDEPENDENT_AMBULATORY_CARE_PROVIDER_SITE_OTHER): Payer: Medicare Other

## 2016-12-05 DIAGNOSIS — N3000 Acute cystitis without hematuria: Secondary | ICD-10-CM | POA: Diagnosis not present

## 2016-12-05 LAB — POCT URINALYSIS DIPSTICK
BILIRUBIN UA: NEGATIVE
Blood, UA: NEGATIVE
Glucose, UA: NEGATIVE
Ketones, UA: NEGATIVE
LEUKOCYTES UA: NEGATIVE
NITRITE UA: NEGATIVE
PH UA: 8.5
PROTEIN UA: NEGATIVE
Spec Grav, UA: 1.015
Urobilinogen, UA: 0.2

## 2016-12-11 ENCOUNTER — Ambulatory Visit (INDEPENDENT_AMBULATORY_CARE_PROVIDER_SITE_OTHER): Payer: Medicare Other | Admitting: Primary Care

## 2016-12-11 ENCOUNTER — Ambulatory Visit (INDEPENDENT_AMBULATORY_CARE_PROVIDER_SITE_OTHER)
Admission: RE | Admit: 2016-12-11 | Discharge: 2016-12-11 | Disposition: A | Discharge status: Home / Self Care | Payer: Medicare Other | Source: Ambulatory Visit | Attending: Primary Care | Admitting: Primary Care

## 2016-12-11 ENCOUNTER — Other Ambulatory Visit: Payer: Self-pay | Admitting: Primary Care

## 2016-12-11 ENCOUNTER — Encounter: Payer: Self-pay | Admitting: Primary Care

## 2016-12-11 VITALS — BP 122/76 | Temp 97.5°F | Ht 62.75 in | Wt 181.8 lb

## 2016-12-11 DIAGNOSIS — M25562 Pain in left knee: Secondary | ICD-10-CM

## 2016-12-11 DIAGNOSIS — G8929 Other chronic pain: Secondary | ICD-10-CM

## 2016-12-11 NOTE — Patient Instructions (Signed)
Complete xray(s) prior to leaving today. I will notify you of your results once received.  Try tylenol arthritis for pain.  We will consider physical therapy once your xray has resulted.  It was a pleasure to see you today!

## 2016-12-11 NOTE — Progress Notes (Signed)
Subjective:    Patient ID: Early Chars, female    DOB: October 02, 1946, 71 y.o.   MRN: [id number]  HPI  Kathryn Weaver is a 71 year old female who presents today with a chief complaint of knee pain. Her pain is located to the posterior left knee that has been present for the past 3 months. She denies trauma/injury. She thinks she may have aggravated her knee since walking up and down her daughters stairs back in October 2017. She denies weakness, her knee giving out, numbness/tingling, pain to her calves, right knee pain. She's applied several creams topically, wrapping her knee, and Aleve without much improvement. Her pain doesn't bother her at rest, but will bother her when she first stands from sitting. It takes her a few minutes to start walking. Her pain continues when walking.   Review of Systems  Respiratory: Negative for shortness of breath.   Musculoskeletal: Positive for arthralgias.  Skin: Negative for color change.  Neurological: Negative for weakness and numbness.       Past Medical History:  Diagnosis Date  . Adenomatous polyp of colon 03/2007  . Anemia    s/p GI bleed, hospital 5/08  . Depression   . Diverticulosis   . Endometriosis    uterine US 06/2003  . GERD (gastroesophageal reflux disease)   . Hiatal hernia    EGD 5/08- stricture, HH, gastritis, GERD  . Hypertension   . Hypertension   . Iron deficiency 03/16/2014     Social History   Social History  . Marital status: Married    Spouse name: N/A  . Number of children: N/A  . Years of education: N/A   Occupational History  . [place] History Main Topics  . Smoking status: Never Smoker  . Smokeless tobacco: Never Used  . Alcohol use No  . Drug use: No  . Sexual activity: Yes   Other Topics Concern  . Not on file   Social History Narrative  . No narrative on file    Past Surgical History:  Procedure Laterality Date  . cardiac CT  9/12   normal   . TUBAL LIGATION       Family History  Problem Relation Age of Onset  . Cancer Father     unknown type  . Hypertension Mother   . Colon cancer Neg Hx   . Esophageal cancer Neg Hx   . Rectal cancer Neg Hx   . Stomach cancer Neg Hx     No Known Allergies  Current Outpatient Prescriptions on File Prior to Visit  Medication Sig Dispense Refill  . atorvastatin (LIPITOR) 10 MG tablet Take 1 tablet (10 mg total) by mouth daily. 90 tablet 3  . Esomeprazole Magnesium (NEXIUM 24HR PO) Take 22.3 mg by mouth daily.    . hydrochlorothiazide (HYDRODIURIL) 25 MG tablet Take 1 tablet (25 mg total) by mouth daily. 90 tablet 3  . Multiple Vitamins-Minerals (CENTRUM PO) Take 1 tablet by mouth 2 (two) times a week. Take one by mouth daily    . sertraline (ZOLOFT) 50 MG tablet TAKE 1 TABLETBY MOUTH EVERY DAY 90 tablet 3  . [DISCONTINUED] ranitidine (ZANTAC) 150 MG capsule Take 1 capsule (150 mg total) by mouth 2 (two) times daily. For 3 months 60 capsule 2   No current facility-administered medications on file prior to visit.     BP 122/76   Temp 97.5 F (36.4 C) (Oral)   Ht 5' 2.75" (  1.594 m)   Wt 181 lb 12.8 oz (82.5 kg)   SpO2 99%   BMI 32.46 kg/m    Objective:   Physical Exam  Constitutional: She appears well-nourished.  Neck: Neck supple.  Cardiovascular: Normal rate and regular rhythm.   Pulses:      Dorsalis pedis pulses are 2+ on the left side.       Posterior tibial pulses are 2+ on the left side.  Pulmonary/Chest: Effort normal and breath sounds normal.  Musculoskeletal:       Left knee: She exhibits normal range of motion, no swelling and no erythema. No tenderness found.  Skin: Skin is warm and dry.          Assessment & Plan:  Knee Pain:  Located to left posterior knee x 3 months, pain no worse or better. Exam today suggestive of arthritis. No masses, swelling, tenderness. 2+ pedal pulses to left lower extremity. No calf pain. Do not suspect DVT, bakers cyst, or arterial circulatory  problem based of exam and HPI. Will obtain xray today. Consider physical therapy once xray results. Tylenol arthritis for pain.  Sheral Flow, NP

## 2016-12-11 NOTE — Progress Notes (Signed)
Pre visit review using our clinic review tool, if applicable. No additional management support is needed unless otherwise documented below in the visit note. 

## 2016-12-30 ENCOUNTER — Ambulatory Visit: Payer: Medicare Other | Admitting: Family Medicine

## 2016-12-30 ENCOUNTER — Telehealth: Payer: Self-pay | Admitting: Primary Care

## 2016-12-30 NOTE — Telephone Encounter (Signed)
Noted. Sent to PCP as FYI

## 2016-12-30 NOTE — Telephone Encounter (Signed)
Received a faxed note from physical therapy today stating that they suspected DVT. 1. Has the patient noticed redness, swelling, or pain to her calf or leg? 2. If so, then please schedule her to see me in the office as soon as possible.

## 2016-12-30 NOTE — Telephone Encounter (Signed)
Patient stated that she will went to [facility] and got an MRI. Its shows that patient have cyst in the back of her knee that causing her pain. Patient is going to have surgery on 01/22/2017 to remove the cyst.

## 2017-01-20 ENCOUNTER — Other Ambulatory Visit: Payer: Medicare Other

## 2017-01-29 ENCOUNTER — Encounter: Payer: Self-pay | Admitting: Family Medicine

## 2017-01-29 ENCOUNTER — Ambulatory Visit (INDEPENDENT_AMBULATORY_CARE_PROVIDER_SITE_OTHER): Payer: Medicare Other | Admitting: Family Medicine

## 2017-01-29 VITALS — BP 102/62 | HR 94 | Temp 98.7°F | Wt 178.5 lb

## 2017-01-29 DIAGNOSIS — K5903 Drug induced constipation: Secondary | ICD-10-CM | POA: Diagnosis not present

## 2017-01-29 DIAGNOSIS — R1084 Generalized abdominal pain: Secondary | ICD-10-CM | POA: Diagnosis not present

## 2017-01-29 NOTE — Progress Notes (Signed)
   Subjective:    Patient ID: Kathryn Weaver, female    DOB: 01/03/1946, 71 y.o.   MRN: [id number] Kathryn Weaver HPI This is a 71 yo female who presents with abdominal pain x 5-6 days. Has not had bowel movement in about 1 week. Took one dose of Miralax last night, no BM, has a lot of gas. Pain is across abdomen. Feels bloated. No fever or nausea. Some decreased appetite, but eating ok. No dysuria, no hematuria, no urinary frequency. Has history of diverticulitis in past, this pain is not same.  One week ago had surgery on her left knee for cyst. Was taking pain medicine (oxycodone) but has stopped. Doing well, little pain, has follow up appointment tomorrow.   Past Medical History:  Diagnosis Date  . Adenomatous polyp of colon 03/2007  . Anemia    s/p GI bleed, hospital 5/08  . Depression   . Diverticulosis   . Endometriosis    uterine US 06/2003  . GERD (gastroesophageal reflux disease)   . Hiatal hernia    EGD 5/08- stricture, HH, gastritis, GERD  . Hypertension   . Hypertension   . Iron deficiency 03/16/2014   Past Surgical History:  Procedure Laterality Date  . cardiac CT  9/12   normal   . TUBAL LIGATION     Family History  Problem Relation Age of Onset  . Cancer Father     unknown type  . Hypertension Mother   . Colon cancer Neg Hx   . Esophageal cancer Neg Hx   . Rectal cancer Neg Hx   . Stomach cancer Neg Hx    Social History  Substance Use Topics  . Smoking status: Never Smoker  . Smokeless tobacco: Never Used  . Alcohol use No      Review of Systems Per HPI    Objective:   Physical Exam  Constitutional: She is oriented to person, place, and time. She appears well-developed and well-nourished. No distress.  Eyes: Conjunctivae are normal.  Cardiovascular: Normal rate, regular rhythm and normal heart sounds.   Pulmonary/Chest: Effort normal and breath sounds normal.  Abdominal: Soft. Bowel sounds are normal. She exhibits no distension. There is no tenderness.  There is no rebound and no guarding.  Neurological: She is alert and oriented to person, place, and time.  Skin: Skin is warm and dry. She is not diaphoretic.  Psychiatric: She has a normal Weaver and affect. Her behavior is normal. Judgment and thought content normal.  Vitals reviewed.    BP 102/62 (BP Location: Left Arm, Patient Position: Sitting, Cuff Size: Normal)   Pulse 94   Temp 98.7 F (37.1 C) (Oral)   Wt 178 lb 8 oz (81 kg)   SpO2 96%   BMI 31.87 kg/m  Wt Readings from Last 3 Encounters:  01/29/17 178 lb 8 oz (81 kg)  12/11/16 181 lb 12.8 oz (82.5 kg)  11/26/16 177 lb 8 oz (80.5 kg)       Assessment & Plan:  1. Generalized abdominal pain - suspect related to constipation - no fever or worrisome findings on exam - RTC/ER precautions reviewed  2. Drug-induced constipation - provided written and verbal instructions for bowel purge.  - if no adequate bowel movement by tomorrow she was instructed to call the office  Clarene Reamer, FNP-BC  [facility], [place]  01/29/2017 12:40 PM

## 2017-01-29 NOTE — Patient Instructions (Addendum)
For acute constipation- Take 4 Dulcolax. Wait 1 hour then take 3-4 doses of Miralax over 2-3 hours For maintenance use Colace (stool softener, generic fine) and Miralax 1-3 doses daily until you are regulated Add a probiotic daily  If you have not had adequate bowel movement in 24 hours, please return to clinic    Constipation, Adult Constipation is when a person has fewer bowel movements in a week than normal, has difficulty having a bowel movement, or has stools that are dry, hard, or larger than normal. Constipation may be caused by an underlying condition. It may become worse with age if a person takes certain medicines and does not take in enough fluids. Follow these instructions at home: Eating and drinking    Eat foods that have a lot of fiber, such as fresh fruits and vegetables, whole grains, and beans.  Limit foods that are high in fat, low in fiber, or overly processed, such as french fries, hamburgers, cookies, candies, and soda.  Drink enough fluid to keep your urine clear or pale yellow. General instructions   Exercise regularly or as told by your health care provider.  Go to the restroom when you have the urge to go. Do not hold it in.  Take over-the-counter and prescription medicines only as told by your health care provider. These include any fiber supplements.  Practice pelvic floor retraining exercises, such as deep breathing while relaxing the lower abdomen and pelvic floor relaxation during bowel movements.  Watch your condition for any changes.  Keep all follow-up visits as told by your health care provider. This is important. Contact a health care provider if:  You have pain that gets worse.  You have a fever.  You do not have a bowel movement after 4 days.  You vomit.  You are not hungry.  You lose weight.  You are bleeding from the anus.  You have thin, pencil-like stools. Get help right away if:  You have a fever and your symptoms suddenly  get worse.  You leak stool or have blood in your stool.  Your abdomen is bloated.  You have severe pain in your abdomen.  You feel dizzy or you faint. This information is not intended to replace advice given to you by your health care provider. Make sure you discuss any questions you have with your health care provider. Document Released: 07/26/2004 Document Revised: 05/17/2016 Document Reviewed: 04/17/2016 Elsevier Interactive Patient Education  2017 [employer].

## 2017-01-29 NOTE — Progress Notes (Signed)
Pre visit review using our clinic review tool, if applicable. No additional management support is needed unless otherwise documented below in the visit note. 

## 2017-02-05 ENCOUNTER — Other Ambulatory Visit (INDEPENDENT_AMBULATORY_CARE_PROVIDER_SITE_OTHER): Payer: Medicare Other

## 2017-02-05 DIAGNOSIS — Z0000 Encounter for general adult medical examination without abnormal findings: Secondary | ICD-10-CM

## 2017-02-05 LAB — CBC WITH DIFFERENTIAL/PLATELET
Basophils Absolute: 0 10*3/uL (ref 0.0–0.1)
Basophils Relative: 0.3 % (ref 0.0–3.0)
EOS ABS: 0.1 10*3/uL (ref 0.0–0.7)
Eosinophils Relative: 1.4 % (ref 0.0–5.0)
HEMATOCRIT: 38.9 % (ref 36.0–46.0)
HEMOGLOBIN: 12.7 g/dL (ref 12.0–15.0)
LYMPHS PCT: 41.4 % (ref 12.0–46.0)
Lymphs Abs: 1.8 10*3/uL (ref 0.7–4.0)
MCHC: 32.7 g/dL (ref 30.0–36.0)
MCV: 97.2 fl (ref 78.0–100.0)
MONO ABS: 0.6 10*3/uL (ref 0.1–1.0)
Monocytes Relative: 14.6 % — ABNORMAL HIGH (ref 3.0–12.0)
Neutro Abs: 1.9 10*3/uL (ref 1.4–7.7)
Neutrophils Relative %: 42.3 % — ABNORMAL LOW (ref 43.0–77.0)
Platelets: 151 10*3/uL (ref 150.0–400.0)
RBC: 4 Mil/uL (ref 3.87–5.11)
RDW: 12.6 % (ref 11.5–15.5)
WBC: 4.4 10*3/uL (ref 4.0–10.5)

## 2017-02-05 LAB — COMPREHENSIVE METABOLIC PANEL
ALBUMIN: 3.6 g/dL (ref 3.5–5.2)
ALK PHOS: 154 U/L — AB (ref 39–117)
ALT: 112 U/L — AB (ref 0–35)
AST: 26 U/L (ref 0–37)
BILIRUBIN TOTAL: 0.5 mg/dL (ref 0.2–1.2)
BUN: 15 mg/dL (ref 6–23)
CO2: 33 mEq/L — ABNORMAL HIGH (ref 19–32)
CREATININE: 0.91 mg/dL (ref 0.40–1.20)
Calcium: 11 mg/dL — ABNORMAL HIGH (ref 8.4–10.5)
Chloride: 102 mEq/L (ref 96–112)
GFR: 78.66 mL/min (ref >60.00)
Glucose, Bld: 109 mg/dL — ABNORMAL HIGH (ref 70–99)
Potassium: 4.2 mEq/L (ref 3.5–5.1)
SODIUM: 140 meq/L (ref 135–145)
Total Protein: 8.2 g/dL (ref 6.0–8.3)

## 2017-02-05 LAB — LIPID PANEL
CHOLESTEROL: 142 mg/dL (ref 0–200)
HDL: 43.7 mg/dL (ref >39.00)
LDL CALC: 85 mg/dL (ref 0–99)
NonHDL: 98.37
Total CHOL/HDL Ratio: 3
Triglycerides: 65 mg/dL (ref 0.0–149.0)
VLDL: 13 mg/dL (ref 0.0–40.0)

## 2017-02-05 LAB — TSH: TSH: 2.54 u[IU]/mL (ref 0.35–4.50)

## 2017-02-17 ENCOUNTER — Ambulatory Visit (INDEPENDENT_AMBULATORY_CARE_PROVIDER_SITE_OTHER): Payer: Medicare Other | Admitting: Family Medicine

## 2017-02-17 ENCOUNTER — Encounter: Payer: Self-pay | Admitting: Family Medicine

## 2017-02-17 VITALS — BP 126/64 | HR 82 | Temp 97.9°F | Ht 62.75 in | Wt 166.5 lb

## 2017-02-17 DIAGNOSIS — D696 Thrombocytopenia, unspecified: Secondary | ICD-10-CM | POA: Diagnosis not present

## 2017-02-17 DIAGNOSIS — R5382 Chronic fatigue, unspecified: Secondary | ICD-10-CM | POA: Diagnosis not present

## 2017-02-17 LAB — CBC WITH DIFFERENTIAL/PLATELET
BASOS ABS: 0 10*3/uL (ref 0.0–0.1)
Basophils Relative: 0.3 % (ref 0.0–3.0)
EOS ABS: 0.1 10*3/uL (ref 0.0–0.7)
Eosinophils Relative: 1.8 % (ref 0.0–5.0)
HCT: 38.6 % (ref 36.0–46.0)
Hemoglobin: 12.7 g/dL (ref 12.0–15.0)
LYMPHS PCT: 47.4 % — AB (ref 12.0–46.0)
Lymphs Abs: 1.6 10*3/uL (ref 0.7–4.0)
MCHC: 32.9 g/dL (ref 30.0–36.0)
MCV: 96.9 fl (ref 78.0–100.0)
MONO ABS: 0.4 10*3/uL (ref 0.1–1.0)
Monocytes Relative: 13.3 % — ABNORMAL HIGH (ref 3.0–12.0)
NEUTROS PCT: 37.2 % — AB (ref 43.0–77.0)
Neutro Abs: 1.2 10*3/uL — ABNORMAL LOW (ref 1.4–7.7)
Platelets: 99 10*3/uL — ABNORMAL LOW (ref 150.0–400.0)
RBC: NORMAL Mil/uL (ref 3.87–5.11)
RDW: 12.9 % (ref 11.5–15.5)
WBC: 3.3 10*3/uL — AB (ref 4.0–10.5)

## 2017-02-17 LAB — COMPREHENSIVE METABOLIC PANEL
ALK PHOS: 96 U/L (ref 39–117)
ALT: 21 U/L (ref 0–35)
AST: 19 U/L (ref 0–37)
Albumin: 3.7 g/dL (ref 3.5–5.2)
BUN: 13 mg/dL (ref 6–23)
CO2: 33 mEq/L — ABNORMAL HIGH (ref 19–32)
CREATININE: 0.82 mg/dL (ref 0.40–1.20)
Calcium: 11.2 mg/dL — ABNORMAL HIGH (ref 8.4–10.5)
Chloride: 103 mEq/L (ref 96–112)
GFR: 88.7 mL/min (ref >60.00)
GLUCOSE: 111 mg/dL — AB (ref 70–99)
POTASSIUM: 4.2 meq/L (ref 3.5–5.1)
SODIUM: 142 meq/L (ref 135–145)
Total Bilirubin: 0.5 mg/dL (ref 0.2–1.2)
Total Protein: 8.6 g/dL — ABNORMAL HIGH (ref 6.0–8.3)

## 2017-02-17 LAB — FERRITIN: FERRITIN: 143.7 ng/mL (ref 10.0–291.0)

## 2017-02-17 LAB — VITAMIN B12: Vitamin B-12: 1092 pg/mL — ABNORMAL HIGH (ref 211–911)

## 2017-02-17 LAB — VITAMIN D 25 HYDROXY (VIT D DEFICIENCY, FRACTURES): VITD: 15.33 ng/mL — ABNORMAL LOW (ref 30.00–100.00)

## 2017-02-17 LAB — TSH: TSH: 2.43 u[IU]/mL (ref 0.35–4.50)

## 2017-02-17 NOTE — Assessment & Plan Note (Addendum)
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

## 2017-02-17 NOTE — Progress Notes (Signed)
Pre visit review using our clinic review tool, if applicable. No additional management support is needed unless otherwise documented below in the visit note. 

## 2017-02-17 NOTE — Assessment & Plan Note (Signed)
Lab today  No bruising or bleeding

## 2017-02-17 NOTE — Patient Instructions (Signed)
Labs today for fatigue  It is possible you also have a virus Rest and drink lots of fluids If you develop fever or respiratory symptoms or urinary symptoms let us know

## 2017-02-17 NOTE — Progress Notes (Signed)
Subjective:    Patient ID: Early Chars, female    DOB: 01-09-1946, 71 y.o.   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 This may be getting too much for her   Had percocet for her knee - not taking it now   bp is stable today  No cp or palpitations or headaches or edema  No side effects to medicines  BP Readings from Last 3 Encounters:  02/17/17 126/64  01/29/17 102/62  12/11/16 122/76     Lab Results  Component Value Date   TSH 2.54 02/05/2017   Lab Results  Component Value Date   WBC 4.4 02/05/2017   HGB 12.7 02/05/2017   HCT 38.9 02/05/2017   MCV 97.2 02/05/2017   PLT 151.0 02/05/2017     Chemistry      Component Value Date/Time   NA 140 02/05/2017 0823   NA 139 02/21/2012 0925   K 4.2 02/05/2017 0823   K 4.0 02/21/2012 0925   CL 102 02/05/2017 0823   CL 102 02/21/2012 0925   CO2 33 (H) 02/05/2017 0823   CO2 32 02/21/2012 0925   BUN 15 02/05/2017 0823   BUN 11 02/21/2012 0925   CREATININE 0.91 02/05/2017 0823   CREATININE 0.84 02/21/2012 0925      Component Value Date/Time   CALCIUM 11.0 (H) 02/05/2017 0823   CALCIUM 10.1 02/21/2012 0925   ALKPHOS 154 (H) 02/05/2017 0823   AST 26 02/05/2017 0823   ALT 112 (H) 02/05/2017 0823   BILITOT 0.5 02/05/2017 0823      Lab Results  Component Value Date   VITAMINB12 1,059 (H) 04/05/2016   She takes  2000 mcg B12 daily otc   Patient Active Problem List   Diagnosis Date Noted  . Hyperglycemia 10/21/2016  . Somnolence, daytime 04/11/2016  . Fatigue 04/05/2016  . Contusion 03/27/2015  . Internal hemorrhoid 04/20/2014  . Iron deficiency 03/16/2014  . Encounter for Medicare annual wellness exam 03/01/2014  . Uterine prolapse 03/01/2014  . Esophageal dysphagia 01/05/2013  . Stress reaction 09/01/2012  . Hyperlipidemia 04/28/2012  . Routine general medical examination at a health care facility 04/20/2012  . Obesity 02/05/2012  . Leukocytopenia 10/23/2010  . Thrombocytopenia ([place]) 10/24/2009  . TRANSAMINASES, SERUM, ELEVATED 10/24/2009  . POSTMENOPAUSAL STATUS 08/09/2008  . DEPRESSION 08/05/2007  . Essential hypertension 08/05/2007  . GERD 04/20/2007  . DIVERTICULAR DISEASE 04/20/2007  . History of colonic polyps 04/13/2007   Past Medical History:  Diagnosis Date  . Adenomatous polyp of colon 03/2007  . Anemia    s/p GI bleed, hospital 5/08  . Depression   . Diverticulosis   . Endometriosis  uterine US 06/2003  . GERD (gastroesophageal reflux disease)   . Hiatal hernia    EGD 5/08- stricture, HH, gastritis, GERD  . Hypertension   . Hypertension   . Iron deficiency 03/16/2014   Past Surgical History:  Procedure Laterality Date  . cardiac CT  9/12   normal   . TUBAL LIGATION     Social History  Substance Use Topics  . Smoking status: Never Smoker  . Smokeless tobacco: Never Used  . Alcohol use No   Family History  Problem Relation Age of Onset  . Cancer Father     unknown type  . Hypertension Mother   . Colon cancer Neg Hx   . Esophageal cancer Neg Hx   . Rectal cancer Neg Hx   . Stomach cancer Neg Hx    No Known Allergies Current Outpatient Prescriptions on File Prior to Visit  Medication Sig Dispense Refill  . atorvastatin (LIPITOR) 10 MG tablet Take 1 tablet (10 mg total) by mouth daily. 90 tablet 3  . Esomeprazole Magnesium (NEXIUM 24HR PO) Take 22.3  mg by mouth daily.    . hydrochlorothiazide (HYDRODIURIL) 25 MG tablet Take 1 tablet (25 mg total) by mouth daily. 90 tablet 3  . Multiple Vitamins-Minerals (CENTRUM PO) Take 1 tablet by mouth 2 (two) times a week. Take one by mouth daily    . sertraline (ZOLOFT) 50 MG tablet TAKE 1 TABLETBY MOUTH EVERY DAY 90 tablet 3  . [DISCONTINUED] ranitidine (ZANTAC) 150 MG capsule Take 1 capsule (150 mg total) by mouth 2 (two) times daily. For 3 months 60 capsule 2   No current facility-administered medications on file prior to visit.      Review of Systems Review of Systems  Constitutional: Negative for fever, appetite change,  and pos for unexpected weight change. (loss -poss due to resolution of constipation) Eyes: Negative for pain and visual disturbance.  Respiratory: Negative for cough and shortness of breath.   Cardiovascular: Negative for cp or palpitations    Gastrointestinal: Negative for nausea, diarrhea and constipation. (resolved) Genitourinary: Negative for urgency and frequency.  Skin: Negative for pallor or rash   Neurological: Negative for weakness, light-headedness, numbness and headaches.  Hematological: Negative for adenopathy. Does not bruise/bleed easily.  Psychiatric/Behavioral: Negative for dysphoric mood. The patient is not nervous/anxious.         Objective:   Physical Exam  Constitutional: She appears well-developed and well-nourished. No distress.  Well appearing but seems fatigued   HENT:  Head: Normocephalic and atraumatic.  Right Ear: External ear normal.  Left Ear: External ear normal.  Nose: Nose normal.  Mouth/Throat: Oropharynx is clear and moist.  Eyes: EOM are normal. Pupils are equal, round, and reactive to light. Right eye exhibits no discharge. Left eye exhibits no discharge. No scleral icterus.  Mild conj pallor  Neck: Normal range of motion. Neck supple. No JVD present. Carotid bruit is not present. No thyromegaly present.  Cardiovascular: Normal  rate, regular rhythm, normal heart sounds and intact distal pulses.  Exam reveals no gallop.   Pulmonary/Chest: Effort normal and breath sounds normal. No respiratory distress. She has no wheezes. She has no rales.  Abdominal: Soft. Bowel sounds are normal. She exhibits no distension and no mass. There is no tenderness. There is no rebound and no guarding.  Musculoskeletal: She exhibits no edema or tenderness.  Lymphadenopathy:    She has no cervical adenopathy.  Neurological: She is alert. She has normal reflexes. No cranial nerve deficit.  She exhibits normal muscle tone. Coordination normal.  Skin: Skin is warm and dry. No rash noted. No erythema. No pallor.  Psychiatric: She has a normal mood and affect. Her mood appears not anxious. Her affect is not blunt and not labile. She does not exhibit a depressed mood.          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

## 2017-02-23 ENCOUNTER — Telehealth: Payer: Self-pay | Admitting: Family Medicine

## 2017-02-23 DIAGNOSIS — D696 Thrombocytopenia, unspecified: Secondary | ICD-10-CM

## 2017-02-23 DIAGNOSIS — D72819 Decreased white blood cell count, unspecified: Secondary | ICD-10-CM

## 2017-02-23 NOTE — Telephone Encounter (Signed)
-----   Message from Ellamae Sia sent at 02/20/2017  5:06 PM EDT ----- Regarding: Lab orders for Friday, 4.27.18 Lab orders, no f/u appt

## 2017-03-07 ENCOUNTER — Other Ambulatory Visit: Payer: Medicare Other

## 2017-03-12 ENCOUNTER — Other Ambulatory Visit (INDEPENDENT_AMBULATORY_CARE_PROVIDER_SITE_OTHER): Payer: Medicare Other

## 2017-03-12 DIAGNOSIS — D696 Thrombocytopenia, unspecified: Secondary | ICD-10-CM | POA: Diagnosis not present

## 2017-03-12 DIAGNOSIS — D72819 Decreased white blood cell count, unspecified: Secondary | ICD-10-CM | POA: Diagnosis not present

## 2017-03-12 LAB — CBC WITH DIFFERENTIAL/PLATELET
BASOS ABS: 0 10*3/uL (ref 0.0–0.1)
Basophils Relative: 0.6 % (ref 0.0–3.0)
EOS ABS: 0.1 10*3/uL (ref 0.0–0.7)
Eosinophils Relative: 1.6 % (ref 0.0–5.0)
HEMATOCRIT: 36.6 % (ref 36.0–46.0)
HEMOGLOBIN: 12.2 g/dL (ref 12.0–15.0)
Lymphocytes Relative: 54.8 % — ABNORMAL HIGH (ref 12.0–46.0)
Lymphs Abs: 1.9 10*3/uL (ref 0.7–4.0)
MCHC: 33.3 g/dL (ref 30.0–36.0)
MCV: 97.3 fl (ref 78.0–100.0)
MONOS PCT: 13.4 % — AB (ref 3.0–12.0)
Monocytes Absolute: 0.5 10*3/uL (ref 0.1–1.0)
Neutro Abs: 1 10*3/uL — ABNORMAL LOW (ref 1.4–7.7)
Neutrophils Relative %: 29.6 % — ABNORMAL LOW (ref 43.0–77.0)
Platelets: 86 10*3/uL — ABNORMAL LOW (ref 150.0–400.0)
RBC: 3.76 Mil/uL — AB (ref 3.87–5.11)
RDW: 13.3 % (ref 11.5–15.5)
WBC: 3.5 10*3/uL — ABNORMAL LOW (ref 4.0–10.5)

## 2017-03-13 ENCOUNTER — Other Ambulatory Visit: Payer: Medicare Other

## 2017-03-13 LAB — PATHOLOGIST SMEAR REVIEW

## 2017-03-14 ENCOUNTER — Telehealth: Payer: Self-pay | Admitting: Family Medicine

## 2017-03-14 DIAGNOSIS — D696 Thrombocytopenia, unspecified: Secondary | ICD-10-CM

## 2017-03-14 DIAGNOSIS — D72819 Decreased white blood cell count, unspecified: Secondary | ICD-10-CM

## 2017-03-14 NOTE — Telephone Encounter (Signed)
I ref her to hematology to check in in light of the fact she still feels so tired  Will route to [facility]

## 2017-03-14 NOTE — Telephone Encounter (Signed)
Left voicemail requesting pt to call the office back 

## 2017-03-14 NOTE — Telephone Encounter (Signed)
-----   Message from Tammi Sou, [place] sent at 03/14/2017 12:51 PM EDT ----- Pt notified of lab results and Dr. Marliss Coots comments. Pt said her sxs are the exact same she is still really fatigued and has no energy. Pt said the last time she saw a hematologist in 2015 he cleared her and she didn't think she needed to go back. Pt said if you think she needs to go back she is okay with a referral to see the hematologist at [facility] but it's been so long she doesn't remember his name or phone #, but she did ask what the next step should be since she isn't feeling any better

## 2017-03-14 NOTE — Telephone Encounter (Signed)
Pt notified of Dr. Marliss Coots comments and she is aware our [facility] will call her to schedule appt

## 2017-04-21 ENCOUNTER — Encounter (HOSPITAL_COMMUNITY): Payer: Medicare Other | Attending: Oncology | Admitting: Oncology

## 2017-04-21 ENCOUNTER — Encounter (HOSPITAL_COMMUNITY): Payer: Self-pay | Admitting: Oncology

## 2017-04-21 ENCOUNTER — Encounter (HOSPITAL_COMMUNITY): Payer: Medicare Other

## 2017-04-21 ENCOUNTER — Other Ambulatory Visit (HOSPITAL_COMMUNITY): Payer: Self-pay | Admitting: Oncology

## 2017-04-21 VITALS — BP 132/62 | HR 52 | Temp 97.5°F | Resp 16 | Ht 63.0 in | Wt 166.5 lb

## 2017-04-21 DIAGNOSIS — E611 Iron deficiency: Secondary | ICD-10-CM | POA: Diagnosis present

## 2017-04-21 DIAGNOSIS — D696 Thrombocytopenia, unspecified: Secondary | ICD-10-CM | POA: Diagnosis not present

## 2017-04-21 DIAGNOSIS — Z809 Family history of malignant neoplasm, unspecified: Secondary | ICD-10-CM | POA: Diagnosis not present

## 2017-04-21 DIAGNOSIS — C9110 Chronic lymphocytic leukemia of B-cell type not having achieved remission: Secondary | ICD-10-CM

## 2017-04-21 DIAGNOSIS — Z862 Personal history of diseases of the blood and blood-forming organs and certain disorders involving the immune mechanism: Secondary | ICD-10-CM

## 2017-04-21 LAB — CBC WITH DIFFERENTIAL/PLATELET
BASOS ABS: 0 10*3/uL (ref 0.0–0.1)
Basophils Relative: 0 %
EOS PCT: 1 %
Eosinophils Absolute: 0 10*3/uL (ref 0.0–0.7)
HCT: 39.5 % (ref 36.0–46.0)
Hemoglobin: 12.6 g/dL (ref 12.0–15.0)
LYMPHS PCT: 44 %
Lymphs Abs: 1.5 10*3/uL (ref 0.7–4.0)
MCH: 31.7 pg (ref 26.0–34.0)
MCHC: 31.9 g/dL (ref 30.0–36.0)
MCV: 99.2 fL (ref 78.0–100.0)
MONO ABS: 0.3 10*3/uL (ref 0.1–1.0)
MONOS PCT: 9 %
Neutro Abs: 1.6 10*3/uL — ABNORMAL LOW (ref 1.7–7.7)
Neutrophils Relative %: 46 %
PLATELETS: 101 10*3/uL — AB (ref 150–400)
RBC: 3.98 MIL/uL (ref 3.87–5.11)
RDW: 12.9 % (ref 11.5–15.5)
WBC: 3.5 10*3/uL — ABNORMAL LOW (ref 4.0–10.5)

## 2017-04-21 LAB — COMPREHENSIVE METABOLIC PANEL
ALT: 23 U/L (ref 14–54)
ANION GAP: 9 (ref 5–15)
AST: 28 U/L (ref 15–41)
Albumin: 3.6 g/dL (ref 3.5–5.0)
Alkaline Phosphatase: 75 U/L (ref 38–126)
BUN: 9 mg/dL (ref 6–20)
CHLORIDE: 100 mmol/L — AB (ref 101–111)
CO2: 31 mmol/L (ref 22–32)
Calcium: 10.3 mg/dL (ref 8.9–10.3)
Creatinine, Ser: 0.74 mg/dL (ref 0.44–1.00)
Glucose, Bld: 107 mg/dL — ABNORMAL HIGH (ref 65–99)
POTASSIUM: 3.8 mmol/L (ref 3.5–5.1)
Sodium: 140 mmol/L (ref 135–145)
TOTAL PROTEIN: 8 g/dL (ref 6.5–8.1)
Total Bilirubin: 0.7 mg/dL (ref 0.3–1.2)

## 2017-04-21 LAB — IRON AND TIBC
IRON: 55 ug/dL (ref 28–170)
SATURATION RATIOS: 18 % (ref 10.4–31.8)
TIBC: 309 ug/dL (ref 250–450)
UIBC: 254 ug/dL

## 2017-04-21 NOTE — Patient Instructions (Addendum)
[place] at [facility] Discharge Instructions  RECOMMENDATIONS MADE BY THE CONSULTANT AND ANY TEST RESULTS WILL BE SENT TO YOUR REFERRING PHYSICIAN.  You were seen today by Dr. Oliva Bustard. Labs today, we will call you with those results. Return in 1 week for follow up.   Thank you for choosing [place] at [facility] to provide your oncology and hematology care.  To afford each patient quality time with our provider, please arrive at least 15 minutes before your scheduled appointment time.    If you have a lab appointment with the [facility] please come in thru the  Main Entrance and check in at the main information desk  You need to re-schedule your appointment should you arrive 10 or more minutes late.  We strive to give you quality time with our providers, and arriving late affects you and other patients whose appointments are after yours.  Also, if you no show three or more times for appointments you may be dismissed from the clinic at the providers discretion.     Again, thank you for choosing [facility].  Our hope is that these requests will decrease the amount of time that you wait before being seen by our physicians.       _____________________________________________________________  Should you have questions after your visit to [facility], please contact our office at [phone] between the hours of 8:30 a.m. and 4:30 p.m.  Voicemails left after 4:30 p.m. will not be returned until the following business day.  For prescription refill requests, have your pharmacy contact our office.       Resources For Cancer Patients and their Caregivers ? American Cancer Society: Can assist with transportation, wigs, general needs, runs Look Good Feel Better.        951-117-3984 ? Cancer Care: Provides financial assistance, online support groups, medication/co-pay assistance.  1-800-813-HOPE 618-188-6025) ? [facility] Assists [place] cancer patients and their families through emotional , educational and financial support.  239-325-3834 ? [place] DSS Where to apply for food stamps, Medicaid and utility assistance. 610-162-1397 ? [facility]: Transportation to medical appointments. 260-549-4922 ? Social Security Administration: May apply for disability if have a Stage IV cancer. (253) 203-6405 2132519922 ? [employer], Disability and Transit Services: Assists with nutrition, care and transit needs. [place] Support Programs: @10RELATIVEDAYS @ > Cancer Support Group  2nd Tuesday of the month 1pm-2pm, Journey Room  > Creative Journey  3rd Tuesday of the month 1130am-1pm, Journey Room  > Look Good Feel Better  1st Wednesday of the month 10am-12 noon, Journey Room (Call [facility] to register [phone])

## 2017-04-21 NOTE — Progress Notes (Signed)
[place] @ [facility] [phone]  Fax:([phone] Roy: Jan 24, 1946  MR#: [id number]  TIW#:[id number]  Patient Care Team: Abner Greenspan, MD as PCP - General  CHIEF COMPLAINT:  Chief Complaint  Patient presents with  . Follow-up  71 year old lady feeling extremely weak and tired and fatigue over several months.  Has not lost any weight.  In April of 2018 patient was found to have platelet count of 99,000.  And atypical lymphocytes now here for further evaluation and treatment consideration.  VISIT DIAGNOSIS: thrombocytopenia   No history exists.    Oncology Flowsheet 01/25/2014  ferumoxytol [facility]) IV 1,020 mg    INTERVAL HISTORY:   [age]-year-old lady who had previously been treated with intravenous iron for iron deficiency anemia.  Patient has a colonoscopy lasts getting regular mammogram however there  is no documented mammogram since last year.  Here for further follow-up and treatment consideration REVIEW OF SYSTEMS:   GENERAL:  He is extremely weak and tired.    Active.  No fevers, sweats or weight loss. PERFORMANCE STATUS (ECOG): 1 HEENT:  No visual changes, runny nose, sore throat, mouth sores or tenderness. Lungs: No shortness of breath or cough.  No hemoptysis. Cardiac:  No chest pain, palpitations, orthopnea, or PND. GI:  No nausea, vomiting, diarrhea, constipation, melena or hematochezia. Stool for occult blood was not checked the last few years.  According to her getting colonoscopy every 10 years GU:  No urgency, frequency, dysuria, or hematuria. Musculoskeletal:  No back pain.  No joint pain.  No muscle tenderness. Extremities:  No pain or swelling. Skin:  No rashes or skin changes. Neuro:  No headache, numbness or weakness, balance or coordination issues. Endocrine:  No diabetes, thyroid issues, hot flashes or night sweats. Psych:  No mood changes, depression or anxiety. Pain:  No focal pain. Review of  systems:  All other systems reviewed and found to be negative.  As per HPI. Otherwise, a complete review of systems is negatve.  PAST MEDICAL HISTORY: Past Medical History:  Diagnosis Date  . Adenomatous polyp of colon 03/2007  . Anemia    s/p GI bleed, hospital 5/08  . Depression   . Diverticulosis   . Endometriosis    uterine US 06/2003  . GERD (gastroesophageal reflux disease)   . Hiatal hernia    EGD 5/08- stricture, HH, gastritis, GERD  . Hypertension   . Hypertension   . Iron deficiency 03/16/2014    PAST SURGICAL HISTORY: Past Surgical History:  Procedure Laterality Date  . cardiac CT  9/12   normal   . TUBAL LIGATION      FAMILY HISTORY Family History  Problem Relation Age of Onset  . Cancer Father        unknown type  . Hypertension Mother   . Colon cancer Neg Hx   . Esophageal cancer Neg Hx   . Rectal cancer Neg Hx   . Stomach cancer Neg Hx     GYNECOLOGIC HISTORY:  No LMP recorded. Patient is postmenopausal.     ADVANCED DIRECTIVES:    HEALTH MAINTENANCE: Social History  Substance Use Topics  . Smoking status: Never Smoker  . Smokeless tobacco: Never Used  . Alcohol use No     No Known Allergies  Current Outpatient Prescriptions  Medication Sig Dispense Refill  . atorvastatin (LIPITOR) 10 MG tablet Take 1 tablet (10 mg total) by mouth daily. 90 tablet 3  . cholecalciferol (VITAMIN  D) 1000 units tablet Take 5,000 Units by mouth daily.    . Esomeprazole Magnesium (NEXIUM 24HR PO) Take 22.3 mg by mouth daily.    . hydrochlorothiazide (HYDRODIURIL) 25 MG tablet Take 1 tablet (25 mg total) by mouth daily. 90 tablet 3  . Multiple Vitamins-Minerals (CENTRUM PO) Take 1 tablet by mouth 2 (two) times a week. Take one by mouth daily    . sertraline (ZOLOFT) 50 MG tablet TAKE 1 TABLETBY MOUTH EVERY DAY 90 tablet 3   No current facility-administered medications for this visit.     OBJECTIVE: PHYSICAL EXAM: General  status: Performance status is good.   Patient has not lost significant weight. Since last evaluation there is no significant change in the general status HEENT: No evidence of stomatitis. Sclera and conjunctivae :: No jaundice.   pale looking. Lungs: Air  entry equal on both sides.  No rhonchi.  No rales.  Cardiac: Heart sounds are normal.  No pericardial rub.  No murmur. Lymphatic system: Cervical, axillary, inguinal, lymph nodes not palpable GI: Abdomen is soft.liver and spleen not palpable.  No ascites.  Bowel sounds are normal.  No other palpable masses.  No tenderness . Lower extremity: No edema Neurological system: Higher functions, cranial nerves intact no evidence of peripheral neuropathy. Skin: No rash.  No ecchymosis.. No petechial hemorrhages  Vitals:   04/21/17 0837  BP: 132/62  Pulse: (!) 52  Resp: 16  Temp: 97.5 F (36.4 C)     Body mass index is 29.49 kg/m.    ECOG FS:1 - Symptomatic but completely ambulatory  LAB RESULTS:  No visits with results within 5 Day(s) from this visit.  Latest known visit with results is:  Lab on 03/12/2017  Component Date Value Ref Range Status  . WBC 03/12/2017 3.5* 4.0 - 10.5 K/uL Final  . RBC 03/12/2017 3.76* 3.87 - 5.11 Mil/uL Final  . Hemoglobin 03/12/2017 12.2  12.0 - 15.0 g/dL Final  . HCT 03/12/2017 36.6  36.0 - 46.0 % Final  . MCV 03/12/2017 97.3  78.0 - 100.0 fl Final  . MCHC 03/12/2017 33.3  30.0 - 36.0 g/dL Final  . RDW 03/12/2017 13.3  11.5 - 15.5 % Final  . Platelets 03/12/2017 86.0* 150.0 - 400.0 K/uL Final   Giant Platelets seen on smear.  . Neutrophils Relative % 03/12/2017 29.6* 43.0 - 77.0 % Final  . Lymphocytes Relative 03/12/2017 54.8* 12.0 - 46.0 % Final  . Monocytes Relative 03/12/2017 13.4* 3.0 - 12.0 % Final  . Eosinophils Relative 03/12/2017 1.6  0.0 - 5.0 % Final  . Basophils Relative 03/12/2017 0.6  0.0 - 3.0 % Final  . Neutro Abs 03/12/2017 1.0* 1.4 - 7.7 K/uL Final  . Lymphs Abs 03/12/2017 1.9  0.7 - 4.0 K/uL Final  . Monocytes Absolute  03/12/2017 0.5  0.1 - 1.0 K/uL Final  . Eosinophils Absolute 03/12/2017 0.1  0.0 - 0.7 K/uL Final  . Basophils Absolute 03/12/2017 0.0  0.0 - 0.1 K/uL Final  . Atypical Lymphocytes Manual 03/12/2017 Presence of* None Final  . Path Review 03/12/2017 SEE NOTE   Final   Comment: Leukopenia due to absolute neutropenia.  Myeloid population consists predominantly of mature segmented neutrophils with reactive changes. Rare atypical lymphs. No immature cells are identified.  Platelet clumps noted on smear-unable to estimate. RBCs demonstrate slight polychromasia. Reviewed by Francis Gaines Mammarappallil MD (Electronic Signature on File) 03/13/2017.      STUDIES: Iron iron-binding capacity.  CBC with review of peripheral smear.  CLL profile ASSESSMENT:  Thrombocytopenia On review of peripheral smear that are likely comes may be artificially living showing lower platelet count.eat CBC and review  Iron studies will be repeated.  We will try to get CLL profile as there are atypical lymphocytes  If neutropenia persistent possibility of bone marrow aspiration and biopsy to rule out any myelodysplastic syndrome can be considered PLAN:  Reevaluate patient after all lab studies are available. Patient expressed understanding and was in agreement with this plan. She also understands that She can call clinic at any time with any questions, concerns, or complaints.    Cancer Staging No matching staging information was found for the patient.  Forest Gleason, MD   04/21/2017 9:02 AM

## 2017-04-29 ENCOUNTER — Encounter (HOSPITAL_BASED_OUTPATIENT_CLINIC_OR_DEPARTMENT_OTHER): Payer: Medicare Other | Admitting: Oncology

## 2017-04-29 ENCOUNTER — Encounter (HOSPITAL_COMMUNITY): Payer: Self-pay

## 2017-04-29 ENCOUNTER — Ambulatory Visit (HOSPITAL_COMMUNITY): Payer: Medicare Other

## 2017-04-29 VITALS — BP 134/70 | HR 81 | Resp 16 | Ht 63.0 in | Wt 167.3 lb

## 2017-04-29 DIAGNOSIS — D696 Thrombocytopenia, unspecified: Secondary | ICD-10-CM | POA: Diagnosis not present

## 2017-04-29 DIAGNOSIS — R5383 Other fatigue: Secondary | ICD-10-CM | POA: Diagnosis not present

## 2017-04-29 DIAGNOSIS — D708 Other neutropenia: Secondary | ICD-10-CM

## 2017-04-29 NOTE — Progress Notes (Signed)
Kathryn Weaver:    Kathryn Weaver, Kathryn Fanny, MD [place] [address]   DIAGNOSIS: Cancer Staging No matching staging information was found for the patient.  SUMMARY OF ONCOLOGIC HISTORY: Leukopenia and thrombocytopenia   INTERVAL HISTORY: Kathryn Weaver 71 y.o. female returns for follow Weaver of her leukopenia and thrombocytopenia. Lab work on 04/21/17 nausea WBC 3.5K, hematoma 12.6 g/dL, hematocrit 39.5%, MCV 99.2, platelet count 101K, ANC 1.6K otherwise differential is normal. Platelet count a month ago was 86K. Iron studies were within normal limits. Lymphocyte subset analysis is pending. ANA is pending. Patient continues to complain of fatigue. She states that she needs to lie down after she does any housework at home. She denies any drenching night sweats or unexplained weight loss. Otherwise review of systems is negative.   Patient Active Problem List   Diagnosis Date Noted  . Hyperglycemia 10/21/2016  . Somnolence, daytime 04/11/2016  . Fatigue 04/05/2016  . Contusion 03/27/2015  . Internal hemorrhoid 04/20/2014  . Iron deficiency 03/16/2014  . Encounter for Medicare annual wellness exam 03/01/2014  . Uterine prolapse 03/01/2014  . Esophageal dysphagia 01/05/2013  . Stress reaction 09/01/2012  . Hyperlipidemia 04/28/2012  . Routine general medical examination at a health care facility 04/20/2012  . Obesity 02/05/2012  . Leukocytopenia 10/23/2010  . Thrombocytopenia ([place]) 10/24/2009  . TRANSAMINASES, SERUM, ELEVATED 10/24/2009  . POSTMENOPAUSAL STATUS 08/09/2008  . DEPRESSION 08/05/2007  . Essential hypertension 08/05/2007  . GERD 04/20/2007  . DIVERTICULAR DISEASE 04/20/2007  . History of colonic polyps 04/13/2007    has No Known Allergies.  MEDICAL HISTORY: Past Medical History:  Diagnosis Date  . Adenomatous polyp of colon 03/2007  . Anemia    s/p GI bleed, hospital 5/08  . Depression   . Diverticulosis   .  Endometriosis    uterine US 06/2003  . GERD (gastroesophageal reflux disease)   . Hiatal hernia    EGD 5/08- stricture, HH, gastritis, GERD  . Hypertension   . Hypertension   . Iron deficiency 03/16/2014    SURGICAL HISTORY: Past Surgical History:  Procedure Laterality Date  . cardiac CT  9/12   normal   . TUBAL LIGATION      SOCIAL HISTORY: Social History   Social History  . Marital status: Married    Spouse name: N/A  . Number of children: N/A  . Years of education: N/A   Occupational History  . [employer] History Main Topics  . Smoking status: Never Smoker  . Smokeless tobacco: Never Used  . Alcohol use No  . Drug use: No  . Sexual activity: Yes   Other Topics Concern  . Not on file   Social History Narrative  . No narrative on file    FAMILY HISTORY: Family History  Problem Relation Age of Onset  . Cancer Father        unknown type  . Hypertension Mother   . Colon cancer Neg Hx   . Esophageal cancer Neg Hx   . Rectal cancer Neg Hx   . Stomach cancer Neg Hx     Review of Systems  Constitutional: Positive for fatigue. Negative for appetite change, chills and fever.  HENT:   Negative for hearing loss, lump/mass, mouth sores, sore throat and tinnitus.   Eyes: Negative for eye problems and icterus.  Respiratory: Negative for chest tightness, cough, hemoptysis, shortness of breath and wheezing.   Cardiovascular: Negative  for chest pain, leg swelling and palpitations.  Gastrointestinal: Negative for abdominal distention, abdominal pain, blood in stool, diarrhea, nausea and vomiting.  Endocrine: Negative.  Negative for hot flashes.  Genitourinary: Negative for difficulty urinating, frequency and hematuria.   Musculoskeletal: Negative for arthralgias and neck pain.  Skin: Negative for itching and rash.  Neurological: Negative for dizziness, headaches and speech difficulty.  Hematological: Negative for adenopathy. Does not bruise/bleed  easily.  Psychiatric/Behavioral: Negative for confusion. The patient is not nervous/anxious.       PHYSICAL EXAMINATION    Vitals:   04/29/17 1330  BP: 134/70  Pulse: 81  Resp: 16    Physical Exam  Constitutional: She is oriented to person, place, and time and well-developed, well-nourished, and in no distress. No distress.  HENT:  Head: Normocephalic and atraumatic.  Mouth/Throat: No oropharyngeal exudate.  Eyes: Conjunctivae are normal. Pupils are equal, round, and reactive to light. No scleral icterus.  Neck: Normal range of motion. Neck supple. No JVD present.  Cardiovascular: Normal rate, regular rhythm and normal heart sounds.  Exam reveals no gallop and no friction rub.   No murmur heard. Pulmonary/Chest: Breath sounds normal. No respiratory distress. She has no wheezes. She has no rales.  Abdominal: Soft. Bowel sounds are normal. She exhibits no distension. There is no tenderness. There is no guarding.  Musculoskeletal: She exhibits no edema or tenderness.  Lymphadenopathy:    She has no cervical adenopathy.    She has no axillary adenopathy.       Right: No supraclavicular adenopathy present.       Left: No supraclavicular adenopathy present.  Neurological: She is alert and oriented to person, place, and time. No cranial nerve deficit.  Skin: Skin is warm and dry. No rash noted. No erythema. No pallor.  Psychiatric: Affect and judgment normal.    LABORATORY DATA:  CBC    Component Value Date/Time   WBC 3.5 (L) 04/21/2017 0939   RBC 3.98 04/21/2017 0939   HGB 12.6 04/21/2017 0939   HGB 13.6 02/21/2012 0925   HCT 39.5 04/21/2017 0939   HCT 42.0 02/21/2012 0925   PLT 101 (L) 04/21/2017 0939   PLT 106 (L) 02/21/2012 0925   MCV 99.2 04/21/2017 0939   MCV 98 02/21/2012 0925   MCH 31.7 04/21/2017 0939   MCHC 31.9 04/21/2017 0939   RDW 12.9 04/21/2017 0939   RDW 13.4 02/21/2012 0925   LYMPHSABS 1.5 04/21/2017 0939   LYMPHSABS 1.1 02/21/2012 0925   MONOABS  0.3 04/21/2017 0939   MONOABS 0.3 02/21/2012 0925   EOSABS 0.0 04/21/2017 0939   EOSABS 0.0 02/21/2012 0925   BASOSABS 0.0 04/21/2017 0939   BASOSABS 0.0 02/21/2012 0925    CMP     Component Value Date/Time   NA 140 04/21/2017 0939   NA 139 02/21/2012 0925   K 3.8 04/21/2017 0939   K 4.0 02/21/2012 0925   CL 100 (L) 04/21/2017 0939   CL 102 02/21/2012 0925   CO2 31 04/21/2017 0939   CO2 32 02/21/2012 0925   GLUCOSE 107 (H) 04/21/2017 0939   GLUCOSE 103 (H) 02/21/2012 0925   BUN 9 04/21/2017 0939   BUN 11 02/21/2012 0925   CREATININE 0.74 04/21/2017 0939   CREATININE 0.84 02/21/2012 0925   CALCIUM 10.3 04/21/2017 0939   CALCIUM 10.1 02/21/2012 0925   PROT 8.0 04/21/2017 0939   ALBUMIN 3.6 04/21/2017 0939   AST 28 04/21/2017 0939   ALT 23 04/21/2017 0939   ALKPHOS  75 04/21/2017 0939   BILITOT 0.7 04/21/2017 0939   GFRNONAA >60 04/21/2017 0939   GFRNONAA >60 02/21/2012 0925   GFRAA >60 04/21/2017 0939   GFRAA >60 02/21/2012 0925       PENDING LABS:   RADIOGRAPHIC STUDIES:  No results found.   PATHOLOGY:     ASSESSMENT and THERAPY PLAN:  Chronic leukopenia and thrombocytopenia:  Patient has chronic leukopenia and thrombocytopenia of unclear etiology. I have discussed bone marrow biopsy with the patient for definitive diagnosis to rule out MDS and other underlying bone marrow disorders and she is willing to proceed. Patient is going on vacation in July and prefers to have her bone marrow biopsy done afterwards. Schedule for a bone marrow bx with IR the week of July 16 or the week after. Then return to clinic 2 weeks after bmx to review results.   Orders Placed This Encounter  Procedures  . CT Biopsy    Standing Status:   Future    Standing Expiration Date:   04/29/2018    Order Specific Question:   Lab orders requested (DO NOT place separate lab orders, these will be automatically ordered during procedure specimen collection):    Answer:   Other     Comments:   surgical path, flow cytometry, and cytogenetics    Order Specific Question:   Reason for Exam (SYMPTOM  OR DIAGNOSIS REQUIRED)    Answer:   chronic thrombocytopenia and leukopenia. R/O MDS.    Order Specific Question:   Preferred imaging location?    Answer:   [facility]    Order Specific Question:   Radiology Contrast Protocol - do NOT remove file path    Answer:   [email or web address]    All questions were answered. The patient knows to call the clinic with any problems, questions or concerns. We can certainly see the patient much sooner if necessary. This note was electronically signed. Twana First, MD 04/29/2017

## 2017-05-07 ENCOUNTER — Encounter: Payer: Self-pay | Admitting: Gastroenterology

## 2017-05-23 ENCOUNTER — Other Ambulatory Visit: Payer: Self-pay | Admitting: Physician Assistant

## 2017-05-23 ENCOUNTER — Other Ambulatory Visit: Payer: Self-pay | Admitting: Radiology

## 2017-05-26 ENCOUNTER — Ambulatory Visit (HOSPITAL_COMMUNITY)
Admission: RE | Admit: 2017-05-26 | Discharge: 2017-05-26 | Disposition: A | Discharge status: Home / Self Care | Payer: Medicare Other | Source: Ambulatory Visit | Attending: Oncology | Admitting: Oncology

## 2017-05-26 ENCOUNTER — Encounter (HOSPITAL_COMMUNITY): Payer: Self-pay

## 2017-05-26 ENCOUNTER — Other Ambulatory Visit (HOSPITAL_COMMUNITY): Payer: Self-pay | Admitting: Oncology

## 2017-05-26 DIAGNOSIS — D696 Thrombocytopenia, unspecified: Secondary | ICD-10-CM | POA: Insufficient documentation

## 2017-05-26 DIAGNOSIS — Z8249 Family history of ischemic heart disease and other diseases of the circulatory system: Secondary | ICD-10-CM | POA: Diagnosis not present

## 2017-05-26 DIAGNOSIS — D72820 Lymphocytosis (symptomatic): Secondary | ICD-10-CM | POA: Diagnosis not present

## 2017-05-26 DIAGNOSIS — I10 Essential (primary) hypertension: Secondary | ICD-10-CM | POA: Diagnosis not present

## 2017-05-26 DIAGNOSIS — Z79899 Other long term (current) drug therapy: Secondary | ICD-10-CM | POA: Insufficient documentation

## 2017-05-26 DIAGNOSIS — D72819 Decreased white blood cell count, unspecified: Secondary | ICD-10-CM | POA: Insufficient documentation

## 2017-05-26 DIAGNOSIS — K219 Gastro-esophageal reflux disease without esophagitis: Secondary | ICD-10-CM | POA: Insufficient documentation

## 2017-05-26 DIAGNOSIS — F329 Major depressive disorder, single episode, unspecified: Secondary | ICD-10-CM | POA: Diagnosis not present

## 2017-05-26 DIAGNOSIS — Z86010 Personal history of colonic polyps: Secondary | ICD-10-CM | POA: Insufficient documentation

## 2017-05-26 DIAGNOSIS — D708 Other neutropenia: Secondary | ICD-10-CM

## 2017-05-26 LAB — PROTIME-INR
INR: 1
Prothrombin Time: 13.2 seconds (ref 11.4–15.2)

## 2017-05-26 LAB — CBC
HEMATOCRIT: 39.1 % (ref 36.0–46.0)
HEMOGLOBIN: 13 g/dL (ref 12.0–15.0)
MCH: 32.6 pg (ref 26.0–34.0)
MCHC: 33.2 g/dL (ref 30.0–36.0)
MCV: 98 fL (ref 78.0–100.0)
Platelets: 93 10*3/uL — ABNORMAL LOW (ref 150–400)
RBC: 3.99 MIL/uL (ref 3.87–5.11)
RDW: 13 % (ref 11.5–15.5)
WBC: 2.8 10*3/uL — ABNORMAL LOW (ref 4.0–10.5)

## 2017-05-26 LAB — APTT: APTT: 31 s (ref 24–36)

## 2017-05-26 MED ORDER — FENTANYL CITRATE (PF) 100 MCG/2ML IJ SOLN
INTRAMUSCULAR | Status: AC | PRN
  Administered 2017-05-26 (×2): 50 ug via INTRAVENOUS

## 2017-05-26 MED ORDER — SODIUM CHLORIDE 0.9 % IV SOLN
INTRAVENOUS | Status: DC
  Administered 2017-05-26: 08:00:00 via INTRAVENOUS

## 2017-05-26 MED ORDER — MIDAZOLAM HCL 2 MG/2ML IJ SOLN
INTRAMUSCULAR | Status: AC
  Filled 2017-05-26: qty 4

## 2017-05-26 MED ORDER — LIDOCAINE HCL (PF) 1 % IJ SOLN
INTRAMUSCULAR | Status: AC | PRN
  Administered 2017-05-26: 10 mL via INTRADERMAL

## 2017-05-26 MED ORDER — MIDAZOLAM HCL 2 MG/2ML IJ SOLN
INTRAMUSCULAR | Status: AC | PRN
  Administered 2017-05-26 (×3): 1 mg via INTRAVENOUS

## 2017-05-26 MED ORDER — FLUMAZENIL 0.5 MG/5ML IV SOLN
INTRAVENOUS | Status: AC
  Filled 2017-05-26: qty 5

## 2017-05-26 MED ORDER — FENTANYL CITRATE (PF) 100 MCG/2ML IJ SOLN
INTRAMUSCULAR | Status: AC
  Filled 2017-05-26: qty 4

## 2017-05-26 MED ORDER — NALOXONE HCL 0.4 MG/ML IJ SOLN
INTRAMUSCULAR | Status: AC
  Filled 2017-05-26: qty 1

## 2017-05-26 NOTE — H&P (Signed)
Chief Complaint: Patient was seen in consultation today for bone marrow biopsy at the request of Zhou,Louise  Referring Physician(s): Zhou,Louise  Supervising Physician: Sandi Mariscal  Patient Status:  [facility] - Out-pt  History of Present Illness: Kathryn Weaver is a 71 y.o. female being worked up for chronic leukopenia and thrombocytopenia. She is referred for bone marrow biopsy. PMHx, meds, labs, allergies reviewed. Has been NPO this am. Husband at bedside.  Past Medical History:  Diagnosis Date  . Adenomatous polyp of colon 03/2007  . Anemia    s/p GI bleed, hospital 5/08  . Depression   . Diverticulosis   . Endometriosis    uterine US 06/2003  . GERD (gastroesophageal reflux disease)   . Hiatal hernia    EGD 5/08- stricture, HH, gastritis, GERD  . Hypertension   . Hypertension   . Iron deficiency 03/16/2014    Past Surgical History:  Procedure Laterality Date  . cardiac CT  9/12   normal   . TUBAL LIGATION      Allergies: Patient has no known allergies.  Medications: Prior to Admission medications   Medication Sig Start Date End Date Taking? Authorizing Provider  atorvastatin (LIPITOR) 10 MG tablet Take 1 tablet (10 mg total) by mouth daily. 10/21/16  Yes Tower, [name], MD  cholecalciferol (VITAMIN D) 1000 units tablet Take 5,000 Units by mouth daily.   Yes [provider]  hydrochlorothiazide (HYDRODIURIL) 25 MG tablet Take 1 tablet (25 mg total) by mouth daily. 10/21/16  Yes Tower, [name], MD  Multiple Vitamins-Minerals (CENTRUM PO) Take 1 tablet by mouth 2 (two) times a week. Take one by mouth daily   Yes [provider]  Esomeprazole Magnesium (NEXIUM 24HR PO) Take 22.3 mg by mouth daily.    [provider]  sertraline (ZOLOFT) 50 MG tablet TAKE 1 TABLETBY MOUTH EVERY DAY 10/21/16   Tower, [name], MD     Family History  Problem Relation Age of Onset  . Cancer Father        unknown type  . Hypertension Mother   . Colon cancer  Neg Hx   . Esophageal cancer Neg Hx   . Rectal cancer Neg Hx   . Stomach cancer Neg Hx     Social History   Social History  . Marital status: Married    Spouse name: N/A  . Number of children: N/A  . Years of education: N/A   Occupational History  . [employer] [place] History Main Topics  . Smoking status: Never Smoker  . Smokeless tobacco: Never Used  . Alcohol use No  . Drug use: No  . Sexual activity: Yes   Other Topics Concern  . None   Social History Narrative  . None     Review of Systems: A 12 point ROS discussed and pertinent positives are indicated in the HPI above.  All other systems are negative.  Review of Systems  Vital Signs: BP 120/63   Pulse 64   Temp 97.8 F (36.6 C) (Oral)   Resp 18   Ht _0  (1.6 m)   Wt 164 lb 6.4 oz (74.6 kg)   SpO2 99%   BMI 29.12 kg/m   Physical Exam  Constitutional: She is oriented to person, place, and time. She appears well-developed and well-nourished. No distress.  HENT:  Head: Normocephalic.  Mouth/Throat: Oropharynx is clear and moist.  Neck: Normal range of motion. No JVD present. No tracheal deviation  present.  Cardiovascular: Normal rate, regular rhythm and normal heart sounds.   Pulmonary/Chest: Effort normal and breath sounds normal. No respiratory distress.  Abdominal: Soft. She exhibits no distension. There is no tenderness.  Neurological: She is alert and oriented to person, place, and time.  Skin: Skin is warm and dry.  Psychiatric: She has a normal mood and affect. Judgment normal.    Mallampati Score:  MD Evaluation Airway: WNL Heart: WNL Abdomen: WNL Chest/ Lungs: WNL ASA  Classification: 2 Mallampati/Airway Score: One  Imaging: No results found.  Labs:  CBC:  Recent Labs  02/17/17 1044 03/12/17 0815 04/21/17 0939 05/26/17 0732  WBC 3.3* 3.5* 3.5* 2.8*  HGB 12.7 12.2 12.6 13.0  HCT 38.6 36.6 39.5 39.1  PLT 99.0 Platelet estimate appears normal.* 86.0* 101*  PENDING    COAGS: No results for input(s): INR, APTT in the last 8760 hours.  BMP:  Recent Labs  10/15/16 0902 02/05/17 0823 02/17/17 1044 04/21/17 0939  NA 141 140 142 140  K 4.3 4.2 4.2 3.8  CL 103 102 103 100*  CO2 33* 33* 33* 31  GLUCOSE 117* 109* 111* 107*  BUN _0 CALCIUM 10.8* 11.0* 11.2* 10.3  CREATININE 0.85 0.91 0.82 0.74  GFRNONAA  --   --   --  >60  GFRAA  --   --   --  >60    LIVER FUNCTION TESTS:  Recent Labs  10/15/16 0902 02/05/17 0823 02/17/17 1044 04/21/17 0939  BILITOT 0.6 0.5 0.5 0.7  AST 33 _1 ALT 37* 112* 21 23  ALKPHOS 107 154* 96 75  PROT 8.2 8.2 8.6* 8.0  ALBUMIN 3.8 3.6 3.7 3.6    TUMOR MARKERS: No results for input(s): AFPTM, CEA, CA199, CHROMGRNA in the last 8760 hours.  Assessment and Plan: Leukopenia and thrombocytopenia For CT guided bone marrow biopsy Labs reviewed. Risks and Benefits discussed with the patient including, but not limited to bleeding, infection, damage to adjacent structures or low yield requiring additional tests. All of the patient's questions were answered, patient is agreeable to proceed. Consent signed and in chart.    Thank you for this interesting consult.  I greatly enjoyed meeting Kathryn Weaver and look forward to participating in their care.  A copy of this report was sent to the requesting provider on this date.  Electronically Signed: Ascencion Dike, PA-C 05/26/2017, 8:21 AM   I spent a total of 20 minutes in face to face in clinical consultation, greater than 50% of which was counseling/coordinating care for bone marrow biopsy

## 2017-05-26 NOTE — Procedures (Signed)
Pre-procedure Diagnosis: thrombocytopenia Post-procedure Diagnosis: Same  Technically successful CT guided bone marrow aspiration and biopsy of left iliac crest.   Complications: None Immediate  EBL: None  SignedSandi Mariscal Pager: [phone] 05/26/2017, 9:53 AM

## 2017-05-26 NOTE — Discharge Instructions (Signed)
Bone Marrow Aspiration and Bone Marrow Biopsy, Adult, Care After This sheet gives you information about how to care for yourself after your procedure. Your health care provider may also give you more specific instructions. If you have problems or questions, contact your health care provider. What can I expect after the procedure? After the procedure, it is common to have:  Mild pain and tenderness.  Swelling.  Bruising.  Follow these instructions at home:  Take over-the-counter or prescription medicines only as told by your health care provider.  Do not take baths, swim, or use a hot tub until your health care provider approves. Ask if you can take a shower or have a sponge bath.  Follow instructions from your health care provider about how to take care of the puncture site. Make sure you: ? Wash your hands with soap and water before you change your bandage (dressing). If soap and water are not available, use hand sanitizer. ? Change your dressing as told by your health care provider. May remove dressing 24 hours post procedure and shower, keep site clean and dry. Replace bandaid as necessary.  Check your puncture siteevery day for signs of infection. Check for: ? More redness, swelling, or pain. ? More fluid or blood. ? Warmth. ? Pus or a bad smell.  Return to your normal activities as told by your health care provider. Ask your health care provider what activities are safe for you.  Do not drive for 24 hours if you were given a medicine to help you relax (sedative).  Keep all follow-up visits as told by your health care provider. This is important. Contact a health care provider if:  You have more redness, swelling, or pain around the puncture site.  You have more fluid or blood coming from the puncture site.  Your puncture site feels warm to the touch.  You have pus or a bad smell coming from the puncture site.  You have a fever.  Your pain is not controlled with  medicine. This information is not intended to replace advice given to you by your health care provider. Make sure you discuss any questions you have with your health care provider. Document Released: 05/17/2005 Document Revised: 05/17/2016 Document Reviewed: 04/10/2016 Elsevier Interactive Patient Education  2018 [employer].  Moderate Conscious Sedation, Adult, Care After These instructions provide you with information about caring for yourself after your procedure. Your health care provider may also give you more specific instructions. Your treatment has been planned according to current medical practices, but problems sometimes occur. Call your health care provider if you have any problems or questions after your procedure. What can I expect after the procedure? After your procedure, it is common:  To feel sleepy for several hours.  To feel clumsy and have poor balance for several hours.  To have poor judgment for several hours.  To vomit if you eat too soon.  Follow these instructions at home: For at least 24 hours after the procedure:   Do not: ? Participate in activities where you could fall or become injured. ? Drive. ? Use heavy machinery. ? Drink alcohol. ? Take sleeping pills or medicines that cause drowsiness. ? Make important decisions or sign legal documents. ? Take care of children on your own.  Rest. Eating and drinking  Follow the diet recommended by your health care provider.  If you vomit: ? Drink water, juice, or soup when you can drink without vomiting. ? Make sure you have little or  no nausea before eating solid foods. General instructions  Have a responsible adult stay with you until you are awake and alert.  Take over-the-counter and prescription medicines only as told by your health care provider.  If you smoke, do not smoke without supervision.  Keep all follow-up visits as told by your health care provider. This is important. Contact a  health care provider if:  You keep feeling nauseous or you keep vomiting.  You feel light-headed.  You develop a rash.  You have a fever. Get help right away if:  You have trouble breathing. This information is not intended to replace advice given to you by your health care provider. Make sure you discuss any questions you have with your health care provider. Document Released: 08/18/2013 Document Revised: 04/01/2016 Document Reviewed: 02/17/2016 Elsevier Interactive Patient Education  [employer].

## 2017-06-11 ENCOUNTER — Other Ambulatory Visit: Payer: Self-pay | Admitting: Family Medicine

## 2017-06-12 ENCOUNTER — Encounter (HOSPITAL_COMMUNITY): Payer: Self-pay

## 2017-06-13 ENCOUNTER — Encounter (HOSPITAL_COMMUNITY): Payer: Medicare Other | Attending: Oncology | Admitting: Oncology

## 2017-06-13 ENCOUNTER — Encounter (HOSPITAL_COMMUNITY): Payer: Self-pay | Admitting: Oncology

## 2017-06-13 VITALS — BP 125/70 | HR 56 | Resp 18 | Ht 63.0 in | Wt 166.9 lb

## 2017-06-13 DIAGNOSIS — D72819 Decreased white blood cell count, unspecified: Secondary | ICD-10-CM | POA: Diagnosis not present

## 2017-06-13 DIAGNOSIS — D696 Thrombocytopenia, unspecified: Secondary | ICD-10-CM | POA: Diagnosis not present

## 2017-06-13 DIAGNOSIS — E611 Iron deficiency: Secondary | ICD-10-CM | POA: Insufficient documentation

## 2017-06-13 NOTE — Progress Notes (Signed)
[facility] Follow up:    Tower, [name], MD [place] [address]   SUMMARY OF ONCOLOGIC HISTORY: Leukopenia and thrombocytopenia   INTERVAL HISTORY: Kathryn Weaver 71 y.o. female returns for follow up of her leukopenia and thrombocytopenia. Patient presented with her husband today for review her bone marrow biopsy results. She states that she has been feeling well since her last visit that she feels tired. CBC from 05/26/17 demonstrated WBC 2.8K, hemoglobin 13 g/dL, hematocrit 39.1%, MCV 98, platelet count 93K.   Patient Active Problem List   Diagnosis Date Noted  . Hyperglycemia 10/21/2016  . Somnolence, daytime 04/11/2016  . Fatigue 04/05/2016  . Contusion 03/27/2015  . Internal hemorrhoid 04/20/2014  . Iron deficiency 03/16/2014  . Encounter for Medicare annual wellness exam 03/01/2014  . Uterine prolapse 03/01/2014  . Esophageal dysphagia 01/05/2013  . Stress reaction 09/01/2012  . Hyperlipidemia 04/28/2012  . Routine general medical examination at a health care facility 04/20/2012  . Obesity 02/05/2012  . Leukocytopenia 10/23/2010  . Thrombocytopenia ([place]) 10/24/2009  . TRANSAMINASES, SERUM, ELEVATED 10/24/2009  . POSTMENOPAUSAL STATUS 08/09/2008  . DEPRESSION 08/05/2007  . Essential hypertension 08/05/2007  . GERD 04/20/2007  . DIVERTICULAR DISEASE 04/20/2007  . History of colonic polyps 04/13/2007    has No Known Allergies.  MEDICAL HISTORY: Past Medical History:  Diagnosis Date  . Adenomatous polyp of colon 03/2007  . Anemia    s/p GI bleed, hospital 5/08  . Depression   . Diverticulosis   . Endometriosis    uterine US 06/2003  . GERD (gastroesophageal reflux disease)   . Hiatal hernia    EGD 5/08- stricture, HH, gastritis, GERD  . Hypertension   . Hypertension   . Iron deficiency 03/16/2014    SURGICAL HISTORY: Past Surgical History:  Procedure Laterality Date  . cardiac CT  9/12   normal   . TUBAL  LIGATION      SOCIAL HISTORY: Social History   Social History  . Marital status: Married    Spouse name: N/A  . Number of children: N/A  . Years of education: N/A   Occupational History  . [place] History Main Topics  . Smoking status: Never Smoker  . Smokeless tobacco: Never Used  . Alcohol use No  . Drug use: No  . Sexual activity: Yes   Other Topics Concern  . Not on file   Social History Narrative  . No narrative on file    FAMILY HISTORY: Family History  Problem Relation Age of Onset  . Cancer Father        unknown type  . Hypertension Mother   . Colon cancer Neg Hx   . Esophageal cancer Neg Hx   . Rectal cancer Neg Hx   . Stomach cancer Neg Hx     Review of Systems  Constitutional: Positive for fatigue. Negative for appetite change, chills and fever.  HENT:   Negative for hearing loss, lump/mass, mouth sores, sore throat and tinnitus.   Eyes: Negative for eye problems and icterus.  Respiratory: Negative for chest tightness, cough, hemoptysis, shortness of breath and wheezing.   Cardiovascular: Negative for chest pain, leg swelling and palpitations.  Gastrointestinal: Negative for abdominal distention, abdominal pain, blood in stool, diarrhea, nausea and vomiting.  Endocrine: Negative.  Negative for hot flashes.  Genitourinary: Negative for difficulty urinating, frequency and hematuria.   Musculoskeletal: Negative for arthralgias and neck pain.  Skin: Negative for itching and rash.  Neurological: Negative for dizziness, headaches and speech difficulty.  Hematological: Negative for adenopathy. Does not bruise/bleed easily.  Psychiatric/Behavioral: Negative for confusion. The patient is not nervous/anxious.       PHYSICAL EXAMINATION    Vitals:   06/13/17 1029  BP: 125/70  Pulse: (!) 56  Resp: 18    Physical Exam  Constitutional: She is oriented to person, place, and time and well-developed, well-nourished, and in no  distress. No distress.  HENT:  Head: Normocephalic and atraumatic.  Mouth/Throat: No oropharyngeal exudate.  Eyes: Pupils are equal, round, and reactive to light. Conjunctivae are normal. No scleral icterus.  Neck: Normal range of motion. Neck supple. No JVD present.  Cardiovascular: Normal rate, regular rhythm and normal heart sounds.  Exam reveals no gallop and no friction rub.   No murmur heard. Pulmonary/Chest: Breath sounds normal. No respiratory distress. She has no wheezes. She has no rales.  Abdominal: Soft. Bowel sounds are normal. She exhibits no distension. There is no tenderness. There is no guarding.  Musculoskeletal: She exhibits no edema or tenderness.  Lymphadenopathy:    She has no cervical adenopathy.    She has no axillary adenopathy.       Right: No supraclavicular adenopathy present.       Left: No supraclavicular adenopathy present.  Neurological: She is alert and oriented to person, place, and time. No cranial nerve deficit.  Skin: Skin is warm and dry. No rash noted. No erythema. No pallor.  Psychiatric: Affect and judgment normal.    LABORATORY DATA:  CBC    Component Value Date/Time   WBC 2.8 (L) 05/26/2017 0732   RBC 3.99 05/26/2017 0732   HGB 13.0 05/26/2017 0732   HGB 13.6 02/21/2012 0925   HCT 39.1 05/26/2017 0732   HCT 42.0 02/21/2012 0925   PLT 93 (L) 05/26/2017 0732   PLT 106 (L) 02/21/2012 0925   MCV 98.0 05/26/2017 0732   MCV 98 02/21/2012 0925   MCH 32.6 05/26/2017 0732   MCHC 33.2 05/26/2017 0732   RDW 13.0 05/26/2017 0732   RDW 13.4 02/21/2012 0925   LYMPHSABS 1.5 04/21/2017 0939   LYMPHSABS 1.1 02/21/2012 0925   MONOABS 0.3 04/21/2017 0939   MONOABS 0.3 02/21/2012 0925   EOSABS 0.0 04/21/2017 0939   EOSABS 0.0 02/21/2012 0925   BASOSABS 0.0 04/21/2017 0939   BASOSABS 0.0 02/21/2012 0925    CMP     Component Value Date/Time   NA 140 04/21/2017 0939   NA 139 02/21/2012 0925   K 3.8 04/21/2017 0939   K 4.0 02/21/2012 0925    CL 100 (L) 04/21/2017 0939   CL 102 02/21/2012 0925   CO2 31 04/21/2017 0939   CO2 32 02/21/2012 0925   GLUCOSE 107 (H) 04/21/2017 0939   GLUCOSE 103 (H) 02/21/2012 0925   BUN 9 04/21/2017 0939   BUN 11 02/21/2012 0925   CREATININE 0.74 04/21/2017 0939   CREATININE 0.84 02/21/2012 0925   CALCIUM 10.3 04/21/2017 0939   CALCIUM 10.1 02/21/2012 0925   PROT 8.0 04/21/2017 0939   ALBUMIN 3.6 04/21/2017 0939   AST 28 04/21/2017 0939   ALT 23 04/21/2017 0939   ALKPHOS 75 04/21/2017 0939   BILITOT 0.7 04/21/2017 0939   GFRNONAA >60 04/21/2017 0939   GFRNONAA >60 02/21/2012 0925   GFRAA >60 04/21/2017 0939   GFRAA >60 02/21/2012 0925       PENDING LABS:   PATHOLOGY: Patient: Weaver, Kathryn H   Collected: 05/26/2017 Client: [facility] Accession: [id number] Received: 05/26/2017 John "Ronny Bacon, MD DOB: 02/04/1946 Age: 28 Gender: F Reported: 05/29/2017 [place] Patient Ph: [phone] MRN #: [id number] [place], [place] [address] Visit #: [id number] Chart #: Phone: [phone] Fax: CC: Twana First, MD BONE MARROW REPORT FINAL DIAGNOSIS Diagnosis Bone Marrow, Aspirate,Biopsy, and Clot, left iliac crest - HYPERCELLULAR BONE MARROW FOR AGE WITH TRILINEAGE HEMATOPOIESIS. - A MINOR POPULATION OF MONOCLONAL PLASMA CELLS. - SLIGHT LYMPHOCYTOSIS. - SEE COMMENT. PERIPHERAL BLOOD: - LEUKOPENIA. - THROMBOCYTOPENIA. Diagnosis Note The bone marrow is hypercellular for age with trilineage hematopoiesis and non specific myeloid changes. Significant dyspoiesis or increase in blastic cells is not identified. The plasma cells represent 3% of all cells in the aspirate associated with scattered small clusters in the clot and biopsy sections. Immunohistochemical stains show that the plasma cells are lambda light chain restricted. Despite limited findings, the changes are most consistent with early involvement by plasma cell dyscrasia/neoplasm. The lymphocytic component  is slightly increased in number (23%) and mostly consisting of small lymphoid cells. The lymphocytosis is primarily interstitial and only a rare minute lymphoid aggregate is seen in the core biopsy/clot sections. Flow cytometric analysis shows predominance of T lymphocytes with no abnormal phenotype. B cells represent a minor population with slight kappa light chain excess but no definite monoclonality or abnormal phenotype. Immunohistochemical stains were also performed and failed to show a significant increase in B cells or an apparently abnormal T cell lymphoid component. Hence the lymphocytosis is not necessarily a part of the plasma cell neoplastic process and is likely reactive in nature. Correlation with cytogenetic studies is recommended. (BNS:gt, Jun 06, 2017) Susanne Greenhouse MD Pathologist, Electronic Signature  Interpretation Bone Marrow Flow Cytometry - PREDOMINANCE OF T LYMPHOCYTES WITH NO ABNORMAL PHENOTYPE. - MINOR B-CELL POPULATION PRESENT. - SEE NOTE. Diagnosis Comment: Analysis of the lymphoid population shows predominance of T lymphocytes expressing pan T-cell antigens with no abnormal phenotype. B-cells represent a minor population (20% of lymphocytes) with expression of pan B-cell antigens associated with slight kappa light chain excess, but no definite monoclonality. In addition, an abnormal B-cell phenotype such as expression of CD5, CD10, or CD103 is not identified. The findings are not considered specific or diagnostic of a lymphoproliferative process. Clinical correlation is recommended. (BNS:ah 05/29/17) Susanne Greenhouse MD Pathologist, Electronic Signature    ASSESSMENT and THERAPY PLAN:  Chronic leukopenia and thrombocytopenia MGUS  I have reviewed patient's bone marrow results with her. She has no evidence of leukemia/lymphoma/MDS. She did have 3% plasma cells. Immunohistochemical stains show that the plasma cells are lambda light chain restricted. Despite limited  findings, the changes are most consistent with early involvement by plasma cell dyscrasia/neoplasm. Therefore on her next visit, I will perform the below workup for multiple myeloma. She likely has MGUS at this time. No bone pain. I have discussed with her that we will continue observation at this time of her MGUS.  RTC in 3 months for follow up.  Orders Placed This Encounter  Procedures  . CBC with Differential    Standing Status:   Future    Standing Expiration Date:   06/13/2018  . Comprehensive metabolic panel    Standing Status:   Future    Standing Expiration Date:   06/13/2018  . Multiple Myeloma Panel (SPEP&IFE w/QIG)    Standing Status:   Future    Standing Expiration Date:   06/13/2018  . Kappa/lambda light chains    Standing Status:   Future  Standing Expiration Date:   06/13/2018  . Beta 2 microglobuline, serum    Standing Status:   Future    Standing Expiration Date:   06/13/2018     All questions were answered. The patient knows to call the clinic with any problems, questions or concerns. We can certainly see the patient much sooner if necessary.  This note was electronically signed. Twana First, MD 06/13/2017

## 2017-06-16 LAB — CHROMOSOME ANALYSIS, BONE MARROW

## 2017-08-22 ENCOUNTER — Telehealth: Payer: Self-pay

## 2017-08-22 NOTE — Telephone Encounter (Signed)
Done and in IN box 

## 2017-08-22 NOTE — Telephone Encounter (Signed)
[employer] left v/m requesting cb to see if received clarification of dx packet sent on 08/18/17 or 08/21/17.

## 2017-08-25 NOTE — Telephone Encounter (Signed)
Completed formed faxed back as instructed.Kathryn Weaver

## 2017-08-25 NOTE — Telephone Encounter (Signed)
Left message on voicemail that information has been faxed back as requested.

## 2017-09-11 ENCOUNTER — Encounter (HOSPITAL_COMMUNITY): Payer: Medicare Other | Attending: Oncology

## 2017-09-11 ENCOUNTER — Other Ambulatory Visit (HOSPITAL_COMMUNITY): Payer: Medicare Other

## 2017-09-11 DIAGNOSIS — Z9851 Tubal ligation status: Secondary | ICD-10-CM | POA: Insufficient documentation

## 2017-09-11 DIAGNOSIS — D72819 Decreased white blood cell count, unspecified: Secondary | ICD-10-CM | POA: Insufficient documentation

## 2017-09-11 DIAGNOSIS — D72820 Lymphocytosis (symptomatic): Secondary | ICD-10-CM | POA: Insufficient documentation

## 2017-09-11 DIAGNOSIS — D696 Thrombocytopenia, unspecified: Secondary | ICD-10-CM | POA: Diagnosis present

## 2017-09-11 DIAGNOSIS — D472 Monoclonal gammopathy: Secondary | ICD-10-CM | POA: Insufficient documentation

## 2017-09-11 DIAGNOSIS — I10 Essential (primary) hypertension: Secondary | ICD-10-CM | POA: Diagnosis not present

## 2017-09-11 LAB — COMPREHENSIVE METABOLIC PANEL
ALBUMIN: 3.6 g/dL (ref 3.5–5.0)
ALK PHOS: 83 U/L (ref 38–126)
ALT: 31 U/L (ref 14–54)
AST: 31 U/L (ref 15–41)
Anion gap: 6 (ref 5–15)
BILIRUBIN TOTAL: 0.6 mg/dL (ref 0.3–1.2)
BUN: 12 mg/dL (ref 6–20)
CALCIUM: 10.3 mg/dL (ref 8.9–10.3)
CO2: 31 mmol/L (ref 22–32)
Chloride: 101 mmol/L (ref 101–111)
Creatinine, Ser: 0.8 mg/dL (ref 0.44–1.00)
GFR calc Af Amer: >60 mL/min (ref >60)
GFR calc non Af Amer: >60 mL/min (ref >60)
GLUCOSE: 104 mg/dL — AB (ref 65–99)
POTASSIUM: 3.8 mmol/L (ref 3.5–5.1)
SODIUM: 138 mmol/L (ref 135–145)
TOTAL PROTEIN: 7.9 g/dL (ref 6.5–8.1)

## 2017-09-11 LAB — CBC WITH DIFFERENTIAL/PLATELET
Basophils Absolute: 0 10*3/uL (ref 0.0–0.1)
Basophils Relative: 1 %
EOS ABS: 0 10*3/uL (ref 0.0–0.7)
Eosinophils Relative: 1 %
HEMATOCRIT: 38.2 % (ref 36.0–46.0)
HEMOGLOBIN: 12.5 g/dL (ref 12.0–15.0)
Lymphocytes Relative: 56 %
Lymphs Abs: 1.8 10*3/uL (ref 0.7–4.0)
MCH: 32.1 pg (ref 26.0–34.0)
MCHC: 32.7 g/dL (ref 30.0–36.0)
MCV: 98.2 fL (ref 78.0–100.0)
MONOS PCT: 10 %
Monocytes Absolute: 0.3 10*3/uL (ref 0.1–1.0)
NEUTROS ABS: 1 10*3/uL — AB (ref 1.7–7.7)
NEUTROS PCT: 32 %
Platelets: 94 10*3/uL — ABNORMAL LOW (ref 150–400)
RBC: 3.89 MIL/uL (ref 3.87–5.11)
RDW: 12.7 % (ref 11.5–15.5)
WBC: 3.2 10*3/uL — ABNORMAL LOW (ref 4.0–10.5)

## 2017-09-12 LAB — KAPPA/LAMBDA LIGHT CHAINS
KAPPA FREE LGHT CHN: 10.5 mg/L (ref 3.3–19.4)
KAPPA, LAMDA LIGHT CHAIN RATIO: 0.47 (ref 0.26–1.65)
LAMDA FREE LIGHT CHAINS: 22.4 mg/L (ref 5.7–26.3)

## 2017-09-12 LAB — BETA 2 MICROGLOBULIN, SERUM: BETA 2 MICROGLOBULIN: 1.9 mg/L (ref 0.6–2.4)

## 2017-09-15 LAB — MULTIPLE MYELOMA PANEL, SERUM
ALBUMIN/GLOB SERPL: 0.9 (ref 0.7–1.7)
ALPHA2 GLOB SERPL ELPH-MCNC: 0.8 g/dL (ref 0.4–1.0)
Albumin SerPl Elph-Mcnc: 3.4 g/dL (ref 2.9–4.4)
Alpha 1: 0.2 g/dL (ref 0.0–0.4)
B-GLOBULIN SERPL ELPH-MCNC: 0.9 g/dL (ref 0.7–1.3)
GAMMA GLOB SERPL ELPH-MCNC: 2.3 g/dL — AB (ref 0.4–1.8)
Globulin, Total: 4.2 g/dL — ABNORMAL HIGH (ref 2.2–3.9)
IGG (IMMUNOGLOBIN G), SERUM: 621 mg/dL — AB (ref 700–1600)
IGM (IMMUNOGLOBULIN M), SRM: 2480 mg/dL — AB (ref 26–217)
IgA: 53 mg/dL — ABNORMAL LOW (ref 87–352)
M PROTEIN SERPL ELPH-MCNC: 1.9 g/dL — AB
Total Protein ELP: 7.6 g/dL (ref 6.0–8.5)

## 2017-09-17 ENCOUNTER — Encounter (HOSPITAL_BASED_OUTPATIENT_CLINIC_OR_DEPARTMENT_OTHER): Payer: Medicare Other | Admitting: Oncology

## 2017-09-17 ENCOUNTER — Ambulatory Visit (HOSPITAL_COMMUNITY): Payer: Medicare Other

## 2017-09-17 ENCOUNTER — Encounter (HOSPITAL_COMMUNITY): Payer: Self-pay

## 2017-09-17 ENCOUNTER — Other Ambulatory Visit: Payer: Self-pay

## 2017-09-17 VITALS — BP 146/90 | HR 57 | Temp 97.7°F | Resp 18 | Wt 174.2 lb

## 2017-09-17 DIAGNOSIS — D72819 Decreased white blood cell count, unspecified: Secondary | ICD-10-CM

## 2017-09-17 DIAGNOSIS — D472 Monoclonal gammopathy: Secondary | ICD-10-CM

## 2017-09-17 DIAGNOSIS — D696 Thrombocytopenia, unspecified: Secondary | ICD-10-CM

## 2017-09-17 DIAGNOSIS — D708 Other neutropenia: Secondary | ICD-10-CM

## 2017-09-17 NOTE — Patient Instructions (Signed)
[facility]  Discharge Instructions: You saw Dr. Zhou today.  _______________________________________________________________  Thank you for choosing [facility] to provide your oncology and hematology care.  To afford each patient quality time with our providers, please arrive at least 15 minutes before your scheduled appointment.  You need to re-schedule your appointment if you arrive 10 or more minutes late.  We strive to give you quality time with our providers, and arriving late affects you and other patients whose appointments are after yours.  Also, if you no show three or more times for appointments you may be dismissed from the clinic.  Again, thank you for choosing  [facility]. Our hope is that these requests will allow you access to exceptional care and in a timely manner. _______________________________________________________________  If you have questions after your visit, please contact our office at [phone] between the hours of 8:30 a.m. and 5:00 p.m. Voicemails left after 4:30 p.m. will not be returned until the following business day. _______________________________________________________________  For prescription refill requests, have your pharmacy contact our office. _______________________________________________________________  Recommendations made by the consultant and any test results will be sent to your referring physician. _______________________________________________________________ 

## 2017-09-17 NOTE — Progress Notes (Signed)
[facility] Follow up:    Tower, Kathryn Fanny, MD [place] [address]   SUMMARY OF ONCOLOGIC HISTORY: Leukopenia and thrombocytopenia MGUS with 3% plasma cells in BM  INTERVAL HISTORY: Kathryn Weaver 71 y.o. female returns for follow up of her leukopenia and thrombocytopenia, and MGUS  She has no complaints today and states that she feels very well.  Her previous fatigue has resolved on its own.  She denies any recent infections or possible easy bleeding or bruising.  She denies any bone pain.  She denies any chest pain, shortness of breath, abdominal pain, focal weakness.  There is no changes her appetite or weight.  Patient Active Problem List   Diagnosis Date Noted  . Hyperglycemia 10/21/2016  . Somnolence, daytime 04/11/2016  . Fatigue 04/05/2016  . Contusion 03/27/2015  . Internal hemorrhoid 04/20/2014  . Iron deficiency 03/16/2014  . Encounter for Medicare annual wellness exam 03/01/2014  . Uterine prolapse 03/01/2014  . Esophageal dysphagia 01/05/2013  . Stress reaction 09/01/2012  . Hyperlipidemia 04/28/2012  . Routine general medical examination at a health care facility 04/20/2012  . Obesity 02/05/2012  . Leukocytopenia 10/23/2010  . Thrombocytopenia ([place]) 10/24/2009  . TRANSAMINASES, SERUM, ELEVATED 10/24/2009  . POSTMENOPAUSAL STATUS 08/09/2008  . DEPRESSION 08/05/2007  . Essential hypertension 08/05/2007  . GERD 04/20/2007  . DIVERTICULAR DISEASE 04/20/2007  . History of colonic polyps 04/13/2007    has No Known Allergies.  MEDICAL HISTORY: Past Medical History:  Diagnosis Date  . Adenomatous polyp of colon 03/2007  . Anemia    s/p GI bleed, hospital 5/08  . Depression   . Diverticulosis   . Endometriosis    uterine US 06/2003  . GERD (gastroesophageal reflux disease)   . Hiatal hernia    EGD 5/08- stricture, HH, gastritis, GERD  . Hypertension   . Hypertension   . Iron deficiency 03/16/2014    SURGICAL  HISTORY: Past Surgical History:  Procedure Laterality Date  . cardiac CT  9/12   normal   . TUBAL LIGATION      SOCIAL HISTORY: Social History   Socioeconomic History  . Marital status: Married    Spouse name: Not on file  . Number of children: Not on file  . Years of education: Not on file  . Highest education level: Not on file  Social Needs  . Financial resource strain: Not on file  . Food insecurity - worry: Not on file  . Food insecurity - inability: Not on file  . Transportation needs - medical: Not on file  . Transportation needs - non-medical: Not on file  Occupational History  . Occupation: [employer]  Tobacco Use  . Smoking status: Never Smoker  . Smokeless tobacco: Never Used  Substance and Sexual Activity  . Alcohol use: No    Alcohol/week: 0.0 oz  . Drug use: No  . Sexual activity: Yes  Other Topics Concern  . Not on file  Social History Narrative  . Not on file    FAMILY HISTORY: Family History  Problem Relation Age of Onset  . Cancer Father        unknown type  . Hypertension Mother   . Colon cancer Neg Hx   . Esophageal cancer Neg Hx   . Rectal cancer Neg Hx   . Stomach cancer Neg Hx     Review of Systems  Constitutional: Negative for appetite change, chills, fatigue and fever.  HENT:  Negative for hearing loss, lump/mass, mouth sores, sore throat and tinnitus.   Eyes: Negative for eye problems and icterus.  Respiratory: Negative for chest tightness, cough, hemoptysis, shortness of breath and wheezing.   Cardiovascular: Negative for chest pain, leg swelling and palpitations.  Gastrointestinal: Negative for abdominal distention, abdominal pain, blood in stool, diarrhea, nausea and vomiting.  Endocrine: Negative.  Negative for hot flashes.  Genitourinary: Negative for difficulty urinating, frequency and hematuria.   Musculoskeletal: Negative for arthralgias and neck pain.  Skin: Negative for itching and rash.  Neurological:  Negative for dizziness, headaches and speech difficulty.  Hematological: Negative for adenopathy. Does not bruise/bleed easily.  Psychiatric/Behavioral: Negative for confusion. The patient is not nervous/anxious.       PHYSICAL EXAMINATION    There were no vitals filed for this visit.  Physical Exam  Constitutional: She is oriented to person, place, and time and well-developed, well-nourished, and in no distress. No distress.  HENT:  Head: Normocephalic and atraumatic.  Mouth/Throat: No oropharyngeal exudate.  Eyes: Conjunctivae are normal. Pupils are equal, round, and reactive to light. No scleral icterus.  Neck: Normal range of motion. Neck supple. No JVD present.  Cardiovascular: Normal rate, regular rhythm and normal heart sounds. Exam reveals no gallop and no friction rub.  No murmur heard. Pulmonary/Chest: Breath sounds normal. No respiratory distress. She has no wheezes. She has no rales.  Abdominal: Soft. Bowel sounds are normal. She exhibits no distension. There is no tenderness. There is no guarding.  Musculoskeletal: She exhibits no edema or tenderness.  Lymphadenopathy:    She has no cervical adenopathy.    She has no axillary adenopathy.       Right: No supraclavicular adenopathy present.       Left: No supraclavicular adenopathy present.  Neurological: She is alert and oriented to person, place, and time. No cranial nerve deficit.  Skin: Skin is warm and dry. No rash noted. No erythema. No pallor.  Psychiatric: Affect and judgment normal.    LABORATORY DATA:  CBC    Component Value Date/Time   WBC 3.2 (L) 09/11/2017 0836   RBC 3.89 09/11/2017 0836   HGB 12.5 09/11/2017 0836   HGB 13.6 02/21/2012 0925   HCT 38.2 09/11/2017 0836   HCT 42.0 02/21/2012 0925   PLT 94 (L) 09/11/2017 0836   PLT 106 (L) 02/21/2012 0925   MCV 98.2 09/11/2017 0836   MCV 98 02/21/2012 0925   MCH 32.1 09/11/2017 0836   MCHC 32.7 09/11/2017 0836   RDW 12.7 09/11/2017 0836   RDW  13.4 02/21/2012 0925   LYMPHSABS 1.8 09/11/2017 0836   LYMPHSABS 1.1 02/21/2012 0925   MONOABS 0.3 09/11/2017 0836   MONOABS 0.3 02/21/2012 0925   EOSABS 0.0 09/11/2017 0836   EOSABS 0.0 02/21/2012 0925   BASOSABS 0.0 09/11/2017 0836   BASOSABS 0.0 02/21/2012 0925    CMP     Component Value Date/Time   NA 138 09/11/2017 0836   NA 139 02/21/2012 0925   K 3.8 09/11/2017 0836   K 4.0 02/21/2012 0925   CL 101 09/11/2017 0836   CL 102 02/21/2012 0925   CO2 31 09/11/2017 0836   CO2 32 02/21/2012 0925   GLUCOSE 104 (H) 09/11/2017 0836   GLUCOSE 103 (H) 02/21/2012 0925   BUN 12 09/11/2017 0836   BUN 11 02/21/2012 0925   CREATININE 0.80 09/11/2017 0836   CREATININE 0.84 02/21/2012 0925   CALCIUM 10.3 09/11/2017 0836   CALCIUM 10.1 02/21/2012 0925  PROT 7.9 09/11/2017 0836   ALBUMIN 3.6 09/11/2017 0836   AST 31 09/11/2017 0836   ALT 31 09/11/2017 0836   ALKPHOS 83 09/11/2017 0836   BILITOT 0.6 09/11/2017 0836   GFRNONAA >60 09/11/2017 0836   GFRNONAA >60 02/21/2012 0925   GFRAA >60 09/11/2017 0836   GFRAA >60 02/21/2012 0925       PENDING LABS:   PATHOLOGY: Patient: Early Chars Collected: 05/26/2017 Client: [facility] Accession: [id number] Received: 05/26/2017 John "Ronny Bacon, MD DOB: Mar 09, 1946 Age: 57 Gender: F Reported: 05/29/2017 [place] Patient Ph: [phone] MRN #: [id number] [place], [place] [address] Visit #: [id number] Chart #: Phone: [phone] Fax: CC: Twana First, MD BONE MARROW REPORT FINAL DIAGNOSIS Diagnosis Bone Marrow, Aspirate,Biopsy, and Clot, left iliac crest - HYPERCELLULAR BONE MARROW FOR AGE WITH TRILINEAGE HEMATOPOIESIS. - A MINOR POPULATION OF MONOCLONAL PLASMA CELLS. - SLIGHT LYMPHOCYTOSIS. - SEE COMMENT. PERIPHERAL BLOOD: - LEUKOPENIA. - THROMBOCYTOPENIA. Diagnosis Note The bone marrow is hypercellular for age with trilineage hematopoiesis and non specific myeloid changes. Significant dyspoiesis  or increase in blastic cells is not identified. The plasma cells represent 3% of all cells in the aspirate associated with scattered small clusters in the clot and biopsy sections. Immunohistochemical stains show that the plasma cells are lambda light chain restricted. Despite limited findings, the changes are most consistent with early involvement by plasma cell dyscrasia/neoplasm. The lymphocytic component is slightly increased in number (23%) and mostly consisting of small lymphoid cells. The lymphocytosis is primarily interstitial and only a rare minute lymphoid aggregate is seen in the core biopsy/clot sections. Flow cytometric analysis shows predominance of T lymphocytes with no abnormal phenotype. B cells represent a minor population with slight kappa light chain excess but no definite monoclonality or abnormal phenotype. Immunohistochemical stains were also performed and failed to show a significant increase in B cells or an apparently abnormal T cell lymphoid component. Hence the lymphocytosis is not necessarily a part of the plasma cell neoplastic process and is likely reactive in nature. Correlation with cytogenetic studies is recommended. (BNS:gt, 06-17-2017) Susanne Greenhouse MD Pathologist, Electronic Signature  Interpretation Bone Marrow Flow Cytometry - PREDOMINANCE OF T LYMPHOCYTES WITH NO ABNORMAL PHENOTYPE. - MINOR B-CELL POPULATION PRESENT. - SEE NOTE. Diagnosis Comment: Analysis of the lymphoid population shows predominance of T lymphocytes expressing pan T-cell antigens with no abnormal phenotype. B-cells represent a minor population (20% of lymphocytes) with expression of pan B-cell antigens associated with slight kappa light chain excess, but no definite monoclonality. In addition, an abnormal B-cell phenotype such as expression of CD5, CD10, or CD103 is not identified. The findings are not considered specific or diagnostic of a lymphoproliferative process. Clinical correlation  is recommended. (BNS:ah 05/29/17) Susanne Greenhouse MD Pathologist, Electronic Signature    ASSESSMENT and THERAPY PLAN:  Chronic leukopenia and thrombocytopenia MGUS with IgM monoclonal protein with lambda light chains and 3% plasma cells on bone marrow.  -Labs reviewed with the patient today -SPEP revealed an IgM monoclonal protein with lambda light chains. Bone marrow biopsy was negative for excess lymphoplasmacytoid cells as seen in [place] macroglobulinemia. I believe she has an IgM MGUS at this time, which we will continue to observe. Kappa/lambda light chains ratio normal and beta2 microglobulin normal. -Leukopenia and thrombocytopenia stable. -RTC in 4 months for follow up with the below labs.    Orders Placed This Encounter  Procedures  . CBC with Differential    Standing Status:   Future    Standing Expiration Date:  09/17/2018  . Comprehensive metabolic panel    Standing Status:   Future    Standing Expiration Date:   09/17/2018  . Kappa/lambda light chains    Standing Status:   Future    Standing Expiration Date:   09/17/2018  . Multiple Myeloma Panel (SPEP&IFE w/QIG)    Standing Status:   Future    Standing Expiration Date:   09/17/2018     All questions were answered. The patient knows to call the clinic with any problems, questions or concerns. We can certainly see the patient much sooner if necessary.  This note was electronically signed. Twana First, MD 09/17/2017

## 2017-10-17 ENCOUNTER — Other Ambulatory Visit: Payer: Self-pay | Admitting: *Deleted

## 2017-10-17 MED ORDER — ATORVASTATIN CALCIUM 10 MG PO TABS: 10.0000 mg | ORAL_TABLET | Freq: Every day | ORAL | 0 refills | Status: DC

## 2017-12-02 ENCOUNTER — Other Ambulatory Visit: Payer: Self-pay | Admitting: Sports Medicine

## 2017-12-02 ENCOUNTER — Ambulatory Visit: Payer: Medicare Other | Admitting: Sports Medicine

## 2017-12-02 ENCOUNTER — Encounter: Payer: Self-pay | Admitting: Sports Medicine

## 2017-12-02 ENCOUNTER — Ambulatory Visit (INDEPENDENT_AMBULATORY_CARE_PROVIDER_SITE_OTHER): Payer: Medicare Other

## 2017-12-02 VITALS — BP 147/75 | HR 56

## 2017-12-02 DIAGNOSIS — L84 Corns and callosities: Secondary | ICD-10-CM | POA: Diagnosis not present

## 2017-12-02 DIAGNOSIS — M2042 Other hammer toe(s) (acquired), left foot: Secondary | ICD-10-CM | POA: Diagnosis not present

## 2017-12-02 DIAGNOSIS — M79675 Pain in left toe(s): Secondary | ICD-10-CM | POA: Diagnosis not present

## 2017-12-02 DIAGNOSIS — R52 Pain, unspecified: Secondary | ICD-10-CM

## 2017-12-02 DIAGNOSIS — M2040 Other hammer toe(s) (acquired), unspecified foot: Secondary | ICD-10-CM

## 2017-12-02 HISTORY — PX: HAMMER TOE SURGERY: SHX385

## 2017-12-02 NOTE — Progress Notes (Signed)
   Subjective:    Patient ID: Kathryn Weaver, female    DOB: 1946-07-25, 72 y.o.   MRN: [id number]  HPI    Review of Systems  All other systems reviewed and are negative.      Objective:   Physical Exam        Assessment & Plan:

## 2017-12-02 NOTE — Progress Notes (Signed)
Subjective: Kathryn Weaver is a 72 y.o. female patient who presents to office for evaluation of Left>Right foot pain. Patient complains of progressive pain especially over the last few months over the 2nd toe with shoes. Patient has tried wider shoes and pads with no relief in symptoms. Patient denies any other pedal complaints.   Review of Systems  All other systems reviewed and are negative.   Patient Active Problem List   Diagnosis Date Noted  . MGUS (monoclonal gammopathy of unknown significance) 09/17/2017  . Hyperglycemia 10/21/2016  . Somnolence, daytime 04/11/2016  . Fatigue 04/05/2016  . Contusion 03/27/2015  . Internal hemorrhoid 04/20/2014  . Iron deficiency 03/16/2014  . Encounter for Medicare annual wellness exam 03/01/2014  . Uterine prolapse 03/01/2014  . Esophageal dysphagia 01/05/2013  . Stress reaction 09/01/2012  . Hyperlipidemia 04/28/2012  . Routine general medical examination at a health care facility 04/20/2012  . Obesity 02/05/2012  . Leukocytopenia 10/23/2010  . Thrombocytopenia ([place]) 10/24/2009  . TRANSAMINASES, SERUM, ELEVATED 10/24/2009  . POSTMENOPAUSAL STATUS 08/09/2008  . DEPRESSION 08/05/2007  . Essential hypertension 08/05/2007  . GERD 04/20/2007  . DIVERTICULAR DISEASE 04/20/2007  . History of colonic polyps 04/13/2007    Current Outpatient Medications on File Prior to Visit  Medication Sig Dispense Refill  . atorvastatin (LIPITOR) 10 MG tablet Take 1 tablet (10 mg total) by mouth daily. **need to schedule an appt with doctor before future refills are given 90 tablet 0  . cholecalciferol (VITAMIN D) 1000 units tablet Take 5,000 Units by mouth daily.    . Esomeprazole Magnesium (NEXIUM 24HR PO) Take 22.3 mg by mouth daily.    . hydrochlorothiazide (HYDRODIURIL) 25 MG tablet Take 1 tablet (25 mg total) by mouth daily. 90 tablet 3  . Multiple Vitamins-Minerals (CENTRUM PO) Take 1 tablet by mouth 2 (two) times a week. Take one by mouth daily    .  sertraline (ZOLOFT) 50 MG tablet TAKE 1 TABLETBY MOUTH EVERY DAY (Patient not taking: Reported on 12/02/2017) 90 tablet 3  . [DISCONTINUED] ranitidine (ZANTAC) 150 MG capsule Take 1 capsule (150 mg total) by mouth 2 (two) times daily. For 3 months 60 capsule 2   No current facility-administered medications on file prior to visit.     No Known Allergies  Objective:  General: Alert and oriented x3 in no acute distress  Dermatology: Small hyperkeratotic lesion overlying 2-5 PIPJ dorsally bilateral with left 2nd most involved. No open lesions bilateral lower extremities, no webspace macerations, no ecchymosis bilateral, all nails x 10 are well manicured.  Vascular: Dorsalis Pedis and Posterior Tibial pedal pulses 1/4, Capillary Fill Time 3 seconds,(+) pedal hair growth bilateral, no edema bilateral lower extremities, Temperature gradient within normal limits.  Neurology: Johney Maine sensation intact via light touch bilateral.  Musculoskeletal: Semi-flexible hammertoes 2-5 with Mild tenderness with palpation at 2nd PIPJ on left. Ankle, Subtalar, Midtarsal, and MTPJ joint range of motion is within normal limits, there is no 1st ray hypermobility noted bilateral, No bunion deformity noted bilateral. No pain with calf compression bilateral.  Strength within normal limits in all groups bilateral.   Gait: Unassisted, Non-antalgic.  Xrays  Left Foot    Impression:Hammertoe digital contracture with medial deviation at MTPJ on second. No other acute findings        Assessment and Plan: Problem List Items Addressed This Visit    None    Visit Diagnoses    Hammer toe, unspecified laterality    -  Primary   Left 2nd  the worse   Corn of toe       Toe pain, left         -Complete examination performed -Xrays reviewed -Discussed treatement options -Rx toe cap -Patient opt for surgical management. Consent obtained for left 2nd hammertoe repair with kwire and excision of corn. Pre and Post op course  explained. Risks, benefits, alternatives explained. No guarantees given or implied. Surgical booking slip submitted and provided patient with Surgical packet and info for [facility]. -Dispensed CAM Walker to use post op -Patient to return to office after surgery or sooner if condition worsens.  Landis Martins, DPM

## 2017-12-02 NOTE — Patient Instructions (Addendum)
Hammer Toe Hammer toe is a change in the shape (a deformity) of your second, third, or fourth toe. The deformity causes the middle joint of your toe to stay bent. This causes pain, especially when you are wearing shoes. Hammer toe starts gradually. At first, the toe can be straightened. Gradually over time, the deformity becomes stiff and permanent. Early treatments to keep the toe straight may relieve pain. As the deformity becomes stiff and permanent, surgery may be needed to straighten the toe. What are the causes? Hammer toe is caused by abnormal bending of the toe joint that is closest to your foot. It happens gradually over time. This pulls on the muscles and connections (tendons) of the toe joint, making them weak and stiff. It is often related to wearing shoes that are too short or narrow and do not let your toes straighten. What increases the risk? You may be at greater risk for hammer toe if you:  Are female.  Are older.  Wear shoes that are too small.  Wear high-heeled shoes that pinch your toes.  Are a ballet dancer.  Have a second toe that is longer than your big toe (first toe).  Injure your foot or toe.  Have arthritis.  Have a family history of hammer toe.  Have a nerve or muscle disorder.  What are the signs or symptoms? The main symptoms of this condition are pain and deformity of the toe. The pain is worse when wearing shoes, walking, or running. Other symptoms may include:  Corns or calluses over the bent part of the toe or between the toes.  Redness and a burning feeling on the toe.  An open sore that forms on the top of the toe.  Not being able to straighten the toe.  How is this diagnosed? This condition is diagnosed based on your symptoms and a physical exam. During the exam, your health care provider will try to straighten your toe to see how stiff the deformity is. You may also have tests, such as:  A blood test to check for rheumatoid  arthritis.  An X-ray to show how severe the deformity is.  How is this treated? Treatment for this condition will depend on how stiff the deformity is. Surgery is often needed. However, sometimes a hammer toe can be straightened without surgery. Treatments that do not involve surgery include:  Taping the toe into a straightened position.  Using pads and cushions to protect the toe (orthotics).  Wearing shoes that provide enough room for the toes.  Doing toe-stretching exercises at home.  Taking an NSAID to reduce pain and swelling.  If these treatments do not help or the toe cannot be straightened, surgery is the next option. The most common surgeries used to straighten a hammer toe include:  Arthroplasty. In this procedure, part of the joint is removed, and that allows the toe to straighten.  Fusion. In this procedure, cartilage between the two bones of the joint is taken out and the bones are fused together into one longer bone.  Implantation. In this procedure, part of the bone is removed and replaced with an implant to let the toe move again.  Flexor tendon transfer. In this procedure, the tendons that curl the toes down (flexor tendons) are repositioned.  Follow these instructions at home:  Take over-the-counter and prescription medicines only as told by your health care provider.  Do toe straightening and stretching exercises as told by your health care provider.  Keep all   follow-up visits as told by your health care provider. This is important. How is this prevented?  Wear shoes that give your toes enough room and do not cause pain.  Do not wear high-heeled shoes. Contact a health care provider if:  Your pain gets worse.  Your toe becomes red or swollen.  You develop an open sore on your toe. This information is not intended to replace advice given to you by your health care provider. Make sure you discuss any questions you have with your health care  provider. Document Released: 10/25/2000 Document Revised: 05/17/2016 Document Reviewed: 02/21/2016 Elsevier Interactive Patient Education  2018 [employer] Pre-Operative Instructions  Congratulations, you have decided to take an important step towards improving your quality of life.  You can be assured that the doctors and staff at [facility] will be with you every step of the way.  Here are some important things you should know:  1. Plan to be at the surgery center/hospital at least 1 (one) hour prior to your scheduled time, unless otherwise directed by the surgical center/hospital staff.  You must have a responsible adult accompany you, remain during the surgery and drive you home.  Make sure you have directions to the surgical center/hospital to ensure you arrive on time. 2. If you are having surgery at [facility] or  hospitals, you will need a copy of your medical history and physical form from your family physician within one month prior to the date of surgery. We will give you a form for your primary physician to complete.  3. We make every effort to accommodate the date you request for surgery.  However, there are times where surgery dates or times have to be moved.  We will contact you as soon as possible if a change in schedule is required.   4. No aspirin/ibuprofen for one week before surgery.  If you are on aspirin, any non-steroidal anti-inflammatory medications (Mobic, Aleve, Ibuprofen) should not be taken seven (7) days prior to your surgery.  You make take Tylenol for pain prior to surgery.  5. Medications - If you are taking daily heart and blood pressure medications, seizure, reflux, allergy, asthma, anxiety, pain or diabetes medications, make sure you notify the surgery center/hospital before the day of surgery so they can tell you which medications you should take or avoid the day of surgery. 6. No food or drink after midnight the night before surgery unless  directed otherwise by surgical center/hospital staff. 7. No alcoholic beverages 24-hours prior to surgery.  No smoking 24-hours prior or 24-hours after surgery. 8. Wear loose pants or shorts. They should be loose enough to fit over bandages, boots, and casts. 9. Don't wear slip-on shoes. Sneakers are preferred. 10. Bring your boot with you to the surgery center/hospital.  Also bring crutches or a walker if your physician has prescribed it for you.  If you do not have this equipment, it will be provided for you after surgery. 11. If you have not been contacted by the surgery center/hospital by the day before your surgery, call to confirm the date and time of your surgery. 12. Leave-time from work may vary depending on the type of surgery you have.  Appropriate arrangements should be made prior to surgery with your employer. 13. Prescriptions will be provided immediately following surgery by your doctor.  Fill these as soon as possible after surgery and take the medication as directed. Pain medications will not be refilled on weekends and   must be approved by the doctor. 14. Remove nail polish on the operative foot and avoid getting pedicures prior to surgery. 15. Wash the night before surgery.  The night before surgery wash the foot and leg well with water and the antibacterial soap provided. Be sure to pay special attention to beneath the toenails and in between the toes.  Wash for at least three (3) minutes. Rinse thoroughly with water and dry well with a towel.  Perform this wash unless told not to do so by your physician.  Enclosed: 1 Ice pack (please put in freezer the night before surgery)   1 Hibiclens skin cleaner   Pre-op instructions  If you have any questions regarding the instructions, please do not hesitate to call our office.  [place]: [address], [place], [place] [address] -- [phone]  [place]: [address] [place], [place] [address] -- [phone]  [place]: [address]  [place], [place] [address] -- [phone]  [place]: [address], [address], [place], [place] [address] -- [phone]  Website: [email or web address] 

## 2017-12-15 ENCOUNTER — Encounter: Payer: Self-pay | Admitting: Sports Medicine

## 2017-12-15 DIAGNOSIS — D2372 Other benign neoplasm of skin of left lower limb, including hip: Secondary | ICD-10-CM | POA: Diagnosis not present

## 2017-12-15 DIAGNOSIS — M2042 Other hammer toe(s) (acquired), left foot: Secondary | ICD-10-CM | POA: Diagnosis not present

## 2017-12-16 ENCOUNTER — Telehealth: Payer: Self-pay | Admitting: Sports Medicine

## 2017-12-16 NOTE — Telephone Encounter (Signed)
Post op check phone call made to patient. Patient states that she is doing well. Sitting in front of TV with her foot elevated. Patient denies any current pain in the foot and reports that she is glad that she got all of this behind her. Patient denies any other symptoms at this time. I reminded patient to continue to rest, ice, elevate, and take Rx meds as written. Patient to follow up as scheduled for continued post op care. -Dr. Cannon Kettle

## 2017-12-17 ENCOUNTER — Other Ambulatory Visit: Payer: Self-pay | Admitting: Family Medicine

## 2017-12-17 NOTE — Telephone Encounter (Signed)
No recent or future appts., please advise  

## 2017-12-17 NOTE — Telephone Encounter (Signed)
Please schedule f/u or PE in April and refill until then

## 2017-12-18 NOTE — Telephone Encounter (Signed)
appt scheduled and med refilled 

## 2017-12-23 ENCOUNTER — Ambulatory Visit (INDEPENDENT_AMBULATORY_CARE_PROVIDER_SITE_OTHER): Payer: Medicare Other | Admitting: Sports Medicine

## 2017-12-23 ENCOUNTER — Encounter: Payer: Self-pay | Admitting: Sports Medicine

## 2017-12-23 ENCOUNTER — Ambulatory Visit (INDEPENDENT_AMBULATORY_CARE_PROVIDER_SITE_OTHER): Payer: Medicare Other

## 2017-12-23 VITALS — BP 110/64 | HR 59

## 2017-12-23 DIAGNOSIS — Z9889 Other specified postprocedural states: Secondary | ICD-10-CM

## 2017-12-23 DIAGNOSIS — M2042 Other hammer toe(s) (acquired), left foot: Secondary | ICD-10-CM

## 2017-12-23 DIAGNOSIS — M79675 Pain in left toe(s): Secondary | ICD-10-CM

## 2017-12-23 DIAGNOSIS — L84 Corns and callosities: Secondary | ICD-10-CM

## 2017-12-23 NOTE — Progress Notes (Signed)
Subjective: SUMMAR MCGLOTHLIN is a 72 y.o. female patient seen today in office for POV #1 (DOS 12-15-17), S/P Left 2nd hammertoe repair with excision of corn and kwire. Patient denies pain at surgical site, denies calf pain, denies headache, chest pain, shortness of breath, nausea, vomiting, fever, or chills. Patient states that she is doing well and is only had to take 1 pain pill. No other issues noted.   Patient Active Problem List   Diagnosis Date Noted  . MGUS (monoclonal gammopathy of unknown significance) 09/17/2017  . Hyperglycemia 10/21/2016  . Somnolence, daytime 04/11/2016  . Fatigue 04/05/2016  . Contusion 03/27/2015  . Internal hemorrhoid 04/20/2014  . Iron deficiency 03/16/2014  . Encounter for Medicare annual wellness exam 03/01/2014  . Uterine prolapse 03/01/2014  . Esophageal dysphagia 01/05/2013  . Stress reaction 09/01/2012  . Hyperlipidemia 04/28/2012  . Routine general medical examination at a health care facility 04/20/2012  . Obesity 02/05/2012  . Leukocytopenia 10/23/2010  . Thrombocytopenia ([place]) 10/24/2009  . TRANSAMINASES, SERUM, ELEVATED 10/24/2009  . POSTMENOPAUSAL STATUS 08/09/2008  . DEPRESSION 08/05/2007  . Essential hypertension 08/05/2007  . GERD 04/20/2007  . DIVERTICULAR DISEASE 04/20/2007  . History of colonic polyps 04/13/2007    Current Outpatient Medications on File Prior to Visit  Medication Sig Dispense Refill  . atorvastatin (LIPITOR) 10 MG tablet Take 1 tablet (10 mg total) by mouth daily. **need to schedule an appt with doctor before future refills are given 90 tablet 0  . cholecalciferol (VITAMIN D) 1000 units tablet Take 5,000 Units by mouth daily.    . Esomeprazole Magnesium (NEXIUM 24HR PO) Take 22.3 mg by mouth daily.    . hydrochlorothiazide (HYDRODIURIL) 25 MG tablet TAKE 1 TABLET BY MOUTH EVERY DAY 90 tablet 0  . Multiple Vitamins-Minerals (CENTRUM PO) Take 1 tablet by mouth 2 (two) times a week. Take one by mouth daily    .  sertraline (ZOLOFT) 50 MG tablet TAKE 1 TABLETBY MOUTH EVERY DAY (Patient not taking: Reported on 12/02/2017) 90 tablet 3  . [DISCONTINUED] ranitidine (ZANTAC) 150 MG capsule Take 1 capsule (150 mg total) by mouth 2 (two) times daily. For 3 months 60 capsule 2   No current facility-administered medications on file prior to visit.     No Known Allergies  Objective: There were no vitals filed for this visit.  General: No acute distress, AAOx3  Left foot: Sutures and Kwire intact with no gapping or dehiscence at surgical site, mild swelling to left 2nd toe, no erythema, no warmth, no drainage, no signs of infection noted, Capillary fill time <3 seconds in all digits, gross sensation present via light touch to left foot. No pain or crepitation with range of motion left.  No pain with calf compression.   Post Op Xray, Left foot: Kwire and 2nd toe in excellent alignment and position. Arthrodesis site healing. Soft tissue swelling within normal limits for post op status.   Assessment and Plan:  Problem List Items Addressed This Visit    None    Visit Diagnoses    Hammertoe of left foot    -  Primary   Relevant Orders   DG Foot Complete Left (Completed)   Corn of toe       Toe pain, left       S/P foot surgery, left           -Patient seen and evaluated -Xrays reviewed -Applied dry sterile dressing to surgical site left foot secured with ACE wrap and  stockinet  -Advised patient to make sure to keep dressings clean, dry, and intact to left surgical site, removing the ACE as needed  -Advised patient to continue with CAM boot on left foot    -Advised patient to limit activity to necessity  -Advised patient to ice and elevate as necessary  -Will plan for suture removal at next office visit. In the meantime, patient to call office if any issues or problems arise.   Landis Martins, DPM

## 2017-12-30 ENCOUNTER — Ambulatory Visit (INDEPENDENT_AMBULATORY_CARE_PROVIDER_SITE_OTHER): Payer: Medicare Other | Admitting: Sports Medicine

## 2017-12-30 ENCOUNTER — Encounter: Payer: Self-pay | Admitting: Sports Medicine

## 2017-12-30 VITALS — BP 113/67 | HR 52 | Resp 16

## 2017-12-30 DIAGNOSIS — M2042 Other hammer toe(s) (acquired), left foot: Secondary | ICD-10-CM

## 2017-12-30 DIAGNOSIS — Z9889 Other specified postprocedural states: Secondary | ICD-10-CM

## 2017-12-30 DIAGNOSIS — M79675 Pain in left toe(s): Secondary | ICD-10-CM

## 2017-12-30 DIAGNOSIS — L84 Corns and callosities: Secondary | ICD-10-CM

## 2017-12-30 NOTE — Progress Notes (Signed)
Subjective: Kathryn Weaver is a 72 y.o. female patient seen today in office for POV #2 (DOS 12-15-17), S/P Left 2nd hammertoe repair with excision of corn and kwire. Patient denies pain at surgical site, denies calf pain, denies headache, chest pain, shortness of breath, nausea, vomiting, fever, or chills. Patient states that she is doing well, not in any pain. No other issues noted.   Patient Active Problem List   Diagnosis Date Noted  . MGUS (monoclonal gammopathy of unknown significance) 09/17/2017  . Hyperglycemia 10/21/2016  . Somnolence, daytime 04/11/2016  . Fatigue 04/05/2016  . Contusion 03/27/2015  . Internal hemorrhoid 04/20/2014  . Iron deficiency 03/16/2014  . Encounter for Medicare annual wellness exam 03/01/2014  . Uterine prolapse 03/01/2014  . Esophageal dysphagia 01/05/2013  . Stress reaction 09/01/2012  . Hyperlipidemia 04/28/2012  . Routine general medical examination at a health care facility 04/20/2012  . Obesity 02/05/2012  . Leukocytopenia 10/23/2010  . Thrombocytopenia ([place]) 10/24/2009  . TRANSAMINASES, SERUM, ELEVATED 10/24/2009  . POSTMENOPAUSAL STATUS 08/09/2008  . DEPRESSION 08/05/2007  . Essential hypertension 08/05/2007  . GERD 04/20/2007  . DIVERTICULAR DISEASE 04/20/2007  . History of colonic polyps 04/13/2007    Current Outpatient Medications on File Prior to Visit  Medication Sig Dispense Refill  . atorvastatin (LIPITOR) 10 MG tablet Take 1 tablet (10 mg total) by mouth daily. **need to schedule an appt with doctor before future refills are given 90 tablet 0  . cholecalciferol (VITAMIN D) 1000 units tablet Take 5,000 Units by mouth daily.    . Esomeprazole Magnesium (NEXIUM 24HR PO) Take 22.3 mg by mouth daily.    . hydrochlorothiazide (HYDRODIURIL) 25 MG tablet TAKE 1 TABLET BY MOUTH EVERY DAY 90 tablet 0  . Multiple Vitamins-Minerals (CENTRUM PO) Take 1 tablet by mouth 2 (two) times a week. Take one by mouth daily    . sertraline (ZOLOFT) 50 MG  tablet TAKE 1 TABLETBY MOUTH EVERY DAY 90 tablet 3  . [DISCONTINUED] ranitidine (ZANTAC) 150 MG capsule Take 1 capsule (150 mg total) by mouth 2 (two) times daily. For 3 months 60 capsule 2   No current facility-administered medications on file prior to visit.     No Known Allergies  Objective: There were no vitals filed for this visit.  General: No acute distress, AAOx3  Left foot: Sutures and Kwire intact with no gapping or dehiscence at surgical site, moderate swelling to left 2nd toe, no erythema, no warmth, no drainage, no signs of infection noted, Capillary fill time <3 seconds in all digits, gross sensation present via light touch to left foot. No pain or crepitation with range of motion left.  No pain with calf compression.   Assessment and Plan:  Problem List Items Addressed This Visit    None    Visit Diagnoses    Hammertoe of left foot    -  Primary   Corn of toe       Toe pain, left       S/P foot surgery, left           -Patient seen and evaluated -Will hold off on removing sutures because of toe swelling -Applied dry sterile dressing to surgical site left foot secured with ACE wrap and stockinet  -Advised patient to make sure to keep dressings clean, dry, and intact to left surgical site, removing the ACE as needed  -Advised patient to continue with CAM boot on left foot    -Advised patient to limit activity to  necessity  -Advised patient to ice and elevate to help with swellin -Advised patient to take PO pain pill prior to next visit to help with pain   -Will plan for topical lidocaine and suture removal at next office visit. In the meantime, patient to call office if any issues or problems arise.   Landis Martins, DPM

## 2018-01-06 ENCOUNTER — Encounter: Payer: Medicare Other | Admitting: Sports Medicine

## 2018-01-06 ENCOUNTER — Ambulatory Visit (INDEPENDENT_AMBULATORY_CARE_PROVIDER_SITE_OTHER): Payer: Medicare Other | Admitting: Sports Medicine

## 2018-01-06 ENCOUNTER — Encounter: Payer: Self-pay | Admitting: Sports Medicine

## 2018-01-06 DIAGNOSIS — Z9889 Other specified postprocedural states: Secondary | ICD-10-CM

## 2018-01-06 DIAGNOSIS — M79675 Pain in left toe(s): Secondary | ICD-10-CM

## 2018-01-06 DIAGNOSIS — L84 Corns and callosities: Secondary | ICD-10-CM

## 2018-01-06 DIAGNOSIS — M2042 Other hammer toe(s) (acquired), left foot: Secondary | ICD-10-CM

## 2018-01-06 NOTE — Progress Notes (Signed)
Subjective: Kathryn Weaver is a 72 y.o. female patient seen today in office for POV #3 (DOS 12-15-17), S/P Left 2nd hammertoe repair with excision of corn and kwire. Patient denies pain at surgical site, denies calf pain, denies headache, chest pain, shortness of breath, nausea, vomiting, fever, or chills. Patient states that she is doing well, not in any pain, took pain pill today before coming in. No other issues noted.   Patient Active Problem List   Diagnosis Date Noted  . MGUS (monoclonal gammopathy of unknown significance) 09/17/2017  . Hyperglycemia 10/21/2016  . Somnolence, daytime 04/11/2016  . Fatigue 04/05/2016  . Contusion 03/27/2015  . Internal hemorrhoid 04/20/2014  . Iron deficiency 03/16/2014  . Encounter for Medicare annual wellness exam 03/01/2014  . Uterine prolapse 03/01/2014  . Esophageal dysphagia 01/05/2013  . Stress reaction 09/01/2012  . Hyperlipidemia 04/28/2012  . Routine general medical examination at a health care facility 04/20/2012  . Obesity 02/05/2012  . Leukocytopenia 10/23/2010  . Thrombocytopenia ([place]) 10/24/2009  . TRANSAMINASES, SERUM, ELEVATED 10/24/2009  . POSTMENOPAUSAL STATUS 08/09/2008  . DEPRESSION 08/05/2007  . Essential hypertension 08/05/2007  . GERD 04/20/2007  . DIVERTICULAR DISEASE 04/20/2007  . History of colonic polyps 04/13/2007    Current Outpatient Medications on File Prior to Visit  Medication Sig Dispense Refill  . atorvastatin (LIPITOR) 10 MG tablet Take 1 tablet (10 mg total) by mouth daily. **need to schedule an appt with doctor before future refills are given 90 tablet 0  . cholecalciferol (VITAMIN D) 1000 units tablet Take 5,000 Units by mouth daily.    . Esomeprazole Magnesium (NEXIUM 24HR PO) Take 22.3 mg by mouth daily.    . hydrochlorothiazide (HYDRODIURIL) 25 MG tablet TAKE 1 TABLET BY MOUTH EVERY DAY 90 tablet 0  . Multiple Vitamins-Minerals (CENTRUM PO) Take 1 tablet by mouth 2 (two) times a week. Take one by mouth  daily    . sertraline (ZOLOFT) 50 MG tablet TAKE 1 TABLETBY MOUTH EVERY DAY 90 tablet 3  . [DISCONTINUED] ranitidine (ZANTAC) 150 MG capsule Take 1 capsule (150 mg total) by mouth 2 (two) times daily. For 3 months 60 capsule 2   No current facility-administered medications on file prior to visit.     No Known Allergies  Objective: There were no vitals filed for this visit.  General: No acute distress, AAOx3  Left foot: Sutures and Kwire intact with no gapping or dehiscence at surgical site, mild swelling to left 2nd toe, no erythema, no warmth, no drainage, no signs of infection noted, Capillary fill time <3 seconds in all digits, gross sensation present via light touch to left foot. No pain or crepitation with range of motion left.  No pain with calf compression.   Assessment and Plan:  Problem List Items Addressed This Visit    None    Visit Diagnoses    Hammertoe of left foot    -  Primary   Corn of toe       Toe pain, left       S/P foot surgery, left           -Patient seen and evaluated -Sutures were removed  -Applied dry sterile dressing to surgical site left foot secured with ACE wrap and stockinet  -Advised patient to make sure to keep dressings clean, dry, and intact to left surgical site, removing the ACE as needed  -Advised patient to continue with CAM boot on left foot    -Advised patient to limit activity  to necessity  -Advised patient to ice and elevate to help with swellin -Advised patient to take PO pain pill prior to next visit to help with pain as get ready to remove Kwire -Will plan forxray and topical lidocaine and kwire removal at next office visit. In the meantime, patient to call office if any issues or problems arise.   Landis Martins, DPM

## 2018-01-13 ENCOUNTER — Ambulatory Visit (INDEPENDENT_AMBULATORY_CARE_PROVIDER_SITE_OTHER): Payer: Medicare Other | Admitting: Sports Medicine

## 2018-01-13 ENCOUNTER — Encounter: Payer: Self-pay | Admitting: Sports Medicine

## 2018-01-13 ENCOUNTER — Ambulatory Visit (INDEPENDENT_AMBULATORY_CARE_PROVIDER_SITE_OTHER): Payer: Medicare Other

## 2018-01-13 DIAGNOSIS — L84 Corns and callosities: Secondary | ICD-10-CM

## 2018-01-13 DIAGNOSIS — M2042 Other hammer toe(s) (acquired), left foot: Secondary | ICD-10-CM

## 2018-01-13 DIAGNOSIS — M79675 Pain in left toe(s): Secondary | ICD-10-CM

## 2018-01-13 DIAGNOSIS — Z9889 Other specified postprocedural states: Secondary | ICD-10-CM

## 2018-01-13 NOTE — Progress Notes (Signed)
Subjective: Kathryn Weaver is a 72 y.o. female patient seen today in office for POV #4 (DOS 12-15-17), S/P Left 2nd hammertoe repair with excision of corn and kwire. Patient denies pain at surgical site, states that the pin came out yesterday by itself, denies calf pain, denies headache, chest pain, shortness of breath, nausea, vomiting, fever, or chills. Patient states that she is doing well, not in any pain, using CAM boot. No other issues noted.   Patient Active Problem List   Diagnosis Date Noted  . MGUS (monoclonal gammopathy of unknown significance) 09/17/2017  . Hyperglycemia 10/21/2016  . Somnolence, daytime 04/11/2016  . Fatigue 04/05/2016  . Contusion 03/27/2015  . Internal hemorrhoid 04/20/2014  . Iron deficiency 03/16/2014  . Encounter for Medicare annual wellness exam 03/01/2014  . Uterine prolapse 03/01/2014  . Esophageal dysphagia 01/05/2013  . Stress reaction 09/01/2012  . Hyperlipidemia 04/28/2012  . Routine general medical examination at a health care facility 04/20/2012  . Obesity 02/05/2012  . Leukocytopenia 10/23/2010  . Thrombocytopenia ([place]) 10/24/2009  . TRANSAMINASES, SERUM, ELEVATED 10/24/2009  . POSTMENOPAUSAL STATUS 08/09/2008  . DEPRESSION 08/05/2007  . Essential hypertension 08/05/2007  . GERD 04/20/2007  . DIVERTICULAR DISEASE 04/20/2007  . History of colonic polyps 04/13/2007    Current Outpatient Medications on File Prior to Visit  Medication Sig Dispense Refill  . atorvastatin (LIPITOR) 10 MG tablet Take 1 tablet (10 mg total) by mouth daily. **need to schedule an appt with doctor before future refills are given 90 tablet 0  . cholecalciferol (VITAMIN D) 1000 units tablet Take 5,000 Units by mouth daily.    . Esomeprazole Magnesium (NEXIUM 24HR PO) Take 22.3 mg by mouth daily.    . hydrochlorothiazide (HYDRODIURIL) 25 MG tablet TAKE 1 TABLET BY MOUTH EVERY DAY 90 tablet 0  . Multiple Vitamins-Minerals (CENTRUM PO) Take 1 tablet by mouth 2 (two)  times a week. Take one by mouth daily    . sertraline (ZOLOFT) 50 MG tablet TAKE 1 TABLETBY MOUTH EVERY DAY 90 tablet 3  . [DISCONTINUED] ranitidine (ZANTAC) 150 MG capsule Take 1 capsule (150 mg total) by mouth 2 (two) times daily. For 3 months 60 capsule 2   No current facility-administered medications on file prior to visit.     No Known Allergies  Objective: There were no vitals filed for this visit.  General: No acute distress, AAOx3  Left foot: Incision intact with no gapping or dehiscence at surgical site, mild swelling to left 2nd toe, no erythema, no warmth, no drainage, no signs of infection noted, Capillary fill time <3 seconds in all digits, gross sensation present via light touch to left foot. No pain or crepitation with range of motion left.  No pain with calf compression.   Xrays consistent with post op status, kwire removed by itself per patient report with a small amount of displacement of the proximal phalanx distally and soft tissue swelling   Assessment and Plan:  Problem List Items Addressed This Visit    None    Visit Diagnoses    Hammer toe of left foot    -  Primary   Relevant Orders   DG Foot Complete Left (Completed)   Corn of toe       Toe pain, left       S/P foot surgery, left           -Patient seen and evaluated -Xrays reviewed -Patient self reports pin came out on yesterday  -Applied dry sterile stockinet  to left foot   -Advised patient may now shower   -Dispensed post op shoe  -Advised patient to limit activity to necessity  -Advised patient to ice and elevate to help with swelling at 2nd toe  -Advised patient to still take it easy as toe continues to heal  -Will plan for increasing activities and allowing patient in normal shoe if 2nd toe swelling is improved at next office visit. If swelling is not improved may coban splint toe. In the meantime, patient to call office if any issues or problems arise.   Landis Martins, DPM

## 2018-01-14 ENCOUNTER — Other Ambulatory Visit (HOSPITAL_COMMUNITY): Payer: Self-pay | Admitting: *Deleted

## 2018-01-14 DIAGNOSIS — D696 Thrombocytopenia, unspecified: Secondary | ICD-10-CM

## 2018-01-15 ENCOUNTER — Ambulatory Visit (HOSPITAL_COMMUNITY): Payer: Medicare Other | Admitting: Adult Health

## 2018-01-15 ENCOUNTER — Other Ambulatory Visit (HOSPITAL_COMMUNITY): Payer: Medicare Other

## 2018-01-15 NOTE — Progress Notes (Deleted)
Kathryn Weaver, [place] [address]   CLINIC:  Medical Oncology/Hematology  PCP:  Kathryn Greenspan, MD [place] [id number] [phone]   REASON FOR VISIT:  Follow-up for Thrombocytopenia & leukopenia AND MGUS   CURRENT THERAPY: Observation   BRIEF ONCOLOGIC HISTORY:   No history exists.     HISTORY OF PRESENT ILLNESS:     INTERVAL HISTORY:    REVIEW OF SYSTEMS:  Review of Systems - Oncology   PAST MEDICAL/SURGICAL HISTORY:  Past Medical History:  Diagnosis Date  . Adenomatous polyp of colon 03/2007  . Anemia    s/p GI bleed, hospital 5/08  . Depression   . Diverticulosis   . Endometriosis    uterine US 06/2003  . GERD (gastroesophageal reflux disease)   . Hiatal hernia    EGD 5/08- stricture, HH, gastritis, GERD  . Hypertension   . Hypertension   . Iron deficiency 03/16/2014   Past Surgical History:  Procedure Laterality Date  . cardiac CT  9/12   normal   . TUBAL LIGATION       SOCIAL HISTORY:  Social History   Socioeconomic History  . Marital status: Married    Spouse name: Not on file  . Number of children: Not on file  . Years of education: Not on file  . Highest education level: Not on file  Social Needs  . Financial resource strain: Not on file  . Food insecurity - worry: Not on file  . Food insecurity - inability: Not on file  . Transportation needs - medical: Not on file  . Transportation needs - non-medical: Not on file  Occupational History  . Occupation: [employer]  Tobacco Use  . Smoking status: Never Smoker  . Smokeless tobacco: Never Used  Substance and Sexual Activity  . Alcohol use: No    Alcohol/week: 0.0 oz  . Drug use: No  . Sexual activity: Yes  Other Topics Concern  . Not on file  Social History Narrative  . Not on file    FAMILY HISTORY:  Family History  Problem Relation Age of Onset  . Cancer Father        unknown type  . Hypertension  Mother   . Colon cancer Neg Hx   . Esophageal cancer Neg Hx   . Rectal cancer Neg Hx   . Stomach cancer Neg Hx     CURRENT MEDICATIONS:  Outpatient Encounter Medications as of 01/15/2018  Medication Sig  . atorvastatin (LIPITOR) 10 MG tablet Take 1 tablet (10 mg total) by mouth daily. **need to schedule an appt with doctor before future refills are given  . cholecalciferol (VITAMIN D) 1000 units tablet Take 5,000 Units by mouth daily.  . Esomeprazole Magnesium (NEXIUM 24HR PO) Take 22.3 mg by mouth daily.  . hydrochlorothiazide (HYDRODIURIL) 25 MG tablet TAKE 1 TABLET BY MOUTH EVERY DAY  . Multiple Vitamins-Minerals (CENTRUM PO) Take 1 tablet by mouth 2 (two) times a week. Take one by mouth daily  . sertraline (ZOLOFT) 50 MG tablet TAKE 1 TABLETBY MOUTH EVERY DAY  . [DISCONTINUED] ranitidine (ZANTAC) 150 MG capsule Take 1 capsule (150 mg total) by mouth 2 (two) times daily. For 3 months   No facility-administered encounter medications on file as of 01/15/2018.     ALLERGIES:  No Known Allergies   PHYSICAL EXAM:  ECOG Performance status: ***  There were no vitals filed for this visit. There were  no vitals filed for this visit.  Physical Exam   LABORATORY DATA:  I have reviewed the labs as listed.  CBC    Component Value Date/Time   WBC 3.2 (L) 09/11/2017 0836   RBC 3.89 09/11/2017 0836   HGB 12.5 09/11/2017 0836   HGB 13.6 02/21/2012 0925   HCT 38.2 09/11/2017 0836   HCT 42.0 02/21/2012 0925   PLT 94 (L) 09/11/2017 0836   PLT 106 (L) 02/21/2012 0925   MCV 98.2 09/11/2017 0836   MCV 98 02/21/2012 0925   MCH 32.1 09/11/2017 0836   MCHC 32.7 09/11/2017 0836   RDW 12.7 09/11/2017 0836   RDW 13.4 02/21/2012 0925   LYMPHSABS 1.8 09/11/2017 0836   LYMPHSABS 1.1 02/21/2012 0925   MONOABS 0.3 09/11/2017 0836   MONOABS 0.3 02/21/2012 0925   EOSABS 0.0 09/11/2017 0836   EOSABS 0.0 02/21/2012 0925   BASOSABS 0.0 09/11/2017 0836   BASOSABS 0.0 02/21/2012 0925   CMP Latest  Ref Rng & Units 09/11/2017 04/21/2017 02/17/2017  Glucose 65 - 99 mg/dL 104(H) 107(H) 111(H)  BUN 6 - 20 mg/dL 12 9 13   Creatinine 0.44 - 1.00 mg/dL 0.80 0.74 0.82  Sodium 135 - 145 mmol/L 138 140 142  Potassium 3.5 - 5.1 mmol/L 3.8 3.8 4.2  Chloride 101 - 111 mmol/L 101 100(L) 103  CO2 22 - 32 mmol/L 31 31 33(H)  Calcium 8.9 - 10.3 mg/dL 10.3 10.3 11.2(H)  Total Protein 6.5 - 8.1 g/dL 7.9 8.0 8.6(H)  Total Bilirubin 0.3 - 1.2 mg/dL 0.6 0.7 0.5  Alkaline Phos 38 - 126 U/L 83 75 96  AST 15 - 41 U/L 31 28 19   ALT 14 - 54 U/L 31 23 21     PENDING LABS:    DIAGNOSTIC IMAGING:  *The following radiologic images and reports have been reviewed independently and agree with below findings.  ***  PATHOLOGY:  ***   ASSESSMENT & PLAN:   Thrombocytopenia & leukopenia AND MGUS        Dispo:  -   All questions were answered to patient's stated satisfaction. Encouraged patient to call with any new concerns or questions before her next visit to the cancer center and we can certain see her sooner, if needed.    Plan of care discussed with Dr. ***, who agrees with the above aforementioned.    Orders placed this encounter:  No orders of the defined types were placed in this encounter.     Mike Craze, NP [name] [phone]

## 2018-01-23 ENCOUNTER — Other Ambulatory Visit: Payer: Self-pay | Admitting: *Deleted

## 2018-01-23 MED ORDER — ATORVASTATIN CALCIUM 10 MG PO TABS: 10.0000 mg | ORAL_TABLET | Freq: Every day | ORAL | 0 refills | Status: DC

## 2018-01-27 ENCOUNTER — Encounter: Payer: Self-pay | Admitting: Sports Medicine

## 2018-01-27 ENCOUNTER — Ambulatory Visit (INDEPENDENT_AMBULATORY_CARE_PROVIDER_SITE_OTHER): Payer: Medicare Other | Admitting: Sports Medicine

## 2018-01-27 ENCOUNTER — Ambulatory Visit: Payer: Medicare Other

## 2018-01-27 VITALS — BP 120/71 | HR 61 | Resp 16

## 2018-01-27 DIAGNOSIS — L84 Corns and callosities: Secondary | ICD-10-CM

## 2018-01-27 DIAGNOSIS — M2042 Other hammer toe(s) (acquired), left foot: Secondary | ICD-10-CM

## 2018-01-27 DIAGNOSIS — Z9889 Other specified postprocedural states: Secondary | ICD-10-CM

## 2018-01-27 DIAGNOSIS — M79675 Pain in left toe(s): Secondary | ICD-10-CM

## 2018-01-27 NOTE — Progress Notes (Signed)
Subjective: Kathryn Weaver is a 72 y.o. female patient seen today in office for POV #5 (DOS 12-15-17), S/P Left 2nd hammertoe repair with excision of corn and kwire. Patient denies pain at surgical site, states that she is doing good, denies calf pain, denies headache, chest pain, shortness of breath, nausea, vomiting, fever, or chills. No other issues noted.   Patient Active Problem List   Diagnosis Date Noted  . MGUS (monoclonal gammopathy of unknown significance) 09/17/2017  . Hyperglycemia 10/21/2016  . Somnolence, daytime 04/11/2016  . Fatigue 04/05/2016  . Contusion 03/27/2015  . Internal hemorrhoid 04/20/2014  . Iron deficiency 03/16/2014  . Encounter for Medicare annual wellness exam 03/01/2014  . Uterine prolapse 03/01/2014  . Esophageal dysphagia 01/05/2013  . Stress reaction 09/01/2012  . Hyperlipidemia 04/28/2012  . Routine general medical examination at a health care facility 04/20/2012  . Obesity 02/05/2012  . Leukocytopenia 10/23/2010  . Thrombocytopenia ([place]) 10/24/2009  . TRANSAMINASES, SERUM, ELEVATED 10/24/2009  . POSTMENOPAUSAL STATUS 08/09/2008  . DEPRESSION 08/05/2007  . Essential hypertension 08/05/2007  . GERD 04/20/2007  . DIVERTICULAR DISEASE 04/20/2007  . History of colonic polyps 04/13/2007    Current Outpatient Medications on File Prior to Visit  Medication Sig Dispense Refill  . atorvastatin (LIPITOR) 10 MG tablet Take 1 tablet (10 mg total) by mouth daily. 90 tablet 0  . cholecalciferol (VITAMIN D) 1000 units tablet Take 5,000 Units by mouth daily.    . Esomeprazole Magnesium (NEXIUM 24HR PO) Take 22.3 mg by mouth daily.    . hydrochlorothiazide (HYDRODIURIL) 25 MG tablet TAKE 1 TABLET BY MOUTH EVERY DAY 90 tablet 0  . Multiple Vitamins-Minerals (CENTRUM PO) Take 1 tablet by mouth 2 (two) times a week. Take one by mouth daily    . sertraline (ZOLOFT) 50 MG tablet TAKE 1 TABLETBY MOUTH EVERY DAY 90 tablet 3  . [DISCONTINUED] ranitidine (ZANTAC) 150 MG  capsule Take 1 capsule (150 mg total) by mouth 2 (two) times daily. For 3 months 60 capsule 2   No current facility-administered medications on file prior to visit.     No Known Allergies  Objective: There were no vitals filed for this visit.  General: No acute distress, AAOx3  Left foot: Incision intact with no gapping or dehiscence at surgical site, mild scar and mild swelling to left 2nd toe, no erythema, no warmth, no drainage, no signs of infection noted, Capillary fill time <3 seconds in all digits, gross sensation present via light touch to left foot. No pain or crepitation with range of motion left.  No pain with calf compression.   Assessment and Plan:  Problem List Items Addressed This Visit    None    Visit Diagnoses    Hammer toe of left foot    -  Primary   Corn of toe       Toe pain, left       S/P foot surgery, left           -Patient seen and evaluated -Instructed patient to coban splint 2nd toe to 3rd toe at bedtime to assist with edema control  -Recommend daily scar creams  -May slowly transition from post op shoe to tennis shoe as tolerated  -Advised patient to limit activity to necessity  -Advised patient to ice and elevate to help with swelling at 2nd toe  -Will plan for increasing activities and d/c toe splinting if 2nd toe swelling is improved at next office visit. In the meantime, patient to  call office if any issues or problems arise.   Landis Martins, DPM

## 2018-01-28 ENCOUNTER — Ambulatory Visit (INDEPENDENT_AMBULATORY_CARE_PROVIDER_SITE_OTHER): Payer: Medicare Other

## 2018-01-28 VITALS — BP 122/70 | HR 67 | Temp 98.1°F | Ht 62.5 in | Wt 174.8 lb

## 2018-01-28 DIAGNOSIS — Z0000 Encounter for general adult medical examination without abnormal findings: Secondary | ICD-10-CM

## 2018-01-28 NOTE — Progress Notes (Signed)
PCP notes:   Health maintenance:  Flu vaccine - PCP please address at next appt Mammogram - addressed Colonoscopy - addressed  Abnormal screenings:   Hearing - failed  Hearing Screening   125Hz  250Hz  500Hz  1000Hz  2000Hz  3000Hz  4000Hz  6000Hz  8000Hz   Right ear:   0 0 0  0    Left ear:   40 40 40  40    Comments: Deaf in right ear  Patient concerns:   None  Nurse concerns:  None  Next PCP appt:   02/18/2018 @ 1000  I reviewed health advisor's note, was available for consultation, and agree with documentation and plan. Loura Pardon MD

## 2018-01-28 NOTE — Patient Instructions (Addendum)
Ms. Kuna , Thank you for taking time to come for your Medicare Wellness Visit. I appreciate your ongoing commitment to your health goals. Please review the following plan we discussed and let me know if I can assist you in the future.   These are the goals we discussed: Goals    . DIET - EAT MORE FRUITS AND VEGETABLES     Starting 01/28/2018, I will continue to eat 4-5 servings of fruits and vegetables.       This is a list of the screening recommended for you and due dates:  Health Maintenance  Topic Date Due  . Flu Shot  02/08/2018*  . Mammogram  11/10/2018*  . Colon Cancer Screening  11/10/2018*  . DEXA scan (bone density measurement)  12/13/2020*  . Tetanus Vaccine  08/09/2018  .  Hepatitis C: One time screening is recommended by Center for Disease Control  (CDC) for  adults born from [birth year] through 1964.   Completed  . Pneumonia vaccines  Completed  *Topic was postponed. The date shown is not the original due date.   Preventive Care for Adults  A healthy lifestyle and preventive care can promote health and wellness. Preventive health guidelines for adults include the following key practices.  . A routine yearly physical is a good way to check with your health care provider about your health and preventive screening. It is a chance to share any concerns and updates on your health and to receive a thorough exam.  . Visit your dentist for a routine exam and preventive care every 6 months. Brush your teeth twice a day and floss once a day. Good oral hygiene prevents tooth decay and gum disease.  . The frequency of eye exams is based on your age, health, family medical history, use  of contact lenses, and other factors. Follow your health care provider's recommendations for frequency of eye exams.  . Eat a healthy diet. Foods like vegetables, fruits, whole grains, low-fat dairy products, and lean protein foods contain the nutrients you need without too many calories. Decrease your  intake of foods high in solid fats, added sugars, and salt. Eat the right amount of calories for you. Get information about a proper diet from your health care provider, if necessary.  . Regular physical exercise is one of the most important things you can do for your health. Most adults should get at least 150 minutes of moderate-intensity exercise (any activity that increases your heart rate and causes you to sweat) each week. In addition, most adults need muscle-strengthening exercises on 2 or more days a week.  Silver Sneakers may be a benefit available to you. To determine eligibility, you may visit the website: www.silversneakers.com or contact program at 6503897631 Mon-Fri between 8AM-8PM.   . Maintain a healthy weight. The body mass index (BMI) is a screening tool to identify possible weight problems. It provides an estimate of body fat based on height and weight. Your health care provider can find your BMI and can help you achieve or maintain a healthy weight.   For adults 20 years and older: ? A BMI below 18.5 is considered underweight. ? A BMI of 18.5 to 24.9 is normal. ? A BMI of 25 to 29.9 is considered overweight. ? A BMI of 30 and above is considered obese.   . Maintain normal blood lipids and cholesterol levels by exercising and minimizing your intake of saturated fat. Eat a balanced diet with plenty of fruit and vegetables. Blood tests  for lipids and cholesterol should begin at age 85 and be repeated every 5 years. If your lipid or cholesterol levels are high, you are over 50, or you are at high risk for heart disease, you may need your cholesterol levels checked more frequently. Ongoing high lipid and cholesterol levels should be treated with medicines if diet and exercise are not working.  . If you smoke, find out from your health care provider how to quit. If you do not use tobacco, please do not start.  . If you choose to drink alcohol, please do not consume more than 2  drinks per day. One drink is considered to be 12 ounces (355 mL) of beer, 5 ounces (148 mL) of wine, or 1.5 ounces (44 mL) of liquor.  . If you are 33-59 years old, ask your health care provider if you should take aspirin to prevent strokes.  . Use sunscreen. Apply sunscreen liberally and repeatedly throughout the day. You should seek shade when your shadow is shorter than you. Protect yourself by wearing long sleeves, pants, a wide-brimmed hat, and sunglasses year round, whenever you are outdoors.  . Once a month, do a whole body skin exam, using a mirror to look at the skin on your back. Tell your health care provider of new moles, moles that have irregular borders, moles that are larger than a pencil eraser, or moles that have changed in shape or color.

## 2018-01-28 NOTE — Progress Notes (Signed)
Subjective:   Kathryn Weaver is a 72 y.o. female who presents for Medicare Annual (Subsequent) preventive examination.  Review of Systems:  N/A Cardiac Risk Factors include: advanced age (>27men, >56 women);hypertension;obesity (BMI >30kg/m2);dyslipidemia     Objective:     Vitals: BP 122/70 (BP Location: Right Arm, Patient Position: Sitting, Cuff Size: Normal)   Pulse 67   Temp 98.1 F (36.7 C) (Oral)   Ht 5' 2.5" (1.588 m) Comment: no shoes  Wt 174 lb 12 oz (79.3 kg)   SpO2 98%   BMI 31.45 kg/m   Body mass index is 31.45 kg/m.  Advanced Directives 01/28/2018 09/17/2017 06/13/2017 04/29/2017 04/21/2017 11/26/2016 03/19/2016  Does Patient Have a Medical Advance Directive? Yes No No No No No No  Type of Advance Directive [place] in Chart? No - copy requested - - - - - -  Would patient like information on creating a medical advance directive? - No - Patient declined No - Patient declined No - Patient declined No - Patient declined - -    Tobacco Social History   Tobacco Use  Smoking Status Never Smoker  Smokeless Tobacco Never Used     Counseling given: No   Clinical Intake:  Pre-visit preparation completed: Yes  Pain : No/denies pain Pain Score: 0-No pain     Nutritional Status: BMI > 30  Obese Nutritional Risks: None Diabetes: No  How often do you need to have someone help you when you read instructions, pamphlets, or other written materials from your doctor or pharmacy?: 1 - Never What is the last grade level you completed in school?: 12th grade  Interpreter Needed?: No  Comments: pt lives with spouse Information entered by :: LPinson, LPN  Past Medical History:  Diagnosis Date  . Adenomatous polyp of colon 03/2007  . Anemia    s/p GI bleed, hospital 5/08  . Depression   . Diverticulosis   . Endometriosis    uterine US 06/2003  . GERD (gastroesophageal reflux disease)   . Hiatal  hernia    EGD 5/08- stricture, HH, gastritis, GERD  . Hypertension   . Hypertension   . Iron deficiency 03/16/2014   Past Surgical History:  Procedure Laterality Date  . cardiac CT  9/12   normal   . HAMMER TOE SURGERY Left 12/02/2017  . TUBAL LIGATION     Family History  Problem Relation Age of Onset  . Cancer Father        unknown type  . Hypertension Mother   . Colon cancer Neg Hx   . Esophageal cancer Neg Hx   . Rectal cancer Neg Hx   . Stomach cancer Neg Hx    Social History   Socioeconomic History  . Marital status: Married    Spouse name: None  . Number of children: None  . Years of education: None  . Highest education level: None  Social Needs  . Financial resource strain: None  . Food insecurity - worry: None  . Food insecurity - inability: None  . Transportation needs - medical: None  . Transportation needs - non-medical: None  Occupational History  . Occupation: [employer]  Tobacco Use  . Smoking status: Never Smoker  . Smokeless tobacco: Never Used  Substance and Sexual Activity  . Alcohol use: No    Alcohol/week: 0.0 oz  . Drug use: No  . Sexual activity: Yes  Other Topics Concern  . None  Social History Narrative  . None    Outpatient Encounter Medications as of 01/28/2018  Medication Sig  . atorvastatin (LIPITOR) 10 MG tablet Take 1 tablet (10 mg total) by mouth daily.  . cholecalciferol (VITAMIN D) 1000 units tablet Take 5,000 Units by mouth daily.  . Esomeprazole Magnesium (NEXIUM 24HR PO) Take 22.3 mg by mouth daily.  . hydrochlorothiazide (HYDRODIURIL) 25 MG tablet TAKE 1 TABLET BY MOUTH EVERY DAY  . Multiple Vitamins-Minerals (CENTRUM PO) Take 1 tablet by mouth 2 (two) times a week. Take one by mouth daily  . sertraline (ZOLOFT) 50 MG tablet TAKE 1 TABLETBY MOUTH EVERY DAY  . [DISCONTINUED] ranitidine (ZANTAC) 150 MG capsule Take 1 capsule (150 mg total) by mouth 2 (two) times daily. For 3 months   No facility-administered  encounter medications on file as of 01/28/2018.     Activities of Daily Living In your present state of health, do you have any difficulty performing the following activities: 01/28/2018 05/26/2017  Hearing? Y N  Comment deafness in right ear -  Vision? N N  Difficulty concentrating or making decisions? N N  Walking or climbing stairs? N N  Dressing or bathing? N N  Doing errands, shopping? N -  Preparing Food and eating ? N -  Using the Toilet? N -  In the past six months, have you accidently leaked urine? N -  Do you have problems with loss of bowel control? N -  Managing your Medications? N -  Managing your Finances? N -  Housekeeping or managing your Housekeeping? N -  Some recent data might be hidden    Patient Care Team: Tower, [name], MD as PCP - General    Assessment:   This is a routine wellness examination for Kathryn Weaver.   Hearing Screening   125Hz  250Hz  500Hz  1000Hz  2000Hz  3000Hz  4000Hz  6000Hz  8000Hz   Right ear:   0 0 0  0    Left ear:   40 40 40  40    Comments: Deaf in right ear  Vision Screening Comments: Last vision exam in Nov 2018 with Dr. Peter Garter  Exercise Activities and Dietary recommendations Current Exercise Habits: The patient does not participate in regular exercise at present, Exercise limited by: None identified  Goals    . DIET - EAT MORE FRUITS AND VEGETABLES     Starting 01/28/2018, I will continue to eat 4-5 servings of fruits and vegetables.       Fall Risk Fall Risk  01/28/2018 11/26/2016 10/18/2015 03/01/2014  Falls in the past year? No No No No   Depression Screen PHQ 2/9 Scores 01/28/2018 11/26/2016 10/18/2015 03/01/2014  PHQ - 2 Score 0 0 0 0  PHQ- 9 Score 0 - - -     Cognitive Function MMSE - Mini Mental State Exam 01/28/2018 11/26/2016  Orientation to time 5 5  Orientation to Place 5 5  Registration 3 3  Attention/ Calculation 0 0  Recall 3 3  Language- name 2 objects 0 0  Language- repeat 1 1  Language- follow 3 step command 3 3    Language- read & follow direction 0 0  Write a sentence 0 0  Copy design 0 0  Total score 20 20     PLEASE NOTE: A Mini-Cog screen was completed. Maximum score is 20. A value of 0 denotes this part of [place] MMSE was not completed or the patient failed this part of the Mini-Cog screening.  Mini-Cog Screening Orientation to Time - Max 5 pts Orientation to Place - Max 5 pts Registration - Max 3 pts Recall - Max 3 pts Language Repeat - Max 1 pts Language Follow 3 Step Command - Max 3 pts     Immunization History  Administered Date(s) Administered  . Influenza,inj,Quad PF,6+ Mos 10/18/2015, 08/28/2016  . Pneumococcal Conjugate-13 10/18/2015  . Pneumococcal Polysaccharide-23 03/01/2014  . Td 03/26/1996, 08/09/2008    Screening Tests Health Maintenance  Topic Date Due  . INFLUENZA VACCINE  02/08/2018 (Originally 06/11/2017)  . MAMMOGRAM  11/10/2018 (Originally 01/14/2017)  . COLONOSCOPY  11/10/2018 (Originally 07/07/2017)  . DEXA SCAN  12/13/2020 (Originally 05/20/2012)  . TETANUS/TDAP  08/09/2018  . Hepatitis C Screening  Completed  . PNA vac Low Risk Adult  Completed      Plan:     I have personally reviewed, addressed, and noted the following in the patient's chart:  A. Medical and social history B. Use of alcohol, tobacco or illicit drugs  C. Current medications and supplements D. Functional ability and status E.  Nutritional status F.  Physical activity G. Advance directives H. List of other physicians I.  Hospitalizations, surgeries, and ER visits in previous 12 months J.  [place] to include hearing, vision, cognitive, depression L. Referrals and appointments - none  In addition, I have reviewed and discussed with patient certain preventive protocols, quality metrics, and best practice recommendations. A written personalized care plan for preventive services as well as general preventive health recommendations were provided to patient.  See attached  scanned questionnaire for additional information.   Signed,   Lindell Noe, MHA, BS, LPN Health Coach

## 2018-02-12 ENCOUNTER — Telehealth: Payer: Self-pay | Admitting: Family Medicine

## 2018-02-12 DIAGNOSIS — R739 Hyperglycemia, unspecified: Secondary | ICD-10-CM

## 2018-02-12 DIAGNOSIS — I10 Essential (primary) hypertension: Secondary | ICD-10-CM

## 2018-02-12 DIAGNOSIS — E7800 Pure hypercholesterolemia, unspecified: Secondary | ICD-10-CM

## 2018-02-12 NOTE — Telephone Encounter (Signed)
-----   Message from Ellamae Sia sent at 02/10/2018 10:31 AM EDT ----- Regarding: Lab orders for Monday, 4.8.19  AWV lab orders, please.

## 2018-02-16 ENCOUNTER — Other Ambulatory Visit (INDEPENDENT_AMBULATORY_CARE_PROVIDER_SITE_OTHER): Payer: Medicare Other

## 2018-02-16 DIAGNOSIS — I10 Essential (primary) hypertension: Secondary | ICD-10-CM

## 2018-02-16 DIAGNOSIS — E7800 Pure hypercholesterolemia, unspecified: Secondary | ICD-10-CM | POA: Diagnosis not present

## 2018-02-16 DIAGNOSIS — R739 Hyperglycemia, unspecified: Secondary | ICD-10-CM | POA: Diagnosis not present

## 2018-02-16 LAB — CBC WITH DIFFERENTIAL/PLATELET
Basophils Absolute: 0 10*3/uL (ref 0.0–0.1)
Basophils Relative: 0.3 % (ref 0.0–3.0)
EOS ABS: 0 10*3/uL (ref 0.0–0.7)
Eosinophils Relative: 1.5 % (ref 0.0–5.0)
HCT: 36.6 % (ref 36.0–46.0)
HEMOGLOBIN: 12.2 g/dL (ref 12.0–15.0)
LYMPHS PCT: 51.4 % — AB (ref 12.0–46.0)
Lymphs Abs: 1.6 10*3/uL (ref 0.7–4.0)
MCHC: 33.4 g/dL (ref 30.0–36.0)
MCV: 95.7 fl (ref 78.0–100.0)
MONO ABS: 0.4 10*3/uL (ref 0.1–1.0)
Monocytes Relative: 12.9 % — ABNORMAL HIGH (ref 3.0–12.0)
Neutro Abs: 1.1 10*3/uL — ABNORMAL LOW (ref 1.4–7.7)
Neutrophils Relative %: 33.9 % — ABNORMAL LOW (ref 43.0–77.0)
Platelets: 86 10*3/uL — ABNORMAL LOW (ref 150.0–400.0)
RBC: 3.82 Mil/uL — AB (ref 3.87–5.11)
RDW: 14.2 % (ref 11.5–15.5)
WBC: 3.2 10*3/uL — AB (ref 4.0–10.5)

## 2018-02-16 LAB — COMPREHENSIVE METABOLIC PANEL
ALBUMIN: 3.5 g/dL (ref 3.5–5.2)
ALK PHOS: 74 U/L (ref 39–117)
ALT: 24 U/L (ref 0–35)
AST: 24 U/L (ref 0–37)
BUN: 13 mg/dL (ref 6–23)
CALCIUM: 10.4 mg/dL (ref 8.4–10.5)
CO2: 31 mEq/L (ref 19–32)
CREATININE: 0.82 mg/dL (ref 0.40–1.20)
Chloride: 104 mEq/L (ref 96–112)
GFR: 88.44 mL/min (ref >60.00)
Glucose, Bld: 96 mg/dL (ref 70–99)
POTASSIUM: 4.4 meq/L (ref 3.5–5.1)
SODIUM: 140 meq/L (ref 135–145)
TOTAL PROTEIN: 7.8 g/dL (ref 6.0–8.3)
Total Bilirubin: 0.6 mg/dL (ref 0.2–1.2)

## 2018-02-16 LAB — LIPID PANEL
CHOLESTEROL: 154 mg/dL (ref 0–200)
HDL: 65.3 mg/dL (ref >39.00)
LDL Cholesterol: 80 mg/dL (ref 0–99)
NonHDL: 88.87
TRIGLYCERIDES: 45 mg/dL (ref 0.0–149.0)
Total CHOL/HDL Ratio: 2
VLDL: 9 mg/dL (ref 0.0–40.0)

## 2018-02-16 LAB — HEMOGLOBIN A1C: Hgb A1c MFr Bld: 6.2 % (ref 4.6–6.5)

## 2018-02-16 LAB — TSH: TSH: 2.82 u[IU]/mL (ref 0.35–4.50)

## 2018-02-18 ENCOUNTER — Encounter: Payer: Self-pay | Admitting: Family Medicine

## 2018-02-18 ENCOUNTER — Ambulatory Visit (INDEPENDENT_AMBULATORY_CARE_PROVIDER_SITE_OTHER): Payer: Medicare Other | Admitting: Family Medicine

## 2018-02-18 VITALS — BP 116/78 | HR 60 | Temp 98.0°F | Ht 62.5 in | Wt 172.5 lb

## 2018-02-18 DIAGNOSIS — Z23 Encounter for immunization: Secondary | ICD-10-CM | POA: Diagnosis not present

## 2018-02-18 DIAGNOSIS — E6609 Other obesity due to excess calories: Secondary | ICD-10-CM | POA: Diagnosis not present

## 2018-02-18 DIAGNOSIS — R739 Hyperglycemia, unspecified: Secondary | ICD-10-CM

## 2018-02-18 DIAGNOSIS — D708 Other neutropenia: Secondary | ICD-10-CM | POA: Diagnosis not present

## 2018-02-18 DIAGNOSIS — D696 Thrombocytopenia, unspecified: Secondary | ICD-10-CM | POA: Diagnosis not present

## 2018-02-18 DIAGNOSIS — Z6831 Body mass index (BMI) 31.0-31.9, adult: Secondary | ICD-10-CM

## 2018-02-18 DIAGNOSIS — Z0000 Encounter for general adult medical examination without abnormal findings: Secondary | ICD-10-CM

## 2018-02-18 DIAGNOSIS — Z1231 Encounter for screening mammogram for malignant neoplasm of breast: Secondary | ICD-10-CM | POA: Diagnosis not present

## 2018-02-18 DIAGNOSIS — D229 Melanocytic nevi, unspecified: Secondary | ICD-10-CM | POA: Insufficient documentation

## 2018-02-18 DIAGNOSIS — Z86010 Personal history of colonic polyps: Secondary | ICD-10-CM

## 2018-02-18 DIAGNOSIS — I10 Essential (primary) hypertension: Secondary | ICD-10-CM

## 2018-02-18 DIAGNOSIS — E7800 Pure hypercholesterolemia, unspecified: Secondary | ICD-10-CM

## 2018-02-18 MED ORDER — HYDROCHLOROTHIAZIDE 25 MG PO TABS
25.0000 mg | ORAL_TABLET | Freq: Every day | ORAL | 3 refills | Status: DC
Start: 2018-02-18 — End: 2019-03-25

## 2018-02-18 MED ORDER — SERTRALINE HCL 50 MG PO TABS: ORAL_TABLET | ORAL | 3 refills | Status: DC

## 2018-02-18 MED ORDER — ATORVASTATIN CALCIUM 10 MG PO TABS
10.0000 mg | ORAL_TABLET | Freq: Every day | ORAL | 3 refills | Status: DC
Start: 2018-02-18 — End: 2019-03-25

## 2018-02-18 NOTE — Progress Notes (Signed)
Subjective:    Patient ID: Kathryn Weaver, female    DOB: [birth year], 72 y.o.   MRN: [id number]  HPI Here for health maintenance exam and to review chronic medical problems    Feeling well overall    Wt Readings from Last 3 Encounters:  02/18/18 172 lb 8 oz (78.2 kg)  01/28/18 174 lb 12 oz (79.3 kg)  09/17/17 174 lb 3.2 oz (79 kg)  down a few lb  She keeps a grandchild- and that keeps her very active 72 yo  Eating healthy - most of the time  31.05 kg/m   amw was 3/20 Deaf in R ear- confirmed with hearing screen-nl in R (she has never been worked up for this in the past- years ago unsure how she lost it)   Mammogram 3/17- nl  Wants a referral  Self breast exam -no lumps   Colonoscopy 8/13- 5 y recall  She got a reminder  She has a hard time with the prep -unsure she wants to do it   Bone density screen -declines  No falls or fx   Zoster status  Not interested in the vaccine   Forgot her flu shot- would like one if not too late   bp is stable today  No cp or palpitations or headaches or edema  No side effects to medicines  BP Readings from Last 3 Encounters:  02/18/18 116/78  01/28/18 122/70  01/27/18 120/71     Hyperlipidemia  Lab Results  Component Value Date   CHOL 154 02/16/2018   CHOL 142 02/05/2017   CHOL 170 10/15/2016   Lab Results  Component Value Date   HDL 65.30 02/16/2018   HDL 43.70 02/05/2017   HDL 66.20 10/15/2016   Lab Results  Component Value Date   LDLCALC 80 02/16/2018   LDLCALC 85 02/05/2017   LDLCALC 94 10/15/2016   Lab Results  Component Value Date   TRIG 45.0 02/16/2018   TRIG 65.0 02/05/2017   TRIG 47.0 10/15/2016   Lab Results  Component Value Date   CHOLHDL 2 02/16/2018   CHOLHDL 3 02/05/2017   CHOLHDL 3 10/15/2016   Lab Results  Component Value Date   LDLDIRECT 156.0 04/21/2012   LDLDIRECT 138.9 10/22/2010   LDLDIRECT 163.0 10/20/2009   Atorvastatin and diet  Well controlled   Hyperglycemia  Lab Results    Component Value Date   HGBA1C 6.2 02/16/2018  eats sweets at night    Hx of abn cbc with chronic leukopenia and thrombocytopenia and MGUS Seen by hematology in November with rtc in 4 mo-has appt in may Lab Results  Component Value Date   WBC 3.2 (L) 02/16/2018   HGB 12.2 02/16/2018   HCT 36.6 02/16/2018   MCV 95.7 02/16/2018   PLT 86.0 (L) 02/16/2018     Lab Results  Component Value Date   CREATININE 0.82 02/16/2018   BUN 13 02/16/2018   NA 140 02/16/2018   K 4.4 02/16/2018   CL 104 02/16/2018   CO2 31 02/16/2018   Lab Results  Component Value Date   ALT 24 02/16/2018   AST 24 02/16/2018   ALKPHOS 74 02/16/2018   BILITOT 0.6 02/16/2018    Lab Results  Component Value Date   TSH 2.82 02/16/2018    Patient Active Problem List   Diagnosis Date Noted  . Change in skin mole 02/18/2018  . MGUS (monoclonal gammopathy of unknown significance) 09/17/2017  . Hyperglycemia 10/21/2016  . Somnolence, daytime 04/11/2016  .  Fatigue 04/05/2016  . Internal hemorrhoid 04/20/2014  . Iron deficiency 03/16/2014  . Screening mammogram, encounter for 03/01/2014  . Uterine prolapse 03/01/2014  . Esophageal dysphagia 01/05/2013  . Stress reaction 09/01/2012  . Hyperlipidemia 04/28/2012  . Routine general medical examination at a health care facility 04/20/2012  . Obesity 02/05/2012  . Other neutropenia ([place]) 10/23/2010  . Thrombocytopenia ([place]) 10/24/2009  . TRANSAMINASES, SERUM, ELEVATED 10/24/2009  . POSTMENOPAUSAL STATUS 08/09/2008  . DEPRESSION 08/05/2007  . Essential hypertension 08/05/2007  . GERD 04/20/2007  . DIVERTICULAR DISEASE 04/20/2007  . History of colonic polyps 04/13/2007   Past Medical History:  Diagnosis Date  . Adenomatous polyp of colon 03/2007  . Anemia    s/p GI bleed, hospital 5/08  . Depression   . Diverticulosis   . Endometriosis    uterine US 06/2003  . GERD (gastroesophageal reflux disease)   . Hiatal hernia    EGD 5/08- stricture, HH,  gastritis, GERD  . Hypertension   . Hypertension   . Iron deficiency 03/16/2014   Past Surgical History:  Procedure Laterality Date  . cardiac CT  9/12   normal   . HAMMER TOE SURGERY Left 12/02/2017  . TUBAL LIGATION     Social History   Tobacco Use  . Smoking status: Never Smoker  . Smokeless tobacco: Never Used  Substance Use Topics  . Alcohol use: No    Alcohol/week: 0.0 oz  . Drug use: No   Family History  Problem Relation Age of Onset  . Cancer Father        unknown type  . Hypertension Mother   . Colon cancer Neg Hx   . Esophageal cancer Neg Hx   . Rectal cancer Neg Hx   . Stomach cancer Neg Hx    No Known Allergies Current Outpatient Medications on File Prior to Visit  Medication Sig Dispense Refill  . cholecalciferol (VITAMIN D) 1000 units tablet Take 5,000 Units by mouth daily.    . Esomeprazole Magnesium (NEXIUM 24HR PO) Take 22.3 mg by mouth daily.    . Multiple Vitamins-Minerals (CENTRUM PO) Take 1 tablet by mouth 2 (two) times a week. Take one by mouth daily    . [DISCONTINUED] ranitidine (ZANTAC) 150 MG capsule Take 1 capsule (150 mg total) by mouth 2 (two) times daily. For 3 months 60 capsule 2   No current facility-administered medications on file prior to visit.      Review of Systems  Constitutional: Positive for fatigue. Negative for activity change, appetite change, fever and unexpected weight change.  HENT: Negative for congestion, ear pain, rhinorrhea, sinus pressure and sore throat.   Eyes: Negative for pain, redness and visual disturbance.  Respiratory: Negative for cough, shortness of breath and wheezing.   Cardiovascular: Negative for chest pain and palpitations.  Gastrointestinal: Negative for abdominal pain, blood in stool, constipation and diarrhea.  Endocrine: Negative for polydipsia and polyuria.  Genitourinary: Negative for dysuria, frequency and urgency.  Musculoskeletal: Negative for arthralgias, back pain and myalgias.  Skin:  Negative for pallor and rash.  Allergic/Immunologic: Negative for environmental allergies.  Neurological: Negative for dizziness, syncope and headaches.  Hematological: Negative for adenopathy. Does not bruise/bleed easily.  Psychiatric/Behavioral: Negative for decreased concentration and dysphoric mood. The patient is not nervous/anxious.        Objective:   Physical Exam  Constitutional: She appears well-developed and well-nourished. No distress.  obese and well appearing   HENT:  Head: Normocephalic and atraumatic.  Right Ear: External  ear normal.  Left Ear: External ear normal.  Mouth/Throat: Oropharynx is clear and moist.  Eyes: Pupils are equal, round, and reactive to light. Conjunctivae and EOM are normal. No scleral icterus.  Neck: Normal range of motion. Neck supple. No JVD present. Carotid bruit is not present. No thyromegaly present.  Cardiovascular: Normal rate, regular rhythm, normal heart sounds and intact distal pulses. Exam reveals no gallop.  Pulmonary/Chest: Effort normal and breath sounds normal. No respiratory distress. She has no wheezes. She exhibits no tenderness. No breast swelling, tenderness, discharge or bleeding.  Abdominal: Soft. Bowel sounds are normal. She exhibits no distension, no abdominal bruit and no mass. There is no tenderness.  Genitourinary: No breast swelling, tenderness, discharge or bleeding.  Genitourinary Comments: Breast exam: No mass, nodules, thickening, tenderness, bulging, retraction, inflamation, nipple discharge or skin changes noted.  No axillary or clavicular LA.      Musculoskeletal: Normal range of motion. She exhibits no edema or tenderness.  Lymphadenopathy:    She has no cervical adenopathy.  Neurological: She is alert. She has normal reflexes. No cranial nerve deficit. She exhibits normal muscle tone. Coordination normal.  Skin: Skin is warm and dry. No rash noted. No erythema. No pallor.  Nevus R upper back- 4 mm with irreg  shape and color (centrally)    Psychiatric: She has a normal mood and affect.  Pleasant           Assessment & Plan:   Problem List Items Addressed This Visit      Cardiovascular and Mediastinum   Essential hypertension    bp in fair control at this time  BP Readings from Last 1 Encounters:  02/18/18 116/78   No changes needed Disc lifstyle change with low sodium diet and exercise  Labs reviewed  Enc exercise       Relevant Medications   atorvastatin (LIPITOR) 10 MG tablet   hydrochlorothiazide (HYDRODIURIL) 25 MG tablet     Musculoskeletal and Integument   Change in skin mole    R upper back-has irregular shape with pale area in middle Ref to dermatology      Relevant Orders   Ambulatory referral to Dermatology     Other   History of colonic polyps    Due for colonoscopy Pt is unsure if she wants to schedule this - will call when ready  Offered cologuard if she decides against it  No bowel changes       Hyperglycemia    Lab Results  Component Value Date   HGBA1C 6.2 02/16/2018   disc imp of low glycemic diet and wt loss to prevent DM2  Handouts given        Hyperlipidemia    Disc goals for lipids and reasons to control them Rev labs with pt Rev low sat fat diet in detail  Controlled with atorvastatin and diet       Relevant Medications   atorvastatin (LIPITOR) 10 MG tablet   hydrochlorothiazide (HYDRODIURIL) 25 MG tablet   Obesity    Discussed how this problem influences overall health and the risks it imposes  Reviewed plan for weight loss with lower calorie diet (via better food choices and also portion control or program like weight watchers) and exercise building up to or more than 30 minutes 5 days per week including some aerobic activity         Other neutropenia ([place])    Continues f/u with hematology      Routine general medical examination  at a health care facility - Primary   Screening mammogram, encounter for    Scheduled  annual screening mammogram Nl breast exam today  Encouraged monthly self exams         Relevant Orders   MM 3D SCREEN BREAST BILATERAL (Completed)   Thrombocytopenia ([place])    Continues hematology f/u  Platelet count is 86 today  No bruising or bleeding        Other Visit Diagnoses    Need for influenza vaccination       Relevant Orders   Flu Vaccine QUAD 6+ mos PF IM (Fluarix Quad PF) (Completed)

## 2018-02-18 NOTE — Patient Instructions (Addendum)
Let us know if you want a colonoscopy and call us back when you are ready to schedule it   You are prediabetic  Try to get most of your carbohydrates from produce (with the exception of white potatoes)  Eat less bread/pasta/rice/snack foods/cereals/sweets and other items from the middle of the grocery store (processed carbs)   Exercise helps also   I will refer you to dermatology for mole on your back  Also for a mammogram

## 2018-02-20 ENCOUNTER — Ambulatory Visit
Admission: RE | Admit: 2018-02-20 | Discharge: 2018-02-20 | Disposition: A | Discharge status: Home / Self Care | Payer: Medicare Other | Source: Ambulatory Visit | Attending: Family Medicine | Admitting: Family Medicine

## 2018-02-20 DIAGNOSIS — Z1231 Encounter for screening mammogram for malignant neoplasm of breast: Secondary | ICD-10-CM

## 2018-02-21 NOTE — Assessment & Plan Note (Signed)
Disc goals for lipids and reasons to control them Rev labs with pt Rev low sat fat diet in detail  Controlled with atorvastatin and diet

## 2018-02-21 NOTE — Assessment & Plan Note (Signed)
bp in fair control at this time  BP Readings from Last 1 Encounters:  02/18/18 116/78   No changes needed Disc lifstyle change with low sodium diet and exercise  Labs reviewed  Enc exercise

## 2018-02-21 NOTE — Assessment & Plan Note (Signed)
Continues f/u with hematology

## 2018-02-21 NOTE — Assessment & Plan Note (Addendum)
Continues hematology f/u  Platelet count is 86 today  No bruising or bleeding

## 2018-02-21 NOTE — Assessment & Plan Note (Signed)
Discussed how this problem influences overall health and the risks it imposes  Reviewed plan for weight loss with lower calorie diet (via better food choices and also portion control or program like weight watchers) and exercise building up to or more than 30 minutes 5 days per week including some aerobic activity    

## 2018-02-21 NOTE — Assessment & Plan Note (Signed)
Scheduled annual screening mammogram Nl breast exam today  Encouraged monthly self exams   

## 2018-02-21 NOTE — Assessment & Plan Note (Addendum)
Lab Results  Component Value Date   HGBA1C 6.2 02/16/2018   disc imp of low glycemic diet and wt loss to prevent DM2  Handouts given

## 2018-02-21 NOTE — Assessment & Plan Note (Signed)
Due for colonoscopy Pt is unsure if she wants to schedule this - will call when ready  Offered cologuard if she decides against it  No bowel changes

## 2018-02-21 NOTE — Assessment & Plan Note (Signed)
R upper back-has irregular shape with pale area in middle Ref to dermatology

## 2018-02-24 ENCOUNTER — Encounter: Payer: Self-pay | Admitting: Sports Medicine

## 2018-02-24 ENCOUNTER — Ambulatory Visit (INDEPENDENT_AMBULATORY_CARE_PROVIDER_SITE_OTHER): Payer: Medicare Other | Admitting: Sports Medicine

## 2018-02-24 DIAGNOSIS — M79675 Pain in left toe(s): Secondary | ICD-10-CM

## 2018-02-24 DIAGNOSIS — L84 Corns and callosities: Secondary | ICD-10-CM

## 2018-02-24 DIAGNOSIS — M2042 Other hammer toe(s) (acquired), left foot: Secondary | ICD-10-CM

## 2018-02-24 DIAGNOSIS — Z9889 Other specified postprocedural states: Secondary | ICD-10-CM

## 2018-02-24 NOTE — Progress Notes (Signed)
Subjective: Kathryn Weaver is a 72 y.o. female patient seen today in office for POV #6 (DOS 12-15-17), S/P Left 2nd hammertoe repair with excision of corn and kwire. Patient denies pain at surgical site, states that she is doing good in normal shoes, denies calf pain, denies headache, chest pain, shortness of breath, nausea, vomiting, fever, or chills. No other issues noted.   Patient Active Problem List   Diagnosis Date Noted  . Change in skin mole 02/18/2018  . MGUS (monoclonal gammopathy of unknown significance) 09/17/2017  . Hyperglycemia 10/21/2016  . Somnolence, daytime 04/11/2016  . Fatigue 04/05/2016  . Internal hemorrhoid 04/20/2014  . Iron deficiency 03/16/2014  . Screening mammogram, encounter for 03/01/2014  . Uterine prolapse 03/01/2014  . Esophageal dysphagia 01/05/2013  . Stress reaction 09/01/2012  . Hyperlipidemia 04/28/2012  . Routine general medical examination at a health care facility 04/20/2012  . Obesity 02/05/2012  . Other neutropenia ([place]) 10/23/2010  . Thrombocytopenia ([place]) 10/24/2009  . POSTMENOPAUSAL STATUS 08/09/2008  . DEPRESSION 08/05/2007  . Essential hypertension 08/05/2007  . GERD 04/20/2007  . DIVERTICULAR DISEASE 04/20/2007  . History of colonic polyps 04/13/2007    Current Outpatient Medications on File Prior to Visit  Medication Sig Dispense Refill  . atorvastatin (LIPITOR) 10 MG tablet Take 1 tablet (10 mg total) by mouth daily. 90 tablet 3  . cholecalciferol (VITAMIN D) 1000 units tablet Take 5,000 Units by mouth daily.    . Esomeprazole Magnesium (NEXIUM 24HR PO) Take 22.3 mg by mouth daily.    . hydrochlorothiazide (HYDRODIURIL) 25 MG tablet Take 1 tablet (25 mg total) by mouth daily. 90 tablet 3  . Multiple Vitamins-Minerals (CENTRUM PO) Take 1 tablet by mouth 2 (two) times a week. Take one by mouth daily    . sertraline (ZOLOFT) 50 MG tablet TAKE 1 TABLETBY MOUTH EVERY DAY 90 tablet 3  . [DISCONTINUED] ranitidine (ZANTAC) 150 MG capsule  Take 1 capsule (150 mg total) by mouth 2 (two) times daily. For 3 months 60 capsule 2   No current facility-administered medications on file prior to visit.     No Known Allergies  Objective: There were no vitals filed for this visit.  General: No acute distress, AAOx3  Left foot: Incision site well healed, mild scar and mild swelling to left 2nd toe, no erythema, no warmth, no drainage, no signs of infection noted, Capillary fill time <3 seconds in all digits, gross sensation present via light touch to left foot. No pain or crepitation with range of motion left.  No pain with calf compression.   Assessment and Plan:  Problem List Items Addressed This Visit    None    Visit Diagnoses    Hammer toe of left foot    -  Primary   Corn of toe       Toe pain, left       S/P foot surgery, left           -Patient seen and evaluated -Continue with normal shoes and activities to tolerance  -Recommend again daily scar creams  -Discharged from post op care. Return as needed -In the meantime, patient to call office if any issues or problems arise.   Landis Martins, DPM

## 2018-03-18 ENCOUNTER — Ambulatory Visit: Payer: Medicare Other | Admitting: Family Medicine

## 2018-03-18 ENCOUNTER — Ambulatory Visit (INDEPENDENT_AMBULATORY_CARE_PROVIDER_SITE_OTHER): Payer: Medicare Other

## 2018-03-18 ENCOUNTER — Encounter: Payer: Self-pay | Admitting: Family Medicine

## 2018-03-18 VITALS — BP 122/68 | HR 78 | Temp 98.4°F | Wt 171.2 lb

## 2018-03-18 DIAGNOSIS — Z1211 Encounter for screening for malignant neoplasm of colon: Secondary | ICD-10-CM

## 2018-03-18 DIAGNOSIS — K5900 Constipation, unspecified: Secondary | ICD-10-CM | POA: Diagnosis not present

## 2018-03-18 DIAGNOSIS — R1032 Left lower quadrant pain: Secondary | ICD-10-CM | POA: Diagnosis not present

## 2018-03-18 LAB — CBC WITH DIFFERENTIAL/PLATELET
BASOS PCT: 0.3 % (ref 0.0–3.0)
Basophils Absolute: 0 10*3/uL (ref 0.0–0.1)
EOS ABS: 0.1 10*3/uL (ref 0.0–0.7)
EOS PCT: 1.1 % (ref 0.0–5.0)
HCT: 34.6 % — ABNORMAL LOW (ref 36.0–46.0)
HEMOGLOBIN: 11.7 g/dL — AB (ref 12.0–15.0)
LYMPHS PCT: 21.5 % (ref 12.0–46.0)
Lymphs Abs: 1.5 10*3/uL (ref 0.7–4.0)
MCHC: 33.6 g/dL (ref 30.0–36.0)
MCV: 94.8 fl (ref 78.0–100.0)
MONO ABS: 0.7 10*3/uL (ref 0.1–1.0)
Monocytes Relative: 9.8 % (ref 3.0–12.0)
NEUTROS ABS: 4.7 10*3/uL (ref 1.4–7.7)
Neutrophils Relative %: 67.3 % (ref 43.0–77.0)
PLATELETS: 153 10*3/uL (ref 150.0–400.0)
RBC: 3.66 Mil/uL — ABNORMAL LOW (ref 3.87–5.11)
RDW: 13.3 % (ref 11.5–15.5)
WBC: 7 10*3/uL (ref 4.0–10.5)

## 2018-03-18 NOTE — Patient Instructions (Signed)
Nice to see you. We will check an x-ray today to evaluate your abdomen.  We will also check a CBC to evaluate for signs of infection.  Once these return we will determine treatment for you. If you develop increased abdominal pain, nausea, vomiting, lack of bowel movement or passing gas, or any new or changing symptoms please seek medical attention immediately.

## 2018-03-18 NOTE — Assessment & Plan Note (Signed)
Symptoms most consistent with constipation.  She does not seem to be obstructed based on good bowel sounds and passing of small stools today.  Symptoms do not seem consistent with diverticulitis.  We will check a CBC to evaluate WBC.  If elevated would consider scanning her abdomen and pelvis.  We will check an abdominal plain film to evaluate for stool burden and to ensure no obstruction.  Once these return consider treating with lactulose given lack of response to the above listed medications.  She is given return precautions.

## 2018-03-18 NOTE — Progress Notes (Signed)
Tommi Rumps, MD Phone: [phone]  Kathryn Weaver is a 72 y.o. female who presents today for same-day visit.  Patient reports issues with constipation.  She reports last normal bowel movement for her was 4 to 5 days ago.  She did pass some very small hard balls yesterday and today.  She notes slight uncomfortableness in her left lower quadrant related to this.  She notes no blood in her stool.  No nausea or vomiting.  She is passing gas.  No fevers.  She is eating and drinking well.  She has tried MiraLAX, Dulcolax, and a suppository with little benefit.  She notes no change in diet.  It appears she is due for recall on her colonoscopy.  She did have diverticulosis on prior colonoscopy.  Social History   Tobacco Use  Smoking Status Never Smoker  Smokeless Tobacco Never Used     ROS see history of present illness  Objective  Physical Exam Vitals:   03/18/18 1429  BP: 122/68  Pulse: 78  Temp: 98.4 F (36.9 C)  SpO2: 94%    BP Readings from Last 3 Encounters:  03/18/18 122/68  02/18/18 116/78  01/28/18 122/70   Wt Readings from Last 3 Encounters:  03/18/18 171 lb 3.2 oz (77.7 kg)  02/18/18 172 lb 8 oz (78.2 kg)  01/28/18 174 lb 12 oz (79.3 kg)    Physical Exam  Constitutional: No distress.  Cardiovascular: Normal rate, regular rhythm and normal heart sounds.  Pulmonary/Chest: Effort normal and breath sounds normal.  Abdominal: Soft. Bowel sounds are normal. She exhibits no distension.  Slight discomfort in lower abdomen and left side of abdomen on palpation, no guarding or rebound no significant tenderness  Neurological: She is alert.  Skin: Skin is warm and dry. She is not diaphoretic.     Assessment/Plan: Please see individual problem list.  Constipation Symptoms most consistent with constipation.  She does not seem to be obstructed based on good bowel sounds and passing of small stools today.  Symptoms do not seem consistent with diverticulitis.  We will  check a CBC to evaluate WBC.  If elevated would consider scanning her abdomen and pelvis.  We will check an abdominal plain film to evaluate for stool burden and to ensure no obstruction.  Once these return consider treating with lactulose given lack of response to the above listed medications.  She is given return precautions.  Refer to GI for colonoscopy.  Orders Placed This Encounter  Procedures  . DG Abd 1 View    Standing Status:   Future    Number of Occurrences:   1    Standing Expiration Date:   05/19/2019    Order Specific Question:   Reason for Exam (SYMPTOM  OR DIAGNOSIS REQUIRED)    Answer:   constipation, abdominal discomfort, passing gas, slight tenderness    Order Specific Question:   Preferred imaging location?    Answer:   [facility] Specific Question:   Radiology Contrast Protocol - do NOT remove file path    Answer:   [email or web address]  . CBC w/Diff  . Ambulatory referral to Gastroenterology    Referral Priority:   Routine    Referral Type:   Consultation    Referral Reason:   Specialty Services Required    Number of Visits Requested:   1    No orders of the defined types were placed in this encounter.    Tommi Rumps, MD [facility] -  [employer]

## 2018-03-19 ENCOUNTER — Telehealth: Payer: Self-pay | Admitting: Family Medicine

## 2018-03-19 MED ORDER — LACTULOSE 10 GM/15ML PO SOLN: 10.0000 g | Freq: Every day | ORAL | 0 refills | Status: DC | PRN

## 2018-03-19 NOTE — Telephone Encounter (Signed)
I was awaiting her x-ray results which did not come back until sometime this morning.  Her x-ray reveals constipation.  Her lab work revealed a white blood cell count in the normal range. Based on her presentation I do not think she has infection.  I will send in lactulose for her to try for constipation.  If her symptoms worsen or she develops abdominal pain, inability to pass stool or gas or if she develops fevers or signs of infection she should be reevaluated.  Thanks.

## 2018-03-19 NOTE — Telephone Encounter (Signed)
Pt saw Dr Caryl Bis 03/18/18. FYI to [place].

## 2018-03-19 NOTE — Telephone Encounter (Signed)
Patient notified

## 2018-03-19 NOTE — Telephone Encounter (Signed)
Copied from [place] [phone]. Topic: Quick Communication - See Telephone Encounter >> Mar 19, 2018 12:00 PM Percell Belt A wrote: CRM for notification. See Telephone encounter for: 03/19/18. Pt called in and stated she was in for constipation.  She thought he was going to send in a med for her to help with it?  Please advise   Pharmacy - [facility] [address]

## 2018-03-19 NOTE — Telephone Encounter (Signed)
Please advise 

## 2018-03-23 ENCOUNTER — Ambulatory Visit: Payer: Self-pay | Admitting: *Deleted

## 2018-03-23 NOTE — Telephone Encounter (Signed)
Left VM requesting pt to call the office back CRM created 

## 2018-03-23 NOTE — Telephone Encounter (Signed)
Pt notified of Dr. Tower's comments and verbalized understanding  

## 2018-03-23 NOTE — Telephone Encounter (Signed)
See separate triage note regarding this issue (addressed and done)

## 2018-03-23 NOTE — Telephone Encounter (Signed)
Relation to pt: self Call back number: [phone] Pharmacy: [id number] - [place], [place] [phone] (Phone) [phone] (Fax)    Reason for call:  Patient seen by Dr. Caryl Bis 03/18/18 and states lactulose ([place]) 10 GM/15ML solution medication prescribe is not working for the symptoms mentioned below, patient would like to try "LINZESS"  please advise

## 2018-03-23 NOTE — Telephone Encounter (Signed)
miralax is the safest thing to use  It can be taken daily or prn or more than once daily if needed to control constipation -so she can judge based on her response

## 2018-03-23 NOTE — Telephone Encounter (Signed)
Please see Dr Ellen Henri note on this phone message on 03/19/18. Does pt need appt?

## 2018-03-23 NOTE — Telephone Encounter (Signed)
How much/how often has she tried it  Any bowel movement at all  ? Any severe abd pain or fever   linzess is usually reserved for chronic use but let me know

## 2018-03-23 NOTE — Telephone Encounter (Signed)
She called in returning a call from the office regarding constipation.    She saw Dr. Caryl Bis for constipation and was prescribed Lactulose.   She said it "bubbled up in my stomach".   I tried using some OTC Miralax and had a good BM. Can I keep using it?   I let her know I would send a note to Dr. Glori Bickers, her PCP, letting her know the Miralax worked and can she continue using it when she needs it.      Reason for Disposition . Taking new prescription medication    Saw Dr. Caryl Bis for constipation and was prescribed Lactulose.   Dr. Glori Bickers is following up on the results of taking it.  Answer Assessment - Initial Assessment Questions 1. STOOL PATTERN OR FREQUENCY: "How often do you pass bowel movements (BMs)?"  (Normal range: tid to q 3 days)  "When was the last BM passed?"       Last Wednesday I saw Dr. Caryl Bis for constipation.   The new medication just "bubbling up in my stomach".     I went a little bit this morning.   I took Miralax Saturday night and had a good BM.   The OTC Miralax did better than the medicine Dr. Caryl Bis gave me. 2. STRAINING: "Do you have to strain to have a BM?"      No straining with the Mirapex. 3. RECTAL PAIN: "Does your rectum hurt when the stool comes out?" If so, ask: "Do you have hemorrhoids? How bad is the pain?"  (Scale 1-10; or mild, moderate, severe)     I'm eating oatmeal and oranges to help me go.    No pain. 4. STOOL COMPOSITION: "Are the stools hard?"      It was not hard. 5. BLOOD ON STOOLS: "Has there been any blood on the toilet tissue or on the surface of the BM?" If so, ask: "When was the last time?"      *No Answer* 6. CHRONIC CONSTIPATION: "Is this a new problem for you?"  If no, ask: How long have you had this problem?" (days, weeks, months)      Never been constipation until now.    7. CHANGES IN DIET: "Have there been any recent changes in your diet?"      I'm eating oranges and oatmeal. 8. MEDICATIONS: "Have you been taking any  new medications?"     Just the medicine (Lactulose) that Dr. Caryl Bis prescribed "that bubbled up in my stomach". 9. LAXATIVES: "Have you been using any laxatives or enemas?"  If yes, ask "What, how often, and when was the last time?"     No  10. CAUSE: "What do you think is causing the constipation?"        I don't know.   Never had this problem before in my life. 11. OTHER SYMPTOMS: "Do you have any other symptoms?" (e.g., abdominal pain, fever, vomiting)       No abd pain. 12. PREGNANCY: "Is there any chance you are pregnant?" "When was your last menstrual period?"       Not asked due to age.  Protocols used: CONSTIPATION-A-AH

## 2018-03-25 ENCOUNTER — Inpatient Hospital Stay (HOSPITAL_COMMUNITY): Payer: Medicare Other | Attending: Hematology

## 2018-03-25 ENCOUNTER — Ambulatory Visit (HOSPITAL_COMMUNITY): Payer: Medicare Other | Admitting: Internal Medicine

## 2018-03-25 ENCOUNTER — Ambulatory Visit (HOSPITAL_COMMUNITY): Payer: Medicare Other | Admitting: Adult Health

## 2018-03-25 ENCOUNTER — Other Ambulatory Visit (HOSPITAL_COMMUNITY): Payer: Medicare Other

## 2018-03-25 DIAGNOSIS — I10 Essential (primary) hypertension: Secondary | ICD-10-CM | POA: Diagnosis not present

## 2018-03-25 DIAGNOSIS — R5382 Chronic fatigue, unspecified: Secondary | ICD-10-CM | POA: Diagnosis not present

## 2018-03-25 DIAGNOSIS — D72819 Decreased white blood cell count, unspecified: Secondary | ICD-10-CM | POA: Diagnosis not present

## 2018-03-25 DIAGNOSIS — D472 Monoclonal gammopathy: Secondary | ICD-10-CM | POA: Insufficient documentation

## 2018-03-25 DIAGNOSIS — D696 Thrombocytopenia, unspecified: Secondary | ICD-10-CM | POA: Diagnosis not present

## 2018-03-25 LAB — CBC WITH DIFFERENTIAL/PLATELET
BASOS ABS: 0 10*3/uL (ref 0.0–0.1)
BASOS PCT: 0 %
EOS ABS: 0.1 10*3/uL (ref 0.0–0.7)
EOS PCT: 2 %
HCT: 36 % (ref 36.0–46.0)
Hemoglobin: 11.4 g/dL — ABNORMAL LOW (ref 12.0–15.0)
LYMPHS PCT: 35 %
Lymphs Abs: 1.9 10*3/uL (ref 0.7–4.0)
MCH: 31.1 pg (ref 26.0–34.0)
MCHC: 31.7 g/dL (ref 30.0–36.0)
MCV: 98.4 fL (ref 78.0–100.0)
MONO ABS: 0.5 10*3/uL (ref 0.1–1.0)
Monocytes Relative: 9 %
Neutro Abs: 2.9 10*3/uL (ref 1.7–7.7)
Neutrophils Relative %: 54 %
PLATELETS: 166 10*3/uL (ref 150–400)
RBC: 3.66 MIL/uL — ABNORMAL LOW (ref 3.87–5.11)
RDW: 12.7 % (ref 11.5–15.5)
WBC: 5.4 10*3/uL (ref 4.0–10.5)

## 2018-03-25 LAB — COMPREHENSIVE METABOLIC PANEL
ALT: 22 U/L (ref 14–54)
AST: 23 U/L (ref 15–41)
Albumin: 3 g/dL — ABNORMAL LOW (ref 3.5–5.0)
Alkaline Phosphatase: 74 U/L (ref 38–126)
Anion gap: 9 (ref 5–15)
BUN: 15 mg/dL (ref 6–20)
CHLORIDE: 100 mmol/L — AB (ref 101–111)
CO2: 31 mmol/L (ref 22–32)
Calcium: 10.3 mg/dL (ref 8.9–10.3)
Creatinine, Ser: 0.91 mg/dL (ref 0.44–1.00)
GFR calc Af Amer: >60 mL/min (ref >60)
Glucose, Bld: 98 mg/dL (ref 65–99)
POTASSIUM: 3.6 mmol/L (ref 3.5–5.1)
SODIUM: 140 mmol/L (ref 135–145)
TOTAL PROTEIN: 8.4 g/dL — AB (ref 6.5–8.1)
Total Bilirubin: 0.5 mg/dL (ref 0.3–1.2)

## 2018-03-26 LAB — KAPPA/LAMBDA LIGHT CHAINS
Kappa free light chain: 16.9 mg/L (ref 3.3–19.4)
Kappa, lambda light chain ratio: 0.56 (ref 0.26–1.65)
Lambda free light chains: 30.3 mg/L — ABNORMAL HIGH (ref 5.7–26.3)

## 2018-03-31 LAB — MULTIPLE MYELOMA PANEL, SERUM
ALBUMIN/GLOB SERPL: 0.7 (ref 0.7–1.7)
ALPHA 1: 0.3 g/dL (ref 0.0–0.4)
Albumin SerPl Elph-Mcnc: 3 g/dL (ref 2.9–4.4)
Alpha2 Glob SerPl Elph-Mcnc: 1.2 g/dL — ABNORMAL HIGH (ref 0.4–1.0)
B-Globulin SerPl Elph-Mcnc: 0.8 g/dL (ref 0.7–1.3)
GLOBULIN, TOTAL: 5 g/dL — AB (ref 2.2–3.9)
Gamma Glob SerPl Elph-Mcnc: 2.7 g/dL — ABNORMAL HIGH (ref 0.4–1.8)
IGA: 64 mg/dL — AB (ref 87–352)
IgG (Immunoglobin G), Serum: 701 mg/dL (ref 700–1600)
IgM (Immunoglobulin M), Srm: 3127 mg/dL — ABNORMAL HIGH (ref 26–217)
M Protein SerPl Elph-Mcnc: 2 g/dL — ABNORMAL HIGH
Total Protein ELP: 8 g/dL (ref 6.0–8.5)

## 2018-04-01 ENCOUNTER — Other Ambulatory Visit: Payer: Self-pay

## 2018-04-01 ENCOUNTER — Inpatient Hospital Stay (HOSPITAL_BASED_OUTPATIENT_CLINIC_OR_DEPARTMENT_OTHER): Payer: Medicare Other | Admitting: Internal Medicine

## 2018-04-01 VITALS — BP 136/77 | HR 95 | Temp 98.3°F | Resp 18 | Ht 63.0 in | Wt 171.1 lb

## 2018-04-01 DIAGNOSIS — I10 Essential (primary) hypertension: Secondary | ICD-10-CM | POA: Diagnosis not present

## 2018-04-01 DIAGNOSIS — R5383 Other fatigue: Secondary | ICD-10-CM

## 2018-04-01 DIAGNOSIS — R5382 Chronic fatigue, unspecified: Secondary | ICD-10-CM

## 2018-04-01 DIAGNOSIS — D472 Monoclonal gammopathy: Secondary | ICD-10-CM

## 2018-04-01 DIAGNOSIS — D696 Thrombocytopenia, unspecified: Secondary | ICD-10-CM | POA: Diagnosis not present

## 2018-04-01 DIAGNOSIS — D72819 Decreased white blood cell count, unspecified: Secondary | ICD-10-CM

## 2018-04-01 NOTE — Patient Instructions (Signed)
[place] [facility]  Discharge Instructions:  Today you say Dr. Walden Field.  _______________________________________________________________  Thank you for choosing [place] [facility] to provide your oncology and hematology care.  To afford each patient quality time with our providers, please arrive at least 15 minutes before your scheduled appointment.  You need to re-schedule your appointment if you arrive 10 or more minutes late.  We strive to give you quality time with our providers, and arriving late affects you and other patients whose appointments are after yours.  Also, if you no show three or more times for appointments you may be dismissed from the clinic.  Again, thank you for choosing [place] [facility] hope is that these requests will allow you access to exceptional care and in a timely manner. _______________________________________________________________  If you have questions after your visit, please contact our office at [phone] between the hours of 8:30 a.m. and 5:00 p.m. Voicemails left after 4:30 p.m. will not be returned until the following business day. _______________________________________________________________  For prescription refill requests, have your pharmacy contact our office. _______________________________________________________________  Recommendations made by the consultant and any test results will be sent to your referring physician. _______________________________________________________________

## 2018-04-01 NOTE — Progress Notes (Signed)
Diagnosis MGUS (monoclonal gammopathy of unknown significance) - Plan: CBC with Differential/Platelet, Comprehensive metabolic panel, Lactate dehydrogenase, Protein electrophoresis, serum, Ferritin, Beta 2 microglobulin, serum, Kappa/lambda light chains, IgG, IgA, IgM, CANCELED: CBC with Differential/Platelet, CANCELED: Comprehensive metabolic panel, CANCELED: Ferritin, CANCELED: Lactate dehydrogenase, CANCELED: Protein electrophoresis, serum, CANCELED: IgG, IgA, IgM, CANCELED: Kappa/lambda light chains, CANCELED: Beta 2 microglobulin, serum  Lethargy - Plan: Polysomnography 4 or more parameters  Staging Cancer Staging No matching staging information was found for the patient.  Assessment and Plan: 1.  MGUS with IgM monoclonal protein with lambda light chains and 3% plasma cells on bone marrow.  Pt was previously followed by Dr. Talbert Cage.  SPEP revealed an IgM monoclonal protein with lambda light chains. Bone marrow biopsy was negative for excess lymphoplasmacytoid cells as seen in [place] macroglobulinemia. She is suspected of having IgM MGUS.    Labs done 03/25/2018 show Kappa/lambda light chains ratio normal, SPEP stable at 2 g/dl,  IGM 3127, Cr 0.91, Calcium 10.3   CBC shows WBC 5.4, HB 11.4 plts 166,000.  She will continue close follow-up and will have repeat labs done in 4 months for ongoing monitoring.  Last skeletal survey was done 08/2011 and showed no lytic lesions.  Will check serum viscosity on RTC.    2.  Leukopenia and thrombocytopenia.  CBC done 03/25/2018 shows WBC 5.4 hb 11.4 plts 166,000.  Will repeat labs in 4 months.    3.  Fatigue.  Pt reports excessive fatigue.    Pt had TSH done 02/2018 and level was normal at 2.82.  She is set up for sleep study evaluation and cardiology referral.  HB 11.4 on labs done 03/25/2018.  4.  HTN.  BP is 136/77.  Follow-up with PCP.   5  Health maintenance.  Continue mammogram and colonoscopy evaluation as recommended.    Current Status:  Pt is  seen today for follow-up.  She is complaining of chronic fatigue.    PATHOLOGY: Patient: Kathryn Weaver, Kathryn Weaver Collected: 05/26/2017 Client: [facility] Accession: [id number] Received: 05/26/2017 John "Ronny Bacon, MD DOB: 23-Apr-1946 Age: 72 Gender: F Reported: 05/29/2017 [place] Patient Ph: [phone] MRN #: [id number] [place],  [address] Visit #: [id number] Chart #: Phone: [phone] Fax: CC: Twana First, MD BONE MARROW REPORT FINAL DIAGNOSIS Diagnosis Bone Marrow, Aspirate,Biopsy, and Clot, left iliac crest - HYPERCELLULAR BONE MARROW FOR AGE WITH TRILINEAGE HEMATOPOIESIS. - A MINOR POPULATION OF MONOCLONAL PLASMA CELLS. - SLIGHT LYMPHOCYTOSIS. - SEE COMMENT. PERIPHERAL BLOOD: - LEUKOPENIA. - THROMBOCYTOPENIA. Diagnosis Note The bone marrow is hypercellular for age with trilineage hematopoiesis and non specific myeloid changes. Significant dyspoiesis or increase in blastic cells is not identified. The plasma cells represent 3% of all cells in the aspirate associated with scattered small clusters in the clot and biopsy sections. Immunohistochemical stains show that the plasma cells are lambda light chain restricted. Despite limited findings, the changes are most consistent with early involvement by plasma cell dyscrasia/neoplasm. The lymphocytic component is slightly increased in number (23%) and mostly consisting of small lymphoid cells. The lymphocytosis is primarily interstitial and only a rare minute lymphoid aggregate is seen in the core biopsy/clot sections. Flow cytometric analysis shows predominance of T lymphocytes with no abnormal phenotype. B cells represent a minor population with slight kappa light chain excess but no definite monoclonality or abnormal phenotype. Immunohistochemical stains were also performed and failed to show a significant increase in B cells or an apparently abnormal T cell lymphoid component. Hence the lymphocytosis is  not necessarily a part of the plasma cell neoplastic process and is likely reactive in nature. Correlation with cytogenetic studies is recommended. (BNS:gt, 2017/06/02) Susanne Greenhouse MD Pathologist, Electronic Signature  Interpretation Bone Marrow Flow Cytometry - PREDOMINANCE OF T LYMPHOCYTES WITH NO ABNORMAL PHENOTYPE. - MINOR B-CELL POPULATION PRESENT. - SEE NOTE. Diagnosis Comment: Analysis of the lymphoid population shows predominance of T lymphocytes expressing pan T-cell antigens with no abnormal phenotype. B-cells represent a minor population (20% of lymphocytes) with expression of pan B-cell antigens associated with slight kappa light chain excess, but no definite monoclonality. In addition, an abnormal B-cell phenotype such as expression of CD5, CD10, or CD103 is not identified. The findings are not considered specific or diagnostic of a lymphoproliferative process. Clinical correlation is recommended. (BNS:ah 05/29/17) Susanne Greenhouse MD Pathologist, Electronic Signature  Problem List Patient Active Problem List   Diagnosis Date Noted  . Constipation [K59.00] 03/18/2018  . Change in skin mole [D22.9] 02/18/2018  . MGUS (monoclonal gammopathy of unknown significance) [D47.2] 09/17/2017  . Hyperglycemia [R73.9] 10/21/2016  . Somnolence, daytime [R40.0] 04/11/2016  . Fatigue [R53.83] 04/05/2016  . Internal hemorrhoid [K64.8] 04/20/2014  . Iron deficiency [E61.1] 03/16/2014  . Screening mammogram, encounter for [Z12.31] 03/01/2014  . Uterine prolapse [N81.4] 03/01/2014  . Esophageal dysphagia [R13.10] 01/05/2013  . Stress reaction [F43.0] 09/01/2012  . Hyperlipidemia [E78.5] 04/28/2012  . Routine general medical examination at a health care facility [Z00.00] 04/20/2012  . Obesity [E66.9] 02/05/2012  . Other neutropenia ([place]) [D70.8] 10/23/2010  . Thrombocytopenia ([place]) [D69.6] 10/24/2009  . POSTMENOPAUSAL STATUS [N95.1] 08/09/2008  . DEPRESSION [F32.9] 08/05/2007  . Essential  hypertension [I10] 08/05/2007  . GERD [K21.9] 04/20/2007  . DIVERTICULAR DISEASE [K57.30] 04/20/2007  . History of colonic polyps [Z86.010] 04/13/2007    Past Medical History Past Medical History:  Diagnosis Date  . Adenomatous polyp of colon 03/2007  . Anemia    s/p GI bleed, hospital 5/08  . Depression   . Diverticulosis   . Endometriosis    uterine US 06/2003  . GERD (gastroesophageal reflux disease)   . Hiatal hernia    EGD 5/08- stricture, HH, gastritis, GERD  . Hypertension   . Hypertension   . Iron deficiency 03/16/2014    Past Surgical History Past Surgical History:  Procedure Laterality Date  . cardiac CT  9/12   normal   . HAMMER TOE SURGERY Left 12/02/2017  . TUBAL LIGATION      Family History Family History  Problem Relation Age of Onset  . Cancer Father        unknown type  . Hypertension Mother   . Colon cancer Neg Hx   . Esophageal cancer Neg Hx   . Rectal cancer Neg Hx   . Stomach cancer Neg Hx      Social History  reports that she has never smoked. She has never used smokeless tobacco. She reports that she does not drink alcohol or use drugs.  Medications  Current Outpatient Medications:  .  atorvastatin (LIPITOR) 10 MG tablet, Take 1 tablet (10 mg total) by mouth daily., Disp: 90 tablet, Rfl: 3 .  cholecalciferol (VITAMIN D) 1000 units tablet, Take 5,000 Units by mouth daily., Disp: , Rfl:  .  Esomeprazole Magnesium (NEXIUM 24HR PO), Take 22.3 mg by mouth daily., Disp: , Rfl:  .  hydrochlorothiazide (HYDRODIURIL) 25 MG tablet, Take 1 tablet (25 mg total) by mouth daily., Disp: 90 tablet, Rfl: 3 .  lactulose (CHRONULAC) 10 GM/15ML solution, Take 15 mLs (  10 g total) by mouth daily as needed for mild constipation., Disp: 240 mL, Rfl: 0 .  Multiple Vitamins-Minerals (CENTRUM PO), Take 1 tablet by mouth 2 (two) times a week. Take one by mouth daily, Disp: , Rfl:  .  sertraline (ZOLOFT) 50 MG tablet, TAKE 1 TABLETBY MOUTH EVERY DAY, Disp: 90 tablet,  Rfl: 3  Allergies Patient has no known allergies.  Review of Systems Review of Systems - Oncology ROS as per HPI otherwise 12 point ROS is negative.   Physical Exam  Vitals Wt Readings from Last 3 Encounters:  04/01/18 171 lb 1.6 oz (77.6 kg)  03/18/18 171 lb 3.2 oz (77.7 kg)  02/18/18 172 lb 8 oz (78.2 kg)   Temp Readings from Last 3 Encounters:  04/01/18 98.3 F (36.8 C) (Oral)  03/18/18 98.4 F (36.9 C) (Oral)  02/18/18 98 F (36.7 C) (Oral)   BP Readings from Last 3 Encounters:  04/01/18 136/77  03/18/18 122/68  02/18/18 116/78   Pulse Readings from Last 3 Encounters:  04/01/18 95  03/18/18 78  02/18/18 60   Constitutional: Well-developed, well-nourished, and in no distress.   HENT: Head: Normocephalic and atraumatic.  Mouth/Throat: No oropharyngeal exudate. Mucosa moist. Eyes: Pupils are equal, round, and reactive to light. Conjunctivae are normal. No scleral icterus.  Neck: Normal range of motion. Neck supple. No JVD present.  Cardiovascular: Normal rate, regular rhythm and normal heart sounds.  Exam reveals no gallop and no friction rub.   No murmur heard. Pulmonary/Chest: Effort normal and breath sounds normal. No respiratory distress. No wheezes.No rales.  Abdominal: Soft. Bowel sounds are normal. No distension. There is no tenderness. There is no guarding.  Musculoskeletal: No edema or tenderness.  Lymphadenopathy: No cervical, axillary or supraclavicular adenopathy.  Neurological: Alert and oriented to person, place, and time. No cranial nerve deficit.  Skin: Skin is warm and dry. No rash noted. No erythema. No pallor.  Psychiatric: Affect and judgment normal.   Labs No visits with results within 3 Day(s) from this visit.  Latest known visit with results is:  Appointment on 03/25/2018  Component Date Value Ref Range Status  . WBC 03/25/2018 5.4  4.0 - 10.5 K/uL Final  . RBC 03/25/2018 3.66* 3.87 - 5.11 MIL/uL Final  . Hemoglobin 03/25/2018 11.4*  12.0 - 15.0 g/dL Final  . HCT 03/25/2018 36.0  36.0 - 46.0 % Final  . MCV 03/25/2018 98.4  78.0 - 100.0 fL Final  . MCH 03/25/2018 31.1  26.0 - 34.0 pg Final  . MCHC 03/25/2018 31.7  30.0 - 36.0 g/dL Final  . RDW 03/25/2018 12.7  11.5 - 15.5 % Final  . Platelets 03/25/2018 166  150 - 400 K/uL Final  . Neutrophils Relative % 03/25/2018 54  % Final  . Neutro Abs 03/25/2018 2.9  1.7 - 7.7 K/uL Final  . Lymphocytes Relative 03/25/2018 35  % Final  . Lymphs Abs 03/25/2018 1.9  0.7 - 4.0 K/uL Final  . Monocytes Relative 03/25/2018 9  % Final  . Monocytes Absolute 03/25/2018 0.5  0.1 - 1.0 K/uL Final  . Eosinophils Relative 03/25/2018 2  % Final  . Eosinophils Absolute 03/25/2018 0.1  0.0 - 0.7 K/uL Final  . Basophils Relative 03/25/2018 0  % Final  . Basophils Absolute 03/25/2018 0.0  0.0 - 0.1 K/uL Final   Performed at [facility], [address], [place], [place] [address]  . Sodium 03/25/2018 140  135 - 145 mmol/L Final  . Potassium 03/25/2018 3.6  3.5 - 5.1 mmol/L Final  . Chloride 03/25/2018 100* 101 - 111 mmol/L Final  . CO2 03/25/2018 31  22 - 32 mmol/L Final  . Glucose, Bld 03/25/2018 98  65 - 99 mg/dL Final  . BUN 03/25/2018 15  6 - 20 mg/dL Final  . Creatinine, Ser 03/25/2018 0.91  0.44 - 1.00 mg/dL Final  . Calcium 03/25/2018 10.3  8.9 - 10.3 mg/dL Final  . Total Protein 03/25/2018 8.4* 6.5 - 8.1 g/dL Final  . Albumin 03/25/2018 3.0* 3.5 - 5.0 g/dL Final  . AST 03/25/2018 23  15 - 41 U/L Final  . ALT 03/25/2018 22  14 - 54 U/L Final  . Alkaline Phosphatase 03/25/2018 74  38 - 126 U/L Final  . Total Bilirubin 03/25/2018 0.5  0.3 - 1.2 mg/dL Final  . GFR calc non Af Amer 03/25/2018 >60  >60 mL/min Final  . GFR calc Af Amer 03/25/2018 >60  >60 mL/min Final   Comment: (NOTE) The eGFR has been calculated using the CKD EPI equation. This calculation has not been validated in all clinical situations. eGFR's persistently <60 mL/min signify possible Chronic Kidney Disease.   Georgiann Hahn gap 03/25/2018 9  5 - 15 Final   Performed at [facility], [address], [place], [place] [address]  . IgG (Immunoglobin G), Serum 03/25/2018 701  700 - 1,600 mg/dL Final  . IgA 03/25/2018 64* 87 - 352 mg/dL Final  . IgM (Immunoglobulin M), Srm 03/25/2018 3,127* 26 - 217 mg/dL Final   Comment: (NOTE) Results confirmed on dilution.   . Total Protein ELP 03/25/2018 8.0  6.0 - 8.5 g/dL Corrected  . Albumin SerPl Elph-Mcnc 03/25/2018 3.0  2.9 - 4.4 g/dL Corrected  . Alpha 1 03/25/2018 0.3  0.0 - 0.4 g/dL Corrected  . Alpha2 Glob SerPl Elph-Mcnc 03/25/2018 1.2* 0.4 - 1.0 g/dL Corrected  . B-Globulin SerPl Elph-Mcnc 03/25/2018 0.8  0.7 - 1.3 g/dL Corrected  . Gamma Glob SerPl Elph-Mcnc 03/25/2018 2.7* 0.4 - 1.8 g/dL Corrected  . M Protein SerPl Elph-Mcnc 03/25/2018 2.0* Not Observed g/dL Corrected  . Globulin, Total 03/25/2018 5.0* 2.2 - 3.9 g/dL Corrected  . Albumin/Glob SerPl 03/25/2018 0.7  0.7 - 1.7 Corrected  . IFE 1 03/25/2018 Comment   Corrected   Comment: (NOTE) Immunofixation shows IgM monoclonal protein with lambda light chain specificity.   . Please Note 03/25/2018 Comment   Corrected   Comment: (NOTE) Protein electrophoresis scan will follow via computer, mail, or courier delivery. Performed At: [facility] [place], [place] [id number] Rush Farmer MD [id number] Performed at [facility], [address], [place], [place] [address]   . Kappa free light chain 03/25/2018 16.9  3.3 - 19.4 mg/L Final  . Lamda free light chains 03/25/2018 30.3* 5.7 - 26.3 mg/L Final  . Kappa, lamda light chain ratio 03/25/2018 0.56  0.26 - 1.65 Final   Comment: (NOTE) Performed At: [facility] [place], [place] [id number] Rush Farmer MD [id number] Performed at [facility], [address] [place], [place] [address]      Pathology Orders Placed This Encounter  Procedures  . CBC with Differential/Platelet    Standing Status:    Future    Standing Expiration Date:   04/02/2019  . Comprehensive metabolic panel    Standing Status:   Future    Standing Expiration Date:   04/02/2019  . Lactate dehydrogenase    Standing Status:   Future    Standing Expiration  Date:   04/02/2019  . Protein electrophoresis, serum    Standing Status:   Future    Standing Expiration Date:   04/02/2019  . Ferritin    Standing Status:   Future    Standing Expiration Date:   04/02/2019  . Beta 2 microglobulin, serum    Standing Status:   Future    Standing Expiration Date:   04/02/2019  . Kappa/lambda light chains    Standing Status:   Future    Standing Expiration Date:   04/02/2019  . IgG, IgA, IgM    Standing Status:   Future    Standing Expiration Date:   04/02/2019  . Polysomnography 4 or more parameters    Pt reports excessive fatigue and desires to sleep all the time    Standing Status:   Future    Standing Expiration Date:   04/02/2019    Order Specific Question:   Where should this test be performed:    Answer:   [facility]       Zoila Shutter MD

## 2018-04-22 ENCOUNTER — Other Ambulatory Visit: Payer: Self-pay | Admitting: Family Medicine

## 2018-04-22 ENCOUNTER — Encounter: Payer: Self-pay | Admitting: Primary Care

## 2018-04-22 ENCOUNTER — Ambulatory Visit (INDEPENDENT_AMBULATORY_CARE_PROVIDER_SITE_OTHER): Payer: Medicare Other | Admitting: Primary Care

## 2018-04-22 VITALS — BP 116/70 | HR 93 | Temp 98.2°F | Ht 63.0 in | Wt 165.8 lb

## 2018-04-22 DIAGNOSIS — K5792 Diverticulitis of intestine, part unspecified, without perforation or abscess without bleeding: Secondary | ICD-10-CM

## 2018-04-22 MED ORDER — METRONIDAZOLE 500 MG PO TABS: 500.0000 mg | ORAL_TABLET | Freq: Three times a day (TID) | ORAL | 0 refills | Status: DC

## 2018-04-22 MED ORDER — CIPROFLOXACIN HCL 500 MG PO TABS: 500.0000 mg | ORAL_TABLET | Freq: Two times a day (BID) | ORAL | 0 refills | Status: DC

## 2018-04-22 NOTE — Progress Notes (Signed)
Subjective:    Patient ID: Early Chars, female    DOB: [birth year], 72 y.o.   MRN: [id number]  HPI  Ms. Melena is a 72 year old female who presents today with a history of GERD, diverticular disease, uterine prolapse, constipation who presents today with a chief complaint of abdominal pain.  Her last colonoscopy was in 2013 which revealed moderate diverticulosis to the descending sigmoid colon. She was evaluated on 03/18/18 at [facility] for complaints of constipation and firm stools, LLQ discomfort. She underwent plain films of the abdomen which were consistent for constipation. She was treated with Lactulose.  Her pain is located to the left lower quadrant. She describes her pain as an intermittent cramping/"knawing pain". She has a history of diverticulitis years ago, this feels similar. The lactulose make her feel horrible so she started Miralax. Since taking Miralax her constipation has resolved but her pain persists. She's taken Aleve without improvement.   She denies fevers, urinary symptoms, vaginal symptoms, nausea, vomiting, diarrhea. She does have an overall decrease in appetite.   Review of Systems  Constitutional: Positive for appetite change. Negative for fever.  Respiratory: Negative for shortness of breath.   Cardiovascular: Negative for chest pain.  Gastrointestinal: Positive for abdominal pain. Negative for blood in stool, constipation, diarrhea, nausea and vomiting.       Past Medical History:  Diagnosis Date  . Adenomatous polyp of colon 03/2007  . Anemia    s/p GI bleed, hospital 5/08  . Depression   . Diverticulosis   . Endometriosis    uterine US 06/2003  . GERD (gastroesophageal reflux disease)   . Hiatal hernia    EGD 5/08- stricture, HH, gastritis, GERD  . Hypertension   . Hypertension   . Iron deficiency 03/16/2014     Social History   Socioeconomic History  . Marital status: Married    Spouse name: Not on file  . Number of children: Not  on file  . Years of education: Not on file  . Highest education level: Not on file  Occupational History  . Occupation: [employer]  . Financial resource strain: Not on file  . Food insecurity:    Worry: Not on file    Inability: Not on file  . Transportation needs:    Medical: Not on file    Non-medical: Not on file  Tobacco Use  . Smoking status: Never Smoker  . Smokeless tobacco: Never Used  Substance and Sexual Activity  . Alcohol use: No    Alcohol/week: 0.0 oz  . Drug use: No  . Sexual activity: Yes  Lifestyle  . Physical activity:    Days per week: Not on file    Minutes per session: Not on file  . Stress: Not on file  Relationships  . Social connections:    Talks on phone: Not on file    Gets together: Not on file    Attends religious service: Not on file    Active member of club or organization: Not on file    Attends meetings of clubs or organizations: Not on file    Relationship status: Not on file  . Intimate partner violence:    Fear of current or ex partner: Not on file    Emotionally abused: Not on file    Physically abused: Not on file    Forced sexual activity: Not on file  Other Topics Concern  . Not on file  Social History Narrative  .  Not on file    Past Surgical History:  Procedure Laterality Date  . cardiac CT  9/12   normal   . HAMMER TOE SURGERY Left 12/02/2017  . TUBAL LIGATION      Family History  Problem Relation Age of Onset  . Cancer Father        unknown type  . Hypertension Mother   . Colon cancer Neg Hx   . Esophageal cancer Neg Hx   . Rectal cancer Neg Hx   . Stomach cancer Neg Hx     No Known Allergies  Current Outpatient Medications on File Prior to Visit  Medication Sig Dispense Refill  . atorvastatin (LIPITOR) 10 MG tablet Take 1 tablet (10 mg total) by mouth daily. 90 tablet 3  . cholecalciferol (VITAMIN D) 1000 units tablet Take 5,000 Units by mouth daily.    . Esomeprazole Magnesium  (NEXIUM 24HR PO) Take 22.3 mg by mouth daily.    . hydrochlorothiazide (HYDRODIURIL) 25 MG tablet Take 1 tablet (25 mg total) by mouth daily. 90 tablet 3  . Multiple Vitamins-Minerals (CENTRUM PO) Take 1 tablet by mouth 2 (two) times a week. Take one by mouth daily    . sertraline (ZOLOFT) 50 MG tablet TAKE 1 TABLETBY MOUTH EVERY DAY 90 tablet 3  . [DISCONTINUED] ranitidine (ZANTAC) 150 MG capsule Take 1 capsule (150 mg total) by mouth 2 (two) times daily. For 3 months 60 capsule 2   No current facility-administered medications on file prior to visit.     BP 116/70   Pulse 93   Temp 98.2 F (36.8 C) (Oral)   Ht 5\' 3"  (1.6 m)   Wt 165 lb 12 oz (75.2 kg)   SpO2 99%   BMI 29.36 kg/m    Objective:   Physical Exam  Constitutional: She appears well-nourished.  Neck: Neck supple.  Cardiovascular: Normal rate and regular rhythm.  Respiratory: Effort normal and breath sounds normal.  GI: Soft. Normal appearance and bowel sounds are normal. There is tenderness in the left lower quadrant. There is no rebound, no tenderness at McBurney's point and negative Murphy's sign.    Skin: Skin is warm and dry.           Assessment & Plan:  Abdominal Pain:  Located to LLQ x 2 weeks, no improvement after constipation resolved. Exam today with moderate tenderness to LLQ, doesn't appear sickly/ Given history of diverticulitis, evidence of diverticulosis on colonoscopy, exam findings will treat for presumed diverticulitis.  Rx for Cipro and Flagyl course sent to pharmacy. Discussed bland diet vs clear liquids.  She will follow up with PCP Monday next week. She is stable for outpatient treatment.  Pleas Koch, NP

## 2018-04-22 NOTE — Patient Instructions (Signed)
Start ciprofloxacin antibiotics. Take 1 tablet by mouth twice daily for 10 days.  Start metronidazole antibiotics. Take 1 tablet by mouth three times daily for 10 days.  Make sure to eat a bland diet, nothing spicy/acidic. Do not eat nuts/seeds/popcorn.  Make sure to drink plenty of water.  Schedule a follow up visit on Monday with Dr. Glori Bickers.   It was a pleasure to see you today!    Diverticulitis Diverticulitis is infection or inflammation of small pouches (diverticula) in the colon that form due to a condition called diverticulosis. Diverticula can trap stool (feces) and bacteria, causing infection and inflammation. Diverticulitis may cause severe stomach pain and diarrhea. It may lead to tissue damage in the colon that causes bleeding. The diverticula may also burst (rupture) and cause infected stool to enter other areas of the abdomen. Complications of diverticulitis can include:  Bleeding.  Severe infection.  Severe pain.  Rupture (perforation) of the colon.  Blockage (obstruction) of the colon.  What are the causes? This condition is caused by stool becoming trapped in the diverticula, which allows bacteria to grow in the diverticula. This leads to inflammation and infection. What increases the risk? You are more likely to develop this condition if:  You have diverticulosis. The risk for diverticulosis increases if: ? You are overweight or obese. ? You use tobacco products. ? You do not get enough exercise.  You eat a diet that does not include enough fiber. High-fiber foods include fruits, vegetables, beans, nuts, and whole grains.  What are the signs or symptoms? Symptoms of this condition may include:  Pain and tenderness in the abdomen. The pain is normally located on the left side of the abdomen, but it may occur in other areas.  Fever and chills.  Bloating.  Cramping.  Nausea.  Vomiting.  Changes in bowel routines.  Blood in your stool.  How is  this diagnosed? This condition is diagnosed based on:  Your medical history.  A physical exam.  Tests to make sure there is nothing else causing your condition. These tests may include: ? Blood tests. ? Urine tests. ? Imaging tests of the abdomen, including X-rays, ultrasounds, MRIs, or CT scans.  How is this treated? Most cases of this condition are mild and can be treated at home. Treatment may include:  Taking over-the-counter pain medicines.  Following a clear liquid diet.  Taking antibiotic medicines by mouth.  Rest.  More severe cases may need to be treated at a hospital. Treatment may include:  Not eating or drinking.  Taking prescription pain medicine.  Receiving antibiotic medicines through an IV tube.  Receiving fluids and nutrition through an IV tube.  Surgery.  When your condition is under control, your health care provider may recommend that you have a colonoscopy. This is an exam to look at the entire large intestine. During the exam, a lubricated, bendable tube is inserted into the anus and then passed into the rectum, colon, and other parts of the large intestine. A colonoscopy can show how severe your diverticula are and whether something else may be causing your symptoms. Follow these instructions at home: Medicines  Take over-the-counter and prescription medicines only as told by your health care provider. These include fiber supplements, probiotics, and stool softeners.  If you were prescribed an antibiotic medicine, take it as told by your health care provider. Do not stop taking the antibiotic even if you start to feel better.  Do not drive or use heavy machinery while  taking prescription pain medicine. General instructions  Follow a full liquid diet or another diet as directed by your health care provider. After your symptoms improve, your health care provider may tell you to change your diet. He or she may recommend that you eat a diet that  contains at least 25 g (25 grams) of fiber daily. Fiber makes it easier to pass stool. Healthy sources of fiber include: ? Berries. One cup contains 4-8 grams of fiber. ? Beans or lentils. One half cup contains 5-8 grams of fiber. ? Green vegetables. One cup contains 4 grams of fiber.  Exercise for at least 30 minutes, 3 times each week. You should exercise hard enough to raise your heart rate and break a sweat.  Keep all follow-up visits as told by your health care provider. This is important. You may need a colonoscopy. Contact a health care provider if:  Your pain does not improve.  You have a hard time drinking or eating food.  Your bowel movements do not return to normal. Get help right away if:  Your pain gets worse.  Your symptoms do not get better with treatment.  Your symptoms suddenly get worse.  You have a fever.  You vomit more than one time.  You have stools that are bloody, black, or tarry. Summary  Diverticulitis is infection or inflammation of small pouches (diverticula) in the colon that form due to a condition called diverticulosis. Diverticula can trap stool (feces) and bacteria, causing infection and inflammation.  You are at higher risk for this condition if you have diverticulosis and you eat a diet that does not include enough fiber.  Most cases of this condition are mild and can be treated at home. More severe cases may need to be treated at a hospital.  When your condition is under control, your health care provider may recommend that you have an exam called a colonoscopy. This exam can show how severe your diverticula are and whether something else may be causing your symptoms. This information is not intended to replace advice given to you by your health care provider. Make sure you discuss any questions you have with your health care provider. Document Released: 08/07/2005 Document Revised: 11/30/2016 Document Reviewed: 11/30/2016 Elsevier  Interactive Patient Education  [employer].

## 2018-04-27 ENCOUNTER — Ambulatory Visit: Payer: Medicare Other | Admitting: Family Medicine

## 2018-04-27 ENCOUNTER — Encounter: Payer: Self-pay | Admitting: Family Medicine

## 2018-04-27 VITALS — BP 112/68 | HR 80 | Temp 98.2°F | Ht 63.0 in | Wt 166.5 lb

## 2018-04-27 DIAGNOSIS — K5792 Diverticulitis of intestine, part unspecified, without perforation or abscess without bleeding: Secondary | ICD-10-CM

## 2018-04-27 NOTE — Assessment & Plan Note (Signed)
tx with cipro and flagyl starting 6/12 and now almost resolved Doing very well  Will continue /finish abx Keep up fluids Gradually advance diet as tol (with eventual goal of regular fiber to prev constipation) but avoid nuts/seeds Update if not continuing to improve in a week or if worsening

## 2018-04-27 NOTE — Progress Notes (Signed)
Subjective:    Patient ID: Kathryn Weaver, female    DOB: 04-02-1946, 72 y.o.   MRN: [id number]  HPI Here for f/u of diverticulitis  Seen 6/12 by NP Clark Evaluation as follows  Abdominal Pain:   Located to LLQ x 2 weeks, no improvement after constipation resolved. Exam today with moderate tenderness to LLQ, doesn't appear sickly/ Given history of diverticulitis, evidence of diverticulosis on colonoscopy, exam findings will treat for presumed diverticulitis.  Rx for Cipro and Flagyl course sent to pharmacy. Discussed bland diet vs clear liquids.  She will follow up with PCP Monday next week. She is stable for outpatient treatment.   Pleas Koch, NP  Wt Readings from Last 3 Encounters:  04/27/18 166 lb 8 oz (75.5 kg)  04/22/18 165 lb 12 oz (75.2 kg)  04/01/18 171 lb 1.6 oz (77.6 kg)   abx started working- a lot better  No pain at all except a tiny fleeting pain  No fever  One time nauseated -not now No blood in stool Stool is loose   Watching diet closely  Patient Active Problem List   Diagnosis Date Noted  . Constipation 03/18/2018  . Change in skin mole 02/18/2018  . MGUS (monoclonal gammopathy of unknown significance) 09/17/2017  . Hyperglycemia 10/21/2016  . Somnolence, daytime 04/11/2016  . Fatigue 04/05/2016  . Internal hemorrhoid 04/20/2014  . Iron deficiency 03/16/2014  . Screening mammogram, encounter for 03/01/2014  . Uterine prolapse 03/01/2014  . Esophageal dysphagia 01/05/2013  . Stress reaction 09/01/2012  . Hyperlipidemia 04/28/2012  . Routine general medical examination at a health care facility 04/20/2012  . Obesity 02/05/2012  . Other neutropenia ([place]) 10/23/2010  . Thrombocytopenia ([place]) 10/24/2009  . POSTMENOPAUSAL STATUS 08/09/2008  . DEPRESSION 08/05/2007  . Essential hypertension 08/05/2007  . GERD 04/20/2007  . DIVERTICULAR DISEASE 04/20/2007  . History of colonic polyps 04/13/2007   Past Medical History:  Diagnosis Date  .  Adenomatous polyp of colon 03/2007  . Anemia    s/p GI bleed, hospital 5/08  . Depression   . Diverticulosis   . Endometriosis    uterine US 06/2003  . GERD (gastroesophageal reflux disease)   . Hiatal hernia    EGD 5/08- stricture, HH, gastritis, GERD  . Hypertension   . Hypertension   . Iron deficiency 03/16/2014   Past Surgical History:  Procedure Laterality Date  . cardiac CT  9/12   normal   . HAMMER TOE SURGERY Left 12/02/2017  . TUBAL LIGATION     Social History   Tobacco Use  . Smoking status: Never Smoker  . Smokeless tobacco: Never Used  Substance Use Topics  . Alcohol use: No    Alcohol/week: 0.0 oz  . Drug use: No   Family History  Problem Relation Age of Onset  . Cancer Father        unknown type  . Hypertension Mother   . Colon cancer Neg Hx   . Esophageal cancer Neg Hx   . Rectal cancer Neg Hx   . Stomach cancer Neg Hx    No Known Allergies Current Outpatient Medications on File Prior to Visit  Medication Sig Dispense Refill  . atorvastatin (LIPITOR) 10 MG tablet Take 1 tablet (10 mg total) by mouth daily. 90 tablet 3  . cholecalciferol (VITAMIN D) 1000 units tablet Take 5,000 Units by mouth daily.    . ciprofloxacin (CIPRO) 500 MG tablet Take 1 tablet (500 mg total) by mouth 2 (two) times  daily. 20 tablet 0  . Esomeprazole Magnesium (NEXIUM 24HR PO) Take 22.3 mg by mouth daily.    . hydrochlorothiazide (HYDRODIURIL) 25 MG tablet Take 1 tablet (25 mg total) by mouth daily. 90 tablet 3  . metroNIDAZOLE (FLAGYL) 500 MG tablet Take 1 tablet (500 mg total) by mouth 3 (three) times daily. 30 tablet 0  . Multiple Vitamins-Minerals (CENTRUM PO) Take 1 tablet by mouth 2 (two) times a week. Take one by mouth daily    . sertraline (ZOLOFT) 50 MG tablet TAKE 1 TABLETBY MOUTH EVERY DAY 90 tablet 3  . [DISCONTINUED] ranitidine (ZANTAC) 150 MG capsule Take 1 capsule (150 mg total) by mouth 2 (two) times daily. For 3 months 60 capsule 2   No current  facility-administered medications on file prior to visit.     Review of Systems  Constitutional: Negative for activity change, appetite change, fatigue, fever and unexpected weight change.  HENT: Negative for congestion, ear pain, rhinorrhea, sinus pressure and sore throat.   Eyes: Negative for pain, redness and visual disturbance.  Respiratory: Negative for cough, shortness of breath and wheezing.   Cardiovascular: Negative for chest pain and palpitations.  Gastrointestinal: Positive for diarrhea. Negative for abdominal distention, abdominal pain, anal bleeding, blood in stool, constipation, nausea, rectal pain and vomiting.  Endocrine: Negative for polydipsia and polyuria.  Genitourinary: Negative for dysuria, frequency and urgency.  Musculoskeletal: Negative for arthralgias, back pain and myalgias.  Skin: Negative for pallor and rash.  Allergic/Immunologic: Negative for environmental allergies.  Neurological: Negative for dizziness, syncope and headaches.  Hematological: Negative for adenopathy. Does not bruise/bleed easily.  Psychiatric/Behavioral: Negative for decreased concentration and dysphoric mood. The patient is not nervous/anxious.        Objective:   Physical Exam  Constitutional: She appears well-developed and well-nourished. No distress.  Well appearing  HENT:  Head: Normocephalic and atraumatic.  Mouth/Throat: Oropharynx is clear and moist.  Eyes: Pupils are equal, round, and reactive to light. Conjunctivae and EOM are normal. No scleral icterus.  Neck: Normal range of motion. Neck supple.  Cardiovascular: Normal rate, regular rhythm and normal heart sounds.  Pulmonary/Chest: Effort normal and breath sounds normal. No respiratory distress. She has no wheezes. She has no rales.  Abdominal: Soft. Bowel sounds are normal. She exhibits no distension, no abdominal bruit, no pulsatile midline mass and no mass. There is no hepatosplenomegaly. There is tenderness in the left  lower quadrant. There is no rigidity, no rebound, no guarding, no CVA tenderness, no tenderness at McBurney's point and negative Murphy's sign.  Very slt tenderness in LLQ -to deep palp only  No pain with movement/shaking table  No rebound or guarding Nl bs   Lymphadenopathy:    She has no cervical adenopathy.  Neurological: She is alert.  Skin: Skin is warm and dry. Capillary refill takes less than 2 seconds. No erythema. No pallor.  Psychiatric: She has a normal mood and affect.  pleasant          Assessment & Plan:   Problem List Items Addressed This Visit      Other   Diverticulitis - Primary    tx with cipro and flagyl starting 6/12 and now almost resolved Doing very well  Will continue /finish abx Keep up fluids Gradually advance diet as tol (with eventual goal of regular fiber to prev constipation) but avoid nuts/seeds Update if not continuing to improve in a week or if worsening

## 2018-04-27 NOTE — Patient Instructions (Signed)
Finish antibiotics  Drink fluids Gradually increase your fiber- but avoid nuts/seeds   If symptoms worsen at all please let us know

## 2018-04-28 NOTE — Progress Notes (Signed)
Cardiology Office Note   Date:  04/29/2018   ID:  Kathryn, Weaver Aug 17, 1946, MRN [id number]  PCP:  Abner Greenspan, MD  Cardiologist:   Jenkins Rouge, MD   No chief complaint on file.     History of Present Illness: Kathryn Weaver is a 72 y.o. female who presents for consultation regarding fatigue. Referred by Dr Glori Bickers. CRF;s inclucde DM-2 , HTN, HLD. Recent flair of diverticulitis. She does have a history of depression. No history of cardiovascular disease   She denies palpitations, dyspnea, PND , orthopnea or syncope She says she's tired from watching her son's 2 grand babies Over the last 7 years. ( ages 25,7 now) She drives , feeds them and cares for them all day. No LE edema or signs of congestive Failure   Past Medical History:  Diagnosis Date  . Adenomatous polyp of colon 03/2007  . Anemia    s/p GI bleed, hospital 5/08  . Depression   . Diverticulosis   . Endometriosis    uterine US 06/2003  . GERD (gastroesophageal reflux disease)   . Hiatal hernia    EGD 5/08- stricture, HH, gastritis, GERD  . Hypertension   . Hypertension   . Iron deficiency 03/16/2014    Past Surgical History:  Procedure Laterality Date  . cardiac CT  9/12   normal   . HAMMER TOE SURGERY Left 12/02/2017  . TUBAL LIGATION       Current Outpatient Medications  Medication Sig Dispense Refill  . atorvastatin (LIPITOR) 10 MG tablet Take 1 tablet (10 mg total) by mouth daily. 90 tablet 3  . cholecalciferol (VITAMIN D) 1000 units tablet Take 5,000 Units by mouth daily.    . ciprofloxacin (CIPRO) 500 MG tablet Take 1 tablet (500 mg total) by mouth 2 (two) times daily. 20 tablet 0  . Esomeprazole Magnesium (NEXIUM 24HR PO) Take 22.3 mg by mouth daily.    . hydrochlorothiazide (HYDRODIURIL) 25 MG tablet Take 1 tablet (25 mg total) by mouth daily. 90 tablet 3  . metroNIDAZOLE (FLAGYL) 500 MG tablet Take 1 tablet (500 mg total) by mouth 3 (three) times daily. 30 tablet 0  . Multiple  Vitamins-Minerals (CENTRUM PO) Take 1 tablet by mouth 2 (two) times a week. Take one by mouth daily    . sertraline (ZOLOFT) 50 MG tablet TAKE 1 TABLETBY MOUTH EVERY DAY 90 tablet 3   No current facility-administered medications for this visit.     Allergies:   Patient has no known allergies.    Social History:  The patient  reports that she has never smoked. She has never used smokeless tobacco. She reports that she does not drink alcohol or use drugs.   Family History:  The patient's family history includes Cancer in her father; Hypertension in her mother.    ROS:  Please see the history of present illness.   Otherwise, review of systems are positive for none.   All other systems are reviewed and negative.    PHYSICAL EXAM: VS:  BP 112/70   Pulse 74   Ht 5\' 3"  (1.6 m)   Wt 167 lb (75.8 kg)   SpO2 96%   BMI 29.58 kg/m  , BMI Body mass index is 29.58 kg/m. Affect appropriate Healthy:  appears stated age 62: normal Neck supple with no adenopathy JVP normal no bruits no thyromegaly Lungs clear with no wheezing and good diaphragmatic motion Heart:  S1/S2 no murmur, no rub, gallop or click  PMI normal Abdomen: benighn, BS positve, no tenderness, no AAA no bruit.  No HSM or HJR Distal pulses intact with no bruits No edema Neuro non-focal Skin warm and dry No muscular weakness    EKG:  SR poor R wave progression 2012 04/29/18 SR rate 74 bi atrial enlargement otherwsie normal    Recent Labs: 02/16/2018: TSH 2.82 03/25/2018: ALT 22; BUN 15; Creatinine, Ser 0.91; Hemoglobin 11.4; Platelets 166; Potassium 3.6; Sodium 140    Lipid Panel    Component Value Date/Time   CHOL 154 02/16/2018 0820   TRIG 45.0 02/16/2018 0820   HDL 65.30 02/16/2018 0820   CHOLHDL 2 02/16/2018 0820   VLDL 9.0 02/16/2018 0820   LDLCALC 80 02/16/2018 0820   LDLDIRECT 156.0 04/21/2012 0843      Wt Readings from Last 3 Encounters:  04/29/18 167 lb (75.8 kg)  04/27/18 166 lb 8 oz (75.5 kg)    04/22/18 165 lb 12 oz (75.2 kg)      Other studies Reviewed: Additional studies/ records that were reviewed today include: notes from Dr Glori Bickers labs .    ASSESSMENT AND PLAN:  1.  Fatigue: doubt cardiac will check TTE for RV/LV function make sure EF is normal  2. HTN:  Well controlled.  Continue current medications and low sodium Dash type diet.    3. HLD continue statin LDL at goal 4. DM:  Discussed low carb diet.  Target hemoglobin A1c is 6.5 or less.  Continue current medications. 5. Depression: on Zoloft f/u primary  6. Diverticulitis:  Improving abdominal pain no fever low fiber diet    Current medicines are reviewed at length with the patient today.  The patient does not have concerns regarding medicines.  The following changes have been made:  no change  Labs/ tests ordered today include: TTE  No orders of the defined types were placed in this encounter.    Disposition:   FU with cardiology PRN      Signed, Jenkins Rouge, MD  04/29/2018 9:04 AM    [facility] [place], [place], [place]  [address] Phone: [phone]; Fax: [phone]

## 2018-04-29 ENCOUNTER — Ambulatory Visit: Payer: Medicare Other | Admitting: Cardiovascular Disease

## 2018-04-29 ENCOUNTER — Encounter: Payer: Self-pay | Admitting: Cardiovascular Disease

## 2018-04-29 VITALS — BP 112/70 | HR 74 | Ht 63.0 in | Wt 167.0 lb

## 2018-04-29 DIAGNOSIS — R5383 Other fatigue: Secondary | ICD-10-CM | POA: Diagnosis not present

## 2018-04-29 NOTE — Patient Instructions (Signed)
Medication Instructions:  Your physician recommends that you continue on your current medications as directed. Please refer to the Current Medication list given to you today.   Labwork: none  Testing/Procedures: Your physician has requested that you have an echocardiogram. Echocardiography is a painless test that uses sound waves to create images of your heart. It provides your doctor with information about the size and shape of your heart and how well your heart's chambers and valves are working. This procedure takes approximately one hour. There are no restrictions for this procedure.    Follow-Up: Your physician recommends that you schedule a follow-up appointment in: as needed    Any Other Special Instructions Will Be Listed Below (If Applicable).     If you need a refill on your cardiac medications before your next appointment, please call your pharmacy.   

## 2018-05-26 ENCOUNTER — Ambulatory Visit: Payer: Medicare Other | Attending: Internal Medicine | Admitting: Neurology

## 2018-05-26 DIAGNOSIS — R5383 Other fatigue: Secondary | ICD-10-CM | POA: Diagnosis present

## 2018-05-26 DIAGNOSIS — G4733 Obstructive sleep apnea (adult) (pediatric): Secondary | ICD-10-CM | POA: Insufficient documentation

## 2018-06-01 ENCOUNTER — Ambulatory Visit (HOSPITAL_COMMUNITY): Payer: Medicare Other | Attending: Cardiovascular Disease

## 2018-06-09 NOTE — Procedures (Signed)
[place] A. Merlene Laughter, MD     [email or web address]             NOCTURNAL POLYSOMNOGRAPHY   LOCATION: [facility]   Patient Name: Kathryn Weaver, Kathryn Weaver Date: 05/26/2018 Gender: Female D.O.B: 22-Jul-1946 Age (years): 34 Referring Provider: Mathis Dad Higgs Height (inches): 63 Interpreting Physician: Phillips Odor MD, ABSM Weight (lbs): 171 RPSGT: Rosebud Poles BMI: 30 MRN: [id number] Neck Size: 15.50 <br> <br> CLINICAL INFORMATION Sleep Study Type: NPSG    Indication for sleep study: N/A    Epworth Sleepiness Score: 3    SLEEP STUDY TECHNIQUE As per the AASM Manual for the Scoring of Sleep and Associated Events v2.3 (April 2016) with a hypopnea requiring 4% desaturations.  The channels recorded and monitored were frontal, central and occipital EEG, electrooculogram (EOG), submentalis EMG (chin), nasal and oral airflow, thoracic and abdominal wall motion, anterior tibialis EMG, snore microphone, electrocardiogram, and pulse oximetry.  MEDICATIONS Medications self-administered by patient taken the night of the study : N/A  Current Outpatient Medications:  .  atorvastatin (LIPITOR) 10 MG tablet, Take 1 tablet (10 mg total) by mouth daily., Disp: 90 tablet, Rfl: 3 .  cholecalciferol (VITAMIN D) 1000 units tablet, Take 5,000 Units by mouth daily., Disp: , Rfl:  .  ciprofloxacin (CIPRO) 500 MG tablet, Take 1 tablet (500 mg total) by mouth 2 (two) times daily., Disp: 20 tablet, Rfl: 0 .  Esomeprazole Magnesium (NEXIUM 24HR PO), Take 22.3 mg by mouth daily., Disp: , Rfl:  .  hydrochlorothiazide (HYDRODIURIL) 25 MG tablet, Take 1 tablet (25 mg total) by mouth daily., Disp: 90 tablet, Rfl: 3 .  metroNIDAZOLE (FLAGYL) 500 MG tablet, Take 1 tablet (500 mg total) by mouth 3 (three) times daily., Disp: 30 tablet, Rfl: 0 .  Multiple Vitamins-Minerals (CENTRUM PO), Take 1 tablet by mouth 2 (two) times a week. Take one by mouth daily, Disp: , Rfl:  .  sertraline  (ZOLOFT) 50 MG tablet, TAKE 1 TABLETBY MOUTH EVERY DAY, Disp: 90 tablet, Rfl: 3    SLEEP ARCHITECTURE The study was initiated at 10:07:49 PM and ended at 4:55:53 AM.  Sleep onset time was 10.3 minutes and the sleep efficiency was 88.6%%. The total sleep time was 361.5 minutes.  Stage REM latency was 64.0 minutes.  The patient spent 2.8%% of the night in stage N1 sleep, 48.7%% in stage N2 sleep, 28.9%% in stage N3 and 19.6% in REM.  Alpha intrusion was absent.  Supine sleep was 45.78%.  RESPIRATORY PARAMETERS The overall apnea/hypopnea index (AHI) was 8.0 per hour. There were 3 total apneas, including 2 obstructive, 0 central and 1 mixed apneas. There were 45 hypopneas and 0 RERAs.  The AHI during Stage REM sleep was 16.1 per hour.  AHI while supine was 15.2 per hour.  The mean oxygen saturation was 92.5%. The minimum SpO2 during sleep was 85.0%.  soft snoring was noted during this study.  CARDIAC DATA The 2 lead EKG demonstrated sinus rhythm. The mean heart rate was N/A beats per minute. Other EKG findings include: None. LEG MOVEMENT DATA The total PLMS were 0 with a resulting PLMS index of 0.0. Associated arousal with leg movement index was 3.7.  IMPRESSIONS - Mild obstructive sleep apnea is documented with this study. The severity does not require positive pressure treatment.  Delano Metz, MD Diplomate, American Board of Sleep Medicine.  ELECTRONICALLY SIGNED ON:  06/09/2018, 8:46 AM [facility] PH: [phone]   FX: [phone] ACCREDITED BY  THE AMERICAN ACADEMY OF SLEEP MEDICINE

## 2018-06-23 ENCOUNTER — Encounter: Payer: Self-pay | Admitting: Family Medicine

## 2018-06-23 ENCOUNTER — Encounter: Payer: Self-pay | Admitting: Gastroenterology

## 2018-06-23 ENCOUNTER — Ambulatory Visit: Payer: Medicare Other | Admitting: Family Medicine

## 2018-06-23 VITALS — BP 126/74 | HR 65 | Temp 98.1°F | Ht 63.0 in | Wt 164.5 lb

## 2018-06-23 DIAGNOSIS — Z86010 Personal history of colonic polyps: Secondary | ICD-10-CM

## 2018-06-23 DIAGNOSIS — R197 Diarrhea, unspecified: Secondary | ICD-10-CM

## 2018-06-23 LAB — BASIC METABOLIC PANEL
BUN: 12 mg/dL (ref 6–23)
CALCIUM: 11.1 mg/dL — AB (ref 8.4–10.5)
CHLORIDE: 101 meq/L (ref 96–112)
CO2: 32 mEq/L (ref 19–32)
CREATININE: 0.84 mg/dL (ref 0.40–1.20)
GFR: 85.93 mL/min (ref >60.00)
Glucose, Bld: 102 mg/dL — ABNORMAL HIGH (ref 70–99)
Potassium: 4.3 mEq/L (ref 3.5–5.1)
Sodium: 139 mEq/L (ref 135–145)

## 2018-06-23 LAB — CBC WITH DIFFERENTIAL/PLATELET
BASOS ABS: 0 10*3/uL (ref 0.0–0.1)
Basophils Relative: 0.2 % (ref 0.0–3.0)
EOS ABS: 0 10*3/uL (ref 0.0–0.7)
Eosinophils Relative: 0.7 % (ref 0.0–5.0)
HEMATOCRIT: 33.8 % — AB (ref 36.0–46.0)
HEMOGLOBIN: 11.3 g/dL — AB (ref 12.0–15.0)
LYMPHS PCT: 25.1 % (ref 12.0–46.0)
Lymphs Abs: 1.5 10*3/uL (ref 0.7–4.0)
MCHC: 33.3 g/dL (ref 30.0–36.0)
MCV: 95.2 fl (ref 78.0–100.0)
MONO ABS: 0.7 10*3/uL (ref 0.1–1.0)
Monocytes Relative: 10.8 % (ref 3.0–12.0)
Neutro Abs: 3.9 10*3/uL (ref 1.4–7.7)
Neutrophils Relative %: 63.2 % (ref 43.0–77.0)
Platelets: 152 10*3/uL (ref 150.0–400.0)
RBC: 3.55 Mil/uL — AB (ref 3.87–5.11)
RDW: 14.3 % (ref 11.5–15.5)
WBC: 6.2 10*3/uL (ref 4.0–10.5)

## 2018-06-23 NOTE — Assessment & Plan Note (Signed)
Overdue for 5 y recall colonoscopy Ordered today

## 2018-06-23 NOTE — Assessment & Plan Note (Signed)
With cramping since early July  Reassuring exam Overdue for recall colonoscopy for polyp  Lab today-cbc/bmet Stool culture  c diff   Adv trial of probiotic for poss bacterial overgrowth  Ref to GI for colonoscopy as well

## 2018-06-23 NOTE — Progress Notes (Signed)
Subjective:    Patient ID: Kathryn Weaver, female    DOB: 25-Feb-1946, 72 y.o.   MRN: [id number]  HPI  Here with c/o abdominal pain and diarrhea for 1 month   Last seen for diverticulitis in June tx with cipro and flagyl with resolution of LLQ abd pain   Wt Readings from Last 3 Encounters:  06/23/18 164 lb 8 oz (74.6 kg)  04/29/18 167 lb (75.8 kg)  04/27/18 166 lb 8 oz (75.5 kg)   29.14 kg/m   Abdominal cramping started Kathryn July (low abdomen)  Also urgency to have BM - average 5 times per day  Came out of the blue  Lots of foul smelling gas -only flatus/no burping and no indigestion No blood in stool  Loose -not watery  Cramps stop after she has BM Does not remember eating anything unusual  No one else has the symptoms  No fever  No n/v  Never had this before /no hx of IBS   Never tried fiber  Has not tried probiotic   A lot of stress -not helping    This does not feel like her diverticulitis at all No LLQ pain   Aleve- helps her cramping    Last colonoscopy 8/13 with adenoma  She is ready to schedule that now   Lab Results  Component Value Date   WBC 5.4 03/25/2018   HGB 11.4 (L) 03/25/2018   HCT 36.0 03/25/2018   MCV 98.4 03/25/2018   PLT 166 03/25/2018     Patient Active Problem List   Diagnosis Date Noted  . Diarrhea 06/23/2018  . Diverticulitis 04/27/2018  . Constipation 03/18/2018  . Change in skin mole 02/18/2018  . MGUS (monoclonal gammopathy of unknown significance) 09/17/2017  . Hyperglycemia 10/21/2016  . Somnolence, daytime 04/11/2016  . Fatigue 04/05/2016  . Internal hemorrhoid 04/20/2014  . Iron deficiency 03/16/2014  . Screening mammogram, encounter for 03/01/2014  . Uterine prolapse 03/01/2014  . Esophageal dysphagia 01/05/2013  . Stress reaction 09/01/2012  . Hyperlipidemia 04/28/2012  . Routine general medical examination at a health care facility 04/20/2012  . Obesity 02/05/2012  . Other neutropenia ([place]) 10/23/2010  .  Thrombocytopenia ([place]) 10/24/2009  . POSTMENOPAUSAL STATUS 08/09/2008  . DEPRESSION 08/05/2007  . Essential hypertension 08/05/2007  . GERD 04/20/2007  . DIVERTICULAR DISEASE 04/20/2007  . History of colonic polyps 04/13/2007   Past Medical History:  Diagnosis Date  . Adenomatous polyp of colon 03/2007  . Anemia    s/p GI bleed, hospital 5/08  . Depression   . Diverticulosis   . Endometriosis    uterine US 06/2003  . GERD (gastroesophageal reflux disease)   . Hiatal hernia    EGD 5/08- stricture, HH, gastritis, GERD  . Hypertension   . Hypertension   . Iron deficiency 03/16/2014   Past Surgical History:  Procedure Laterality Date  . cardiac CT  9/12   normal   . HAMMER TOE SURGERY Left 12/02/2017  . TUBAL LIGATION     Social History   Tobacco Use  . Smoking status: Never Smoker  . Smokeless tobacco: Never Used  Substance Use Topics  . Alcohol use: No    Alcohol/week: 0.0 standard drinks  . Drug use: No   Family History  Problem Relation Age of Onset  . Cancer Father        unknown type  . Hypertension Mother   . Colon cancer Neg Hx   . Esophageal cancer Neg Hx   .  Rectal cancer Neg Hx   . Stomach cancer Neg Hx    No Known Allergies Current Outpatient Medications on File Prior to Visit  Medication Sig Dispense Refill  . atorvastatin (LIPITOR) 10 MG tablet Take 1 tablet (10 mg total) by mouth daily. 90 tablet 3  . cholecalciferol (VITAMIN D) 1000 units tablet Take 5,000 Units by mouth daily.    . Esomeprazole Magnesium (NEXIUM 24HR PO) Take 22.3 mg by mouth daily.    . hydrochlorothiazide (HYDRODIURIL) 25 MG tablet Take 1 tablet (25 mg total) by mouth daily. 90 tablet 3  . Multiple Vitamins-Minerals (CENTRUM PO) Take 1 tablet by mouth 2 (two) times a week. Take one by mouth daily    . sertraline (ZOLOFT) 50 MG tablet TAKE 1 TABLETBY MOUTH EVERY DAY 90 tablet 3  . [DISCONTINUED] ranitidine (ZANTAC) 150 MG capsule Take 1 capsule (150 mg total) by mouth 2 (two)  times daily. For 3 months 60 capsule 2   No current facility-administered medications on file prior to visit.     Review of Systems  Constitutional: Negative for activity change, appetite change, fatigue, fever and unexpected weight change.  HENT: Negative for congestion, ear pain, rhinorrhea, sinus pressure and sore throat.   Eyes: Negative for pain, redness and visual disturbance.  Respiratory: Negative for cough, shortness of breath and wheezing.   Cardiovascular: Negative for chest pain and palpitations.  Gastrointestinal: Positive for abdominal pain and diarrhea. Negative for abdominal distention, anal bleeding, blood in stool, constipation, nausea, rectal pain and vomiting.  Endocrine: Negative for polydipsia and polyuria.  Genitourinary: Negative for dysuria, frequency and urgency.  Musculoskeletal: Negative for arthralgias, back pain and myalgias.  Skin: Negative for pallor and rash.  Allergic/Immunologic: Negative for environmental allergies.  Neurological: Negative for dizziness, syncope and headaches.  Hematological: Negative for adenopathy. Does not bruise/bleed easily.  Psychiatric/Behavioral: Negative for decreased concentration and dysphoric mood. The patient is not nervous/anxious.        Stressors        Objective:   Physical Exam  Constitutional: She appears well-developed and well-nourished. No distress.  overwt and well appearing  HENT:  Head: Normocephalic and atraumatic.  Mouth/Throat: Oropharynx is clear and moist.  Eyes: Pupils are equal, round, and reactive to light. Conjunctivae and EOM are normal. No scleral icterus.  Neck: Normal range of motion. Neck supple.  Cardiovascular: Normal rate, regular rhythm and normal heart sounds.  Pulmonary/Chest: Effort normal and breath sounds normal. No respiratory distress. She has no wheezes. She has no rales.  Abdominal: Soft. Bowel sounds are normal. She exhibits no distension and no mass. There is no tenderness.  There is no rebound and no guarding. No hernia.  No tenderness in any area  No rebound or guarding  Lymphadenopathy:    She has no cervical adenopathy.  Neurological: She is alert. She displays normal reflexes. Coordination normal.  Skin: Skin is warm and dry. Capillary refill takes less than 2 seconds. No rash noted. No erythema. No pallor.  No jaundice  Psychiatric: She has a normal mood and affect.          Assessment & Plan:   Problem List Items Addressed This Visit      Other   Diarrhea - Primary    With cramping since Kathryn July  Reassuring exam Overdue for recall colonoscopy for polyp  Lab today-cbc/bmet Stool culture  c diff   Adv trial of probiotic for poss bacterial overgrowth  Ref to GI for colonoscopy as well  Relevant Orders   Basic metabolic panel   CBC with Differential/Platelet   Stool culture   C. difficile GDH and Toxin A/B   History of colonic polyps    Overdue for 5 y recall colonoscopy Ordered today      Relevant Orders   Ambulatory referral to Gastroenterology

## 2018-06-23 NOTE — Patient Instructions (Signed)
We will check some labs and stool tests for diarrhea   Try a probiotic like Align over the counter - as directed (this may help)   We will refer for your screening colonoscopy also

## 2018-06-25 LAB — C. DIFFICILE GDH AND TOXIN A/B
GDH ANTIGEN: DETECTED
MICRO NUMBER:: 90965758
SPECIMEN QUALITY:: ADEQUATE
TOXIN A AND B: NOT DETECTED

## 2018-06-25 LAB — CLOSTRIDIUM DIFFICILE TOXIN B, QUALITATIVE, REAL-TIME PCR: Toxigenic C. Difficile by PCR: DETECTED — AB

## 2018-06-26 MED ORDER — METRONIDAZOLE 500 MG PO TABS: 500.0000 mg | ORAL_TABLET | Freq: Three times a day (TID) | ORAL | 0 refills | Status: DC

## 2018-06-26 NOTE — Addendum Note (Signed)
Addended by: Modena Nunnery on: 06/26/2018 08:28 AM   Modules accepted: Orders

## 2018-06-27 ENCOUNTER — Telehealth: Payer: Self-pay

## 2018-06-27 NOTE — Telephone Encounter (Signed)
Received a call to the Saturday clinic with critical results. Results provided was for positive c. Difficile. Patient is already on flagyl 500 mg for this since yesterday, according to results note. Results given to Dr. Diona Browner at Saturday clinic. RS CMA.

## 2018-06-28 LAB — STOOL CULTURE
MICRO NUMBER: 90965784
MICRO NUMBER:: 90965785
MICRO NUMBER:: 90965786
SHIGA RESULT:: NOT DETECTED
SPECIMEN QUALITY: ADEQUATE
SPECIMEN QUALITY: ADEQUATE
SPECIMEN QUALITY: ADEQUATE

## 2018-06-29 NOTE — Telephone Encounter (Signed)
Called pt to see how she's doing and to make sure she started flagyl for c-diff but no answer so left VM requesting pt to call the office back, CRM created

## 2018-06-29 NOTE — Telephone Encounter (Signed)
PLEASE NOTE: All timestamps contained within this report are represented as [place] Standard Time. CONFIDENTIALTY NOTICE: This fax transmission is intended only for the addressee. It contains information that is legally privileged, confidential or otherwise protected from use or disclosure. If you are not the intended recipient, you are strictly prohibited from reviewing, disclosing, copying using or disseminating any of this information or taking any action in reliance on or regarding this information. If you have received this fax in error, please notify us immediately by telephone so that we can arrange for its return to [place]. Phone: [phone], Toll-Free: [phone], Fax: [phone] Page: 1 of 2 Call Id: [id number] [place] Patient Name: Kathryn Weaver Gender: Female DOB: 1946/09/21 Age: 72 Y 1 M 7 D Return Phone Number: [phone] (Primary) Address: City/State/Zip: [place] Client [place] Night - Client Client Site [place] Physician Loura Pardon - MD Contact Type Call Who Is Calling Lab Lab Name [facility] Phone Number [phone] Lab Tech Name [place] [facility] Reference Number [id number] Chief Complaint Lab Result (Critical or Stat) Call Type Lab Send to RN Reason for Call Report lab results Initial Comment [place] w/ [facility] w/ Critical labs Additional Comment Translation No Nurse Assessment Nurse: Joya Gaskins, RN, Vonna Kotyk Date/Time Eilene Ghazi Time): 06/27/2018 9:28:13 AM Is there an on-call provider listed? ---Yes Please list name of person reporting value (Lab Employee) and a contact number. ---Lorriane Shire [phone] Please document the following items: Lab name Lab value (read back to lab to verify) Reference range for lab value Date and time blood was drawn Collect time of birth for bilirubin results ---C-diff toxin B Qualitative  Realtime PCR : Detective (positive result) Collected 06/24/18 @0934  Ordered by Dr. Glori Bickers Please collect the patient contact information from the lab. (name, phone number and address) ---[phone] List any special notes provided by lab. ---none Guidelines Guideline Title Affirmed Question Affirmed Notes Nurse Date/Time (Eastern Time) Disp. Time Eilene Ghazi Time) Disposition Final User 06/27/2018 9:46:27 AM Called On-Call Provider Joya Gaskins, RN, Vonna Kotyk 06/27/2018 9:47:05 AM Lab Call Joya Gaskins, RN, Vonna Kotyk Reason: result reported to Rod Holler at [place] office. 06/27/2018 9:47:10 AM Clinical Call Yes Joya Gaskins, RN, Vonna Kotyk PLEASE NOTE: All timestamps contained within this report are represented as [place] Standard Time. CONFIDENTIALTY NOTICE: This fax transmission is intended only for the addressee. It contains information that is legally privileged, confidential or otherwise protected from use or disclosure. If you are not the intended recipient, you are strictly prohibited from reviewing, disclosing, copying using or disseminating any of this information or taking any action in reliance on or regarding this information. If you have received this fax in error, please notify us immediately by telephone so that we can arrange for its return to [place]. Phone: [phone], Toll-Free: [phone], Fax: [phone] Page: 2 of 2 Call Id: [id number] Comments User: Ellan Lambert, RN Date/Time Eilene Ghazi Time): 06/27/2018 9:39:32 AM RN spoke with Charge RN, who advised that RN contact [place] office with results. Paging DoctorName Phone DateTime Result/Outcome Message Type Notes [place] [phone] 06/27/2018 9:46:27 AM Called On Call Provider - Reached Doctor Paged [place] Office 06/27/2018 9:46:44 AM Spoke with On Call - General Message Result Result given to Rod Holler at [place] office.

## 2018-06-29 NOTE — Telephone Encounter (Signed)
Please make sure she did start the flagyl for c diff Thanks

## 2018-06-30 NOTE — Telephone Encounter (Signed)
Talked to patient and she reports she is taking the antibiotic as prescribed and she is feeling "much better".

## 2018-07-28 ENCOUNTER — Other Ambulatory Visit (HOSPITAL_COMMUNITY): Payer: Medicare Other

## 2018-07-29 ENCOUNTER — Encounter: Payer: Self-pay | Admitting: *Deleted

## 2018-07-29 NOTE — Progress Notes (Signed)
After reviewing pt's chart for upcoming pre-visit for colonoscopy, I see that the pt did not have her echo that was ordered by Dr.Nishan for c/o fatigue. I then ask Josh Monday to review her chart and see if the pt should have this done before colonoscopy. Josh Monday, CRNA after we reviewed the information in her chart states that as long as she is not having any cardiac symptoms or problems then she does not have to do the echo before colon. Access the pt in [facility] and if any concerns she should do the echo before colon. Note left on pt's chart.

## 2018-08-04 ENCOUNTER — Encounter: Payer: Self-pay | Admitting: Gastroenterology

## 2018-08-04 ENCOUNTER — Ambulatory Visit (AMBULATORY_SURGERY_CENTER): Payer: Self-pay

## 2018-08-04 VITALS — Ht 63.0 in | Wt 165.2 lb

## 2018-08-04 DIAGNOSIS — Z86010 Personal history of colonic polyps: Secondary | ICD-10-CM

## 2018-08-04 MED ORDER — NA SULFATE-K SULFATE-MG SULF 17.5-3.13-1.6 GM/177ML PO SOLN: 1.0000 | Freq: Once | ORAL | 0 refills | Status: AC

## 2018-08-04 NOTE — Progress Notes (Signed)
Denies allergies to eggs or soy products. Denies complication of anesthesia or sedation. Denies use of weight loss medication. Denies use of O2.   Emmi instructions declined.  

## 2018-08-05 ENCOUNTER — Ambulatory Visit (HOSPITAL_COMMUNITY): Payer: Medicare Other | Admitting: Internal Medicine

## 2018-08-18 ENCOUNTER — Encounter: Payer: Self-pay | Admitting: Gastroenterology

## 2018-08-18 ENCOUNTER — Ambulatory Visit (AMBULATORY_SURGERY_CENTER): Payer: Medicare Other | Admitting: Gastroenterology

## 2018-08-18 VITALS — BP 113/47 | HR 59 | Temp 97.8°F | Resp 12 | Ht 63.0 in | Wt 165.0 lb

## 2018-08-18 DIAGNOSIS — K5140 Inflammatory polyps of colon without complications: Secondary | ICD-10-CM

## 2018-08-18 DIAGNOSIS — D122 Benign neoplasm of ascending colon: Secondary | ICD-10-CM | POA: Diagnosis not present

## 2018-08-18 DIAGNOSIS — D124 Benign neoplasm of descending colon: Secondary | ICD-10-CM

## 2018-08-18 DIAGNOSIS — Z86010 Personal history of colonic polyps: Secondary | ICD-10-CM | POA: Diagnosis present

## 2018-08-18 MED ORDER — CIPROFLOXACIN HCL 500 MG PO TABS: 500.0000 mg | ORAL_TABLET | Freq: Two times a day (BID) | ORAL | 0 refills | Status: DC

## 2018-08-18 MED ORDER — METRONIDAZOLE 500 MG PO TABS: 500.0000 mg | ORAL_TABLET | Freq: Two times a day (BID) | ORAL | 0 refills | Status: DC

## 2018-08-18 MED ORDER — SODIUM CHLORIDE 0.9 % IV SOLN: 500.0000 mL | Freq: Once | INTRAVENOUS | Status: DC

## 2018-08-18 NOTE — Patient Instructions (Signed)
YOU HAD AN ENDOSCOPIC PROCEDURE TODAY AT [facility]:   Refer to the procedure report that was given to you for any specific questions about what was found during the examination.  If the procedure report does not answer your questions, please call your gastroenterologist to clarify.  If you requested that your care partner not be given the details of your procedure findings, then the procedure report has been included in a sealed envelope for you to review at your convenience later.  YOU SHOULD EXPECT: Some feelings of bloating in the abdomen. Passage of more gas than usual.  Walking can help get rid of the air that was put into your GI tract during the procedure and reduce the bloating. If you had a lower endoscopy (such as a colonoscopy or flexible sigmoidoscopy) you may notice spotting of blood in your stool or on the toilet paper. If you underwent a bowel prep for your procedure, you may not have a normal bowel movement for a few days.  Please Note:  You might notice some irritation and congestion in your nose or some drainage.  This is from the oxygen used during your procedure.  There is no need for concern and it should clear up in a day or so.  SYMPTOMS TO REPORT IMMEDIATELY:   Following lower endoscopy (colonoscopy or flexible sigmoidoscopy):  Excessive amounts of blood in the stool  Significant tenderness or worsening of abdominal pains  Swelling of the abdomen that is new, acute  Fever of 100F or higher  Please see handouts given to you on Polyps and Diverticulosis and Diverticulitis. Low Fiber diet for 2 weeks then High Fiber diet long term.  You were started on Flagyl and Cipro for your infection twice a day for 10 days.  For urgent or emergent issues, a gastroenterologist can be reached at any hour by calling [phone].   DIET:  We do recommend a small meal at first, but then you may proceed to your regular diet.  Drink plenty of fluids but you should  avoid alcoholic beverages for 24 hours.  ACTIVITY:  You should plan to take it easy for the rest of today and you should NOT DRIVE or use heavy machinery until tomorrow (because of the sedation medicines used during the test).    FOLLOW UP: Our staff will call the number listed on your records the next business day following your procedure to check on you and address any questions or concerns that you may have regarding the information given to you following your procedure. If we do not reach you, we will leave a message.  However, if you are feeling well and you are not experiencing any problems, there is no need to return our call.  We will assume that you have returned to your regular daily activities without incident.  If any biopsies were taken you will be contacted by phone or by letter within the next 1-3 weeks.  Please call us at [phone] if you have not heard about the biopsies in 3 weeks.    SIGNATURES/CONFIDENTIALITY: You and/or your care partner have signed paperwork which will be entered into your electronic medical record.  These signatures attest to the fact that that the information above on your After Visit Summary has been reviewed and is understood.  Full responsibility of the confidentiality of this discharge information lies with you and/or your care-partner.  Thank you for letting us take care of your healthcare needs today.

## 2018-08-18 NOTE — Progress Notes (Signed)
Pt's states no medical or surgical changes since previsit or office visit. 

## 2018-08-18 NOTE — Progress Notes (Signed)
Called to room to assist during endoscopic procedure.  Patient ID and intended procedure confirmed with present staff. Received instructions for my participation in the procedure from the performing physician.  

## 2018-08-18 NOTE — Progress Notes (Signed)
Report to PACU, RN, vss, BBS= Clear.  

## 2018-08-18 NOTE — Op Note (Signed)
[facility] Patient Name: Kathryn Weaver Procedure Date: 08/18/2018 10:57 AM MRN: [id number] Endoscopist: Ladene Artist , MD Age: 72 Referring MD:  Date of Birth: [birth year] Gender: Female Account #: [id number] Procedure:                Colonoscopy Indications:              Surveillance: Personal history of adenomatous                            polyps on last colonoscopy > 5 years ago Medicines:                Monitored Anesthesia Care Procedure:                Pre-Anesthesia Assessment:                           - Prior to the procedure, a History and Physical                            was performed, and patient medications and                            allergies were reviewed. The patient's tolerance of                            previous anesthesia was also reviewed. The risks                            and benefits of the procedure and the sedation                            options and risks were discussed with the patient.                            All questions were answered, and informed consent                            was obtained. Prior Anticoagulants: The patient has                            taken no previous anticoagulant or antiplatelet                            agents. ASA Grade Assessment: II - A patient with                            mild systemic disease. After reviewing the risks                            and benefits, the patient was deemed in                            satisfactory condition to undergo the procedure.  After obtaining informed consent, the colonoscope                            was passed under direct vision. Throughout the                            procedure, the patient's blood pressure, pulse, and                            oxygen saturations were monitored continuously. The                            Model PCF-[id number]) scope was introduced                            through the anus and  advanced to the the cecum,                            identified by appendiceal orifice and ileocecal                            valve. The ileocecal valve, appendiceal orifice,                            and rectum were photographed. The quality of the                            bowel preparation was adequate. The colonoscopy was                            performed without difficulty. The patient tolerated                            the procedure well. Scope In: 11:02:50 AM Scope Out: 11:18:15 AM Scope Withdrawal Time: 0 hours 11 minutes 48 seconds  Total Procedure Duration: 0 hours 15 minutes 25 seconds  Findings:                 The perianal and digital rectal examinations were                            normal.                           Two sessile polyps were found in the descending                            colon and ascending colon. The polyps were 6 to 7                            mm in size. These polyps were removed with a cold                            snare. Resection and retrieval were complete.  Multiple medium-mouthed diverticula were found in                            the descending colon. There was narrowing of the                            colon in association with the diverticular opening.                            There was evidence of diverticular spasm. Purulent                            discharge was seen in association with the                            diverticular opening, consistent with                            diverticulitis. There was no evidence of                            diverticular bleeding.                           Multiple medium-mouthed diverticula were found in                            the sigmoid colon, transverse colon and ascending                            colon. There was evidence of diverticular spasm.                            There was evidence of an impacted diverticulum.                             There was no evidence of diverticular bleeding.                           Internal hemorrhoids were found during                            retroflexion. The hemorrhoids were small and Grade                            I (internal hemorrhoids that do not prolapse).                           The exam was otherwise without abnormality on                            direct and retroflexion views. Complications:            No immediate complications. Estimated blood loss:  None. Estimated Blood Loss:     Estimated blood loss: none. Impression:               - Two 6 to 7 mm polyps in the descending colon and                            in the ascending colon, removed with a cold snare.                            Resected and retrieved.                           - Moderate diverticulosis in the descending colon                            with diverticulitis.                           - Moderate diverticulosis in the sigmoid colon, in                            the transverse colon and in the ascending colon.                           - The examination was otherwise normal on direct                            and retroflexion views. Recommendation:           - Repeat colonoscopy in 5 years for surveillance.                           - Patient has a contact number available for                            emergencies. The signs and symptoms of potential                            delayed complications were discussed with the                            patient. Return to normal activities tomorrow.                            Written discharge instructions were provided to the                            patient.                           - Low fiber diet for 2 weeks then high fiber diet                            long term.                           -  Continue present medications.                           - Await pathology results.                           - Cipro  (ciprofloxacin) 500 mg PO BID for 10 days.                           - Flagyl (metronidazole) 500 mg PO BID for 10 days. Ladene Artist, MD 08/18/2018 11:26:11 AM This report has been signed electronically.

## 2018-08-19 ENCOUNTER — Telehealth: Payer: Self-pay

## 2018-08-19 NOTE — Telephone Encounter (Signed)
  Follow up Call-  Call back number 08/18/2018  Post procedure Call Back phone  # [phone]  Permission to leave phone message Yes  Some recent data might be hidden     Patient questions:  Do you have a fever, pain , or abdominal swelling? No. Pain Score  0 *  Have you tolerated food without any problems? Yes.    Have you been able to return to your normal activities? Yes.    Do you have any questions about your discharge instructions: Diet   No. Medications  No. Follow up visit  No.  Do you have questions or concerns about your Care? No.  Actions: * If pain score is 4 or above: No action needed, pain <4.

## 2018-08-31 ENCOUNTER — Inpatient Hospital Stay (HOSPITAL_COMMUNITY): Payer: Medicare Other | Attending: Hematology

## 2018-08-31 DIAGNOSIS — D72819 Decreased white blood cell count, unspecified: Secondary | ICD-10-CM | POA: Diagnosis not present

## 2018-08-31 DIAGNOSIS — Z79899 Other long term (current) drug therapy: Secondary | ICD-10-CM | POA: Insufficient documentation

## 2018-08-31 DIAGNOSIS — I10 Essential (primary) hypertension: Secondary | ICD-10-CM | POA: Insufficient documentation

## 2018-08-31 DIAGNOSIS — D472 Monoclonal gammopathy: Secondary | ICD-10-CM

## 2018-08-31 DIAGNOSIS — R5383 Other fatigue: Secondary | ICD-10-CM

## 2018-08-31 DIAGNOSIS — D696 Thrombocytopenia, unspecified: Secondary | ICD-10-CM | POA: Insufficient documentation

## 2018-08-31 LAB — CBC WITH DIFFERENTIAL/PLATELET
ABS IMMATURE GRANULOCYTES: 0 10*3/uL (ref 0.00–0.07)
Basophils Absolute: 0 10*3/uL (ref 0.0–0.1)
Basophils Relative: 0 %
EOS ABS: 0.1 10*3/uL (ref 0.0–0.5)
Eosinophils Relative: 2 %
HEMATOCRIT: 37.3 % (ref 36.0–46.0)
Hemoglobin: 11.4 g/dL — ABNORMAL LOW (ref 12.0–15.0)
IMMATURE GRANULOCYTES: 0 %
LYMPHS ABS: 1.7 10*3/uL (ref 0.7–4.0)
Lymphocytes Relative: 47 %
MCH: 30.7 pg (ref 26.0–34.0)
MCHC: 30.6 g/dL (ref 30.0–36.0)
MCV: 100.5 fL — AB (ref 80.0–100.0)
MONO ABS: 0.4 10*3/uL (ref 0.1–1.0)
MONOS PCT: 11 %
NEUTROS ABS: 1.4 10*3/uL — AB (ref 1.7–7.7)
NEUTROS PCT: 40 %
Platelets: 126 10*3/uL — ABNORMAL LOW (ref 150–400)
RBC: 3.71 MIL/uL — ABNORMAL LOW (ref 3.87–5.11)
RDW: 13.2 % (ref 11.5–15.5)
WBC: 3.6 10*3/uL — ABNORMAL LOW (ref 4.0–10.5)
nRBC: 0 % (ref 0.0–0.2)

## 2018-08-31 LAB — COMPREHENSIVE METABOLIC PANEL
ALT: 20 U/L (ref 0–44)
AST: 24 U/L (ref 15–41)
Albumin: 3.2 g/dL — ABNORMAL LOW (ref 3.5–5.0)
Alkaline Phosphatase: 72 U/L (ref 38–126)
Anion gap: 11 (ref 5–15)
BILIRUBIN TOTAL: 0.8 mg/dL (ref 0.3–1.2)
BUN: 11 mg/dL (ref 8–23)
CALCIUM: 10.1 mg/dL (ref 8.9–10.3)
CO2: 26 mmol/L (ref 22–32)
CREATININE: 0.74 mg/dL (ref 0.44–1.00)
Chloride: 102 mmol/L (ref 98–111)
GFR calc Af Amer: >60 mL/min (ref >60)
Glucose, Bld: 100 mg/dL — ABNORMAL HIGH (ref 70–99)
Potassium: 3.7 mmol/L (ref 3.5–5.1)
Sodium: 139 mmol/L (ref 135–145)
TOTAL PROTEIN: 8 g/dL (ref 6.5–8.1)

## 2018-08-31 LAB — FERRITIN: Ferritin: 50 ng/mL (ref 11–307)

## 2018-08-31 LAB — LACTATE DEHYDROGENASE: LDH: 105 U/L (ref 98–192)

## 2018-09-01 LAB — KAPPA/LAMBDA LIGHT CHAINS
KAPPA FREE LGHT CHN: 14.7 mg/L (ref 3.3–19.4)
Kappa, lambda light chain ratio: 0.62 (ref 0.26–1.65)
Lambda free light chains: 23.9 mg/L (ref 5.7–26.3)

## 2018-09-01 LAB — IGG, IGA, IGM
IgA: 44 mg/dL — ABNORMAL LOW (ref 64–422)
IgG (Immunoglobin G), Serum: 715 mg/dL (ref 700–1600)
IgM (Immunoglobulin M), Srm: 2854 mg/dL — ABNORMAL HIGH (ref 26–217)

## 2018-09-01 LAB — BETA 2 MICROGLOBULIN, SERUM: BETA 2 MICROGLOBULIN: 2 mg/L (ref 0.6–2.4)

## 2018-09-02 LAB — VISCOSITY, SERUM: VISCOSITY, SERUM: 2.4 rel.saline — AB (ref 1.6–1.9)

## 2018-09-03 LAB — PROTEIN ELECTROPHORESIS, SERUM
A/G Ratio: 0.8 (ref 0.7–1.7)
ALPHA-1-GLOBULIN: 0.2 g/dL (ref 0.0–0.4)
ALPHA-2-GLOBULIN: 0.9 g/dL (ref 0.4–1.0)
Albumin ELP: 3.4 g/dL (ref 2.9–4.4)
BETA GLOBULIN: 0.7 g/dL (ref 0.7–1.3)
Gamma Globulin: 2.3 g/dL — ABNORMAL HIGH (ref 0.4–1.8)
Globulin, Total: 4.1 g/dL — ABNORMAL HIGH (ref 2.2–3.9)
M-SPIKE, %: 1.7 g/dL — AB
Total Protein ELP: 7.5 g/dL (ref 6.0–8.5)

## 2018-09-07 ENCOUNTER — Encounter: Payer: Self-pay | Admitting: Gastroenterology

## 2018-09-07 ENCOUNTER — Other Ambulatory Visit: Payer: Self-pay

## 2018-09-07 ENCOUNTER — Encounter (HOSPITAL_COMMUNITY): Payer: Self-pay | Admitting: Internal Medicine

## 2018-09-07 ENCOUNTER — Inpatient Hospital Stay (HOSPITAL_COMMUNITY): Payer: Medicare Other | Admitting: Internal Medicine

## 2018-09-07 VITALS — BP 133/57 | HR 56 | Temp 98.4°F | Resp 16 | Wt 162.4 lb

## 2018-09-07 DIAGNOSIS — I10 Essential (primary) hypertension: Secondary | ICD-10-CM

## 2018-09-07 DIAGNOSIS — D696 Thrombocytopenia, unspecified: Secondary | ICD-10-CM

## 2018-09-07 DIAGNOSIS — D472 Monoclonal gammopathy: Secondary | ICD-10-CM

## 2018-09-07 DIAGNOSIS — D72819 Decreased white blood cell count, unspecified: Secondary | ICD-10-CM | POA: Diagnosis not present

## 2018-09-07 NOTE — Progress Notes (Signed)
Diagnosis MGUS (monoclonal gammopathy of unknown significance) - Plan: CBC with Differential/Platelet, Comprehensive metabolic panel, Lactate dehydrogenase, Protein electrophoresis, serum, IgG, IgA, IgM, Ferritin, Kappa/lambda light chains, Viscosity, Serum  Staging Cancer Staging No matching staging information was found for the patient.  Assessment and Plan:  1.  MGUS with IgM monoclonal protein with lambda light chains and 3% plasma cells on bone marrow.  Pt was previously followed by Dr. Talbert Cage.  SPEP revealed an IgM monoclonal protein with lambda light chains. Bone marrow biopsy was negative for excess lymphoplasmacytoid cells as seen in [place] macroglobulinemia. She is suspected of having IgM MGUS.    Labs done 08/31/2018 reviewed and showed WBC 3.6 HB 11.4 plts 126,000.  Chemistries WNl with K+ 3.7, Ca 10.1 Cr 0.74 and normal LFTs.  SPEP 1.7 g/dl,  Kappa/lambda light chains ratio normal.  Last skeletal survey was done 08/2011 and showed no lytic lesions.  Serum viscosity 2.4.  Pt remains asymptomatic and she will RTC in 12/2018 for follow-up and repeat labs.    2.  Leukopenia and thrombocytopenia.  CBC done 08/31/2018 shows WBC 3.6 and plts 126,000.  Will repeat labs in 12/2018.    3.  HTN.  BP is 133/57.   Follow-up with PCP.   4  Health maintenance.  Continue mammogram and colonoscopy evaluation as recommended.    Current Status:  Pt is seen today for follow-up.  She is here to go over labs.  Pt reports she is doing well.    PATHOLOGY: Patient: ERI, MCEVERS Collected: 05/26/2017 Client: [facility] Accession: [id number] Received: 05/26/2017 John "Ronny Bacon, MD DOB: Jun 17, 1946 Age: 72 Gender: F Reported: 05/29/2017 [place] Patient Ph: [phone] MRN #: [id number] [place], [place] [address] Visit #: [id number] Chart #: Phone: [phone] Fax: CC: Twana First, MD BONE MARROW REPORT FINAL DIAGNOSIS Diagnosis Bone Marrow, Aspirate,Biopsy, and  Clot, left iliac crest - HYPERCELLULAR BONE MARROW FOR AGE WITH TRILINEAGE HEMATOPOIESIS. - A MINOR POPULATION OF MONOCLONAL PLASMA CELLS. - SLIGHT LYMPHOCYTOSIS. - SEE COMMENT. PERIPHERAL BLOOD: - LEUKOPENIA. - THROMBOCYTOPENIA. Diagnosis Note The bone marrow is hypercellular for age with trilineage hematopoiesis and non specific myeloid changes. Significant dyspoiesis or increase in blastic cells is not identified. The plasma cells represent 3% of all cells in the aspirate associated with scattered small clusters in the clot and biopsy sections. Immunohistochemical stains show that the plasma cells are lambda light chain restricted. Despite limited findings, the changes are most consistent with early involvement by plasma cell dyscrasia/neoplasm. The lymphocytic component is slightly increased in number (23%) and mostly consisting of small lymphoid cells. The lymphocytosis is primarily interstitial and only a rare minute lymphoid aggregate is seen in the core biopsy/clot sections. Flow cytometric analysis shows predominance of T lymphocytes with no abnormal phenotype. B cells represent a minor population with slight kappa light chain excess but no definite monoclonality or abnormal phenotype. Immunohistochemical stains were also performed and failed to show a significant increase in B cells or an apparently abnormal T cell lymphoid component. Hence the lymphocytosis is not necessarily a part of the plasma cell neoplastic process and is likely reactive in nature. Correlation with cytogenetic studies is recommended. (BNS:gt, 06/02/17) Susanne Greenhouse MD Pathologist, Electronic Signature  Interpretation Bone Marrow Flow Cytometry - PREDOMINANCE OF T LYMPHOCYTES WITH NO ABNORMAL PHENOTYPE. - MINOR B-CELL POPULATION PRESENT. - SEE NOTE. Diagnosis Comment: Analysis of the lymphoid population shows predominance of T lymphocytes expressing pan T-cell antigens with no abnormal phenotype. B-cells  represent a minor  population (20% of lymphocytes) with expression of pan B-cell antigens associated with slight kappa light chain excess, but no definite monoclonality. In addition, an abnormal B-cell phenotype such as expression of CD5, CD10, or CD103 is not identified. The findings are not considered specific or diagnostic of a lymphoproliferative process. Clinical correlation is recommended. (BNS:ah 05/29/17) Susanne Greenhouse MD Pathologist, Electronic Signature  Problem List Patient Active Problem List   Diagnosis Date Noted  . Diarrhea [R19.7] 06/23/2018  . Diverticulitis [K57.92] 04/27/2018  . Constipation [K59.00] 03/18/2018  . Change in skin mole [D22.9] 02/18/2018  . MGUS (monoclonal gammopathy of unknown significance) [D47.2] 09/17/2017  . Hyperglycemia [R73.9] 10/21/2016  . Somnolence, daytime [R40.0] 04/11/2016  . Fatigue [R53.83] 04/05/2016  . Internal hemorrhoid [K64.8] 04/20/2014  . Iron deficiency [E61.1] 03/16/2014  . Screening mammogram, encounter for [Z12.31] 03/01/2014  . Uterine prolapse [N81.4] 03/01/2014  . Esophageal dysphagia [R13.10] 01/05/2013  . Stress reaction [F43.0] 09/01/2012  . Hyperlipidemia [E78.5] 04/28/2012  . Routine general medical examination at a health care facility [Z00.00] 04/20/2012  . Obesity [E66.9] 02/05/2012  . Other neutropenia ([place]) [D70.8] 10/23/2010  . Thrombocytopenia ([place]) [D69.6] 10/24/2009  . POSTMENOPAUSAL STATUS [N95.1] 08/09/2008  . DEPRESSION [F32.9] 08/05/2007  . Essential hypertension [I10] 08/05/2007  . GERD [K21.9] 04/20/2007  . DIVERTICULAR DISEASE [K57.30] 04/20/2007  . History of colonic polyps [Z86.010] 04/13/2007    Past Medical History Past Medical History:  Diagnosis Date  . Adenomatous polyp of colon 03/2007  . Anemia    s/p GI bleed, hospital 5/08  . Anxiety   . Depression   . Diverticulosis   . Endometriosis    uterine US 06/2003  . GERD (gastroesophageal reflux disease)   . Hiatal hernia    EGD 5/08-  stricture, HH, gastritis, GERD  . Hyperlipidemia   . Hypertension   . Hypertension   . Iron deficiency 03/16/2014    Past Surgical History Past Surgical History:  Procedure Laterality Date  . cardiac CT  9/12   normal   . HAMMER TOE SURGERY Left 12/02/2017  . KNEE SURGERY Left    Cyst behind left knee.  . TUBAL LIGATION      Family History Family History  Problem Relation Age of Onset  . Cancer Father        unknown type  . Hypertension Mother   . Colon cancer Neg Hx   . Esophageal cancer Neg Hx   . Rectal cancer Neg Hx   . Stomach cancer Neg Hx      Social History  reports that she has never smoked. She has never used smokeless tobacco. She reports that she does not drink alcohol or use drugs.  Medications  Current Outpatient Medications:  .  atorvastatin (LIPITOR) 10 MG tablet, Take 1 tablet (10 mg total) by mouth daily., Disp: 90 tablet, Rfl: 3 .  cholecalciferol (VITAMIN D) 1000 units tablet, Take 5,000 Units by mouth daily., Disp: , Rfl:  .  Esomeprazole Magnesium (NEXIUM 24HR PO), Take 20 mg by mouth daily. , Disp: , Rfl:  .  hydrochlorothiazide (HYDRODIURIL) 25 MG tablet, Take 1 tablet (25 mg total) by mouth daily., Disp: 90 tablet, Rfl: 3 .  Multiple Vitamins-Minerals (CENTRUM PO), Take 1 tablet by mouth 2 (two) times a week. Take one by mouth daily, Disp: , Rfl:  .  sertraline (ZOLOFT) 50 MG tablet, TAKE 1 TABLETBY MOUTH EVERY DAY (Patient taking differently: Take 50 mg by mouth as needed. TAKE 1 TABLETBY MOUTH EVERY DAY), Disp: 90  tablet, Rfl: 3  Allergies Patient has no known allergies.  Review of Systems Review of Systems - Oncology ROS as per HPI otherwise 12 point ROS is negative.   Physical Exam  Vitals Wt Readings from Last 3 Encounters:  09/07/18 162 lb 6.4 oz (73.7 kg)  08/18/18 165 lb (74.8 kg)  08/04/18 165 lb 3.2 oz (74.9 kg)   Temp Readings from Last 3 Encounters:  09/07/18 98.4 F (36.9 C) (Oral)  08/18/18 97.8 F (36.6 C)  06/23/18  98.1 F (36.7 C) (Oral)   BP Readings from Last 3 Encounters:  09/07/18 (!) 133/57  08/18/18 (!) 113/47  06/23/18 126/74   Pulse Readings from Last 3 Encounters:  09/07/18 (!) 56  08/18/18 (!) 59  06/23/18 65   Constitutional: Well-developed, well-nourished, and in no distress.   HENT: Head: Normocephalic and atraumatic.  Mouth/Throat: No oropharyngeal exudate. Mucosa moist. Eyes: Pupils are equal, round, and reactive to light. Conjunctivae are normal. No scleral icterus.  Neck: Normal range of motion. Neck supple. No JVD present.  Cardiovascular: Normal rate, regular rhythm and normal heart sounds.  Exam reveals no gallop and no friction rub.   No murmur heard. Pulmonary/Chest: Effort normal and breath sounds normal. No respiratory distress. No wheezes.No rales.  Abdominal: Soft. Bowel sounds are normal. No distension. There is no tenderness. There is no guarding.  Musculoskeletal: No edema or tenderness.  Lymphadenopathy: No cervical, axillary or supraclavicular adenopathy.  Neurological: Alert and oriented to person, place, and time. No cranial nerve deficit.  Skin: Skin is warm and dry. No rash noted. No erythema. No pallor.  Psychiatric: Affect and judgment normal.   Labs No visits with results within 3 Day(s) from this visit.  Latest known visit with results is:  Appointment on 08/31/2018  Component Date Value Ref Range Status  . WBC 08/31/2018 3.6* 4.0 - 10.5 K/uL Final  . RBC 08/31/2018 3.71* 3.87 - 5.11 MIL/uL Final  . Hemoglobin 08/31/2018 11.4* 12.0 - 15.0 g/dL Final  . HCT 08/31/2018 37.3  36.0 - 46.0 % Final  . MCV 08/31/2018 100.5* 80.0 - 100.0 fL Final  . MCH 08/31/2018 30.7  26.0 - 34.0 pg Final  . MCHC 08/31/2018 30.6  30.0 - 36.0 g/dL Final  . RDW 08/31/2018 13.2  11.5 - 15.5 % Final  . Platelets 08/31/2018 126* 150 - 400 K/uL Final  . nRBC 08/31/2018 0.0  0.0 - 0.2 % Final  . Neutrophils Relative % 08/31/2018 40  % Final  . Neutro Abs 08/31/2018 1.4* 1.7  - 7.7 K/uL Final  . Lymphocytes Relative 08/31/2018 47  % Final  . Lymphs Abs 08/31/2018 1.7  0.7 - 4.0 K/uL Final  . Monocytes Relative 08/31/2018 11  % Final  . Monocytes Absolute 08/31/2018 0.4  0.1 - 1.0 K/uL Final  . Eosinophils Relative 08/31/2018 2  % Final  . Eosinophils Absolute 08/31/2018 0.1  0.0 - 0.5 K/uL Final  . Basophils Relative 08/31/2018 0  % Final  . Basophils Absolute 08/31/2018 0.0  0.0 - 0.1 K/uL Final  . Immature Granulocytes 08/31/2018 0  % Final  . Abs Immature Granulocytes 08/31/2018 0.00  0.00 - 0.07 K/uL Final   Performed at [facility], [address], [place], [place] [address]  . Sodium 08/31/2018 139  135 - 145 mmol/L Final  . Potassium 08/31/2018 3.7  3.5 - 5.1 mmol/L Final  . Chloride 08/31/2018 102  98 - 111 mmol/L Final  . CO2 08/31/2018 26  22 - 32  mmol/L Final  . Glucose, Bld 08/31/2018 100* 70 - 99 mg/dL Final  . BUN 08/31/2018 11  8 - 23 mg/dL Final  . Creatinine, Ser 08/31/2018 0.74  0.44 - 1.00 mg/dL Final  . Calcium 08/31/2018 10.1  8.9 - 10.3 mg/dL Final  . Total Protein 08/31/2018 8.0  6.5 - 8.1 g/dL Final  . Albumin 08/31/2018 3.2* 3.5 - 5.0 g/dL Final  . AST 08/31/2018 24  15 - 41 U/L Final  . ALT 08/31/2018 20  0 - 44 U/L Final  . Alkaline Phosphatase 08/31/2018 72  38 - 126 U/L Final  . Total Bilirubin 08/31/2018 0.8  0.3 - 1.2 mg/dL Final  . GFR calc non Af Amer 08/31/2018 >60  >60 mL/min Final  . GFR calc Af Amer 08/31/2018 >60  >60 mL/min Final   Comment: (NOTE) The eGFR has been calculated using the CKD EPI equation. This calculation has not been validated in all clinical situations. eGFR's persistently <60 mL/min signify possible Chronic Kidney Disease.   Georgiann Hahn gap 08/31/2018 11  5 - 15 Final   Performed at [facility], [address], [place], [place] [address]  . LDH 08/31/2018 105  98 - 192 U/L Final   Performed at [facility], [address], [place], [place] [address]  . Total Protein ELP 08/31/2018 7.5  6.0 - 8.5  g/dL Final  . Albumin ELP 08/31/2018 3.4  2.9 - 4.4 g/dL Final  . Alpha-1-Globulin 08/31/2018 0.2  0.0 - 0.4 g/dL Final  . Alpha-2-Globulin 08/31/2018 0.9  0.4 - 1.0 g/dL Final  . Beta Globulin 08/31/2018 0.7  0.7 - 1.3 g/dL Final  . Gamma Globulin 08/31/2018 2.3* 0.4 - 1.8 g/dL Final  . M-Spike, % 08/31/2018 1.7* Not Observed g/dL Final  . SPE Interp. 08/31/2018 Comment   Final   Comment: (NOTE) The SPE pattern demonstrates a single peak (M-spike) in the gamma region which may represent monoclonal protein. This peak may also be caused by circulating immune complexes, cryoglobulins, C-reactive protein, fibrinogen or hemolysis.  If clinically indicated, the presence of a monoclonal gammopathy may be confirmed by immuno- fixation, as well as an evaluation of the urine for the presence of Bence-Jones protein. Performed At: [facility] [place], [place] [id number] Rush Farmer MD [id number]   . Comment 08/31/2018 Comment   Final   Comment: (NOTE) Protein electrophoresis scan will follow via computer, mail, or courier delivery.   Marland Kitchen GLOBULIN, TOTAL 08/31/2018 4.1* 2.2 - 3.9 g/dL Corrected  . A/G Ratio 08/31/2018 0.8  0.7 - 1.7 Corrected  . Ferritin 08/31/2018 50  11 - 307 ng/mL Final   Performed at [facility], [address], [place],  [address]  . Beta-2 Microglobulin 08/31/2018 2.0  0.6 - 2.4 mg/L Final   Comment: (NOTE) Siemens Immulite 2000 Immunochemiluminometric assay (ICMA) Values obtained with different assay methods or kits cannot be used interchangeably. Results cannot be interpreted as absolute evidence of the presence or absence of malignant disease. Performed At: [facility] [place], [place] [id number] Rush Farmer MD FG:[id number]   . Kappa free light chain 08/31/2018 14.7  3.3 - 19.4 mg/L Final  . Lamda free light chains 08/31/2018 23.9  5.7 - 26.3 mg/L Final  . Kappa, lamda light chain ratio 08/31/2018  0.62  0.26 - 1.65 Final   Comment: (NOTE) Performed At: [facility] [place], [place] [id number] Rush Farmer MD BP:[id number]   . IgG (Immunoglobin G), Serum 08/31/2018  715  700 - 1,600 mg/dL Final  . IgA 08/31/2018 44* 64 - 422 mg/dL Final   Result confirmed on concentration.  . IgM (Immunoglobulin M), Srm 08/31/2018 2,854* 26 - 217 mg/dL Final   Comment: (NOTE) Results confirmed on dilution. Performed At: [facility] [place], [place] [id number] Rush Farmer MD BD:[id number]   . Viscosity, Serum 08/31/2018 2.4* 1.6 - 1.9 rel.saline Final   Comment: (NOTE) Values above 2.7 may indicate paraproteinemia is present. This test was developed and its performance characteristics determined by [facility]. It has not been cleared or approved by the Food and Drug Administration. Performed At: [facility] [place], [place] [id number] Rush Farmer MD [id number]      Pathology Orders Placed This Encounter  Procedures  . CBC with Differential/Platelet    Standing Status:   Future    Standing Expiration Date:   09/07/2020  . Comprehensive metabolic panel    Standing Status:   Future    Standing Expiration Date:   09/07/2020  . Lactate dehydrogenase    Standing Status:   Future    Standing Expiration Date:   09/07/2020  . Protein electrophoresis, serum    Standing Status:   Future    Standing Expiration Date:   09/07/2020  . IgG, IgA, IgM    Standing Status:   Future    Standing Expiration Date:   09/07/2020  . Ferritin    Standing Status:   Future    Standing Expiration Date:   09/07/2020  . Kappa/lambda light chains    Standing Status:   Future    Standing Expiration Date:   09/07/2020  . Viscosity, Serum    Standing Status:   Future    Standing Expiration Date:   09/07/2020       Zoila Shutter MD

## 2019-01-04 ENCOUNTER — Inpatient Hospital Stay (HOSPITAL_COMMUNITY): Payer: Medicare Other | Attending: Hematology

## 2019-01-04 DIAGNOSIS — D72829 Elevated white blood cell count, unspecified: Secondary | ICD-10-CM | POA: Insufficient documentation

## 2019-01-04 DIAGNOSIS — D472 Monoclonal gammopathy: Secondary | ICD-10-CM | POA: Insufficient documentation

## 2019-01-04 DIAGNOSIS — D696 Thrombocytopenia, unspecified: Secondary | ICD-10-CM | POA: Insufficient documentation

## 2019-01-04 LAB — COMPREHENSIVE METABOLIC PANEL
ALT: 17 U/L (ref 0–44)
ANION GAP: 8 (ref 5–15)
AST: 18 U/L (ref 15–41)
Albumin: 3.6 g/dL (ref 3.5–5.0)
Alkaline Phosphatase: 88 U/L (ref 38–126)
BUN: 12 mg/dL (ref 8–23)
CHLORIDE: 100 mmol/L (ref 98–111)
CO2: 29 mmol/L (ref 22–32)
CREATININE: 0.85 mg/dL (ref 0.44–1.00)
Calcium: 10.4 mg/dL — ABNORMAL HIGH (ref 8.9–10.3)
Glucose, Bld: 102 mg/dL — ABNORMAL HIGH (ref 70–99)
POTASSIUM: 3.7 mmol/L (ref 3.5–5.1)
Sodium: 137 mmol/L (ref 135–145)
Total Bilirubin: 0.4 mg/dL (ref 0.3–1.2)
Total Protein: 8.4 g/dL — ABNORMAL HIGH (ref 6.5–8.1)

## 2019-01-04 LAB — CBC WITH DIFFERENTIAL/PLATELET
BASOS PCT: 0 %
BLASTS: 0 %
Band Neutrophils: 0 %
Basophils Absolute: 0 10*3/uL (ref 0.0–0.1)
EOS PCT: 1 %
Eosinophils Absolute: 0 10*3/uL (ref 0.0–0.5)
HCT: 39.3 % (ref 36.0–46.0)
Hemoglobin: 12.1 g/dL (ref 12.0–15.0)
LYMPHS ABS: 1.3 10*3/uL (ref 0.7–4.0)
Lymphocytes Relative: 39 %
MCH: 30.4 pg (ref 26.0–34.0)
MCHC: 30.8 g/dL (ref 30.0–36.0)
MCV: 98.7 fL (ref 80.0–100.0)
METAMYELOCYTES PCT: 0 %
MONOS PCT: 10 %
MYELOCYTES: 0 %
Monocytes Absolute: 0.3 10*3/uL (ref 0.1–1.0)
NEUTROS ABS: 1.7 10*3/uL (ref 1.7–7.7)
NEUTROS PCT: 50 %
OTHER: 0 %
PLATELETS: 115 10*3/uL — AB (ref 150–400)
Promyelocytes Relative: 0 %
RBC: 3.98 MIL/uL (ref 3.87–5.11)
RDW: 13 % (ref 11.5–15.5)
WBC: 3.3 10*3/uL — ABNORMAL LOW (ref 4.0–10.5)
nRBC: 0 % (ref 0.0–0.2)
nRBC: 0 /100 WBC

## 2019-01-04 LAB — FERRITIN: FERRITIN: 48 ng/mL (ref 11–307)

## 2019-01-04 LAB — LACTATE DEHYDROGENASE: LDH: 97 U/L — AB (ref 98–192)

## 2019-01-05 LAB — PROTEIN ELECTROPHORESIS, SERUM
A/G Ratio: 0.8 (ref 0.7–1.7)
ALPHA-1-GLOBULIN: 0.3 g/dL (ref 0.0–0.4)
ALPHA-2-GLOBULIN: 0.9 g/dL (ref 0.4–1.0)
Albumin ELP: 3.3 g/dL (ref 2.9–4.4)
Beta Globulin: 0.8 g/dL (ref 0.7–1.3)
GLOBULIN, TOTAL: 4.3 g/dL — AB (ref 2.2–3.9)
Gamma Globulin: 2.4 g/dL — ABNORMAL HIGH (ref 0.4–1.8)
M-Spike, %: 1.7 g/dL — ABNORMAL HIGH
Total Protein ELP: 7.6 g/dL (ref 6.0–8.5)

## 2019-01-05 LAB — IGG, IGA, IGM
IGA: 56 mg/dL — AB (ref 64–422)
IGM (IMMUNOGLOBULIN M), SRM: 2703 mg/dL — AB (ref 26–217)
IgG (Immunoglobin G), Serum: 812 mg/dL (ref 700–1600)

## 2019-01-05 LAB — KAPPA/LAMBDA LIGHT CHAINS
KAPPA FREE LGHT CHN: 15.2 mg/L (ref 3.3–19.4)
KAPPA, LAMDA LIGHT CHAIN RATIO: 0.46 (ref 0.26–1.65)
LAMDA FREE LIGHT CHAINS: 33 mg/L — AB (ref 5.7–26.3)

## 2019-01-06 LAB — VISCOSITY, SERUM: Viscosity, Serum: 2.2 rel.saline — ABNORMAL HIGH (ref 1.6–1.9)

## 2019-01-11 ENCOUNTER — Ambulatory Visit (HOSPITAL_COMMUNITY): Payer: Medicare Other | Admitting: Hematology

## 2019-01-13 ENCOUNTER — Ambulatory Visit (HOSPITAL_COMMUNITY): Payer: Medicare Other | Admitting: Internal Medicine

## 2019-01-19 ENCOUNTER — Other Ambulatory Visit: Payer: Self-pay | Admitting: Family Medicine

## 2019-01-19 DIAGNOSIS — Z1231 Encounter for screening mammogram for malignant neoplasm of breast: Secondary | ICD-10-CM

## 2019-02-23 ENCOUNTER — Ambulatory Visit: Payer: Medicare Other

## 2019-02-24 ENCOUNTER — Ambulatory Visit: Payer: Medicare Other

## 2019-03-03 ENCOUNTER — Encounter: Payer: Medicare Other | Admitting: Family Medicine

## 2019-03-18 ENCOUNTER — Other Ambulatory Visit: Payer: Self-pay | Admitting: Family Medicine

## 2019-03-25 ENCOUNTER — Other Ambulatory Visit: Payer: Self-pay | Admitting: Family Medicine

## 2019-05-13 ENCOUNTER — Other Ambulatory Visit: Payer: Self-pay | Admitting: Family Medicine

## 2019-07-21 ENCOUNTER — Ambulatory Visit: Payer: Medicare Other

## 2019-07-22 ENCOUNTER — Other Ambulatory Visit: Payer: Self-pay | Admitting: Family Medicine

## 2019-08-02 DIAGNOSIS — Z20822 Contact with and (suspected) exposure to covid-19: Secondary | ICD-10-CM | POA: Insufficient documentation

## 2019-08-17 ENCOUNTER — Other Ambulatory Visit: Payer: Medicare Other

## 2019-08-23 ENCOUNTER — Telehealth: Payer: Self-pay | Admitting: Family Medicine

## 2019-08-23 DIAGNOSIS — I10 Essential (primary) hypertension: Secondary | ICD-10-CM

## 2019-08-23 DIAGNOSIS — R7303 Prediabetes: Secondary | ICD-10-CM

## 2019-08-23 DIAGNOSIS — D696 Thrombocytopenia, unspecified: Secondary | ICD-10-CM

## 2019-08-23 NOTE — Telephone Encounter (Signed)
-----   Message from Ellamae Sia sent at 08/18/2019  9:40 AM EDT ----- Regarding: Lab orders for Tuesday, 10.13.20 Patient is scheduled for CPX labs, please order future labs, Thanks , Karna Christmas

## 2019-08-24 ENCOUNTER — Other Ambulatory Visit (INDEPENDENT_AMBULATORY_CARE_PROVIDER_SITE_OTHER): Payer: Medicare Other

## 2019-08-24 ENCOUNTER — Other Ambulatory Visit: Payer: Self-pay

## 2019-08-24 ENCOUNTER — Ambulatory Visit: Payer: Medicare Other

## 2019-08-24 ENCOUNTER — Ambulatory Visit (INDEPENDENT_AMBULATORY_CARE_PROVIDER_SITE_OTHER): Payer: Medicare Other

## 2019-08-24 DIAGNOSIS — Z0000 Encounter for general adult medical examination without abnormal findings: Secondary | ICD-10-CM | POA: Diagnosis not present

## 2019-08-24 DIAGNOSIS — D696 Thrombocytopenia, unspecified: Secondary | ICD-10-CM

## 2019-08-24 DIAGNOSIS — R7303 Prediabetes: Secondary | ICD-10-CM

## 2019-08-24 DIAGNOSIS — I10 Essential (primary) hypertension: Secondary | ICD-10-CM

## 2019-08-24 LAB — COMPREHENSIVE METABOLIC PANEL
ALT: 20 U/L (ref 0–35)
AST: 20 U/L (ref 0–37)
Albumin: 3.4 g/dL — ABNORMAL LOW (ref 3.5–5.2)
Alkaline Phosphatase: 68 U/L (ref 39–117)
BUN: 14 mg/dL (ref 6–23)
CO2: 31 mEq/L (ref 19–32)
Calcium: 10.7 mg/dL — ABNORMAL HIGH (ref 8.4–10.5)
Chloride: 104 mEq/L (ref 96–112)
Creatinine, Ser: 0.75 mg/dL (ref 0.40–1.20)
GFR: 91.84 mL/min (ref >60.00)
Glucose, Bld: 98 mg/dL (ref 70–99)
Potassium: 3.9 mEq/L (ref 3.5–5.1)
Sodium: 141 mEq/L (ref 135–145)
Total Bilirubin: 0.4 mg/dL (ref 0.2–1.2)
Total Protein: 8 g/dL (ref 6.0–8.3)

## 2019-08-24 LAB — CBC WITH DIFFERENTIAL/PLATELET
Basophils Absolute: 0 10*3/uL (ref 0.0–0.1)
Basophils Relative: 0.4 % (ref 0.0–3.0)
Eosinophils Absolute: 0.1 10*3/uL (ref 0.0–0.7)
Eosinophils Relative: 2.2 % (ref 0.0–5.0)
HCT: 35 % — ABNORMAL LOW (ref 36.0–46.0)
Hemoglobin: 11.6 g/dL — ABNORMAL LOW (ref 12.0–15.0)
Lymphocytes Relative: 43 % (ref 12.0–46.0)
Lymphs Abs: 1.4 10*3/uL (ref 0.7–4.0)
MCHC: 33.1 g/dL (ref 30.0–36.0)
MCV: 96.1 fl (ref 78.0–100.0)
Monocytes Absolute: 0.5 10*3/uL (ref 0.1–1.0)
Monocytes Relative: 14.7 % — ABNORMAL HIGH (ref 3.0–12.0)
Neutro Abs: 1.3 10*3/uL — ABNORMAL LOW (ref 1.4–7.7)
Neutrophils Relative %: 39.7 % — ABNORMAL LOW (ref 43.0–77.0)
Platelets: 111 10*3/uL — ABNORMAL LOW (ref 150.0–400.0)
RBC: 3.64 Mil/uL — ABNORMAL LOW (ref 3.87–5.11)
RDW: 13.9 % (ref 11.5–15.5)
WBC: 3.3 10*3/uL — ABNORMAL LOW (ref 4.0–10.5)

## 2019-08-24 LAB — LIPID PANEL
Cholesterol: 133 mg/dL (ref 0–200)
HDL: 45.3 mg/dL (ref >39.00)
LDL Cholesterol: 75 mg/dL (ref 0–99)
NonHDL: 87.77
Total CHOL/HDL Ratio: 3
Triglycerides: 64 mg/dL (ref 0.0–149.0)
VLDL: 12.8 mg/dL (ref 0.0–40.0)

## 2019-08-24 LAB — HEMOGLOBIN A1C: Hgb A1c MFr Bld: 6.6 % — ABNORMAL HIGH (ref 4.6–6.5)

## 2019-08-24 LAB — TSH: TSH: 2.44 u[IU]/mL (ref 0.35–4.50)

## 2019-08-24 NOTE — Progress Notes (Signed)
PCP notes: none  Health Maintenance: Patient states she will schedule mammogram and Dexa scan soon. Patient will get flu vaccine at upcoming physical. Declined Tdap and Shingrix.     Abnormal Screenings: none    Patient concerns: Patient complains of fatigue post COVID-19.     Nurse concerns: none    Next PCP appt.: 08/31/2019 @ 8:30 am

## 2019-08-24 NOTE — Patient Instructions (Signed)
[name] , Thank you for taking time to come for your Medicare Wellness Visit. I appreciate your ongoing commitment to your health goals. Please review the following plan we discussed and let me know if I can assist you in the future.   Screening recommendations/referrals: Colonoscopy: up to date, completed 08/18/2018 Mammogram: Patient will call and schedule appointment Bone Density: Patient will call and schedule appointment Recommended yearly ophthalmology/optometry visit for glaucoma screening and checkup Recommended yearly dental visit for hygiene and checkup  Vaccinations: Influenza vaccine: Patient will get at next week's office visit Pneumococcal vaccine: series completed Tdap vaccine: declined Shingles vaccine: declined    Advanced directives: Advance directive discussed with you today. Even though you declined this today please call our office should you change your mind and we can give you the proper paperwork for you to fill out.  Conditions/risks identified: hypertension, hyperlipidemia  Next appointment: 08/31/2019 @ 8:30 am    Preventive Care 72 Years and Older, Female Preventive care refers to lifestyle choices and visits with your health care provider that can promote health and wellness. What does preventive care include?  A yearly physical exam. This is also called an annual well check.  Dental exams once or twice a year.  Routine eye exams. Ask your health care provider how often you should have your eyes checked.  Personal lifestyle choices, including:  Daily care of your teeth and gums.  Regular physical activity.  Eating a healthy diet.  Avoiding tobacco and drug use.  Limiting alcohol use.  Practicing safe sex.  Taking low-dose aspirin every day.  Taking vitamin and mineral supplements as recommended by your health care provider. What happens during an annual well check? The services and screenings done by your health care provider during  your annual well check will depend on your age, overall health, lifestyle risk factors, and family history of disease. Counseling  Your health care provider may ask you questions about your:  Alcohol use.  Tobacco use.  Drug use.  Emotional well-being.  Home and relationship well-being.  Sexual activity.  Eating habits.  History of falls.  Memory and ability to understand (cognition).  Work and work Statistician.  Reproductive health. Screening  You may have the following tests or measurements:  Height, weight, and BMI.  Blood pressure.  Lipid and cholesterol levels. These may be checked every 5 years, or more frequently if you are over 24 years old.  Skin check.  Lung cancer screening. You may have this screening every year starting at age 70 if you have a 30-pack-year history of smoking and currently smoke or have quit within the past 15 years.  Fecal occult blood test (FOBT) of the stool. You may have this test every year starting at age 85.  Flexible sigmoidoscopy or colonoscopy. You may have a sigmoidoscopy every 5 years or a colonoscopy every 10 years starting at age 70.  Hepatitis C blood test.  Hepatitis B blood test.  Sexually transmitted disease (STD) testing.  Diabetes screening. This is done by checking your blood sugar (glucose) after you have not eaten for a while (fasting). You may have this done every 1-3 years.  Bone density scan. This is done to screen for osteoporosis. You may have this done starting at age 75.  Mammogram. This may be done every 1-2 years. Talk to your health care provider about how often you should have regular mammograms. Talk with your health care provider about your test results, treatment options, and if necessary,  the need for more tests. Vaccines  Your health care provider may recommend certain vaccines, such as:  Influenza vaccine. This is recommended every year.  Tetanus, diphtheria, and acellular pertussis (Tdap,  Td) vaccine. You may need a Td booster every 10 years.  Zoster vaccine. You may need this after age 31.  Pneumococcal 13-valent conjugate (PCV13) vaccine. One dose is recommended after age 22.  Pneumococcal polysaccharide (PPSV23) vaccine. One dose is recommended after age 51. Talk to your health care provider about which screenings and vaccines you need and how often you need them. This information is not intended to replace advice given to you by your health care provider. Make sure you discuss any questions you have with your health care provider. Document Released: 11/24/2015 Document Revised: 07/17/2016 Document Reviewed: 08/29/2015 Elsevier Interactive Patient Education  2017 [place] Prevention in the Home Falls can cause injuries. They can happen to people of all ages. There are many things you can do to make your home safe and to help prevent falls. What can I do on the outside of my home?  Regularly fix the edges of walkways and driveways and fix any cracks.  Remove anything that might make you trip as you walk through a door, such as a raised step or threshold.  Trim any bushes or trees on the path to your home.  Use bright outdoor lighting.  Clear any walking paths of anything that might make someone trip, such as rocks or tools.  Regularly check to see if handrails are loose or broken. Make sure that both sides of any steps have handrails.  Any raised decks and porches should have guardrails on the edges.  Have any leaves, snow, or ice cleared regularly.  Use sand or salt on walking paths during winter.  Clean up any spills in your garage right away. This includes oil or grease spills. What can I do in the bathroom?  Use night lights.  Install grab bars by the toilet and in the tub and shower. Do not use towel bars as grab bars.  Use non-skid mats or decals in the tub or shower.  If you need to sit down in the shower, use a plastic, non-slip stool.   Keep the floor dry. Clean up any water that spills on the floor as soon as it happens.  Remove soap buildup in the tub or shower regularly.  Attach bath mats securely with double-sided non-slip rug tape.  Do not have throw rugs and other things on the floor that can make you trip. What can I do in the bedroom?  Use night lights.  Make sure that you have a light by your bed that is easy to reach.  Do not use any sheets or blankets that are too big for your bed. They should not hang down onto the floor.  Have a firm chair that has side arms. You can use this for support while you get dressed.  Do not have throw rugs and other things on the floor that can make you trip. What can I do in the kitchen?  Clean up any spills right away.  Avoid walking on wet floors.  Keep items that you use a lot in easy-to-reach places.  If you need to reach something above you, use a strong step stool that has a grab bar.  Keep electrical cords out of the way.  Do not use floor polish or wax that makes floors slippery. If you must use  wax, use non-skid floor wax.  Do not have throw rugs and other things on the floor that can make you trip. What can I do with my stairs?  Do not leave any items on the stairs.  Make sure that there are handrails on both sides of the stairs and use them. Fix handrails that are broken or loose. Make sure that handrails are as long as the stairways.  Check any carpeting to make sure that it is firmly attached to the stairs. Fix any carpet that is loose or worn.  Avoid having throw rugs at the top or bottom of the stairs. If you do have throw rugs, attach them to the floor with carpet tape.  Make sure that you have a light switch at the top of the stairs and the bottom of the stairs. If you do not have them, ask someone to add them for you. What else can I do to help prevent falls?  Wear shoes that:  Do not have high heels.  Have rubber bottoms.  Are  comfortable and fit you well.  Are closed at the toe. Do not wear sandals.  If you use a stepladder:  Make sure that it is fully opened. Do not climb a closed stepladder.  Make sure that both sides of the stepladder are locked into place.  Ask someone to hold it for you, if possible.  Clearly mark and make sure that you can see:  Any grab bars or handrails.  First and last steps.  Where the edge of each step is.  Use tools that help you move around (mobility aids) if they are needed. These include:  Canes.  Walkers.  Scooters.  Crutches.  Turn on the lights when you go into a dark area. Replace any light bulbs as soon as they burn out.  Set up your furniture so you have a clear path. Avoid moving your furniture around.  If any of your floors are uneven, fix them.  If there are any pets around you, be aware of where they are.  Review your medicines with your doctor. Some medicines can make you feel dizzy. This can increase your chance of falling. Ask your doctor what other things that you can do to help prevent falls. This information is not intended to replace advice given to you by your health care provider. Make sure you discuss any questions you have with your health care provider. Document Released: 08/24/2009 Document Revised: 04/04/2016 Document Reviewed: 12/02/2014 Elsevier Interactive Patient Education  2017 [employer].

## 2019-08-24 NOTE — Progress Notes (Signed)
Subjective:   Kathryn Weaver is a 73 y.o. female who presents for Medicare Annual (Subsequent) preventive examination.  Review of Systems:    This visit is being conducted through telemedicine via telephone at the nurse health advisor's home address due to the COVID-19 pandemic. This patient has given me verbal consent via doximity to conduct this visit, patient states they are participating from their home address. Some vital signs may be absent or patient reported.    Patient identification: identified by name, DOB, and current address  Cardiac Risk Factors include: advanced age (>28men, >73 women);hypertension;dyslipidemia;sedentary lifestyle     Objective:     Vitals: There were no vitals taken for this visit.  There is no height or weight on file to calculate BMI.  Advanced Directives 08/24/2019 09/07/2018 04/01/2018 01/28/2018 09/17/2017 06/13/2017 04/29/2017  Does Patient Have a Medical Advance Directive? No No No Yes No No No  Type of Advance Directive - - - [place] in Chart? - - - No - copy requested - - -  Would patient like information on creating a medical advance directive? No - Patient declined No - Patient declined No - Patient declined - No - Patient declined No - Patient declined No - Patient declined    Tobacco Social History   Tobacco Use  Smoking Status Never Smoker  Smokeless Tobacco Never Used     Counseling given: Not Answered   Clinical Intake:  Pre-visit preparation completed: Yes  Pain : No/denies pain     Nutritional Risks: None Diabetes: No  How often do you need to have someone help you when you read instructions, pamphlets, or other written materials from your doctor or pharmacy?: 1 - Never What is the last grade level you completed in school?: 12th  Interpreter Needed?: No  Information entered by :: CJohnson, LPN  Past Medical History:  Diagnosis Date  . Adenomatous  polyp of colon 03/2007  . Anemia    s/p GI bleed, hospital 5/08  . Anxiety   . Depression   . Diverticulosis   . Endometriosis    uterine US 06/2003  . GERD (gastroesophageal reflux disease)   . Hiatal hernia    EGD 5/08- stricture, HH, gastritis, GERD  . Hyperlipidemia   . Hypertension   . Hypertension   . Iron deficiency 03/16/2014   Past Surgical History:  Procedure Laterality Date  . cardiac CT  9/12   normal   . HAMMER TOE SURGERY Left 12/02/2017  . KNEE SURGERY Left    Cyst behind left knee.  . TUBAL LIGATION     Family History  Problem Relation Age of Onset  . Cancer Father        unknown type  . Hypertension Mother   . Colon cancer Neg Hx   . Esophageal cancer Neg Hx   . Rectal cancer Neg Hx   . Stomach cancer Neg Hx    Social History   Socioeconomic History  . Marital status: Married    Spouse name: Not on file  . Number of children: Not on file  . Years of education: Not on file  . Highest education level: Not on file  Occupational History  . Occupation: [employer]  . Financial resource strain: Not hard at all  . Food insecurity    Worry: Never true    Inability: Never true  . Transportation needs  Medical: No    Non-medical: No  Tobacco Use  . Smoking status: Never Smoker  . Smokeless tobacco: Never Used  Substance and Sexual Activity  . Alcohol use: No    Alcohol/week: 0.0 standard drinks  . Drug use: No  . Sexual activity: Yes  Lifestyle  . Physical activity    Days per week: 0 days    Minutes per session: 0 min  . Stress: Not at all  Relationships  . Social Herbalist on phone: Not on file    Gets together: Not on file    Attends religious service: Not on file    Active member of club or organization: Not on file    Attends meetings of clubs or organizations: Not on file    Relationship status: Not on file  Other Topics Concern  . Not on file  Social History Narrative  . Not on file     Outpatient Encounter Medications as of 08/24/2019  Medication Sig  . atorvastatin (LIPITOR) 10 MG tablet TAKE 1 TABLET BY MOUTH EVERY DAY  . cholecalciferol (VITAMIN D) 1000 units tablet Take 5,000 Units by mouth daily.  . Esomeprazole Magnesium (NEXIUM 24HR PO) Take 20 mg by mouth daily.   . hydrochlorothiazide (HYDRODIURIL) 25 MG tablet TAKE 1 TABLET BY MOUTH EVERY DAY  . Multiple Vitamins-Minerals (CENTRUM PO) Take 1 tablet by mouth 2 (two) times a week. Take one by mouth daily  . sertraline (ZOLOFT) 50 MG tablet TAKE 1 TABLET BY MOUTH EVERY DAY  . [DISCONTINUED] ranitidine (ZANTAC) 150 MG capsule Take 1 capsule (150 mg total) by mouth 2 (two) times daily. For 3 months   No facility-administered encounter medications on file as of 08/24/2019.     Activities of Daily Living In your present state of health, do you have any difficulty performing the following activities: 08/24/2019  Hearing? 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 N  Difficulty concentrating or making decisions? N  Walking or climbing stairs? N  Dressing or bathing? N  Doing errands, shopping? N  Preparing Food and eating ? N  Using the Toilet? N  In the past six months, have you accidently leaked urine? N  Do you have problems with loss of bowel control? N  Managing your Medications? N  Managing your Finances? N  Housekeeping or managing your Housekeeping? N  Some recent data might be hidden    Patient Care Team: Tower, [name], MD as PCP - General Josue Hector, MD as PCP - Cardiology (Cardiology)    Assessment:   This is a routine wellness examination for Kathryn Weaver.  Exercise Activities and Dietary recommendations Current Exercise Habits: The patient does not participate in regular exercise at present, Exercise limited by: None identified  Goals    . DIET - EAT MORE FRUITS AND VEGETABLES     Starting 01/28/2018, I will continue to eat 4-5 servings of fruits and vegetables.    . Increase physical activity      Starting 1/11/16/16, I will continue to walk at least 30 min 1-2 days per week.     . Patient Stated     08/24/2019, I will maintain my weight where it is.        Fall Risk Fall Risk  08/24/2019 01/28/2018 11/26/2016 10/18/2015 03/01/2014  Falls in the past year? 0 No No No No  Risk for fall due to : Medication side effect - - - -  Follow up Falls evaluation  completed;Falls prevention discussed - - - -   Is the patient's home free of loose throw rugs in walkways, pet beds, electrical cords, etc?   yes      Grab bars in the bathroom? no      Handrails on the stairs?   no      Adequate lighting?   yes  Timed Get Up and Go performed: n/a  Depression Screen PHQ 2/9 Scores 08/24/2019 01/28/2018 11/26/2016 10/18/2015  PHQ - 2 Score 0 0 0 0  PHQ- 9 Score 0 0 - -     Cognitive Function MMSE - Mini Mental State Exam 08/24/2019 01/28/2018 11/26/2016  Orientation to time 5 5 5   Orientation to Place 5 5 5   Registration 3 3 3   Attention/ Calculation 5 0 0  Recall 3 3 3   Language- name 2 objects - 0 0  Language- repeat 1 1 1   Language- follow 3 step command - 3 3  Language- read & follow direction - 0 0  Write a sentence - 0 0  Copy design - 0 0  Total score - 20 20  Mini Cog  Mini-Cog screen was completed. Maximum score is 22. A value of 0 denotes this part of the MMSE was not completed or the patient failed this part of the Mini-Cog screening.      Immunization History  Administered Date(s) Administered  . Influenza,inj,Quad PF,6+ Mos 10/18/2015, 08/28/2016, 02/18/2018  . Pneumococcal Conjugate-13 10/18/2015  . Pneumococcal Polysaccharide-23 03/01/2014  . Td 03/26/1996, 08/09/2008    Qualifies for Shingles Vaccine? yes  Screening Tests Health Maintenance  Topic Date Due  . MAMMOGRAM  02/21/2019  . INFLUENZA VACCINE  06/12/2019  . TETANUS/TDAP  08/23/2020 (Originally 08/09/2018)  . DEXA SCAN  12/13/2020 (Originally 05/20/2012)  . COLONOSCOPY  08/19/2023  . Hepatitis C  Screening  Completed  . PNA vac Low Risk Adult  Completed    Cancer Screenings: Lung: Low Dose CT Chest recommended if Age 1-80 years, 30 pack-year currently smoking OR have quit w/in 15years. Patient does not qualify. Breast:  Up to date on Mammogram? No, Patient will call and schedule appointment   Up to date of Bone Density/Dexa? No, Patient will call and schedule appointment  Colorectal: completed 08/18/2018  Additional Screenings:  Hepatitis C Screening: 01/10/2014     Plan:    Patient wants to maintain her weight where it is.    I have personally reviewed and noted the following in the patient's chart:   . Medical and social history . Use of alcohol, tobacco or illicit drugs  . Current medications and supplements . Functional ability and status . Nutritional status . Physical activity . Advanced directives . List of other physicians . Hospitalizations, surgeries, and ER visits in previous 12 months . Vitals . Screenings to include cognitive, depression, and falls . Referrals and appointments  In addition, I have reviewed and discussed with patient certain preventive protocols, quality metrics, and best practice recommendations. A written personalized care plan for preventive services as well as general preventive health recommendations were provided to patient.     Andrez Grime, LPN  [id number]

## 2019-08-31 ENCOUNTER — Ambulatory Visit (INDEPENDENT_AMBULATORY_CARE_PROVIDER_SITE_OTHER): Payer: Medicare Other | Admitting: Family Medicine

## 2019-08-31 ENCOUNTER — Encounter: Payer: Self-pay | Admitting: Family Medicine

## 2019-08-31 ENCOUNTER — Other Ambulatory Visit: Payer: Self-pay

## 2019-08-31 VITALS — BP 118/64 | HR 87 | Temp 97.4°F | Ht 62.5 in | Wt 160.4 lb

## 2019-08-31 DIAGNOSIS — E2839 Other primary ovarian failure: Secondary | ICD-10-CM

## 2019-08-31 DIAGNOSIS — R7303 Prediabetes: Secondary | ICD-10-CM

## 2019-08-31 DIAGNOSIS — E7800 Pure hypercholesterolemia, unspecified: Secondary | ICD-10-CM

## 2019-08-31 DIAGNOSIS — D708 Other neutropenia: Secondary | ICD-10-CM

## 2019-08-31 DIAGNOSIS — D696 Thrombocytopenia, unspecified: Secondary | ICD-10-CM

## 2019-08-31 DIAGNOSIS — Z1231 Encounter for screening mammogram for malignant neoplasm of breast: Secondary | ICD-10-CM

## 2019-08-31 DIAGNOSIS — F418 Other specified anxiety disorders: Secondary | ICD-10-CM

## 2019-08-31 DIAGNOSIS — R5382 Chronic fatigue, unspecified: Secondary | ICD-10-CM

## 2019-08-31 DIAGNOSIS — I10 Essential (primary) hypertension: Secondary | ICD-10-CM | POA: Diagnosis not present

## 2019-08-31 DIAGNOSIS — Z23 Encounter for immunization: Secondary | ICD-10-CM

## 2019-08-31 DIAGNOSIS — Z0000 Encounter for general adult medical examination without abnormal findings: Secondary | ICD-10-CM | POA: Diagnosis not present

## 2019-08-31 DIAGNOSIS — D469 Myelodysplastic syndrome, unspecified: Secondary | ICD-10-CM | POA: Insufficient documentation

## 2019-08-31 MED ORDER — SERTRALINE HCL 50 MG PO TABS: ORAL_TABLET | ORAL | 3 refills | Status: DC

## 2019-08-31 MED ORDER — HYDROCHLOROTHIAZIDE 25 MG PO TABS: 25.0000 mg | ORAL_TABLET | Freq: Every day | ORAL | 3 refills | Status: DC

## 2019-08-31 MED ORDER — ATORVASTATIN CALCIUM 10 MG PO TABS: 10.0000 mg | ORAL_TABLET | Freq: Every day | ORAL | 3 refills | Status: DC

## 2019-08-31 NOTE — Assessment & Plan Note (Signed)
Continues tx with hematology ([facility]) Stable cbc

## 2019-08-31 NOTE — Patient Instructions (Addendum)
You have early diabetes / borderline  Try to get most of your carbohydrates from produce (with the exception of white potatoes)  Eat less bread/pasta/rice/snack foods/cereals/sweets and other items from the middle of the grocery store (processed carbs)   Follow up in 3 months for blood sugar   Flu shot today  Stop at check out to schedule mammogram and bone density test

## 2019-08-31 NOTE — Assessment & Plan Note (Signed)
bp in fair control at this time  BP Readings from Last 1 Encounters:  08/31/19 118/64   No changes needed Most recent labs reviewed  Disc lifstyle change with low sodium diet and exercise

## 2019-08-31 NOTE — Progress Notes (Signed)
Subjective:    Patient ID: Kathryn Weaver, female    DOB: [birth year], 73 y.o.   MRN: [id number]  HPI Here for health maintenance exam and to review chronic medical problems   Is feeling better for the most part-not quite 100 %  Still tried and this greatly distresses her   Wt Readings from Last 3 Encounters:  08/31/19 160 lb 6 oz (72.7 kg)  09/07/18 162 lb 6.4 oz (73.7 kg)  08/18/18 165 lb (74.8 kg)  good weight  28.87 kg/m   Whole family had covid this season   amw was done on 10/13  Noted she needed to schedule mammogram an dexa  Flu vaccine - getting today  Declines shingrix and Tdap vaccines    Mammogram 4/19 and was ordered in march-= re ordered today  Self breast exam   dexa -- ordered today No falls or fx Taking vit D Exercise - - taking care of grandchildren  Too tired to exercise right now after covid    Colonoscopy 10/19 with 5 y recall   Hypertension  bp is stable today  No cp or palpitations or headaches or edema  No side effects to medicines  BP Readings from Last 3 Encounters:  08/31/19 118/64  09/07/18 (!) 133/57  08/18/18 (!) 113/47     Lab Results  Component Value Date   CREATININE 0.75 08/24/2019   BUN 14 08/24/2019   NA 141 08/24/2019   K 3.9 08/24/2019   CL 104 08/24/2019   CO2 31 08/24/2019   Lab Results  Component Value Date   ALT 20 08/24/2019   AST 20 08/24/2019   ALKPHOS 68 08/24/2019   BILITOT 0.4 08/24/2019   H/o anemia and MGUS with neutropenia and thrombocytopenia  Lab Results  Component Value Date   WBC 3.3 (L) 08/24/2019   HGB 11.6 (L) 08/24/2019   HCT 35.0 (L) 08/24/2019   MCV 96.1 08/24/2019   PLT 111.0 (L) 08/24/2019   sees hematology Went in march   Prediabetes Lab Results  Component Value Date   HGBA1C 6.6 (H) 08/24/2019  up from 6.2  Eating differently - with covid and fatigue    Lab Results  Component Value Date   TSH 2.44 08/24/2019    Hyperlipidemia Lab Results  Component Value Date   CHOL 133 08/24/2019   CHOL 154 02/16/2018   CHOL 142 02/05/2017   Lab Results  Component Value Date   HDL 45.30 08/24/2019   HDL 65.30 02/16/2018   HDL 43.70 02/05/2017   Lab Results  Component Value Date   LDLCALC 75 08/24/2019   LDLCALC 80 02/16/2018   LDLCALC 85 02/05/2017   Lab Results  Component Value Date   TRIG 64.0 08/24/2019   TRIG 45.0 02/16/2018   TRIG 65.0 02/05/2017   Lab Results  Component Value Date   CHOLHDL 3 08/24/2019   CHOLHDL 2 02/16/2018   CHOLHDL 3 02/05/2017   Lab Results  Component Value Date   LDLDIRECT 156.0 04/21/2012   LDLDIRECT 138.9 10/22/2010   LDLDIRECT 163.0 10/20/2009  atorvastatin  No fatty foods/greasy things   Patient Active Problem List   Diagnosis Date Noted  . Myelodysplastic syndrome, unspecified ([place]) 08/31/2019  . Estrogen deficiency 08/31/2019  . Diverticulitis 04/27/2018  . Constipation 03/18/2018  . MGUS (monoclonal gammopathy of unknown significance) 09/17/2017  . Prediabetes 10/21/2016  . Somnolence, daytime 04/11/2016  . Fatigue 04/05/2016  . Internal hemorrhoid 04/20/2014  . Screening mammogram, encounter for 03/01/2014  . Uterine  prolapse 03/01/2014  . Stress reaction 09/01/2012  . Hyperlipidemia 04/28/2012  . Routine general medical examination at a health care facility 04/20/2012  . Obesity 02/05/2012  . Other neutropenia ([place]) 10/23/2010  . Thrombocytopenia ([place]) 10/24/2009  . POSTMENOPAUSAL STATUS 08/09/2008  . Depression with anxiety 08/05/2007  . Essential hypertension 08/05/2007  . GERD 04/20/2007  . DIVERTICULAR DISEASE 04/20/2007  . History of colonic polyps 04/13/2007   Past Medical History:  Diagnosis Date  . Adenomatous polyp of colon 03/2007  . Anemia    s/p GI bleed, hospital 5/08  . Anxiety   . Depression   . Diverticulosis   . Endometriosis    uterine US 06/2003  . GERD (gastroesophageal reflux disease)   . Hiatal hernia    EGD 5/08- stricture, HH, gastritis, GERD  .  Hyperlipidemia   . Hypertension   . Hypertension   . Iron deficiency 03/16/2014   Past Surgical History:  Procedure Laterality Date  . cardiac CT  9/12   normal   . HAMMER TOE SURGERY Left 12/02/2017  . KNEE SURGERY Left    Cyst behind left knee.  . TUBAL LIGATION     Social History   Tobacco Use  . Smoking status: Never Smoker  . Smokeless tobacco: Never Used  Substance Use Topics  . Alcohol use: No    Alcohol/week: 0.0 standard drinks  . Drug use: No   Family History  Problem Relation Age of Onset  . Cancer Father        unknown type  . Hypertension Mother   . Colon cancer Neg Hx   . Esophageal cancer Neg Hx   . Rectal cancer Neg Hx   . Stomach cancer Neg Hx    No Known Allergies Current Outpatient Medications on File Prior to Visit  Medication Sig Dispense Refill  . cholecalciferol (VITAMIN D) 1000 units tablet Take 5,000 Units by mouth daily.    . Esomeprazole Magnesium (NEXIUM 24HR PO) Take 20 mg by mouth daily.     . Multiple Vitamins-Minerals (CENTRUM PO) Take 1 tablet by mouth 2 (two) times a week. Take one by mouth daily    . [DISCONTINUED] ranitidine (ZANTAC) 150 MG capsule Take 1 capsule (150 mg total) by mouth 2 (two) times daily. For 3 months 60 capsule 2   No current facility-administered medications on file prior to visit.     Review of Systems  Constitutional: Positive for fatigue. Negative for activity change, appetite change, fever and unexpected weight change.  HENT: Negative for congestion, ear pain, rhinorrhea, sinus pressure and sore throat.   Eyes: Negative for pain, redness and visual disturbance.  Respiratory: Negative for cough, shortness of breath and wheezing.   Cardiovascular: Negative for chest pain and palpitations.  Gastrointestinal: Negative for abdominal pain, blood in stool, constipation and diarrhea.  Endocrine: Negative for polydipsia and polyuria.  Genitourinary: Negative for dysuria, frequency and urgency.  Musculoskeletal:  Negative for arthralgias, back pain and myalgias.  Skin: Negative for pallor and rash.  Allergic/Immunologic: Negative for environmental allergies.  Neurological: Negative for dizziness, syncope and headaches.  Hematological: Negative for adenopathy. Does not bruise/bleed easily.  Psychiatric/Behavioral: Negative for decreased concentration and dysphoric mood. The patient is not nervous/anxious.        Objective:   Physical Exam Constitutional:      General: She is not in acute distress.    Appearance: Normal appearance. She is well-developed and normal weight. She is not ill-appearing or diaphoretic.  HENT:  Head: Normocephalic and atraumatic.     Right Ear: Tympanic membrane, ear canal and external ear normal.     Left Ear: Tympanic membrane, ear canal and external ear normal.     Nose: Nose normal. No congestion.     Mouth/Throat:     Mouth: Mucous membranes are moist.     Pharynx: Oropharynx is clear. No posterior oropharyngeal erythema.  Eyes:     General: No scleral icterus.    Extraocular Movements: Extraocular movements intact.     Conjunctiva/sclera: Conjunctivae normal.     Pupils: Pupils are equal, round, and reactive to light.  Neck:     Musculoskeletal: Normal range of motion and neck supple. No neck rigidity or muscular tenderness.     Thyroid: No thyromegaly.     Vascular: No carotid bruit or JVD.  Cardiovascular:     Rate and Rhythm: Normal rate and regular rhythm.     Pulses: Normal pulses.     Heart sounds: Normal heart sounds. No gallop.   Pulmonary:     Effort: Pulmonary effort is normal. No respiratory distress.     Breath sounds: Normal breath sounds. No wheezing.     Comments: Good air exch Chest:     Chest wall: No tenderness.  Abdominal:     General: Bowel sounds are normal. There is no distension or abdominal bruit.     Palpations: Abdomen is soft. There is no mass.     Tenderness: There is no abdominal tenderness.     Hernia: No hernia is  present.  Genitourinary:    Comments: Breast exam: No mass, nodules, thickening, tenderness, bulging, retraction, inflamation, nipple discharge or skin changes noted.  No axillary or clavicular LA.     Musculoskeletal: Normal range of motion.        General: No tenderness.     Right lower leg: No edema.     Left lower leg: No edema.  Lymphadenopathy:     Cervical: No cervical adenopathy.  Skin:    General: Skin is warm and dry.     Coloration: Skin is not pale.     Findings: No erythema or rash.     Comments: Some skin tags   Neurological:     Mental Status: She is alert. Mental status is at baseline.     Cranial Nerves: No cranial nerve deficit.     Motor: No weakness or abnormal muscle tone.     Coordination: Coordination normal.     Gait: Gait normal.     Deep Tendon Reflexes: Reflexes are normal and symmetric. Reflexes normal.  Psychiatric:        Mood and Affect: Mood normal.        Cognition and Memory: Cognition and memory normal.     Comments: Tearful when discussing her post viral fatigue            Assessment & Plan:   Problem List Items Addressed This Visit      Cardiovascular and Mediastinum   Essential hypertension    bp in fair control at this time  BP Readings from Last 1 Encounters:  08/31/19 118/64   No changes needed Most recent labs reviewed  Disc lifstyle change with low sodium diet and exercise        Relevant Medications   atorvastatin (LIPITOR) 10 MG tablet   hydrochlorothiazide (HYDRODIURIL) 25 MG tablet     Other   Thrombocytopenia ([place])    Sees hematology  Fairly stable Myelodysplastic with  mgus      Depression with anxiety    Stable Continues zoloft       Relevant Medications   sertraline (ZOLOFT) 50 MG tablet   Other neutropenia ([facility])    Myelodysplastic syndrome/ MGUS Under care of hematology Stable cbc No clinical changes      Routine general medical examination at a health care facility - Primary    Reviewed  health habits including diet and exercise and skin cancer prevention Reviewed appropriate screening tests for age  Also reviewed health mt list, fam hx and immunization status , as well as social and family history   See HPI Labs reviewed amw reviewed  Flu shot given  Mammogram appt made  dexa appt made        Relevant Orders   Flu Vaccine QUAD High Dose(Fluad) (Completed)   Hyperlipidemia    Disc goals for lipids and reasons to control them Rev last labs with pt Rev low sat fat diet in detail Controlled with statin and diet      Relevant Medications   atorvastatin (LIPITOR) 10 MG tablet   hydrochlorothiazide (HYDRODIURIL) 25 MG tablet   Screening mammogram, encounter for    Scheduled annual screening mammogram Nl breast exam today  Encouraged monthly self exams        Relevant Orders   MM 3D SCREEN BREAST BILATERAL   Fatigue    Worse s/p covid Other symptoms resolved inst to gradually increase activity as tolerated      Prediabetes    Lab Results  Component Value Date   HGBA1C 6.6 (H) 08/24/2019   This is up-in low DM range Long disc re: diet disc imp of low glycemic diet and wt loss to prevent DM2  Handouts given  F/u 16mo      Myelodysplastic syndrome, unspecified ([place])    Continues tx with hematology (MGUS) Stable cbc      Estrogen deficiency   Relevant Orders   DG Bone Density    Other Visit Diagnoses    Need for influenza vaccination       Relevant Orders   Flu Vaccine QUAD High Dose(Fluad) (Completed)

## 2019-08-31 NOTE — Assessment & Plan Note (Signed)
Stable Continues zoloft

## 2019-08-31 NOTE — Assessment & Plan Note (Signed)
Disc goals for lipids and reasons to control them Rev last labs with pt Rev low sat fat diet in detail Controlled with statin and diet  

## 2019-08-31 NOTE — Assessment & Plan Note (Signed)
Sees hematology  Fairly stable Myelodysplastic with mgus

## 2019-08-31 NOTE — Assessment & Plan Note (Signed)
Lab Results  Component Value Date   HGBA1C 6.6 (H) 08/24/2019   This is up-in low DM range Long disc re: diet disc imp of low glycemic diet and wt loss to prevent DM2  Handouts given  F/u 10mo

## 2019-08-31 NOTE — Assessment & Plan Note (Signed)
Scheduled annual screening mammogram Nl breast exam today  Encouraged monthly self exams   

## 2019-08-31 NOTE — Assessment & Plan Note (Signed)
Worse s/p covid Other symptoms resolved inst to gradually increase activity as tolerated

## 2019-08-31 NOTE — Assessment & Plan Note (Signed)
Myelodysplastic syndrome/ MGUS Under care of hematology Stable cbc No clinical changes

## 2019-08-31 NOTE — Assessment & Plan Note (Signed)
Reviewed health habits including diet and exercise and skin cancer prevention Reviewed appropriate screening tests for age  Also reviewed health mt list, fam hx and immunization status , as well as social and family history   See HPI Labs reviewed amw reviewed  Flu shot given  Mammogram appt made  dexa appt made

## 2019-10-22 IMAGING — MG DIGITAL SCREENING BILATERAL MAMMOGRAM WITH TOMO AND CAD
8 series · 8 of 24 positions shown · non-contrast
Comparison: Previous exam(s).

CLINICAL DATA: Screening.

EXAM:
DIGITAL SCREENING BILATERAL MAMMOGRAM WITH TOMO AND CAD

[R MLO synth-2D]
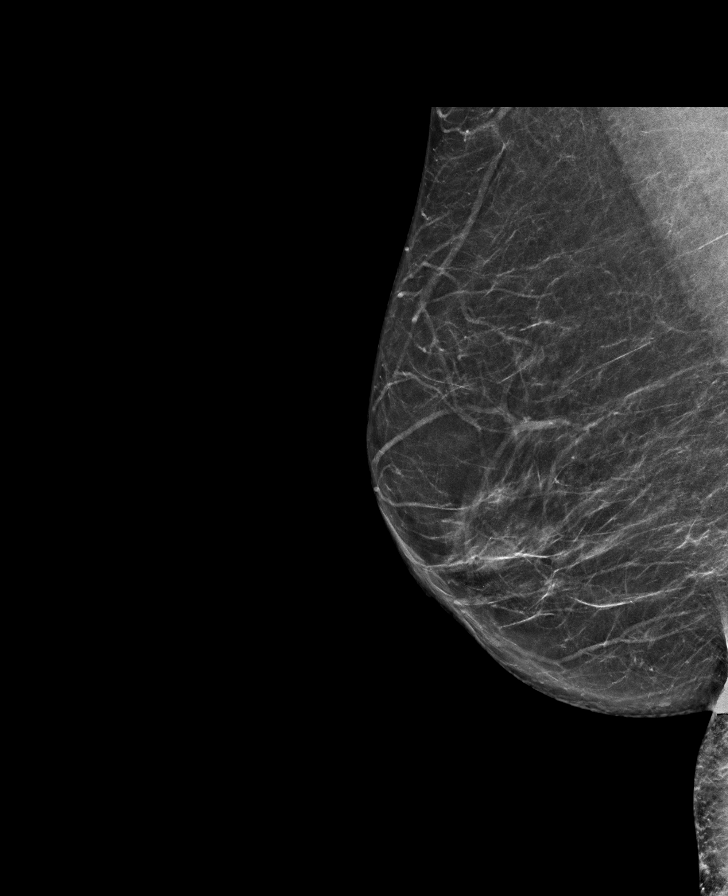

[L MLO synth-2D]
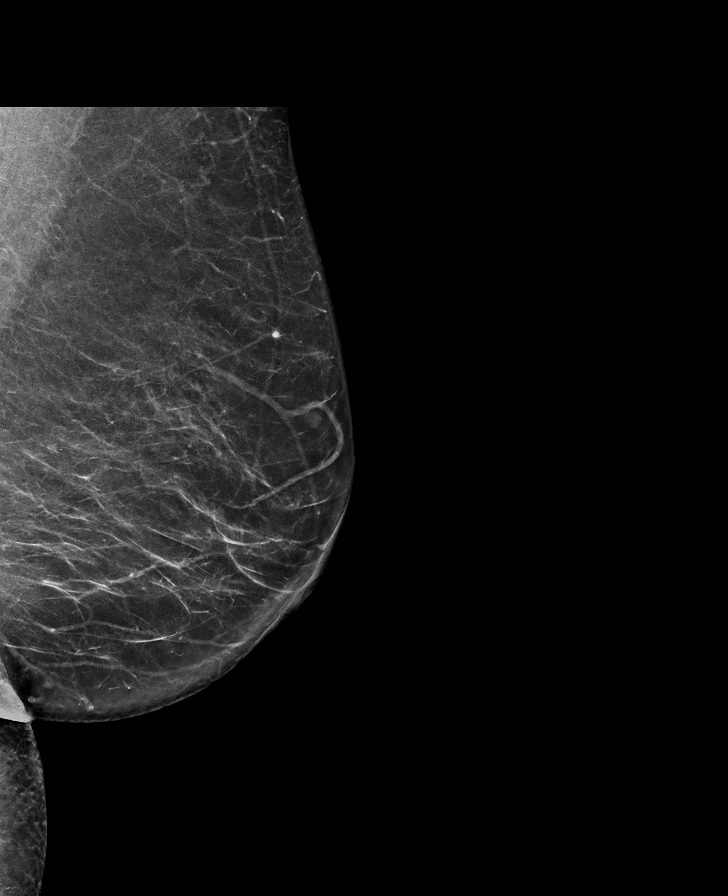

[R CC synth-2D]
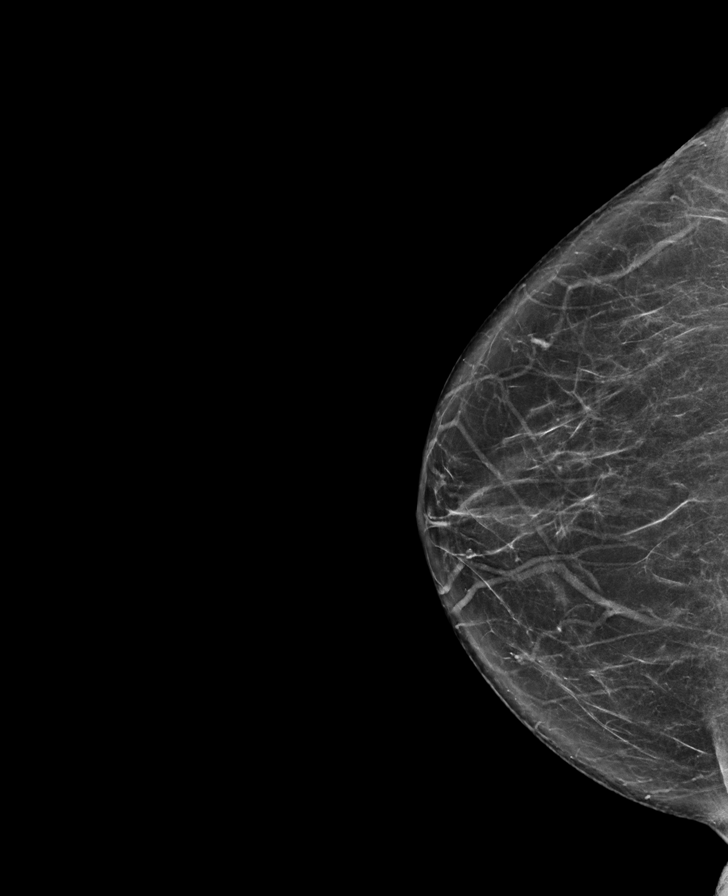

[L CC synth-2D]
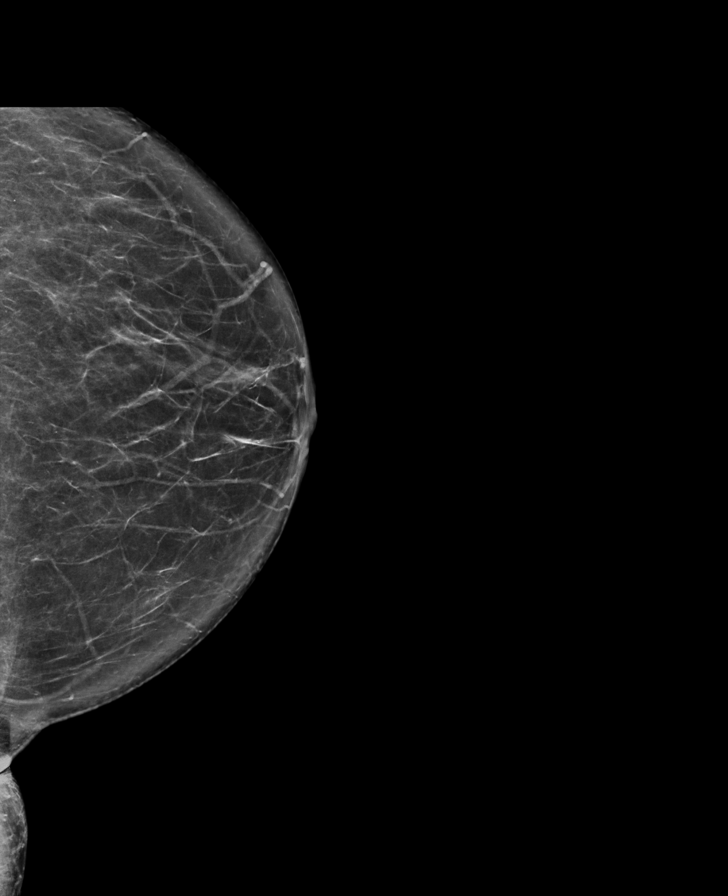

[L MLO tomo · tomo slice 38/75.0]
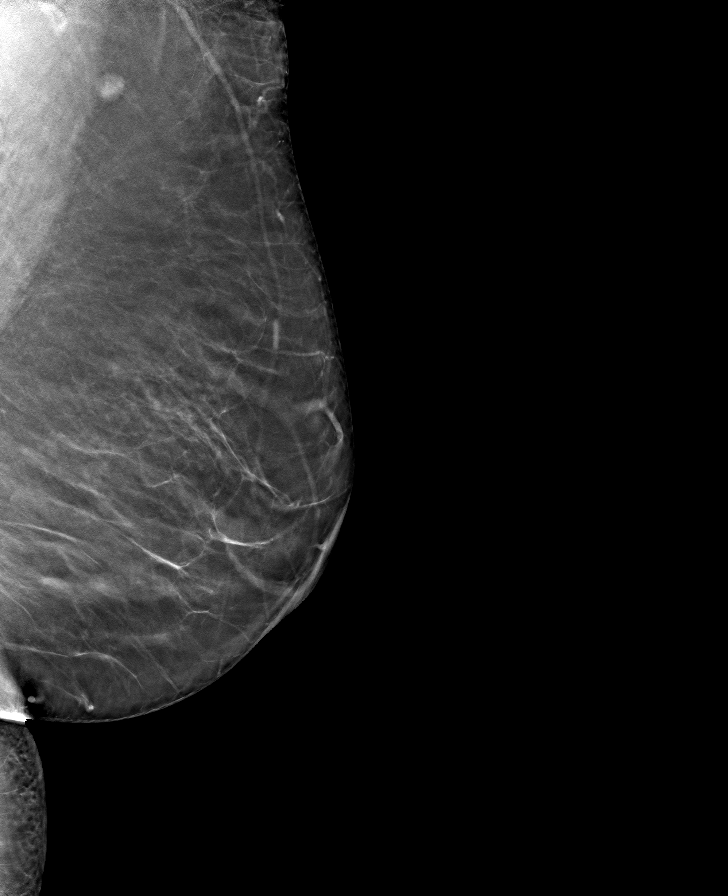

[L CC tomo · tomo slice 37/74.0]
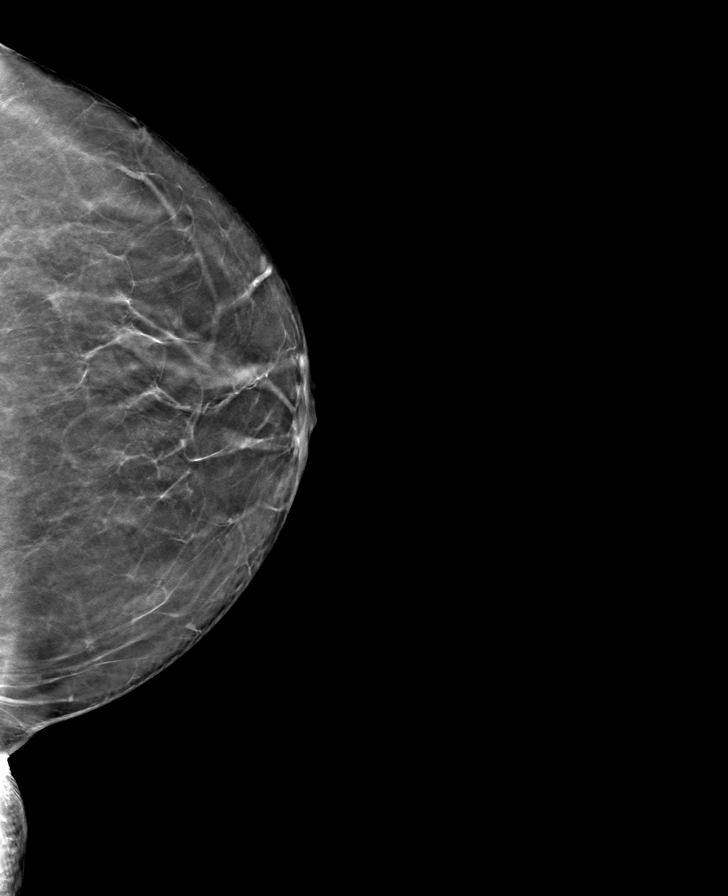

[R MLO tomo · tomo slice 35/70.0]
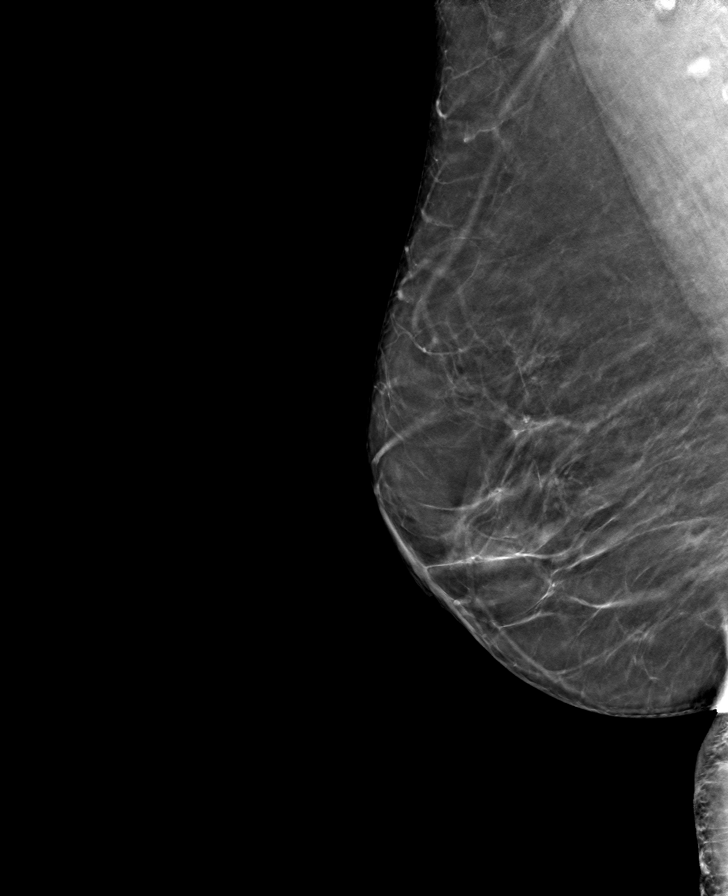

[R CC tomo · tomo slice 39/76.0]
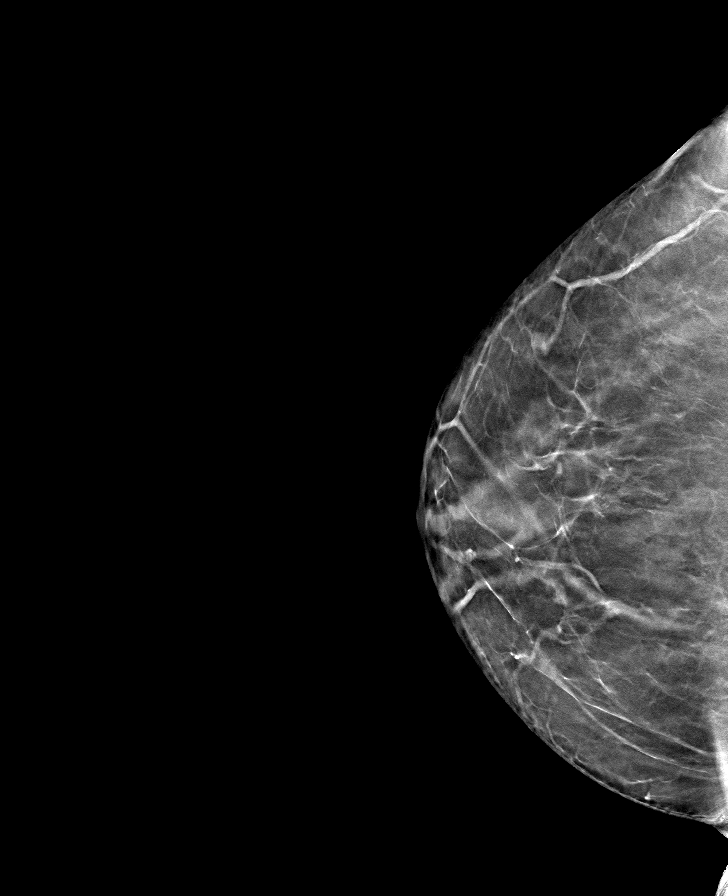

[8 of 24 positions shown; findings below may reference images not displayed]

ACR Breast Density Category b: There are scattered areas of
fibroglandular density.
FINDINGS: There are no findings suspicious for malignancy. Images were
processed with CAD.
IMPRESSION: No mammographic evidence of malignancy. A result letter of this
screening mammogram will be mailed directly to the patient.

RECOMMENDATION:
Screening mammogram in one year. (Code:[id number])

BI-RADS CATEGORY  1: Negative.

## 2019-11-30 ENCOUNTER — Other Ambulatory Visit: Payer: Self-pay

## 2019-11-30 ENCOUNTER — Ambulatory Visit (INDEPENDENT_AMBULATORY_CARE_PROVIDER_SITE_OTHER): Payer: Medicare PPO | Admitting: Family Medicine

## 2019-11-30 ENCOUNTER — Encounter: Payer: Self-pay | Admitting: Family Medicine

## 2019-11-30 VITALS — BP 136/80 | HR 54 | Temp 96.9°F | Ht 62.5 in | Wt 163.6 lb

## 2019-11-30 DIAGNOSIS — D708 Other neutropenia: Secondary | ICD-10-CM | POA: Diagnosis not present

## 2019-11-30 DIAGNOSIS — E7800 Pure hypercholesterolemia, unspecified: Secondary | ICD-10-CM

## 2019-11-30 DIAGNOSIS — R7303 Prediabetes: Secondary | ICD-10-CM

## 2019-11-30 DIAGNOSIS — D469 Myelodysplastic syndrome, unspecified: Secondary | ICD-10-CM

## 2019-11-30 DIAGNOSIS — E6609 Other obesity due to excess calories: Secondary | ICD-10-CM | POA: Diagnosis not present

## 2019-11-30 DIAGNOSIS — Z6830 Body mass index (BMI) 30.0-30.9, adult: Secondary | ICD-10-CM

## 2019-11-30 LAB — POCT GLYCOSYLATED HEMOGLOBIN (HGB A1C): Hemoglobin A1C: 5.8 % — AB (ref 4.0–5.6)

## 2019-11-30 NOTE — Assessment & Plan Note (Signed)
Good control with statin and diet  Disc goals for lipids and reasons to control them Rev last labs with pt Rev low sat fat diet in detail Disc cutting bacon and replacing with a lower fat protein

## 2019-11-30 NOTE — Assessment & Plan Note (Signed)
Lab Results  Component Value Date   HGBA1C 5.8 (A) 11/30/2019   Significant improvement  Pt has worked to cut processed carbs - commended Next recommendation is exercise-work up to 30 min per day (inside until weather permits being outdoors and walking)

## 2019-11-30 NOTE — Progress Notes (Signed)
Subjective:    Patient ID: Kathryn Weaver, female    DOB: 07-May-1946, 74 y.o.   MRN: [id number]  This visit occurred during the SARS-CoV-2 public health emergency.  Safety protocols were in place, including screening questions prior to the visit, additional usage of staff PPE, and extensive cleaning of exam room while observing appropriate contact time as indicated for disinfecting solutions.    HPI Pt presents for f/u of chronic health problems   Wt Readings from Last 3 Encounters:  11/30/19 163 lb 9 oz (74.2 kg)  08/31/19 160 lb 6 oz (72.7 kg)  09/07/18 162 lb 6.4 oz (73.7 kg)  wt is up a few lb -poss from the holidays   (but she did not eat a lot) and not a lot of sweets   29.44 kg/m    bp is stable today  No cp or palpitations or headaches or edema  No side effects to medicines  BP Readings from Last 3 Encounters:  11/30/19 136/80  08/31/19 118/64  09/07/18 (!) 133/57     Prediabetes Last visit A1C went up significantly  Lab Results  Component Value Date   HGBA1C 6.6 (H) 08/24/2019   Since then  She stopped potatoes  She cut out spaghetti - (cut out pasta)  She eats occ brown bread  Oatmeal for breakfast   Lab Results  Component Value Date   HGBA1C 5.8 (A) 11/30/2019     Eats a few cookies occasionally   She was eating differently with illness/covid and general fatigue and stress   Taking atorvastatin for cholesterol  Lab Results  Component Value Date   CHOL 133 08/24/2019   HDL 45.30 08/24/2019   LDLCALC 75 08/24/2019   LDLDIRECT 156.0 04/21/2012   TRIG 64.0 08/24/2019   CHOLHDL 3 08/24/2019  bacon occasionally  Diet   She walks to the mailbox for exercise  - not very long  Keeps her 2 grandchildren - active day  67 and 45 years old- in and out -watching them /plays games outdoors  No scheduled exercise   Mammogram is scheduled for 1/22 dexa also scheduled for that day  Continues to see hematology for myelodysplastic syndrome Lab Results   Component Value Date   WBC 3.3 (L) 08/24/2019   HGB 11.6 (L) 08/24/2019   HCT 35.0 (L) 08/24/2019   MCV 96.1 08/24/2019   PLT 111.0 (L) 08/24/2019   Patient Active Problem List   Diagnosis Date Noted  . Myelodysplastic syndrome, unspecified ([place]) 08/31/2019  . Estrogen deficiency 08/31/2019  . Diverticulitis 04/27/2018  . Constipation 03/18/2018  . MGUS (monoclonal gammopathy of unknown significance) 09/17/2017  . Prediabetes 10/21/2016  . Somnolence, daytime 04/11/2016  . Fatigue 04/05/2016  . Internal hemorrhoid 04/20/2014  . Screening mammogram, encounter for 03/01/2014  . Uterine prolapse 03/01/2014  . Stress reaction 09/01/2012  . Hyperlipidemia 04/28/2012  . Routine general medical examination at a health care facility 04/20/2012  . Obesity 02/05/2012  . Other neutropenia ([place]) 10/23/2010  . Thrombocytopenia ([place]) 10/24/2009  . POSTMENOPAUSAL STATUS 08/09/2008  . Depression with anxiety 08/05/2007  . Essential hypertension 08/05/2007  . GERD 04/20/2007  . DIVERTICULAR DISEASE 04/20/2007  . History of colonic polyps 04/13/2007   Past Medical History:  Diagnosis Date  . Adenomatous polyp of colon 03/2007  . Anemia    s/p GI bleed, hospital 5/08  . Anxiety   . Depression   . Diverticulosis   . Endometriosis    uterine US 06/2003  . GERD (gastroesophageal  reflux disease)   . Hiatal hernia    EGD 5/08- stricture, HH, gastritis, GERD  . Hyperlipidemia   . Hypertension   . Hypertension   . Iron deficiency 03/16/2014   Past Surgical History:  Procedure Laterality Date  . cardiac CT  9/12   normal   . HAMMER TOE SURGERY Left 12/02/2017  . KNEE SURGERY Left    Cyst behind left knee.  . TUBAL LIGATION     Social History   Tobacco Use  . Smoking status: Never Smoker  . Smokeless tobacco: Never Used  Substance Use Topics  . Alcohol use: No    Alcohol/week: 0.0 standard drinks  . Drug use: No   Family History  Problem Relation Age of Onset  . Cancer  Father        unknown type  . Hypertension Mother   . Colon cancer Neg Hx   . Esophageal cancer Neg Hx   . Rectal cancer Neg Hx   . Stomach cancer Neg Hx    No Known Allergies Current Outpatient Medications on File Prior to Visit  Medication Sig Dispense Refill  . atorvastatin (LIPITOR) 10 MG tablet Take 1 tablet (10 mg total) by mouth daily. 90 tablet 3  . cholecalciferol (VITAMIN D) 1000 units tablet Take 5,000 Units by mouth daily.    . Esomeprazole Magnesium (NEXIUM 24HR PO) Take 20 mg by mouth daily.     . hydrochlorothiazide (HYDRODIURIL) 25 MG tablet Take 1 tablet (25 mg total) by mouth daily. 90 tablet 3  . Multiple Vitamins-Minerals (CENTRUM PO) Take 1 tablet by mouth 2 (two) times a week. Take one by mouth daily    . sertraline (ZOLOFT) 50 MG tablet TAKE 1 TABLET BY MOUTH EVERY DAY 90 tablet 3  . [DISCONTINUED] ranitidine (ZANTAC) 150 MG capsule Take 1 capsule (150 mg total) by mouth 2 (two) times daily. For 3 months 60 capsule 2   No current facility-administered medications on file prior to visit.    Review of Systems  Constitutional: Negative for activity change, appetite change, fatigue, fever and unexpected weight change.  HENT: Negative for congestion, ear pain, rhinorrhea, sinus pressure and sore throat.   Eyes: Negative for pain, redness and visual disturbance.  Respiratory: Negative for cough, shortness of breath and wheezing.   Cardiovascular: Negative for chest pain and palpitations.  Gastrointestinal: Negative for abdominal pain, blood in stool, constipation and diarrhea.  Endocrine: Negative for polydipsia and polyuria.  Genitourinary: Negative for dysuria, frequency and urgency.  Musculoskeletal: Negative for arthralgias, back pain and myalgias.  Skin: Negative for pallor and rash.  Allergic/Immunologic: Negative for environmental allergies.  Neurological: Negative for dizziness, syncope and headaches.  Hematological: Negative for adenopathy. Does not  bruise/bleed easily.  Psychiatric/Behavioral: Negative for decreased concentration and dysphoric mood. The patient is not nervous/anxious.        Objective:   Physical Exam Constitutional:      General: She is not in acute distress.    Appearance: Normal appearance. She is well-developed. She is obese. She is not ill-appearing or diaphoretic.  HENT:     Head: Normocephalic and atraumatic.  Eyes:     Conjunctiva/sclera: Conjunctivae normal.     Pupils: Pupils are equal, round, and reactive to light.  Neck:     Thyroid: No thyromegaly.     Vascular: No carotid bruit or JVD.  Cardiovascular:     Rate and Rhythm: Normal rate and regular rhythm.     Heart sounds: Normal heart sounds.  No gallop.   Pulmonary:     Effort: Pulmonary effort is normal. No respiratory distress.     Breath sounds: Normal breath sounds. No wheezing or rales.  Abdominal:     General: Abdomen is flat. There is no abdominal bruit.  Musculoskeletal:     Cervical back: Normal range of motion and neck supple.  Lymphadenopathy:     Cervical: No cervical adenopathy.  Skin:    General: Skin is warm and dry.     Findings: No erythema or rash.  Neurological:     Mental Status: She is alert.     Deep Tendon Reflexes: Reflexes are normal and symmetric.  Psychiatric:        Mood and Affect: Mood normal.           Assessment & Plan:   Problem List Items Addressed This Visit      Other   Other neutropenia ([place])    Under care of hematology No clinical changes       Obesity    Enc wt loss for DM prev   Discussed how this problem influences overall health and the risks it imposes  Reviewed plan for weight loss with lower calorie diet (via better food choices and also portion control or program like weight watchers) and exercise building up to or more than 30 minutes 5 days per week including some aerobic activity         Hyperlipidemia    Good control with statin and diet  Disc goals for lipids and  reasons to control them Rev last labs with pt Rev low sat fat diet in detail Disc cutting bacon and replacing with a lower fat protein       Prediabetes - Primary    Lab Results  Component Value Date   HGBA1C 5.8 (A) 11/30/2019   Significant improvement  Pt has worked to cut processed carbs - commended Next recommendation is exercise-work up to 30 min per day (inside until weather permits being outdoors and walking)        Relevant Orders   POCT glycosylated hemoglobin (Hb A1C) (Completed)   Myelodysplastic syndrome, unspecified ([place])    Under care of hematology

## 2019-11-30 NOTE — Assessment & Plan Note (Signed)
Under care of hematology No clinical changes

## 2019-11-30 NOTE — Patient Instructions (Addendum)
For blood sugar control/ weight loss and general health  Try to get most of your carbohydrates from produce (with the exception of white potatoes)  Eat less bread/pasta/rice/snack foods/cereals/sweets and other items from the middle of the grocery store (processed carbs)  I want to aim for/ work up to 30 or more minutes of exercise daily  (extra)  Videos are great- you can buy DVDs or stream them  Chair yoga if very popular, there is walking (Eric Form does those videos)   Your A1C came down from 6.6 to 5.8 today  The cut off for diabetes is 6.5 so you are out of the range  Keep up the good work cutting the carbs  Work on weight loss  Add exercise when you can

## 2019-11-30 NOTE — Assessment & Plan Note (Signed)
Enc wt loss for DM prev   Discussed how this problem influences overall health and the risks it imposes  Reviewed plan for weight loss with lower calorie diet (via better food choices and also portion control or program like weight watchers) and exercise building up to or more than 30 minutes 5 days per week including some aerobic activity

## 2019-11-30 NOTE — Assessment & Plan Note (Signed)
Under care of hematology

## 2019-12-03 ENCOUNTER — Ambulatory Visit: Payer: Medicare Other

## 2019-12-03 ENCOUNTER — Other Ambulatory Visit: Payer: Medicare Other

## 2020-01-10 ENCOUNTER — Ambulatory Visit
Admission: RE | Admit: 2020-01-10 | Discharge: 2020-01-10 | Disposition: A | Discharge status: Home / Self Care | Payer: Medicare PPO | Source: Ambulatory Visit | Attending: Family Medicine | Admitting: Family Medicine

## 2020-01-10 ENCOUNTER — Other Ambulatory Visit: Payer: Self-pay

## 2020-01-10 DIAGNOSIS — Z1231 Encounter for screening mammogram for malignant neoplasm of breast: Secondary | ICD-10-CM

## 2020-02-01 ENCOUNTER — Other Ambulatory Visit: Payer: Self-pay

## 2020-02-01 ENCOUNTER — Ambulatory Visit
Admission: RE | Admit: 2020-02-01 | Discharge: 2020-02-01 | Disposition: A | Discharge status: Home / Self Care | Payer: Medicare PPO | Source: Ambulatory Visit | Attending: Family Medicine | Admitting: Family Medicine

## 2020-02-01 DIAGNOSIS — E2839 Other primary ovarian failure: Secondary | ICD-10-CM

## 2020-02-01 DIAGNOSIS — M85852 Other specified disorders of bone density and structure, left thigh: Secondary | ICD-10-CM | POA: Diagnosis not present

## 2020-02-01 DIAGNOSIS — Z780 Asymptomatic menopausal state: Secondary | ICD-10-CM | POA: Diagnosis not present

## 2020-02-04 ENCOUNTER — Telehealth: Payer: Self-pay

## 2020-02-04 NOTE — Telephone Encounter (Signed)
Patient called stating she was told to take Calcium and Vitamin D daily but did not know what dose to take. Patient has been taking Vitamin D3 125 mg daily. Please advise.

## 2020-02-04 NOTE — Telephone Encounter (Signed)
Pt notified and verbalized understanding.

## 2020-02-04 NOTE — Telephone Encounter (Signed)
600 to 1200 mg of calcium daily divided into 2 doses with  at least 1000 iu of vitamin D

## 2020-03-01 DIAGNOSIS — Z8249 Family history of ischemic heart disease and other diseases of the circulatory system: Secondary | ICD-10-CM | POA: Diagnosis not present

## 2020-03-01 DIAGNOSIS — K219 Gastro-esophageal reflux disease without esophagitis: Secondary | ICD-10-CM | POA: Diagnosis not present

## 2020-03-01 DIAGNOSIS — M1990 Unspecified osteoarthritis, unspecified site: Secondary | ICD-10-CM | POA: Diagnosis not present

## 2020-03-01 DIAGNOSIS — E785 Hyperlipidemia, unspecified: Secondary | ICD-10-CM | POA: Diagnosis not present

## 2020-03-01 DIAGNOSIS — Z6829 Body mass index (BMI) 29.0-29.9, adult: Secondary | ICD-10-CM | POA: Diagnosis not present

## 2020-03-01 DIAGNOSIS — I10 Essential (primary) hypertension: Secondary | ICD-10-CM | POA: Diagnosis not present

## 2020-03-01 DIAGNOSIS — I739 Peripheral vascular disease, unspecified: Secondary | ICD-10-CM | POA: Diagnosis not present

## 2020-03-01 DIAGNOSIS — E663 Overweight: Secondary | ICD-10-CM | POA: Diagnosis not present

## 2020-05-11 DIAGNOSIS — H1045 Other chronic allergic conjunctivitis: Secondary | ICD-10-CM | POA: Diagnosis not present

## 2020-05-22 ENCOUNTER — Telehealth: Payer: Self-pay | Admitting: *Deleted

## 2020-05-22 NOTE — Telephone Encounter (Signed)
Dr. Glori Bickers received a test result from [facility] checking blood volum/flo in her hands and feet. Per Dr. Glori Bickers Kathryn Weaver needs an appt to discuss these results with her.   Called Kathryn Weaver and no answer and no VM (kept ringing)

## 2020-05-23 NOTE — Telephone Encounter (Signed)
appt scheduled for tomorrow

## 2020-05-24 ENCOUNTER — Other Ambulatory Visit: Payer: Self-pay

## 2020-05-24 ENCOUNTER — Encounter: Payer: Self-pay | Admitting: Family Medicine

## 2020-05-24 ENCOUNTER — Ambulatory Visit: Payer: Medicare PPO | Admitting: Family Medicine

## 2020-05-24 VITALS — BP 110/70 | HR 59 | Temp 97.0°F | Ht 62.5 in | Wt 171.6 lb

## 2020-05-24 DIAGNOSIS — R0989 Other specified symptoms and signs involving the circulatory and respiratory systems: Secondary | ICD-10-CM | POA: Diagnosis not present

## 2020-05-24 DIAGNOSIS — Z6830 Body mass index (BMI) 30.0-30.9, adult: Secondary | ICD-10-CM

## 2020-05-24 DIAGNOSIS — I10 Essential (primary) hypertension: Secondary | ICD-10-CM

## 2020-05-24 DIAGNOSIS — E6609 Other obesity due to excess calories: Secondary | ICD-10-CM

## 2020-05-24 NOTE — Assessment & Plan Note (Signed)
Pt had abn quantiflo result at home screen  More difficult to palpate pedal pulse in L foot No claudication  Good bp and lipid control  Enc strongly to walk more for exercise-she agreed   Rev last ekg ABI study ordered for both LEs

## 2020-05-24 NOTE — Assessment & Plan Note (Signed)
Discussed how this problem influences overall health and the risks it imposes  Reviewed plan for weight loss with lower calorie diet (via better food choices and also portion control or program like weight watchers) and exercise building up to or more than 30 minutes 5 days per week including some aerobic activity    

## 2020-05-24 NOTE — Patient Instructions (Addendum)
We want to watch your closely for PAD (that is clogged blood vessels and reduced flow to feet)   Your exam is good and I can feel pulses in feet  Also -you are not having symptoms   I would like to get an ABI test eventually (no rush)  I will work on that and the office will call you No rush   If you develop lower leg pain when you walk that improves with rest let me know   Your BP and cholesterol are controlled I want you to start a walking program

## 2020-05-24 NOTE — Progress Notes (Signed)
Subjective:    Patient ID: Early Chars, female    DOB: July 04, 1946, 74 y.o.   MRN: [id number]  This visit occurred during the SARS-CoV-2 public health emergency.  Safety protocols were in place, including screening questions prior to the visit, additional usage of staff PPE, and extensive cleaning of exam room while observing appropriate contact time as indicated for disinfecting solutions.    HPI Pt presents for f/u of abn quanta flo screening test   Wt Readings from Last 3 Encounters:  05/24/20 171 lb 9 oz (77.8 kg)  11/30/19 163 lb 9 oz (74.2 kg)  08/31/19 160 lb 6 oz (72.7 kg)   30.88 kg/m   This found moderate changes in RLE and more significant changes in LLE  According to the forms "correspondence" to dz is 30-50% for mod changes and 51-75% for significant   Pt has h/o HTN  bp is stable today  No cp or palpitations or headaches or edema  No side effects to medicines  BP Readings from Last 3 Encounters:  05/24/20 110/70  11/30/19 136/80  08/31/19 118/64    Well controlled  Also h/o thrombocytopenia /neutropenia from MDS  Hyperlipidemia  Lab Results  Component Value Date   CHOL 133 08/24/2019   HDL 45.30 08/24/2019   LDLCALC 75 08/24/2019   LDLDIRECT 156.0 04/21/2012   TRIG 64.0 08/24/2019   CHOLHDL 3 08/24/2019   Takes atorvastatin and well controlled   Last EKG-NSR in 2019   No claudication symptoms at all  Feet do not turn blue or purple or cold  occ tingling in bottom of feet   She is covid immunized   Wants to stay in [place] if any tests  Patient Active Problem List   Diagnosis Date Noted  . Diminished pulses in lower extremity 05/24/2020  . Myelodysplastic syndrome, unspecified ([place]) 08/31/2019  . Estrogen deficiency 08/31/2019  . Diverticulitis 04/27/2018  . Constipation 03/18/2018  . MGUS (monoclonal gammopathy of unknown significance) 09/17/2017  . Prediabetes 10/21/2016  . Somnolence, daytime 04/11/2016  . Fatigue 04/05/2016  .  Internal hemorrhoid 04/20/2014  . Screening mammogram, encounter for 03/01/2014  . Uterine prolapse 03/01/2014  . Stress reaction 09/01/2012  . Hyperlipidemia 04/28/2012  . Routine general medical examination at a health care facility 04/20/2012  . Obesity 02/05/2012  . Other neutropenia ([place]) 10/23/2010  . Thrombocytopenia ([place]) 10/24/2009  . POSTMENOPAUSAL STATUS 08/09/2008  . Depression with anxiety 08/05/2007  . Essential hypertension 08/05/2007  . GERD 04/20/2007  . DIVERTICULAR DISEASE 04/20/2007  . History of colonic polyps 04/13/2007   Past Medical History:  Diagnosis Date  . Adenomatous polyp of colon 03/2007  . Anemia    s/p GI bleed, hospital 5/08  . Anxiety   . Depression   . Diverticulosis   . Endometriosis    uterine US 06/2003  . GERD (gastroesophageal reflux disease)   . Hiatal hernia    EGD 5/08- stricture, HH, gastritis, GERD  . Hyperlipidemia   . Hypertension   . Hypertension   . Iron deficiency 03/16/2014   Past Surgical History:  Procedure Laterality Date  . cardiac CT  9/12   normal   . HAMMER TOE SURGERY Left 12/02/2017  . KNEE SURGERY Left    Cyst behind left knee.  . TUBAL LIGATION     Social History   Tobacco Use  . Smoking status: Never Smoker  . Smokeless tobacco: Never Used  Vaping Use  . Vaping Use: Never used  Substance Use Topics  .  Alcohol use: No    Alcohol/week: 0.0 standard drinks  . Drug use: No   Family History  Problem Relation Age of Onset  . Cancer Father        unknown type  . Hypertension Mother   . Colon cancer Neg Hx   . Esophageal cancer Neg Hx   . Rectal cancer Neg Hx   . Stomach cancer Neg Hx    No Known Allergies Current Outpatient Medications on File Prior to Visit  Medication Sig Dispense Refill  . atorvastatin (LIPITOR) 10 MG tablet Take 1 tablet (10 mg total) by mouth daily. 90 tablet 3  . cholecalciferol (VITAMIN D) 1000 units tablet Take 5,000 Units by mouth daily.    . Esomeprazole Magnesium  (NEXIUM 24HR PO) Take 20 mg by mouth daily.     . hydrochlorothiazide (HYDRODIURIL) 25 MG tablet Take 1 tablet (25 mg total) by mouth daily. 90 tablet 3  . Multiple Vitamins-Minerals (CENTRUM PO) Take 1 tablet by mouth 2 (two) times a week. Take one by mouth daily    . sertraline (ZOLOFT) 50 MG tablet TAKE 1 TABLET BY MOUTH EVERY DAY 90 tablet 3  . [DISCONTINUED] ranitidine (ZANTAC) 150 MG capsule Take 1 capsule (150 mg total) by mouth 2 (two) times daily. For 3 months 60 capsule 2   No current facility-administered medications on file prior to visit.    Review of Systems  Constitutional: Negative for activity change, appetite change, fatigue, fever and unexpected weight change.  HENT: Negative for congestion, ear pain, rhinorrhea, sinus pressure and sore throat.   Eyes: Negative for pain, redness and visual disturbance.  Respiratory: Negative for cough, shortness of breath and wheezing.   Cardiovascular: Negative for chest pain and palpitations.  Gastrointestinal: Negative for abdominal pain, blood in stool, constipation and diarrhea.  Endocrine: Negative for polydipsia and polyuria.  Genitourinary: Negative for dysuria, frequency and urgency.  Musculoskeletal: Negative for arthralgias, back pain and myalgias.  Skin: Negative for pallor and rash.  Allergic/Immunologic: Negative for environmental allergies.  Neurological: Negative for dizziness, syncope and headaches.  Hematological: Negative for adenopathy. Does not bruise/bleed easily.  Psychiatric/Behavioral: Negative for decreased concentration and dysphoric mood. The patient is not nervous/anxious.        Objective:   Physical Exam Constitutional:      General: She is not in acute distress.    Appearance: Normal appearance. She is obese. She is not ill-appearing.  Eyes:     General: No scleral icterus.    Conjunctiva/sclera: Conjunctivae normal.     Pupils: Pupils are equal, round, and reactive to light.  Neck:     Vascular:  No carotid bruit.     Comments: Pedal pulses are difficult to palpate in L foot (but able)  Better in R foot  Feet are warm and feel generally well perfused Cardiovascular:     Rate and Rhythm: Regular rhythm. Bradycardia present.     Heart sounds: Normal heart sounds.  Musculoskeletal:        General: No tenderness.     Cervical back: Neck supple.     Right lower leg: No edema.     Left lower leg: No edema.  Lymphadenopathy:     Cervical: No cervical adenopathy.  Skin:    Coloration: Skin is not pale.     Findings: No erythema or rash.  Neurological:     Mental Status: She is alert.     Sensory: No sensory deficit.  Psychiatric:  Mood and Affect: Mood normal.           Assessment & Plan:   Problem List Items Addressed This Visit      Cardiovascular and Mediastinum   Essential hypertension    bp in fair control at this time  BP Readings from Last 1 Encounters:  05/24/20 110/70   No changes needed Most recent labs reviewed  Disc lifstyle change with low sodium diet and exercise          Other   Obesity    Discussed how this problem influences overall health and the risks it imposes  Reviewed plan for weight loss with lower calorie diet (via better food choices and also portion control or program like weight watchers) and exercise building up to or more than 30 minutes 5 days per week including some aerobic activity         Diminished pulses in lower extremity - Primary    Pt had abn quantiflo result at home screen  More difficult to palpate pedal pulse in L foot No claudication  Good bp and lipid control  Enc strongly to walk more for exercise-she agreed   Rev last ekg ABI study ordered for both LEs      Relevant Orders   VAS [place] LOWER EXT ART SEG MULTI (SEGMENTALS & LE RAYNAUDS)

## 2020-05-24 NOTE — Assessment & Plan Note (Signed)
bp in fair control at this time  BP Readings from Last 1 Encounters:  05/24/20 110/70   No changes needed Most recent labs reviewed  Disc lifstyle change with low sodium diet and exercise

## 2020-06-29 ENCOUNTER — Encounter (HOSPITAL_COMMUNITY): Payer: Medicare PPO

## 2020-07-05 ENCOUNTER — Other Ambulatory Visit: Payer: Self-pay

## 2020-07-05 ENCOUNTER — Ambulatory Visit (HOSPITAL_COMMUNITY)
Admission: RE | Admit: 2020-07-05 | Discharge: 2020-07-05 | Disposition: A | Discharge status: Home / Self Care | Payer: Medicare PPO | Source: Ambulatory Visit | Attending: Internal Medicine | Admitting: Internal Medicine

## 2020-07-05 DIAGNOSIS — R0989 Other specified symptoms and signs involving the circulatory and respiratory systems: Secondary | ICD-10-CM

## 2020-07-06 ENCOUNTER — Telehealth: Payer: Self-pay | Admitting: *Deleted

## 2020-07-06 NOTE — Telephone Encounter (Signed)
Left VM requesting pt to call the office back regarding imaging results

## 2020-08-25 ENCOUNTER — Other Ambulatory Visit: Payer: Self-pay | Admitting: Family Medicine

## 2020-08-26 ENCOUNTER — Ambulatory Visit: Payer: Medicare PPO | Attending: Internal Medicine

## 2020-08-26 ENCOUNTER — Other Ambulatory Visit: Payer: Self-pay

## 2020-08-26 DIAGNOSIS — Z23 Encounter for immunization: Secondary | ICD-10-CM

## 2020-08-26 NOTE — Progress Notes (Signed)
   Covid-19 Vaccination [facility]  Name:  Kathryn Weaver    MRN: [id number] DOB: [birth year]  08/26/2020  [name] was observed post Covid-19 immunization for 15 minutes without incident. She was provided with Vaccine Information Sheet and instruction to access the V-Safe system.   [name] was instructed to call 911 with any severe reactions post vaccine: Marland Kitchen Difficulty breathing  . Swelling of face and throat  . A fast heartbeat  . A bad rash all over body  . Dizziness and weakness

## 2020-08-28 NOTE — Telephone Encounter (Signed)
Please schedule PE and refill until then  

## 2020-08-28 NOTE — Telephone Encounter (Signed)
Pt had a f/u on some imaging results in July 2021 but it's been over a year since pt's had a lipid lab done and no future appts. Please advise

## 2020-08-29 NOTE — Telephone Encounter (Signed)
Med refilled once and [facility] will reach out to pt to try and get CPE scheduled

## 2020-09-09 ENCOUNTER — Other Ambulatory Visit: Payer: Self-pay | Admitting: Family Medicine

## 2020-09-11 NOTE — Telephone Encounter (Signed)
Pharmacy requests refill on: HCTZ 25mg  1 daily   LAST REFILL: #90, 0refills on 08/29/2020 LAST OV: over 1 year ago needs CPE NEXT OV:  11/01/2020 PHARMACY:  Pharmacy requests refill on: Atorvastatin 10mg  daily   LAST REFILL: #90, 0 refills on 08/29/2020  Patient has enough medication to last until CPE in December where Dr. Glori Bickers can renew medications for year at appointment.   Will deny current refill requests.

## 2020-10-24 ENCOUNTER — Telehealth: Payer: Self-pay | Admitting: Family Medicine

## 2020-10-24 DIAGNOSIS — R7303 Prediabetes: Secondary | ICD-10-CM

## 2020-10-24 DIAGNOSIS — D696 Thrombocytopenia, unspecified: Secondary | ICD-10-CM

## 2020-10-24 DIAGNOSIS — I10 Essential (primary) hypertension: Secondary | ICD-10-CM

## 2020-10-24 DIAGNOSIS — E7800 Pure hypercholesterolemia, unspecified: Secondary | ICD-10-CM

## 2020-10-24 DIAGNOSIS — Z0000 Encounter for general adult medical examination without abnormal findings: Secondary | ICD-10-CM

## 2020-10-24 NOTE — Telephone Encounter (Signed)
-----   Message from Cloyd Stagers, RT sent at 10/11/2020  1:57 PM EST ----- Regarding: Lab Orders for Wednesday 12.15.2021 Please place lab orders for Wednesday 12.15.2021,office visit for physical on Wednesday 12.22.2021 Thank you, Dyke Maes RT(R)

## 2020-10-25 ENCOUNTER — Other Ambulatory Visit: Payer: Self-pay

## 2020-10-25 ENCOUNTER — Ambulatory Visit (INDEPENDENT_AMBULATORY_CARE_PROVIDER_SITE_OTHER): Payer: Medicare PPO

## 2020-10-25 ENCOUNTER — Other Ambulatory Visit (INDEPENDENT_AMBULATORY_CARE_PROVIDER_SITE_OTHER): Payer: Medicare PPO

## 2020-10-25 DIAGNOSIS — I10 Essential (primary) hypertension: Secondary | ICD-10-CM

## 2020-10-25 DIAGNOSIS — E7800 Pure hypercholesterolemia, unspecified: Secondary | ICD-10-CM

## 2020-10-25 DIAGNOSIS — Z0000 Encounter for general adult medical examination without abnormal findings: Secondary | ICD-10-CM

## 2020-10-25 DIAGNOSIS — R7303 Prediabetes: Secondary | ICD-10-CM

## 2020-10-25 DIAGNOSIS — D696 Thrombocytopenia, unspecified: Secondary | ICD-10-CM

## 2020-10-25 LAB — COMPREHENSIVE METABOLIC PANEL
ALT: 29 U/L (ref 0–35)
AST: 23 U/L (ref 0–37)
Albumin: 3.7 g/dL (ref 3.5–5.2)
Alkaline Phosphatase: 85 U/L (ref 39–117)
BUN: 17 mg/dL (ref 6–23)
CO2: 32 mEq/L (ref 19–32)
Calcium: 10.5 mg/dL (ref 8.4–10.5)
Chloride: 104 mEq/L (ref 96–112)
Creatinine, Ser: 0.79 mg/dL (ref 0.40–1.20)
GFR: 74.2 mL/min (ref >60.00)
Glucose, Bld: 100 mg/dL — ABNORMAL HIGH (ref 70–99)
Potassium: 4.4 mEq/L (ref 3.5–5.1)
Sodium: 140 mEq/L (ref 135–145)
Total Bilirubin: 0.5 mg/dL (ref 0.2–1.2)
Total Protein: 7.5 g/dL (ref 6.0–8.3)

## 2020-10-25 LAB — HEMOGLOBIN A1C: Hgb A1c MFr Bld: 6.1 % (ref 4.6–6.5)

## 2020-10-25 LAB — CBC WITH DIFFERENTIAL/PLATELET
Basophils Absolute: 0 10*3/uL (ref 0.0–0.1)
Basophils Relative: 0.5 % (ref 0.0–3.0)
Eosinophils Absolute: 0.1 10*3/uL (ref 0.0–0.7)
Eosinophils Relative: 3.1 % (ref 0.0–5.0)
HCT: 36.8 % (ref 36.0–46.0)
Hemoglobin: 12.3 g/dL (ref 12.0–15.0)
Lymphocytes Relative: 43.7 % (ref 12.0–46.0)
Lymphs Abs: 1.3 10*3/uL (ref 0.7–4.0)
MCHC: 33.3 g/dL (ref 30.0–36.0)
MCV: 94.4 fl (ref 78.0–100.0)
Monocytes Absolute: 0.5 10*3/uL (ref 0.1–1.0)
Monocytes Relative: 17.3 % — ABNORMAL HIGH (ref 3.0–12.0)
Neutro Abs: 1 10*3/uL — ABNORMAL LOW (ref 1.4–7.7)
Neutrophils Relative %: 35.4 % — ABNORMAL LOW (ref 43.0–77.0)
Platelets: 100 10*3/uL — ABNORMAL LOW (ref 150.0–400.0)
RBC: 3.9 Mil/uL (ref 3.87–5.11)
RDW: 13.5 % (ref 11.5–15.5)
WBC: 2.9 10*3/uL — ABNORMAL LOW (ref 4.0–10.5)

## 2020-10-25 LAB — LIPID PANEL
Cholesterol: 156 mg/dL (ref 0–200)
HDL: 61.4 mg/dL (ref >39.00)
LDL Cholesterol: 85 mg/dL (ref 0–99)
NonHDL: 94.77
Total CHOL/HDL Ratio: 3
Triglycerides: 47 mg/dL (ref 0.0–149.0)
VLDL: 9.4 mg/dL (ref 0.0–40.0)

## 2020-10-25 LAB — TSH: TSH: 2.47 u[IU]/mL (ref 0.35–4.50)

## 2020-10-25 NOTE — Progress Notes (Signed)
PCP notes:  Health Maintenance: Flu- declined    Abnormal Screenings: none   Patient concerns: none   Nurse concerns: none   Next PCP appt: 11/01/2020 @ 8:30 am

## 2020-10-25 NOTE — Patient Instructions (Signed)
Kathryn Weaver , Thank you for taking time to come for your Medicare Wellness Visit. I appreciate your ongoing commitment to your health goals. Please review the following plan we discussed and let me know if I can assist you in the future.   Screening recommendations/referrals: Colonoscopy: Up to date, completed 08/28/2018, due 08/2023 Mammogram: Up to date, completed 01/10/2020, due 01/2021 Bone Density: Up to date, completed 02/01/2020, due 01/2022 Recommended yearly ophthalmology/optometry visit for glaucoma screening and checkup Recommended yearly dental visit for hygiene and checkup  Vaccinations: Influenza vaccine: declined Pneumococcal vaccine: Completed series Tdap vaccine: decline-insurance Shingles vaccine: due, check with your insurance regarding coverage if interested    Covid-19:Completed series  Advanced directives: Advance directive discussed with you today. Even though you declined this today please call our office should you change your mind and we can give you the proper paperwork for you to fill out.  Conditions/risks identified: hypertension, hyperlipidemia  Next appointment: Follow up in one year for your annual wellness visit    Preventive Care 40 Years and Older, Female Preventive care refers to lifestyle choices and visits with your health care provider that can promote health and wellness. What does preventive care include?  A yearly physical exam. This is also called an annual well check.  Dental exams once or twice a year.  Routine eye exams. Ask your health care provider how often you should have your eyes checked.  Personal lifestyle choices, including:  Daily care of your teeth and gums.  Regular physical activity.  Eating a healthy diet.  Avoiding tobacco and drug use.  Limiting alcohol use.  Practicing safe sex.  Taking low-dose aspirin every day.  Taking vitamin and mineral supplements as recommended by your health care provider. What happens  during an annual well check? The services and screenings done by your health care provider during your annual well check will depend on your age, overall health, lifestyle risk factors, and family history of disease. Counseling  Your health care provider may ask you questions about your:  Alcohol use.  Tobacco use.  Drug use.  Emotional well-being.  Home and relationship well-being.  Sexual activity.  Eating habits.  History of falls.  Memory and ability to understand (cognition).  Work and work Statistician.  Reproductive health. Screening  You may have the following tests or measurements:  Height, weight, and BMI.  Blood pressure.  Lipid and cholesterol levels. These may be checked every 5 years, or more frequently if you are over 41 years old.  Skin check.  Lung cancer screening. You may have this screening every year starting at age 66 if you have a 30-pack-year history of smoking and currently smoke or have quit within the past 15 years.  Fecal occult blood test (FOBT) of the stool. You may have this test every year starting at age 34.  Flexible sigmoidoscopy or colonoscopy. You may have a sigmoidoscopy every 5 years or a colonoscopy every 10 years starting at age 13.  Hepatitis C blood test.  Hepatitis B blood test.  Sexually transmitted disease (STD) testing.  Diabetes screening. This is done by checking your blood sugar (glucose) after you have not eaten for a while (fasting). You may have this done every 1-3 years.  Bone density scan. This is done to screen for osteoporosis. You may have this done starting at age 13.  Mammogram. This may be done every 1-2 years. Talk to your health care provider about how often you should have regular mammograms. Talk  with your health care provider about your test results, treatment options, and if necessary, the need for more tests. Vaccines  Your health care provider may recommend certain vaccines, such  as:  Influenza vaccine. This is recommended every year.  Tetanus, diphtheria, and acellular pertussis (Tdap, Td) vaccine. You may need a Td booster every 10 years.  Zoster vaccine. You may need this after age 4.  Pneumococcal 13-valent conjugate (PCV13) vaccine. One dose is recommended after age 15.  Pneumococcal polysaccharide (PPSV23) vaccine. One dose is recommended after age 51. Talk to your health care provider about which screenings and vaccines you need and how often you need them. This information is not intended to replace advice given to you by your health care provider. Make sure you discuss any questions you have with your health care provider. Document Released: 11/24/2015 Document Revised: 07/17/2016 Document Reviewed: 08/29/2015 Elsevier Interactive Patient Education  2017 [place] Prevention in the Home Falls can cause injuries. They can happen to people of all ages. There are many things you can do to make your home safe and to help prevent falls. What can I do on the outside of my home?  Regularly fix the edges of walkways and driveways and fix any cracks.  Remove anything that might make you trip as you walk through a door, such as a raised step or threshold.  Trim any bushes or trees on the path to your home.  Use bright outdoor lighting.  Clear any walking paths of anything that might make someone trip, such as rocks or tools.  Regularly check to see if handrails are loose or broken. Make sure that both sides of any steps have handrails.  Any raised decks and porches should have guardrails on the edges.  Have any leaves, snow, or ice cleared regularly.  Use sand or salt on walking paths during winter.  Clean up any spills in your garage right away. This includes oil or grease spills. What can I do in the bathroom?  Use night lights.  Install grab bars by the toilet and in the tub and shower. Do not use towel bars as grab bars.  Use  non-skid mats or decals in the tub or shower.  If you need to sit down in the shower, use a plastic, non-slip stool.  Keep the floor dry. Clean up any water that spills on the floor as soon as it happens.  Remove soap buildup in the tub or shower regularly.  Attach bath mats securely with double-sided non-slip rug tape.  Do not have throw rugs and other things on the floor that can make you trip. What can I do in the bedroom?  Use night lights.  Make sure that you have a light by your bed that is easy to reach.  Do not use any sheets or blankets that are too big for your bed. They should not hang down onto the floor.  Have a firm chair that has side arms. You can use this for support while you get dressed.  Do not have throw rugs and other things on the floor that can make you trip. What can I do in the kitchen?  Clean up any spills right away.  Avoid walking on wet floors.  Keep items that you use a lot in easy-to-reach places.  If you need to reach something above you, use a strong step stool that has a grab bar.  Keep electrical cords out of the way.  Do  not use floor polish or wax that makes floors slippery. If you must use wax, use non-skid floor wax.  Do not have throw rugs and other things on the floor that can make you trip. What can I do with my stairs?  Do not leave any items on the stairs.  Make sure that there are handrails on both sides of the stairs and use them. Fix handrails that are broken or loose. Make sure that handrails are as long as the stairways.  Check any carpeting to make sure that it is firmly attached to the stairs. Fix any carpet that is loose or worn.  Avoid having throw rugs at the top or bottom of the stairs. If you do have throw rugs, attach them to the floor with carpet tape.  Make sure that you have a light switch at the top of the stairs and the bottom of the stairs. If you do not have them, ask someone to add them for you. What  else can I do to help prevent falls?  Wear shoes that:  Do not have high heels.  Have rubber bottoms.  Are comfortable and fit you well.  Are closed at the toe. Do not wear sandals.  If you use a stepladder:  Make sure that it is fully opened. Do not climb a closed stepladder.  Make sure that both sides of the stepladder are locked into place.  Ask someone to hold it for you, if possible.  Clearly mark and make sure that you can see:  Any grab bars or handrails.  First and last steps.  Where the edge of each step is.  Use tools that help you move around (mobility aids) if they are needed. These include:  Canes.  Walkers.  Scooters.  Crutches.  Turn on the lights when you go into a dark area. Replace any light bulbs as soon as they burn out.  Set up your furniture so you have a clear path. Avoid moving your furniture around.  If any of your floors are uneven, fix them.  If there are any pets around you, be aware of where they are.  Review your medicines with your doctor. Some medicines can make you feel dizzy. This can increase your chance of falling. Ask your doctor what other things that you can do to help prevent falls. This information is not intended to replace advice given to you by your health care provider. Make sure you discuss any questions you have with your health care provider. Document Released: 08/24/2009 Document Revised: 04/04/2016 Document Reviewed: 12/02/2014 Elsevier Interactive Patient Education  2017 [employer].

## 2020-10-25 NOTE — Progress Notes (Signed)
Subjective:   Kathryn Weaver is a 74 y.o. female who presents for Medicare Annual (Subsequent) preventive examination.  Review of Systems: N/A      I connected with the patient today by telephone and verified that I am speaking with the correct person using two identifiers. Location patient: home Location nurse: work Persons participating in the telephone visit: patient, nurse.   I discussed the limitations, risks, security and privacy concerns of performing an evaluation and management service by telephone and the availability of in person appointments. I also discussed with the patient that there may be a patient responsible charge related to this service. The patient expressed understanding and verbally consented to this telephonic visit.        Cardiac Risk Factors include: advanced age (>89men, >64 women);hypertension;Other (see comment), Risk factor comments: hyperlipidemia     Objective:    Today's Vitals   There is no height or weight on file to calculate BMI.  Advanced Directives 10/25/2020 08/24/2019 09/07/2018 04/01/2018 01/28/2018 09/17/2017 06/13/2017  Does Patient Have a Medical Advance Directive? No No No No Yes No No  Type of Advance Directive - - - - [place] in Chart? - - - - No - copy requested - -  Would patient like information on creating a medical advance directive? No - Patient declined No - Patient declined No - Patient declined No - Patient declined - No - Patient declined No - Patient declined    Current Medications (verified) Outpatient Encounter Medications as of 10/25/2020  Medication Sig  . atorvastatin (LIPITOR) 10 MG tablet TAKE 1 TABLET BY MOUTH EVERY DAY  . cholecalciferol (VITAMIN D) 1000 units tablet Take 5,000 Units by mouth daily.  . Esomeprazole Magnesium (NEXIUM 24HR PO) Take 20 mg by mouth daily.   . hydrochlorothiazide (HYDRODIURIL) 25 MG tablet TAKE 1 TABLET BY MOUTH EVERY DAY   . Multiple Vitamins-Minerals (CENTRUM PO) Take 1 tablet by mouth 2 (two) times a week. Take one by mouth daily  . sertraline (ZOLOFT) 50 MG tablet TAKE 1 TABLET BY MOUTH EVERY DAY  . [DISCONTINUED] ranitidine (ZANTAC) 150 MG capsule Take 1 capsule (150 mg total) by mouth 2 (two) times daily. For 3 months   No facility-administered encounter medications on file as of 10/25/2020.    Allergies (verified) Patient has no known allergies.   History: Past Medical History:  Diagnosis Date  . Adenomatous polyp of colon 03/2007  . Anemia    s/p GI bleed, hospital 5/08  . Anxiety   . Depression   . Diverticulosis   . Endometriosis    uterine US 06/2003  . GERD (gastroesophageal reflux disease)   . Hiatal hernia    EGD 5/08- stricture, HH, gastritis, GERD  . Hyperlipidemia   . Hypertension   . Hypertension   . Iron deficiency 03/16/2014   Past Surgical History:  Procedure Laterality Date  . cardiac CT  9/12   normal   . HAMMER TOE SURGERY Left 12/02/2017  . KNEE SURGERY Left    Cyst behind left knee.  . TUBAL LIGATION     Family History  Problem Relation Age of Onset  . Cancer Father        unknown type  . Hypertension Mother   . Colon cancer Neg Hx   . Esophageal cancer Neg Hx   . Rectal cancer Neg Hx   . Stomach cancer Neg Hx    Social  History   Socioeconomic History  . Marital status: Married    Spouse name: Not on file  . Number of children: Not on file  . Years of education: Not on file  . Highest education level: Not on file  Occupational History  . Occupation: [employer]  Tobacco Use  . Smoking status: Never Smoker  . Smokeless tobacco: Never Used  Vaping Use  . Vaping Use: Never used  Substance and Sexual Activity  . Alcohol use: No    Alcohol/week: 0.0 standard drinks  . Drug use: No  . Sexual activity: Yes  Other Topics Concern  . Not on file  Social History Narrative  . Not on file   Social Determinants of Health   Financial  Resource Strain: Low Risk   . Difficulty of Paying Living Expenses: Not hard at all  Food Insecurity: No Food Insecurity  . Worried About Charity fundraiser in the Last Year: Never true  . Ran Out of Food in the Last Year: Never true  Transportation Needs: No Transportation Needs  . Lack of Transportation (Medical): No  . Lack of Transportation (Non-Medical): No  Physical Activity: Inactive  . Days of Exercise per Week: 0 days  . Minutes of Exercise per Session: 0 min  Stress: No Stress Concern Present  . Feeling of Stress : Not at all  Social Connections: Not on file    Tobacco Counseling Counseling given: Not Answered   Clinical Intake:  Pre-visit preparation completed: Yes  Pain : No/denies pain     Nutritional Risks: None Diabetes: No  How often do you need to have someone help you when you read instructions, pamphlets, or other written materials from your doctor or pharmacy?: 1 - Never What is the last grade level you completed in school?: 12th  Diabetic: No Nutrition Risk Assessment:  Has the patient had any N/V/D within the last 2 months?  No  Does the patient have any non-healing wounds?  No  Has the patient had any unintentional weight loss or weight gain?  No   Diabetes:  Is the patient diabetic?  No  If diabetic, was a CBG obtained today?  N/A Did the patient bring in their glucometer from home?  N/A How often do you monitor your CBG's? N/A.   Financial Strains and Diabetes Management:  Are you having any financial strains with the device, your supplies or your medication? N/A.  Does the patient want to be seen by Chronic Care Management for management of their diabetes?  N/A Would the patient like to be referred to a Nutritionist or for Diabetic Management?  N/A    Interpreter Needed?: No  Information entered by :: CJohnson, LPN   Activities of Daily Living In your present state of health, do you have any difficulty performing the following  activities: 10/25/2020  Hearing? N  Vision? N  Difficulty concentrating or making decisions? N  Walking or climbing stairs? N  Dressing or bathing? N  Doing errands, shopping? N  Preparing Food and eating ? N  Using the Toilet? N  In the past six months, have you accidently leaked urine? N  Do you have problems with loss of bowel control? N  Managing your Medications? N  Managing your Finances? N  Housekeeping or managing your Housekeeping? N  Some recent data might be hidden    Patient Care Team: Tower, [name], MD as PCP - General Josue Hector, MD as PCP - Cardiology (Cardiology)  Indicate any recent Medical Services you may have received from other than [facility] providers in the past year (date may be approximate).     Assessment:   This is a routine wellness examination for Chiyeko.  Hearing/Vision screen  Hearing Screening   125Hz  250Hz  500Hz  1000Hz  2000Hz  3000Hz  4000Hz  6000Hz  8000Hz   Right ear:           Left ear:           Vision Screening Comments: Patient gets annual eye exams   Dietary issues and exercise activities discussed: Current Exercise Habits: The patient does not participate in regular exercise at present, Exercise limited by: None identified  Goals    . DIET - EAT MORE FRUITS AND VEGETABLES     Starting 01/28/2018, I will continue to eat 4-5 servings of fruits and vegetables.    . Increase physical activity     Starting 1/11/16/16, I will continue to walk at least 30 min 1-2 days per week.     . Patient Stated     08/24/2019, I will maintain my weight where it is.     . Patient Stated     10/25/2020, I will maintain and continue medications as prescribed.       Depression Screen PHQ 2/9 Scores 10/25/2020 11/30/2019 08/24/2019 01/28/2018 11/26/2016 10/18/2015 03/01/2014  PHQ - 2 Score 0 0 0 0 0 0 0  PHQ- 9 Score 0 - 0 0 - - -    Fall Risk Fall Risk  10/25/2020 08/24/2019 01/28/2018 11/26/2016 10/18/2015  Falls in the past year? 0 0 No No No  Number  falls in past yr: 0 - - - -  Injury with Fall? 0 - - - -  Risk for fall due to : Medication side effect Medication side effect - - -  Follow up Falls evaluation completed;Falls prevention discussed Falls evaluation completed;Falls prevention discussed - - -    FALL RISK PREVENTION PERTAINING TO THE HOME:  Any stairs in or around the home? Yes  If so, are there any without handrails? No  Home free of loose throw rugs in walkways, pet beds, electrical cords, etc? Yes  Adequate lighting in your home to reduce risk of falls? Yes   ASSISTIVE DEVICES UTILIZED TO PREVENT FALLS:  Life alert? No  Use of a cane, walker or w/c? No  Grab bars in the bathroom? No  Shower chair or bench in shower? No  Elevated toilet seat or a handicapped toilet? No   TIMED UP AND GO:  Was the test performed? N/A, telephone visit .   Cognitive Function: MMSE - Mini Mental State Exam 10/25/2020 08/24/2019 01/28/2018 11/26/2016  Orientation to time 5 5 5 5   Orientation to Place 5 5 5 5   Registration 3 3 3 3   Attention/ Calculation 5 5 0 0  Recall 3 3 3 3   Language- name 2 objects - - 0 0  Language- repeat 1 1 1 1   Language- follow 3 step command - - 3 3  Language- read & follow direction - - 0 0  Write a sentence - - 0 0  Copy design - - 0 0  Total score - - 20 20  Mini Cog  Mini-Cog screen was completed. Maximum score is 22. A value of 0 denotes this part of the MMSE was not completed or the patient failed this part of the Mini-Cog screening.       Immunizations Immunization History  Administered Date(s) Administered  . Fluad  Quad(high Dose 65+) 08/31/2019  . Influenza,inj,Quad PF,6+ Mos 10/18/2015, 08/28/2016, 02/18/2018  . Influenza-Unspecified 09/11/2018  . PFIZER SARS-COV-2 Vaccination 12/15/2019, 01/07/2020, 08/26/2020  . Pneumococcal Conjugate-13 10/18/2015  . Pneumococcal Polysaccharide-23 03/01/2014  . Td 03/26/1996, 08/09/2008    TDAP status: Due, Education has been provided  regarding the importance of this vaccine. Advised may receive this vaccine at local pharmacy or Health Dept. Aware to provide a copy of the vaccination record if obtained from local pharmacy or Health Dept. Verbalized acceptance and understanding.  Flu Vaccine status: Declined, Education has been provided regarding the importance of this vaccine but patient still declined. Advised may receive this vaccine at local pharmacy or Health Dept. Aware to provide a copy of the vaccination record if obtained from local pharmacy or Health Dept. Verbalized acceptance and understanding.  Pneumococcal vaccine status: Up to date  Covid-19 vaccine status: Completed vaccines  Qualifies for Shingles Vaccine? Yes   Zostavax completed No   Shingrix Completed?: No.    Education has been provided regarding the importance of this vaccine. Patient has been advised to call insurance company to determine out of pocket expense if they have not yet received this vaccine. Advised may also receive vaccine at local pharmacy or Health Dept. Verbalized acceptance and understanding.  Screening Tests Health Maintenance  Topic Date Due  . INFLUENZA VACCINE  02/08/2021 (Originally 06/11/2020)  . TETANUS/TDAP  10/25/2024 (Originally 08/09/2018)  . MAMMOGRAM  01/09/2021  . COVID-19 Vaccine (4 - Booster for Pfizer series) 02/24/2021  . COLONOSCOPY  08/19/2023  . DEXA SCAN  Completed  . Hepatitis C Screening  Completed  . PNA vac Low Risk Adult  Completed    Health Maintenance  There are no preventive care reminders to display for this patient.  Colorectal cancer screening: Type of screening: Colonoscopy. Completed 08/18/2018. Repeat every 5 years  Mammogram status: Completed 01/10/2020. Repeat every year  Bone Density status: Completed 02/01/2020. Results reflect: Bone density results: OSTEOPENIA. Repeat every 2 years.  Lung Cancer Screening: (Low Dose CT Chest recommended if Age 36-80 years, 30 pack-year currently smoking OR  have quit w/in 15years.) does not qualify.     Additional Screening:  Hepatitis C Screening: does qualify; Completed 01/10/2014  Vision Screening: Recommended annual ophthalmology exams for early detection of glaucoma and other disorders of the eye. Is the patient up to date with their annual eye exam?  Yes  Who is the provider or what is the name of the office in which the patient attends annual eye exams? Dr. Coralee Pesa If pt is not established with a provider, would they like to be referred to a provider to establish care? No .   Dental Screening: Recommended annual dental exams for proper oral hygiene  Community Resource Referral / Chronic Care Management: CRR required this visit?  No   CCM required this visit?  No      Plan:     I have personally reviewed and noted the following in the patient's chart:   . Medical and social history . Use of alcohol, tobacco or illicit drugs  . Current medications and supplements . Functional ability and status . Nutritional status . Physical activity . Advanced directives . List of other physicians . Hospitalizations, surgeries, and ER visits in previous 12 months . Vitals . Screenings to include cognitive, depression, and falls . Referrals and appointments  In addition, I have reviewed and discussed with patient certain preventive protocols, quality metrics, and best practice recommendations. A written personalized care  plan for preventive services as well as general preventive health recommendations were provided to patient.   Due to this being a telephonic visit, the after visit summary with patients personalized plan was offered to patient via office or my-chart. Patient preferred to pick up at office at next visit via mychart.   Andrez Grime, LPN   [id number]

## 2020-11-01 ENCOUNTER — Ambulatory Visit (INDEPENDENT_AMBULATORY_CARE_PROVIDER_SITE_OTHER): Payer: Medicare PPO | Admitting: Family Medicine

## 2020-11-01 ENCOUNTER — Other Ambulatory Visit: Payer: Self-pay

## 2020-11-01 ENCOUNTER — Encounter: Payer: Self-pay | Admitting: Family Medicine

## 2020-11-01 VITALS — BP 110/66 | HR 52 | Temp 96.9°F | Ht 62.5 in | Wt 177.3 lb

## 2020-11-01 DIAGNOSIS — Z6830 Body mass index (BMI) 30.0-30.9, adult: Secondary | ICD-10-CM

## 2020-11-01 DIAGNOSIS — D469 Myelodysplastic syndrome, unspecified: Secondary | ICD-10-CM | POA: Diagnosis not present

## 2020-11-01 DIAGNOSIS — I10 Essential (primary) hypertension: Secondary | ICD-10-CM | POA: Diagnosis not present

## 2020-11-01 DIAGNOSIS — R7303 Prediabetes: Secondary | ICD-10-CM | POA: Diagnosis not present

## 2020-11-01 DIAGNOSIS — D708 Other neutropenia: Secondary | ICD-10-CM

## 2020-11-01 DIAGNOSIS — F418 Other specified anxiety disorders: Secondary | ICD-10-CM | POA: Diagnosis not present

## 2020-11-01 DIAGNOSIS — D696 Thrombocytopenia, unspecified: Secondary | ICD-10-CM | POA: Diagnosis not present

## 2020-11-01 DIAGNOSIS — Z0000 Encounter for general adult medical examination without abnormal findings: Secondary | ICD-10-CM

## 2020-11-01 DIAGNOSIS — E6609 Other obesity due to excess calories: Secondary | ICD-10-CM | POA: Diagnosis not present

## 2020-11-01 DIAGNOSIS — E7800 Pure hypercholesterolemia, unspecified: Secondary | ICD-10-CM | POA: Diagnosis not present

## 2020-11-01 MED ORDER — HYDROCHLOROTHIAZIDE 25 MG PO TABS: 25.0000 mg | ORAL_TABLET | Freq: Every day | ORAL | 3 refills | Status: DC

## 2020-11-01 MED ORDER — SERTRALINE HCL 50 MG PO TABS: ORAL_TABLET | ORAL | 3 refills | Status: DC

## 2020-11-01 MED ORDER — ATORVASTATIN CALCIUM 10 MG PO TABS: 10.0000 mg | ORAL_TABLET | Freq: Every day | ORAL | 3 refills | Status: DC

## 2020-11-01 NOTE — Patient Instructions (Addendum)
Work on diet and exercise  You are closer to diabetes   No change in medicines   Think about a flu shot this season

## 2020-11-01 NOTE — Assessment & Plan Note (Signed)
Reviewed stressors/ coping techniques/symptoms/ support sources/ tx options and side effects in detail today Low motivation but otherwise doing well  Plan to continue 50 mg sertraline daily

## 2020-11-01 NOTE — Assessment & Plan Note (Signed)
Reviewed health habits including diet and exercise and skin cancer prevention Reviewed appropriate screening tests for age  Also reviewed health mt list, fam hx and immunization status , as well as social and family history   See HPI Labs reviewed  Disc eating habits and need for exercise  amw rev  Declined flu shot  Is covid vaccinated with booster  Enc her to consider shingrix vaccine Rev dexa 3/21  No falls or fx and takes vit D

## 2020-11-01 NOTE — Assessment & Plan Note (Signed)
Discussed how this problem influences overall health and the risks it imposes  Reviewed plan for weight loss with lower calorie diet (via better food choices and also portion control or program like weight watchers) and exercise building up to or more than 30 minutes 5 days per week including some aerobic activity   Unfortunately does not know what will motivate her

## 2020-11-01 NOTE — Assessment & Plan Note (Signed)
bp in fair control at this time  BP Readings from Last 1 Encounters:  11/01/20 110/66   No changes needed Most recent labs reviewed  Disc lifstyle change with low sodium diet and exercise  Plan to continue hctz 25 mg daily

## 2020-11-01 NOTE — Progress Notes (Signed)
Subjective:    Patient ID: Kathryn Weaver, female    DOB: 1946/03/25, 74 y.o.   MRN: [id number]  This visit occurred during the SARS-CoV-2 public health emergency.  Safety protocols were in place, including screening questions prior to the visit, additional usage of staff PPE, and extensive cleaning of exam room while observing appropriate contact time as indicated for disinfecting solutions.    HPI  Here for health maintenance exam and to review chronic medical problems    Wt Readings from Last 3 Encounters:  11/01/20 177 lb 5 oz (80.4 kg)  05/24/20 171 lb 9 oz (77.8 kg)  11/30/19 163 lb 9 oz (74.2 kg)   31.91 kg/m   Doing well  Taking care of herself overall   Feels out of control with eating  ? If emotional eating  No exercise   amw was 12/15  Flu shot declined (no reason why)  covid imm with booster  utd pna  Td 09  Mammogram 3/21 Self breast exam - no lumps   Colonoscopy 2019 with 5 y recall   dexa 3/21-osteopenia  Falls, fractures-none Supplements-vit D  Exercise - none   HTN bp is stable today  No cp or palpitations or headaches or edema  No side effects to medicines  BP Readings from Last 3 Encounters:  11/01/20 110/66  05/24/20 110/70  11/30/19 136/80    Taking hctz 25 mg daily   H/o low wbc and platelets  Lab Results  Component Value Date   WBC 2.9 (L) 10/25/2020   HGB 12.3 10/25/2020   HCT 36.8 10/25/2020   MCV 94.4 10/25/2020   PLT 100.0 (L) 10/25/2020  hematology -will go back if needed  Myelodysplasia and mgus   Mood=doing well with sertraline   Prediabetes Lab Results  Component Value Date   HGBA1C 6.1 10/25/2020  up from 5.8  Hyperlipidemia  Lab Results  Component Value Date   CHOL 156 10/25/2020   CHOL 133 08/24/2019   CHOL 154 02/16/2018   Lab Results  Component Value Date   HDL 61.40 10/25/2020   HDL 45.30 08/24/2019   HDL 65.30 02/16/2018   Lab Results  Component Value Date   LDLCALC 85 10/25/2020   LDLCALC  75 08/24/2019   LDLCALC 80 02/16/2018   Lab Results  Component Value Date   TRIG 47.0 10/25/2020   TRIG 64.0 08/24/2019   TRIG 45.0 02/16/2018   Lab Results  Component Value Date   CHOLHDL 3 10/25/2020   CHOLHDL 3 08/24/2019   CHOLHDL 2 02/16/2018   Lab Results  Component Value Date   LDLDIRECT 156.0 04/21/2012   LDLDIRECT 138.9 10/22/2010   LDLDIRECT 163.0 10/20/2009   Atorvastatin 10 mg daily  Good control  Diet is fair   Patient Active Problem List   Diagnosis Date Noted  . Diminished pulses in lower extremity 05/24/2020  . Myelodysplastic syndrome, unspecified ([place]) 08/31/2019  . Estrogen deficiency 08/31/2019  . Diverticulitis 04/27/2018  . Constipation 03/18/2018  . MGUS (monoclonal gammopathy of unknown significance) 09/17/2017  . Prediabetes 10/21/2016  . Somnolence, daytime 04/11/2016  . Fatigue 04/05/2016  . Internal hemorrhoid 04/20/2014  . Screening mammogram, encounter for 03/01/2014  . Uterine prolapse 03/01/2014  . Stress reaction 09/01/2012  . Hyperlipidemia 04/28/2012  . Routine general medical examination at a health care facility 04/20/2012  . Obesity 02/05/2012  . Other neutropenia ([place]) 10/23/2010  . Thrombocytopenia ([place]) 10/24/2009  . POSTMENOPAUSAL STATUS 08/09/2008  . Depression with anxiety 08/05/2007  .  Essential hypertension 08/05/2007  . GERD 04/20/2007  . DIVERTICULAR DISEASE 04/20/2007  . History of colonic polyps 04/13/2007   Past Medical History:  Diagnosis Date  . Adenomatous polyp of colon 03/2007  . Anemia    s/p GI bleed, hospital 5/08  . Anxiety   . Depression   . Diverticulosis   . Endometriosis    uterine US 06/2003  . GERD (gastroesophageal reflux disease)   . Hiatal hernia    EGD 5/08- stricture, HH, gastritis, GERD  . Hyperlipidemia   . Hypertension   . Hypertension   . Iron deficiency 03/16/2014   Past Surgical History:  Procedure Laterality Date  . cardiac CT  9/12   normal   . HAMMER TOE SURGERY Left  12/02/2017  . KNEE SURGERY Left    Cyst behind left knee.  . TUBAL LIGATION     Social History   Tobacco Use  . Smoking status: Never Smoker  . Smokeless tobacco: Never Used  Vaping Use  . Vaping Use: Never used  Substance Use Topics  . Alcohol use: No    Alcohol/week: 0.0 standard drinks  . Drug use: No   Family History  Problem Relation Age of Onset  . Cancer Father        unknown type  . Hypertension Mother   . Colon cancer Neg Hx   . Esophageal cancer Neg Hx   . Rectal cancer Neg Hx   . Stomach cancer Neg Hx    No Known Allergies Current Outpatient Medications on File Prior to Visit  Medication Sig Dispense Refill  . cholecalciferol (VITAMIN D) 1000 units tablet Take 5,000 Units by mouth daily.    . Esomeprazole Magnesium (NEXIUM 24HR PO) Take 20 mg by mouth daily as needed.    . Multiple Vitamins-Minerals (CENTRUM PO) Take 1 tablet by mouth 2 (two) times a week. Take one by mouth daily     No current facility-administered medications on file prior to visit.    Review of Systems  Constitutional: Negative for activity change, appetite change, fatigue, fever and unexpected weight change.  HENT: Negative for congestion, ear pain, rhinorrhea, sinus pressure and sore throat.   Eyes: Negative for pain, redness and visual disturbance.  Respiratory: Negative for cough, shortness of breath and wheezing.   Cardiovascular: Negative for chest pain and palpitations.  Gastrointestinal: Negative for abdominal pain, blood in stool, constipation and diarrhea.  Endocrine: Negative for polydipsia and polyuria.  Genitourinary: Negative for dysuria, frequency and urgency.  Musculoskeletal: Negative for arthralgias, back pain and myalgias.  Skin: Negative for pallor and rash.  Allergic/Immunologic: Negative for environmental allergies.  Neurological: Negative for dizziness, syncope and headaches.  Hematological: Negative for adenopathy. Does not bruise/bleed easily.   Psychiatric/Behavioral: Negative for decreased concentration and dysphoric mood. The patient is not nervous/anxious.        Objective:   Physical Exam Constitutional:      General: She is not in acute distress.    Appearance: Normal appearance. She is well-developed. She is obese. She is not ill-appearing or diaphoretic.  HENT:     Head: Normocephalic and atraumatic.     Right Ear: Tympanic membrane, ear canal and external ear normal.     Left Ear: Tympanic membrane, ear canal and external ear normal.     Nose: Nose normal. No congestion.     Mouth/Throat:     Mouth: Mucous membranes are moist.     Pharynx: Oropharynx is clear. No posterior oropharyngeal erythema.  Eyes:  General: No scleral icterus.    Extraocular Movements: Extraocular movements intact.     Conjunctiva/sclera: Conjunctivae normal.     Pupils: Pupils are equal, round, and reactive to light.  Neck:     Thyroid: No thyromegaly.     Vascular: No carotid bruit or JVD.  Cardiovascular:     Rate and Rhythm: Normal rate and regular rhythm.     Pulses: Normal pulses.     Heart sounds: Normal heart sounds. No gallop.   Pulmonary:     Effort: Pulmonary effort is normal. No respiratory distress.     Breath sounds: Normal breath sounds. No wheezing.     Comments: Good air exch Chest:     Chest wall: No tenderness.  Abdominal:     General: Bowel sounds are normal. There is no distension or abdominal bruit.     Palpations: Abdomen is soft. There is no mass.     Tenderness: There is no abdominal tenderness.     Hernia: No hernia is present.  Genitourinary:    Comments: Breast exam: No mass, nodules, thickening, tenderness, bulging, retraction, inflamation, nipple discharge or skin changes noted.  No axillary or clavicular LA.     Musculoskeletal:        General: No tenderness. Normal range of motion.     Cervical back: Normal range of motion and neck supple. No rigidity. No muscular tenderness.     Right lower  leg: No edema.     Left lower leg: No edema.  Lymphadenopathy:     Cervical: No cervical adenopathy.  Skin:    General: Skin is warm and dry.     Coloration: Skin is not pale.     Findings: No erythema or rash.     Comments: sks diffusely  Some lentigines   Neurological:     Mental Status: She is alert. Mental status is at baseline.     Cranial Nerves: No cranial nerve deficit.     Motor: No abnormal muscle tone.     Coordination: Coordination normal.     Gait: Gait normal.     Deep Tendon Reflexes: Reflexes are normal and symmetric. Reflexes normal.  Psychiatric:        Mood and Affect: Mood normal.        Cognition and Memory: Cognition and memory normal.     Comments: Pleasant            Assessment & Plan:   Problem List Items Addressed This Visit      Cardiovascular and Mediastinum   Essential hypertension    bp in fair control at this time  BP Readings from Last 1 Encounters:  11/01/20 110/66   No changes needed Most recent labs reviewed  Disc lifstyle change with low sodium diet and exercise  Plan to continue hctz 25 mg daily      Relevant Medications   atorvastatin (LIPITOR) 10 MG tablet   hydrochlorothiazide (HYDRODIURIL) 25 MG tablet     Other   Thrombocytopenia (HCC)    Myelodysplastic syndrome -followed by hematology  Platelet ct of 100  No bleeding or bruising       Depression with anxiety    Reviewed stressors/ coping techniques/symptoms/ support sources/ tx options and side effects in detail today Low motivation but otherwise doing well  Plan to continue 50 mg sertraline daily      Relevant Medications   sertraline (ZOLOFT) 50 MG tablet   Other neutropenia (HCC)    Wbc 2.9  Obesity    Discussed how this problem influences overall health and the risks it imposes  Reviewed plan for weight loss with lower calorie diet (via better food choices and also portion control or program like weight watchers) and exercise building up to or  more than 30 minutes 5 days per week including some aerobic activity   Unfortunately does not know what will motivate her      Routine general medical examination at a health care facility - Primary    Reviewed health habits including diet and exercise and skin cancer prevention Reviewed appropriate screening tests for age  Also reviewed health mt list, fam hx and immunization status , as well as social and family history   See HPI Labs reviewed  Disc eating habits and need for exercise  amw rev  Declined flu shot  Is covid vaccinated with booster  Enc her to consider shingrix vaccine Rev dexa 3/21  No falls or fx and takes vit D      Hyperlipidemia    Disc goals for lipids and reasons to control them Rev last labs with pt Rev low sat fat diet in detail Controlled with atorvastatin 10 mg daily and diet      Relevant Medications   atorvastatin (LIPITOR) 10 MG tablet   hydrochlorothiazide (HYDRODIURIL) 25 MG tablet   Prediabetes    Lab Results  Component Value Date   HGBA1C 6.1 10/25/2020   This is up  Handouts given disc imp of low glycemic diet and wt loss to prevent DM2       Myelodysplastic syndrome, unspecified ([place])    No symptoms  Wbc is 2.9 and platelet 100  Plan to f/u with heme as needed  Will follow

## 2020-11-01 NOTE — Assessment & Plan Note (Signed)
Disc goals for lipids and reasons to control them Rev last labs with pt Rev low sat fat diet in detail Controlled with atorvastatin 10 mg daily and diet

## 2020-11-01 NOTE — Assessment & Plan Note (Signed)
Lab Results  Component Value Date   HGBA1C 6.1 10/25/2020   This is up  Handouts given disc imp of low glycemic diet and wt loss to prevent DM2

## 2020-11-01 NOTE — Assessment & Plan Note (Signed)
No symptoms  Wbc is 2.9 and platelet 100  Plan to f/u with heme as needed  Will follow

## 2020-11-01 NOTE — Assessment & Plan Note (Signed)
Wbc 2.9

## 2020-11-01 NOTE — Assessment & Plan Note (Signed)
Myelodysplastic syndrome -followed by hematology  Platelet ct of 100  No bleeding or bruising

## 2021-01-04 ENCOUNTER — Other Ambulatory Visit: Payer: Self-pay | Admitting: Family Medicine

## 2021-01-04 DIAGNOSIS — Z1231 Encounter for screening mammogram for malignant neoplasm of breast: Secondary | ICD-10-CM

## 2021-01-29 DIAGNOSIS — H43813 Vitreous degeneration, bilateral: Secondary | ICD-10-CM | POA: Diagnosis not present

## 2021-01-29 DIAGNOSIS — H2513 Age-related nuclear cataract, bilateral: Secondary | ICD-10-CM | POA: Diagnosis not present

## 2021-01-29 DIAGNOSIS — H25013 Cortical age-related cataract, bilateral: Secondary | ICD-10-CM | POA: Diagnosis not present

## 2021-02-26 ENCOUNTER — Other Ambulatory Visit: Payer: Self-pay

## 2021-02-26 ENCOUNTER — Ambulatory Visit
Admission: RE | Admit: 2021-02-26 | Discharge: 2021-02-26 | Disposition: A | Discharge status: Home / Self Care | Payer: Medicare PPO | Source: Ambulatory Visit | Attending: Family Medicine | Admitting: Family Medicine

## 2021-02-26 DIAGNOSIS — Z1231 Encounter for screening mammogram for malignant neoplasm of breast: Secondary | ICD-10-CM | POA: Diagnosis not present

## 2021-04-30 ENCOUNTER — Ambulatory Visit: Payer: Medicare PPO | Attending: Internal Medicine

## 2021-04-30 ENCOUNTER — Other Ambulatory Visit (HOSPITAL_BASED_OUTPATIENT_CLINIC_OR_DEPARTMENT_OTHER): Payer: Self-pay

## 2021-04-30 ENCOUNTER — Other Ambulatory Visit: Payer: Self-pay

## 2021-04-30 DIAGNOSIS — Z23 Encounter for immunization: Secondary | ICD-10-CM

## 2021-04-30 MED ORDER — PFIZER-BIONT COVID-19 VAC-TRIS 30 MCG/0.3ML IM SUSP
INTRAMUSCULAR | 0 refills | Status: DC
  Filled 2021-04-30: qty 0.3, 1d supply, fill #0

## 2021-04-30 NOTE — Progress Notes (Signed)
   Covid-19 Vaccination Clinic  Name:  Kathryn Weaver    MRN: [id number] DOB: August 08, 1946  04/30/2021  [name] was observed post Covid-19 immunization for 15 minutes without incident. She was provided with Vaccine Information Sheet and instruction to access the V-Safe system.   [name] was instructed to call 911 with any severe reactions post vaccine: Difficulty breathing  Swelling of face and throat  A fast heartbeat  A bad rash all over body  Dizziness and weakness   Immunizations Administered     Name Date Dose VIS Date Route   PFIZER Comrnaty(Gray TOP) Covid-19 Vaccine 04/30/2021 11:22 AM 0.3 mL 10/19/2020 Intramuscular   Manufacturer: [place]   Lot: [id number]   [place]: [phone]

## 2021-05-19 ENCOUNTER — Emergency Department (HOSPITAL_COMMUNITY): Payer: Medicare PPO

## 2021-05-19 ENCOUNTER — Emergency Department (HOSPITAL_COMMUNITY)
Admission: EM | Admit: 2021-05-19 | Discharge: 2021-05-20 | Disposition: A | Discharge status: Home / Self Care | Payer: Medicare PPO | Attending: Emergency Medicine | Admitting: Emergency Medicine

## 2021-05-19 ENCOUNTER — Other Ambulatory Visit: Payer: Self-pay

## 2021-05-19 DIAGNOSIS — R1013 Epigastric pain: Secondary | ICD-10-CM | POA: Diagnosis not present

## 2021-05-19 DIAGNOSIS — I10 Essential (primary) hypertension: Secondary | ICD-10-CM | POA: Insufficient documentation

## 2021-05-19 DIAGNOSIS — R945 Abnormal results of liver function studies: Secondary | ICD-10-CM | POA: Diagnosis not present

## 2021-05-19 DIAGNOSIS — R079 Chest pain, unspecified: Secondary | ICD-10-CM

## 2021-05-19 DIAGNOSIS — R0789 Other chest pain: Secondary | ICD-10-CM | POA: Insufficient documentation

## 2021-05-19 DIAGNOSIS — Z79899 Other long term (current) drug therapy: Secondary | ICD-10-CM | POA: Diagnosis not present

## 2021-05-19 DIAGNOSIS — K219 Gastro-esophageal reflux disease without esophagitis: Secondary | ICD-10-CM | POA: Diagnosis not present

## 2021-05-19 DIAGNOSIS — R748 Abnormal levels of other serum enzymes: Secondary | ICD-10-CM

## 2021-05-19 DIAGNOSIS — K8020 Calculus of gallbladder without cholecystitis without obstruction: Secondary | ICD-10-CM | POA: Diagnosis not present

## 2021-05-19 DIAGNOSIS — R0902 Hypoxemia: Secondary | ICD-10-CM | POA: Diagnosis not present

## 2021-05-19 DIAGNOSIS — K449 Diaphragmatic hernia without obstruction or gangrene: Secondary | ICD-10-CM | POA: Diagnosis not present

## 2021-05-19 DIAGNOSIS — I959 Hypotension, unspecified: Secondary | ICD-10-CM | POA: Diagnosis not present

## 2021-05-19 NOTE — ED Triage Notes (Signed)
Pt bib EMS from home due to centralized, non-radiating chest and epigastric pain that started at 1730 during supper. Pt received 324 ASA and 2 nitro. Pt systolic BP begat 638 and decrease to 98 systolic after 2 nitro tablets  20 G in left AC placed.

## 2021-05-20 ENCOUNTER — Emergency Department (HOSPITAL_COMMUNITY): Payer: Medicare PPO

## 2021-05-20 LAB — TROPONIN I (HIGH SENSITIVITY)
Troponin I (High Sensitivity): 4 ng/L (ref <18)
Troponin I (High Sensitivity): 5 ng/L (ref <18)

## 2021-05-20 LAB — CBC WITH DIFFERENTIAL/PLATELET
Abs Immature Granulocytes: 0.03 10*3/uL (ref 0.00–0.07)
Basophils Absolute: 0 10*3/uL (ref 0.0–0.1)
Basophils Relative: 0 %
Eosinophils Absolute: 0 10*3/uL (ref 0.0–0.5)
Eosinophils Relative: 0 %
HCT: 40.2 % (ref 36.0–46.0)
Hemoglobin: 12.9 g/dL (ref 12.0–15.0)
Immature Granulocytes: 0 %
Lymphocytes Relative: 3 %
Lymphs Abs: 0.3 10*3/uL — ABNORMAL LOW (ref 0.7–4.0)
MCH: 31.9 pg (ref 26.0–34.0)
MCHC: 32.1 g/dL (ref 30.0–36.0)
MCV: 99.3 fL (ref 80.0–100.0)
Monocytes Absolute: 0.1 10*3/uL (ref 0.1–1.0)
Monocytes Relative: 1 %
Neutro Abs: 9.9 10*3/uL — ABNORMAL HIGH (ref 1.7–7.7)
Neutrophils Relative %: 96 %
Platelets: 106 10*3/uL — ABNORMAL LOW (ref 150–400)
RBC: 4.05 MIL/uL (ref 3.87–5.11)
RDW: 12.7 % (ref 11.5–15.5)
WBC: 10.1 10*3/uL (ref 4.0–10.5)
nRBC: 0 % (ref 0.0–0.2)

## 2021-05-20 LAB — COMPREHENSIVE METABOLIC PANEL
ALT: 179 U/L — ABNORMAL HIGH (ref 0–44)
AST: 395 U/L — ABNORMAL HIGH (ref 15–41)
Albumin: 3.1 g/dL — ABNORMAL LOW (ref 3.5–5.0)
Alkaline Phosphatase: 94 U/L (ref 38–126)
Anion gap: 11 (ref 5–15)
BUN: 13 mg/dL (ref 8–23)
CO2: 27 mmol/L (ref 22–32)
Calcium: 10.2 mg/dL (ref 8.9–10.3)
Chloride: 102 mmol/L (ref 98–111)
Creatinine, Ser: 0.94 mg/dL (ref 0.44–1.00)
GFR, Estimated: >60 mL/min (ref >60)
Glucose, Bld: 228 mg/dL — ABNORMAL HIGH (ref 70–99)
Potassium: 3.2 mmol/L — ABNORMAL LOW (ref 3.5–5.1)
Sodium: 140 mmol/L (ref 135–145)
Total Bilirubin: 1.5 mg/dL — ABNORMAL HIGH (ref 0.3–1.2)
Total Protein: 7.4 g/dL (ref 6.5–8.1)

## 2021-05-20 LAB — LIPASE, BLOOD: Lipase: 61 U/L — ABNORMAL HIGH (ref 11–51)

## 2021-05-20 NOTE — Discharge Instructions (Addendum)
Call your primary care doctor or specialist as discussed in the next 2-3 days.   Return immediately back to the ER if:  Your symptoms worsen within the next 12-24 hours. You develop new symptoms such as new fevers, persistent vomiting, new pain, shortness of breath, or new weakness or numbness, or if you have any other concerns.  

## 2021-05-20 NOTE — ED Provider Notes (Signed)
[facility] EMERGENCY DEPARTMENT Provider Note   CSN: [id number] Arrival date & time: 05/19/21  2259     History Chief Complaint  Patient presents with   Chest Pain    Kathryn Weaver is a 75 y.o. female.  Presents with chief complaint of chest pain describes it as her lower chest/epigastric region sharp and persistent for about 2 to 3 hours before resolving now.  She denies any prior cardiac events.  Denies fevers vomiting cough or diarrhea.  States that currently the pain is nearly gone.      Past Medical History:  Diagnosis Date   Adenomatous polyp of colon 03/2007   Anemia    s/p GI bleed, hospital 5/08   Anxiety    Depression    Diverticulosis    Endometriosis    uterine US 06/2003   GERD (gastroesophageal reflux disease)    Hiatal hernia    EGD 5/08- stricture, HH, gastritis, GERD   Hyperlipidemia    Hypertension    Hypertension    Iron deficiency 03/16/2014    Patient Active Problem List   Diagnosis Date Noted   Diminished pulses in lower extremity 05/24/2020   Myelodysplastic syndrome, unspecified ([place]) 08/31/2019   Estrogen deficiency 08/31/2019   Diverticulitis 04/27/2018   Constipation 03/18/2018   MGUS (monoclonal gammopathy of unknown significance) 09/17/2017   Prediabetes 10/21/2016   Somnolence, daytime 04/11/2016   Fatigue 04/05/2016   Internal hemorrhoid 04/20/2014   Screening mammogram, encounter for 03/01/2014   Uterine prolapse 03/01/2014   Stress reaction 09/01/2012   Hyperlipidemia 04/28/2012   Routine general medical examination at a health care facility 04/20/2012   Obesity 02/05/2012   Other neutropenia ([place]) 10/23/2010   Thrombocytopenia ([place]) 10/24/2009   POSTMENOPAUSAL STATUS 08/09/2008   Depression with anxiety 08/05/2007   Essential hypertension 08/05/2007   GERD 04/20/2007   DIVERTICULAR DISEASE 04/20/2007   History of colonic polyps 04/13/2007    Past Surgical History:  Procedure Laterality Date    cardiac CT  9/12   normal    HAMMER TOE SURGERY Left 12/02/2017   KNEE SURGERY Left    Cyst behind left knee.   TUBAL LIGATION       OB History   No obstetric history on file.     Family History  Problem Relation Age of Onset   Cancer Father        unknown type   Hypertension Mother    Colon cancer Neg Hx    Esophageal cancer Neg Hx    Rectal cancer Neg Hx    Stomach cancer Neg Hx     Social History   Tobacco Use   Smoking status: Never   Smokeless tobacco: Never  Vaping Use   Vaping Use: Never used  Substance Use Topics   Alcohol use: No    Alcohol/week: 0.0 standard drinks   Drug use: No    Home Medications Prior to Admission medications   Medication Sig Start Date End Date Taking? Authorizing Provider  acetaminophen (TYLENOL) 500 MG tablet Take 1,000 mg by mouth every 6 (six) hours as needed for moderate pain or headache.   Yes [provider]  atorvastatin (LIPITOR) 10 MG tablet Take 1 tablet (10 mg total) by mouth daily. 11/01/20  Yes Tower, [name], MD  Esomeprazole Magnesium (NEXIUM 24HR PO) Take 20 mg by mouth daily as needed (heartburn).   Yes [provider]  hydrochlorothiazide (HYDRODIURIL) 25 MG tablet Take 1 tablet (25 mg total) by mouth  daily. 11/01/20  Yes Tower, [name], MD  Multiple Vitamins-Minerals (CENTRUM PO) Take 1 tablet by mouth daily.   Yes [provider]  Naphazoline-Pheniramine (ALLERGY EYE OP) Place 1 drop into the right eye daily.   Yes [provider]  sertraline (ZOLOFT) 50 MG tablet TAKE 1 TABLET BY MOUTH EVERY DAY Patient taking differently: Take 50 mg by mouth daily as needed (mood/depression). 11/01/20  Yes Tower, [name], MD  COVID-19 mRNA Vac-TriS, Pfizer, (PFIZER-BIONT COVID-19 VAC-TRIS) SUSP injection Inject into the muscle. Patient not taking: Reported on 05/20/2021 04/30/21   Carlyle Basques, MD    Allergies    Patient has no known allergies.  Review of Systems   Review of Systems   Constitutional:  Negative for fever.  HENT:  Negative for ear pain.   Eyes:  Negative for pain.  Respiratory:  Negative for cough.   Cardiovascular:  Positive for chest pain.  Gastrointestinal:  Negative for abdominal pain.  Genitourinary:  Negative for flank pain.  Musculoskeletal:  Negative for back pain.  Skin:  Negative for rash.  Neurological:  Negative for headaches.   Physical Exam Updated Vital Signs BP (!) 125/59   Pulse 75   Temp 97.7 F (36.5 C) (Oral)   Resp (!) 21   SpO2 99%   Physical Exam Constitutional:      General: She is not in acute distress.    Appearance: Normal appearance.  HENT:     Head: Normocephalic.     Nose: Nose normal.  Eyes:     Extraocular Movements: Extraocular movements intact.  Cardiovascular:     Rate and Rhythm: Normal rate.  Pulmonary:     Effort: Pulmonary effort is normal.  Abdominal:     General: There is no distension.     Tenderness: There is no abdominal tenderness. There is no guarding or rebound.  Musculoskeletal:        General: Normal range of motion.     Cervical back: Normal range of motion.  Neurological:     General: No focal deficit present.     Mental Status: She is alert. Mental status is at baseline.    ED Results / Procedures / Treatments   Labs (all labs ordered are listed, but only abnormal results are displayed) Labs Reviewed  CBC WITH DIFFERENTIAL/PLATELET - Abnormal; Notable for the following components:      Result Value   Platelets 106 (*)    Neutro Abs 9.9 (*)    Lymphs Abs 0.3 (*)    All other components within normal limits  COMPREHENSIVE METABOLIC PANEL - Abnormal; Notable for the following components:   Potassium 3.2 (*)    Glucose, Bld 228 (*)    Albumin 3.1 (*)    AST 395 (*)    ALT 179 (*)    Total Bilirubin 1.5 (*)    All other components within normal limits  LIPASE, BLOOD - Abnormal; Notable for the following components:   Lipase 61 (*)    All other components within normal  limits  TROPONIN I (HIGH SENSITIVITY)  TROPONIN I (HIGH SENSITIVITY)    EKG EKG Interpretation  Date/Time:  Saturday May 19 2021 23:14:58 EDT Ventricular Rate:  80 PR Interval:  191 QRS Duration: 102 QT Interval:  411 QTC Calculation: 475 R Axis:   4 Text Interpretation: Sinus rhythm Biatrial enlargement RSR' in V1 or V2, right VCD or RVH Minimal ST elevation, anterior leads Confirmed by Thamas Jaegers ([id number]) on 05/19/2021 11:29:04 PM  Radiology DG Chest Port 1 View  Result Date: 05/20/2021 CLINICAL DATA:  75 year old female with chest pain. EXAM: PORTABLE CHEST 1 VIEW COMPARISON:  Chest radiograph dated 07/29/2011 FINDINGS: No focal consolidation, pleural effusion, or pneumothorax. There is a large hiatal hernia, significantly increased in size since the prior radiograph. Top-normal cardiac silhouette. No acute osseous pathology. IMPRESSION: 1. No acute cardiopulmonary process. 2. Large hiatal hernia. Electronically Signed   By: Anner Crete M.D.   On: 05/20/2021 00:02   US Abdomen Limited RUQ (LIVER/GB)  Result Date: 05/20/2021 CLINICAL DATA:  75 year old female with abdominal pain. EXAM: ULTRASOUND ABDOMEN LIMITED RIGHT UPPER QUADRANT COMPARISON:  None. FINDINGS: Gallbladder: There is small stones within the gallbladder. No gallbladder wall thickening or pericholecystic fluid. Negative sonographic Murphy's sign. Common bile duct: Diameter: 5 mm Liver: No focal lesion identified. Within normal limits in parenchymal echogenicity. Portal vein is patent on color Doppler imaging with normal direction of blood flow towards the liver. Other: None. IMPRESSION: Cholelithiasis without sonographic evidence of acute cholecystitis. Electronically Signed   By: Anner Crete M.D.   On: 05/20/2021 02:06    Procedures Procedures   Medications Ordered in ED Medications - No data to display  ED Course  I have reviewed the triage vital signs and the nursing notes.  Pertinent labs & imaging  results that were available during my care of the patient were reviewed by me and considered in my medical decision making (see chart for details).    MDM Rules/Calculators/A&P                          Patient appears comfortable at this time as pain is nearly completely resolved.  Location is atypical and more in the epigastric/lower chest region.  Cardiac work-up is negative troponins x2 unremarkable.  Labs show elevated liver enzymes, CT ultrasound pursued with no evidence of cholecystitis.  Patient remains symptom-free and comfortable.  Will be discharged home.  Advise follow-up with a primary care doctor within the week for elevated liver enzymes.  Advised immediate return if she has worsening pain or any additional concerns such as fevers.   Final Clinical Impression(s) / ED Diagnoses Final diagnoses:  Epigastric pain    Rx / DC Orders ED Discharge Orders     None        Luna Fuse, MD 05/20/21 [phone]

## 2021-06-04 ENCOUNTER — Encounter: Payer: Self-pay | Admitting: Family Medicine

## 2021-06-04 ENCOUNTER — Ambulatory Visit: Payer: Medicare PPO | Admitting: Family Medicine

## 2021-06-04 ENCOUNTER — Other Ambulatory Visit: Payer: Self-pay

## 2021-06-04 VITALS — BP 108/66 | HR 54 | Temp 97.9°F | Ht 62.5 in | Wt 176.2 lb

## 2021-06-04 DIAGNOSIS — I10 Essential (primary) hypertension: Secondary | ICD-10-CM

## 2021-06-04 DIAGNOSIS — R5382 Chronic fatigue, unspecified: Secondary | ICD-10-CM

## 2021-06-04 DIAGNOSIS — R7303 Prediabetes: Secondary | ICD-10-CM

## 2021-06-04 DIAGNOSIS — D708 Other neutropenia: Secondary | ICD-10-CM

## 2021-06-04 DIAGNOSIS — R7401 Elevation of levels of liver transaminase levels: Secondary | ICD-10-CM

## 2021-06-04 DIAGNOSIS — K8020 Calculus of gallbladder without cholecystitis without obstruction: Secondary | ICD-10-CM | POA: Insufficient documentation

## 2021-06-04 DIAGNOSIS — D469 Myelodysplastic syndrome, unspecified: Secondary | ICD-10-CM | POA: Diagnosis not present

## 2021-06-04 LAB — BASIC METABOLIC PANEL WITH GFR
BUN: 15 mg/dL (ref 6–23)
CO2: 30 meq/L (ref 19–32)
Calcium: 10.8 mg/dL — ABNORMAL HIGH (ref 8.4–10.5)
Chloride: 103 meq/L (ref 96–112)
Creatinine, Ser: 0.85 mg/dL (ref 0.40–1.20)
GFR: 67.67 mL/min
Glucose, Bld: 103 mg/dL — ABNORMAL HIGH (ref 70–99)
Potassium: 4.3 meq/L (ref 3.5–5.1)
Sodium: 140 meq/L (ref 135–145)

## 2021-06-04 LAB — CBC WITH DIFFERENTIAL/PLATELET
Basophils Absolute: 0 10*3/uL (ref 0.0–0.1)
Basophils Relative: 0.3 % (ref 0.0–3.0)
Eosinophils Absolute: 0.1 10*3/uL (ref 0.0–0.7)
Eosinophils Relative: 2.2 % (ref 0.0–5.0)
HCT: 37.8 % (ref 36.0–46.0)
Hemoglobin: 12.3 g/dL (ref 12.0–15.0)
Lymphocytes Relative: 44.8 % (ref 12.0–46.0)
Lymphs Abs: 1.5 10*3/uL (ref 0.7–4.0)
MCHC: 32.6 g/dL (ref 30.0–36.0)
MCV: 96.8 fl (ref 78.0–100.0)
Monocytes Absolute: 0.5 10*3/uL (ref 0.1–1.0)
Monocytes Relative: 16 % — ABNORMAL HIGH (ref 3.0–12.0)
Neutro Abs: 1.2 10*3/uL — ABNORMAL LOW (ref 1.4–7.7)
Neutrophils Relative %: 36.7 % — ABNORMAL LOW (ref 43.0–77.0)
Platelets: 110 10*3/uL — ABNORMAL LOW (ref 150.0–400.0)
RBC: 3.91 Mil/uL (ref 3.87–5.11)
RDW: 13.7 % (ref 11.5–15.5)
WBC: 3.4 10*3/uL — ABNORMAL LOW (ref 4.0–10.5)

## 2021-06-04 LAB — IRON: Iron: 70 ug/dL (ref 42–145)

## 2021-06-04 LAB — HEPATIC FUNCTION PANEL
ALT: 23 U/L (ref 0–35)
AST: 18 U/L (ref 0–37)
Albumin: 3.7 g/dL (ref 3.5–5.2)
Alkaline Phosphatase: 98 U/L (ref 39–117)
Bilirubin, Direct: 0.2 mg/dL (ref 0.0–0.3)
Total Bilirubin: 0.6 mg/dL (ref 0.2–1.2)
Total Protein: 7.4 g/dL (ref 6.0–8.3)

## 2021-06-04 LAB — VITAMIN D 25 HYDROXY (VIT D DEFICIENCY, FRACTURES): VITD: 47.84 ng/mL (ref 30.00–100.00)

## 2021-06-04 LAB — VITAMIN B12: Vitamin B-12: 868 pg/mL (ref 211–911)

## 2021-06-04 LAB — HEMOGLOBIN A1C: Hgb A1c MFr Bld: 6.2 % (ref 4.6–6.5)

## 2021-06-04 LAB — TSH: TSH: 3.38 u[IU]/mL (ref 0.35–5.50)

## 2021-06-04 NOTE — Assessment & Plan Note (Signed)
Gallstones seen on [place] in ER for chest/abd pain and vomiting after eating fatty food  Reviewed hospital records, lab results and studies in detail  Reassuring exam today  Disc gallstones and handout given gen surg red made Repeat lft today

## 2021-06-04 NOTE — Progress Notes (Signed)
Subjective:    Patient ID: Kathryn Weaver, female    DOB: August 11, 1946, 75 y.o.   MRN: [id number]  This visit occurred during the SARS-CoV-2 public health emergency.  Safety protocols were in place, including screening questions prior to the visit, additional usage of staff PPE, and extensive cleaning of exam room while observing appropriate contact time as indicated for disinfecting solutions.   HPI Pt presents for f/u of ER visit for cp and vomiting Gallstones Fatigue   Wt Readings from Last 3 Encounters:  06/04/21 176 lb 3 oz (79.9 kg)  11/01/20 177 lb 5 oz (80.4 kg)  05/24/20 171 lb 9 oz (77.8 kg)   31.71 kg/m  Pt presented to ER on 7/9 for chest pain/epigastric pain  This occurred after eating a fast food burger  Desc as dull epigastric pain  Felt like it would not go down/ tried to burp   (forgot to take nexium before she ate this)  Vomited and felt a little better     Symptoms resolved Cardiac w/u nl with trop times 2 neg  Labs noted elevated liver enzymes   Imaging DG Chest Port 1 View  Result Date: 05/20/2021 CLINICAL DATA:  75 year old female with chest pain. EXAM: PORTABLE CHEST 1 VIEW COMPARISON:  Chest radiograph dated 07/29/2011 FINDINGS: No focal consolidation, pleural effusion, or pneumothorax. There is a large hiatal hernia, significantly increased in size since the prior radiograph. Top-normal cardiac silhouette. No acute osseous pathology. IMPRESSION: 1. No acute cardiopulmonary process. 2. Large hiatal hernia. Electronically Signed   By: Anner Crete M.D.   On: 05/20/2021 00:02   US Abdomen Limited RUQ (LIVER/GB)  Result Date: 05/20/2021 CLINICAL DATA:  75 year old female with abdominal pain. EXAM: ULTRASOUND ABDOMEN LIMITED RIGHT UPPER QUADRANT COMPARISON:  None. FINDINGS: Gallbladder: There is small stones within the gallbladder. No gallbladder wall thickening or pericholecystic fluid. Negative sonographic Murphy's sign. Common bile duct: Diameter: 5 mm  Liver: No focal lesion identified. Within normal limits in parenchymal echogenicity. Portal vein is patent on color Doppler imaging with normal direction of blood flow towards the liver. Other: None. IMPRESSION: Cholelithiasis without sonographic evidence of acute cholecystitis. Electronically Signed   By: Anner Crete M.D.   On: 05/20/2021 02:06    Results for orders placed or performed during the hospital encounter of 05/19/21  CBC with Differential  Result Value Ref Range   WBC 10.1 4.0 - 10.5 K/uL   RBC 4.05 3.87 - 5.11 MIL/uL   Hemoglobin 12.9 12.0 - 15.0 g/dL   HCT 40.2 36.0 - 46.0 %   MCV 99.3 80.0 - 100.0 fL   MCH 31.9 26.0 - 34.0 pg   MCHC 32.1 30.0 - 36.0 g/dL   RDW 12.7 11.5 - 15.5 %   Platelets 106 (L) 150 - 400 K/uL   nRBC 0.0 0.0 - 0.2 %   Neutrophils Relative % 96 %   Neutro Abs 9.9 (H) 1.7 - 7.7 K/uL   Lymphocytes Relative 3 %   Lymphs Abs 0.3 (L) 0.7 - 4.0 K/uL   Monocytes Relative 1 %   Monocytes Absolute 0.1 0.1 - 1.0 K/uL   Eosinophils Relative 0 %   Eosinophils Absolute 0.0 0.0 - 0.5 K/uL   Basophils Relative 0 %   Basophils Absolute 0.0 0.0 - 0.1 K/uL   Immature Granulocytes 0 %   Abs Immature Granulocytes 0.03 0.00 - 0.07 K/uL  Comprehensive metabolic panel  Result Value Ref Range   Sodium 140 135 - 145 mmol/L  Potassium 3.2 (L) 3.5 - 5.1 mmol/L   Chloride 102 98 - 111 mmol/L   CO2 27 22 - 32 mmol/L   Glucose, Bld 228 (H) 70 - 99 mg/dL   BUN 13 8 - 23 mg/dL   Creatinine, Ser 0.94 0.44 - 1.00 mg/dL   Calcium 10.2 8.9 - 10.3 mg/dL   Total Protein 7.4 6.5 - 8.1 g/dL   Albumin 3.1 (L) 3.5 - 5.0 g/dL   AST 395 (H) 15 - 41 U/L   ALT 179 (H) 0 - 44 U/L   Alkaline Phosphatase 94 38 - 126 U/L   Total Bilirubin 1.5 (H) 0.3 - 1.2 mg/dL   GFR, Estimated >60 >60 mL/min   Anion gap 11 5 - 15  Lipase, blood  Result Value Ref Range   Lipase 61 (H) 11 - 51 U/L  Troponin I (High Sensitivity)  Result Value Ref Range   Troponin I (High Sensitivity) 4 <18 ng/L   Troponin I (High Sensitivity)  Result Value Ref Range   Troponin I (High Sensitivity) 5 <18 ng/L    Pt feels good today  Has been fine ever since the ER visit  No fatty food and not eating out  Drinking a lot more water   Glucose was high  Lab Results  Component Value Date   HGBA1C 6.1 10/25/2020    Also very tired   Bp is well controlled  BP Readings from Last 3 Encounters:  06/04/21 108/66  05/20/21 (!) 125/59  11/01/20 110/66   Hctz 25 mg daily  Patient Active Problem List   Diagnosis Date Noted   Cholelithiasis 06/04/2021   Diminished pulses in lower extremity 05/24/2020   Myelodysplastic syndrome, unspecified ([place]) 08/31/2019   Estrogen deficiency 08/31/2019   Diverticulitis 04/27/2018   Constipation 03/18/2018   MGUS (monoclonal gammopathy of unknown significance) 09/17/2017   Prediabetes 10/21/2016   Somnolence, daytime 04/11/2016   Fatigue 04/05/2016   Internal hemorrhoid 04/20/2014   Screening mammogram, encounter for 03/01/2014   Uterine prolapse 03/01/2014   Stress reaction 09/01/2012   Hyperlipidemia 04/28/2012   Routine general medical examination at a health care facility 04/20/2012   Obesity 02/05/2012   Other neutropenia ([place]) 10/23/2010   Thrombocytopenia ([place]) 10/24/2009   Elevated transaminase level 10/24/2009   POSTMENOPAUSAL STATUS 08/09/2008   Depression with anxiety 08/05/2007   Essential hypertension 08/05/2007   GERD 04/20/2007   DIVERTICULAR DISEASE 04/20/2007   History of colonic polyps 04/13/2007   Past Medical History:  Diagnosis Date   Adenomatous polyp of colon 03/2007   Anemia    s/p GI bleed, hospital 5/08   Anxiety    Depression    Diverticulosis    Endometriosis    uterine US 06/2003   GERD (gastroesophageal reflux disease)    Hiatal hernia    EGD 5/08- stricture, HH, gastritis, GERD   Hyperlipidemia    Hypertension    Hypertension    Iron deficiency 03/16/2014   Past Surgical History:  Procedure Laterality  Date   cardiac CT  9/12   normal    HAMMER TOE SURGERY Left 12/02/2017   KNEE SURGERY Left    Cyst behind left knee.   TUBAL LIGATION     Social History   Tobacco Use   Smoking status: Never   Smokeless tobacco: Never  Vaping Use   Vaping Use: Never used  Substance Use Topics   Alcohol use: No    Alcohol/week: 0.0 standard drinks   Drug use: No  Family History  Problem Relation Age of Onset   Cancer Father        unknown type   Hypertension Mother    Colon cancer Neg Hx    Esophageal cancer Neg Hx    Rectal cancer Neg Hx    Stomach cancer Neg Hx    No Known Allergies Current Outpatient Medications on File Prior to Visit  Medication Sig Dispense Refill   acetaminophen (TYLENOL) 500 MG tablet Take 1,000 mg by mouth every 6 (six) hours as needed for moderate pain or headache.     atorvastatin (LIPITOR) 10 MG tablet Take 1 tablet (10 mg total) by mouth daily. 90 tablet 3   Esomeprazole Magnesium (NEXIUM 24HR PO) Take 20 mg by mouth daily as needed (heartburn).     hydrochlorothiazide (HYDRODIURIL) 25 MG tablet Take 1 tablet (25 mg total) by mouth daily. 90 tablet 3   Multiple Vitamins-Minerals (CENTRUM PO) Take 1 tablet by mouth daily.     Naphazoline-Pheniramine (ALLERGY EYE OP) Place 1 drop into the right eye daily.     sertraline (ZOLOFT) 50 MG tablet TAKE 1 TABLET BY MOUTH EVERY DAY (Patient taking differently: Take 50 mg by mouth daily as needed (mood/depression).) 90 tablet 3   No current facility-administered medications on file prior to visit.    Review of Systems  Constitutional:  Positive for fatigue. Negative for activity change, appetite change, fever and unexpected weight change.  HENT:  Negative for congestion, ear pain, rhinorrhea, sinus pressure and sore throat.   Eyes:  Negative for pain, redness and visual disturbance.  Respiratory:  Negative for cough, shortness of breath and wheezing.   Cardiovascular:  Negative for chest pain and palpitations.   Gastrointestinal:  Positive for abdominal pain, nausea and vomiting. Negative for abdominal distention, anal bleeding, blood in stool, constipation, diarrhea and rectal pain.  Endocrine: Negative for polydipsia and polyuria.  Genitourinary:  Negative for dysuria, frequency and urgency.  Musculoskeletal:  Negative for arthralgias, back pain and myalgias.  Skin:  Negative for pallor and rash.  Allergic/Immunologic: Negative for environmental allergies.  Neurological:  Negative for dizziness, syncope and headaches.  Hematological:  Negative for adenopathy. Does not bruise/bleed easily.  Psychiatric/Behavioral:  Negative for decreased concentration and dysphoric mood. The patient is not nervous/anxious.       Objective:   Physical Exam Constitutional:      General: She is not in acute distress.    Appearance: Normal appearance. She is well-developed. She is obese. She is not ill-appearing or diaphoretic.  HENT:     Head: Normocephalic and atraumatic.  Eyes:     General: No scleral icterus.    Conjunctiva/sclera: Conjunctivae normal.     Pupils: Pupils are equal, round, and reactive to light.  Cardiovascular:     Rate and Rhythm: Normal rate and regular rhythm.     Heart sounds: Normal heart sounds.  Pulmonary:     Effort: Pulmonary effort is normal. No respiratory distress.     Breath sounds: Normal breath sounds. No wheezing or rales.  Abdominal:     General: Abdomen is flat. Bowel sounds are normal. There is no distension or abdominal bruit.     Palpations: Abdomen is soft. There is no fluid wave, hepatomegaly, splenomegaly, mass or pulsatile mass.     Tenderness: There is no abdominal tenderness. There is no guarding or rebound. Negative signs include Murphy's sign and McBurney's sign.     Hernia: No hernia is present.  Musculoskeletal:  Cervical back: Normal range of motion and neck supple.  Lymphadenopathy:     Cervical: No cervical adenopathy.  Skin:    General: Skin is  warm and dry.     Coloration: Skin is not pale.     Findings: No erythema.  Neurological:     Mental Status: She is alert.          Assessment & Plan:   Problem List Items Addressed This Visit       Cardiovascular and Mediastinum   Essential hypertension    bp in fair control at this time  BP Readings from Last 1 Encounters:  06/04/21 108/66  No changes needed Most recent labs reviewed  Disc lifstyle change with low sodium diet and exercise  Plan to continue hctz 25 mg daily      Relevant Orders   CBC with Differential/Platelet   TSH   Basic metabolic panel   Hepatic function panel     Digestive   Cholelithiasis - Primary    Gallstones seen on [place] in ER for chest/abd pain and vomiting after eating fatty food  Reviewed hospital records, lab results and studies in detail  Reassuring exam today  Disc gallstones and handout given gen surg red made Repeat lft today       Relevant Orders   Hepatic function panel     Other   Elevated transaminase level    Suspect due to gallstones Reviewed hospital records, lab results and studies in detail    Re check today w/o symptoms  Ref made to gen surg       Relevant Orders   Hepatic function panel   Other neutropenia ([place])    Lab today Per pt not going to hematology       Relevant Orders   CBC with Differential/Platelet   Fatigue    Pt mentions this during visit  Some exercise/not enough-enc to do indoor program  Lab today  May be multi factorial       Relevant Orders   CBC with Differential/Platelet   TSH   VITAMIN D 25 Hydroxy (Vit-D Deficiency, Fractures)   Basic metabolic panel   Hepatic function panel   Vitamin B12   Iron   Prediabetes    Glucose was over 200 at ER a1c today  disc imp of low glycemic diet and wt loss to prevent DM2        Relevant Orders   Hemoglobin A1c   Myelodysplastic syndrome, unspecified ([place])    Cbc today  Per pt-hematology signed off Now more fatigue        Relevant Orders   CBC with Differential/Platelet   Iron

## 2021-06-04 NOTE — Assessment & Plan Note (Signed)
Pt mentions this during visit  Some exercise/not enough-enc to do indoor program  Lab today  May be multi factorial

## 2021-06-04 NOTE — Assessment & Plan Note (Signed)
bp in fair control at this time  BP Readings from Last 1 Encounters:  06/04/21 108/66   No changes needed Most recent labs reviewed  Disc lifstyle change with low sodium diet and exercise  Plan to continue hctz 25 mg daily

## 2021-06-04 NOTE — Assessment & Plan Note (Signed)
Suspect due to gallstones Reviewed hospital records, lab results and studies in detail    Re check today w/o symptoms  Ref made to gen surg

## 2021-06-04 NOTE — Assessment & Plan Note (Signed)
Lab today Per pt not going to hematology

## 2021-06-04 NOTE — Assessment & Plan Note (Signed)
Cbc today  Per pt-hematology signed off Now more fatigue

## 2021-06-04 NOTE — Assessment & Plan Note (Signed)
Glucose was over 200 at ER a1c today  disc imp of low glycemic diet and wt loss to prevent DM2

## 2021-06-04 NOTE — Patient Instructions (Signed)
I placed a referral to a general surgeon -you will get a call Eat a low fat diet  Drink water If symptoms return let us know  If severe- go to the ER  Labs today Liver Some for fatigue

## 2021-06-05 ENCOUNTER — Encounter: Payer: Self-pay | Admitting: *Deleted

## 2021-07-26 DIAGNOSIS — H1045 Other chronic allergic conjunctivitis: Secondary | ICD-10-CM | POA: Diagnosis not present

## 2021-08-24 DIAGNOSIS — G8929 Other chronic pain: Secondary | ICD-10-CM | POA: Diagnosis not present

## 2021-08-24 DIAGNOSIS — F325 Major depressive disorder, single episode, in full remission: Secondary | ICD-10-CM | POA: Diagnosis not present

## 2021-08-24 DIAGNOSIS — K5900 Constipation, unspecified: Secondary | ICD-10-CM | POA: Diagnosis not present

## 2021-08-24 DIAGNOSIS — D6859 Other primary thrombophilia: Secondary | ICD-10-CM | POA: Diagnosis not present

## 2021-08-24 DIAGNOSIS — K219 Gastro-esophageal reflux disease without esophagitis: Secondary | ICD-10-CM | POA: Diagnosis not present

## 2021-08-24 DIAGNOSIS — E785 Hyperlipidemia, unspecified: Secondary | ICD-10-CM | POA: Diagnosis not present

## 2021-08-24 DIAGNOSIS — M1990 Unspecified osteoarthritis, unspecified site: Secondary | ICD-10-CM | POA: Diagnosis not present

## 2021-08-24 DIAGNOSIS — E669 Obesity, unspecified: Secondary | ICD-10-CM | POA: Diagnosis not present

## 2021-08-24 DIAGNOSIS — I10 Essential (primary) hypertension: Secondary | ICD-10-CM | POA: Diagnosis not present

## 2021-08-26 ENCOUNTER — Emergency Department (HOSPITAL_COMMUNITY): Payer: Medicare PPO

## 2021-08-26 ENCOUNTER — Other Ambulatory Visit: Payer: Self-pay

## 2021-08-26 ENCOUNTER — Emergency Department (HOSPITAL_COMMUNITY)
Admission: EM | Admit: 2021-08-26 | Discharge: 2021-08-27 | Disposition: A | Discharge status: Home / Self Care | Payer: Medicare PPO | Attending: Emergency Medicine | Admitting: Emergency Medicine

## 2021-08-26 ENCOUNTER — Encounter (HOSPITAL_COMMUNITY): Payer: Self-pay

## 2021-08-26 DIAGNOSIS — R748 Abnormal levels of other serum enzymes: Secondary | ICD-10-CM | POA: Insufficient documentation

## 2021-08-26 DIAGNOSIS — R1011 Right upper quadrant pain: Secondary | ICD-10-CM

## 2021-08-26 DIAGNOSIS — Z79899 Other long term (current) drug therapy: Secondary | ICD-10-CM | POA: Insufficient documentation

## 2021-08-26 DIAGNOSIS — I10 Essential (primary) hypertension: Secondary | ICD-10-CM | POA: Diagnosis not present

## 2021-08-26 DIAGNOSIS — K8020 Calculus of gallbladder without cholecystitis without obstruction: Secondary | ICD-10-CM

## 2021-08-26 DIAGNOSIS — K8070 Calculus of gallbladder and bile duct without cholecystitis without obstruction: Secondary | ICD-10-CM | POA: Diagnosis not present

## 2021-08-26 DIAGNOSIS — R109 Unspecified abdominal pain: Secondary | ICD-10-CM | POA: Diagnosis not present

## 2021-08-26 LAB — COMPREHENSIVE METABOLIC PANEL
ALT: 71 U/L — ABNORMAL HIGH (ref 0–44)
AST: 142 U/L — ABNORMAL HIGH (ref 15–41)
Albumin: 3.5 g/dL (ref 3.5–5.0)
Alkaline Phosphatase: 98 U/L (ref 38–126)
Anion gap: 7 (ref 5–15)
BUN: 12 mg/dL (ref 8–23)
CO2: 27 mmol/L (ref 22–32)
Calcium: 10.3 mg/dL (ref 8.9–10.3)
Chloride: 103 mmol/L (ref 98–111)
Creatinine, Ser: 0.6 mg/dL (ref 0.44–1.00)
GFR, Estimated: >60 mL/min (ref >60)
Glucose, Bld: 169 mg/dL — ABNORMAL HIGH (ref 70–99)
Potassium: 3.4 mmol/L — ABNORMAL LOW (ref 3.5–5.1)
Sodium: 137 mmol/L (ref 135–145)
Total Bilirubin: 1 mg/dL (ref 0.3–1.2)
Total Protein: 7.8 g/dL (ref 6.5–8.1)

## 2021-08-26 LAB — LIPASE, BLOOD: Lipase: 68 U/L — ABNORMAL HIGH (ref 11–51)

## 2021-08-26 LAB — CBC WITH DIFFERENTIAL/PLATELET
Abs Immature Granulocytes: 0.02 10*3/uL (ref 0.00–0.07)
Basophils Absolute: 0 10*3/uL (ref 0.0–0.1)
Basophils Relative: 0 %
Eosinophils Absolute: 0 10*3/uL (ref 0.0–0.5)
Eosinophils Relative: 0 %
HCT: 39.7 % (ref 36.0–46.0)
Hemoglobin: 13 g/dL (ref 12.0–15.0)
Immature Granulocytes: 0 %
Lymphocytes Relative: 10 %
Lymphs Abs: 0.6 10*3/uL — ABNORMAL LOW (ref 0.7–4.0)
MCH: 32.3 pg (ref 26.0–34.0)
MCHC: 32.7 g/dL (ref 30.0–36.0)
MCV: 98.5 fL (ref 80.0–100.0)
Monocytes Absolute: 0.3 10*3/uL (ref 0.1–1.0)
Monocytes Relative: 5 %
Neutro Abs: 5.1 10*3/uL (ref 1.7–7.7)
Neutrophils Relative %: 85 %
Platelets: 111 10*3/uL — ABNORMAL LOW (ref 150–400)
RBC: 4.03 MIL/uL (ref 3.87–5.11)
RDW: 12.6 % (ref 11.5–15.5)
WBC: 6 10*3/uL (ref 4.0–10.5)
nRBC: 0 % (ref 0.0–0.2)

## 2021-08-26 MED ORDER — SODIUM CHLORIDE 0.9 % IV BOLUS
500.0000 mL | Freq: Once | INTRAVENOUS | Status: AC
  Administered 2021-08-26: 500 mL via INTRAVENOUS

## 2021-08-26 MED ORDER — ONDANSETRON 4 MG PO TBDP
4.0000 mg | ORAL_TABLET | Freq: Once | ORAL | Status: AC
  Administered 2021-08-26: 4 mg via ORAL
  Filled 2021-08-26: qty 1

## 2021-08-26 NOTE — ED Triage Notes (Addendum)
Pt states she started having pain at church today in her epigastric area. Ppt states some nausea  and emesis. Pt states she ran out of her reflux meds.

## 2021-08-26 NOTE — ED Provider Notes (Signed)
[facility] DEPT Provider Note   CSN: [id number] Arrival date & time: 08/26/21  1515     History Chief Complaint  Patient presents with   Abdominal Pain    Kathryn Weaver is a 75 y.o. female.  Patient with past medical history of GERD, cholelithiasis, and diverticulitis presents today with abdominal pain. She states that the pain started suddenly this morning when she was at church, initially started with nonbloody diarrhea and then nausea and nonbloody vomiting. She states that she has not had any oral intake today due to nausea. Had stew at a potluck at her church last night, no one else at church is sick that she knows of. Pain is located in the right upper quadrant, is intermittent in nature, and does not radiate. No history of abdominal surgeries. She denies any recent significant NSAID use, she does not drink alcohol. Of note, patient states she was seen for same in July with unremarkable work-up and symptoms resolved on their own.  The history is provided by the patient. No language interpreter was used.  Abdominal Pain Associated symptoms: diarrhea, nausea and vomiting   Associated symptoms: no chest pain, no chills, no constipation, no cough, no dysuria, no fatigue, no fever, no hematuria, no shortness of breath and no sore throat       Past Medical History:  Diagnosis Date   Adenomatous polyp of colon 03/2007   Anemia    s/p GI bleed, hospital 5/08   Anxiety    Depression    Diverticulosis    Endometriosis    uterine US 06/2003   GERD (gastroesophageal reflux disease)    Hiatal hernia    EGD 5/08- stricture, HH, gastritis, GERD   Hyperlipidemia    Hypertension    Hypertension    Iron deficiency 03/16/2014    Patient Active Problem List   Diagnosis Date Noted   Cholelithiasis 06/04/2021   Diminished pulses in lower extremity 05/24/2020   Myelodysplastic syndrome, unspecified ([place]) 08/31/2019   Estrogen deficiency 08/31/2019    Diverticulitis 04/27/2018   Constipation 03/18/2018   MGUS (monoclonal gammopathy of unknown significance) 09/17/2017   Prediabetes 10/21/2016   Somnolence, daytime 04/11/2016   Fatigue 04/05/2016   Internal hemorrhoid 04/20/2014   Screening mammogram, encounter for 03/01/2014   Uterine prolapse 03/01/2014   Stress reaction 09/01/2012   Hyperlipidemia 04/28/2012   Routine general medical examination at a health care facility 04/20/2012   Obesity 02/05/2012   Other neutropenia ([place]) 10/23/2010   Thrombocytopenia ([place]) 10/24/2009   Elevated transaminase level 10/24/2009   POSTMENOPAUSAL STATUS 08/09/2008   Depression with anxiety 08/05/2007   Essential hypertension 08/05/2007   GERD 04/20/2007   DIVERTICULAR DISEASE 04/20/2007   History of colonic polyps 04/13/2007    Past Surgical History:  Procedure Laterality Date   cardiac CT  9/12   normal    HAMMER TOE SURGERY Left 12/02/2017   KNEE SURGERY Left    Cyst behind left knee.   TUBAL LIGATION       OB History   No obstetric history on file.     Family History  Problem Relation Age of Onset   Cancer Father        unknown type   Hypertension Mother    Colon cancer Neg Hx    Esophageal cancer Neg Hx    Rectal cancer Neg Hx    Stomach cancer Neg Hx     Social History   Tobacco Use   Smoking status: Never  Smokeless tobacco: Never  Vaping Use   Vaping Use: Never used  Substance Use Topics   Alcohol use: No    Alcohol/week: 0.0 standard drinks   Drug use: No    Home Medications Prior to Admission medications   Medication Sig Start Date End Date Taking? Authorizing Provider  acetaminophen (TYLENOL) 500 MG tablet Take 1,000 mg by mouth every 6 (six) hours as needed for moderate pain or headache.    [provider]  atorvastatin (LIPITOR) 10 MG tablet Take 1 tablet (10 mg total) by mouth daily. 11/01/20   Tower, [name], MD  Esomeprazole Magnesium (NEXIUM 24HR PO) Take 20 mg by mouth daily as needed  (heartburn).    [provider]  hydrochlorothiazide (HYDRODIURIL) 25 MG tablet Take 1 tablet (25 mg total) by mouth daily. 11/01/20   Tower, [name], MD  Multiple Vitamins-Minerals (CENTRUM PO) Take 1 tablet by mouth daily.    [provider]  Naphazoline-Pheniramine (ALLERGY EYE OP) Place 1 drop into the right eye daily.    [provider]  sertraline (ZOLOFT) 50 MG tablet TAKE 1 TABLET BY MOUTH EVERY DAY Patient taking differently: Take 50 mg by mouth daily as needed (mood/depression). 11/01/20   Tower, [name], MD    Allergies    Patient has no known allergies.  Review of Systems   Review of Systems  Constitutional:  Negative for chills, fatigue and fever.  HENT:  Negative for congestion, postnasal drip, rhinorrhea, sore throat, trouble swallowing and voice change.   Respiratory:  Negative for cough and shortness of breath.   Cardiovascular:  Negative for chest pain, palpitations and leg swelling.  Gastrointestinal:  Positive for abdominal pain, diarrhea, nausea and vomiting. Negative for abdominal distention, blood in stool and constipation.  Genitourinary:  Negative for difficulty urinating, dysuria and hematuria.  Skin:  Negative for pallor and wound.  Neurological:  Negative for dizziness, tremors, seizures, syncope, facial asymmetry, speech difficulty, weakness, light-headedness, numbness and headaches.  Psychiatric/Behavioral:  Negative for confusion and decreased concentration.   All other systems reviewed and are negative.  Physical Exam Updated Vital Signs BP (!) 129/51 (BP Location: Left Arm)   Pulse 82   Temp 99.5 F (37.5 C) (Oral)   Resp 18   Ht 5\' 3"  (1.6 m)   Wt 78 kg   SpO2 95%   BMI 30.47 kg/m   Physical Exam Vitals and nursing note reviewed.  Constitutional:      General: She is not in acute distress.    Appearance: Normal appearance. She is well-developed and normal weight. She is not ill-appearing, toxic-appearing or  diaphoretic.  HENT:     Head: Normocephalic and atraumatic.  Eyes:     General: No scleral icterus. Cardiovascular:     Rate and Rhythm: Normal rate and regular rhythm.  Pulmonary:     Effort: Pulmonary effort is normal. No respiratory distress.     Breath sounds: Normal breath sounds.  Abdominal:     General: Abdomen is flat. Bowel sounds are normal. There is no distension.     Palpations: Abdomen is soft.     Tenderness: There is abdominal tenderness in the right upper quadrant. There is no right CVA tenderness, left CVA tenderness, guarding or rebound. Positive signs include Murphy's sign. Negative signs include Rovsing's sign, McBurney's sign and psoas sign.  Musculoskeletal:        General: Normal range of motion.     Cervical back: Normal range of motion.  Skin:  General: Skin is warm and dry.  Neurological:     General: No focal deficit present.     Mental Status: She is alert.  Psychiatric:        Mood and Affect: Mood normal.        Behavior: Behavior normal.    ED Results / Procedures / Treatments   Labs (all labs ordered are listed, but only abnormal results are displayed) Labs Reviewed  LIPASE, BLOOD - Abnormal; Notable for the following components:      Result Value   Lipase 68 (*)    All other components within normal limits  COMPREHENSIVE METABOLIC PANEL - Abnormal; Notable for the following components:   Potassium 3.4 (*)    Glucose, Bld 169 (*)    AST 142 (*)    ALT 71 (*)    All other components within normal limits  CBC WITH DIFFERENTIAL/PLATELET - Abnormal; Notable for the following components:   Platelets 111 (*)    Lymphs Abs 0.6 (*)    All other components within normal limits    EKG None  Radiology CT ABDOMEN PELVIS W CONTRAST  Result Date: 08/27/2021 CLINICAL DATA:  Abdominal pain, nausea/vomiting EXAM: CT ABDOMEN AND PELVIS WITH CONTRAST TECHNIQUE: Multidetector CT imaging of the abdomen and pelvis was performed using the standard  protocol following bolus administration of intravenous contrast. CONTRAST:  53mL OMNIPAQUE IOHEXOL 350 MG/ML SOLN COMPARISON:  Right upper quadrant ultrasound dated 08/26/2021 FINDINGS: Lower chest: Compressive atelectasis in the medial aspect of the bilateral lower lobes. Hepatobiliary: Calcified granuloma in the posterior right hepatic lobe (series 2/image 32). Liver is otherwise within normal limits. Tiny layering gallstones (series 2/image 28). Mild gallbladder wall thickening without gallbladder distension. No intrahepatic or extrahepatic ductal dilatation. Pancreas: Within normal limits. Spleen: Calcified splenic granulomata. Adrenals/Urinary Tract: Adrenal glands are within normal limits. Kidneys are within normal limits.  No hydronephrosis. Bladder is within normal limits. Stomach/Bowel: Large hiatal hernia/inverted intrathoracic stomach, incompletely visualized. No evidence of bowel obstruction. Normal appendix (series 2/image 29). Scattered colonic diverticulosis, without evidence of diverticulitis. Vascular/Lymphatic: No evidence of abdominal aortic aneurysm. Atherosclerotic calcifications of the abdominal aorta and branch vessels. No suspicious abdominopelvic lymphadenopathy. Reproductive: Uterus is within normal limits. Bilateral ovaries are within normal limits. Other: No abdominopelvic ascites. Musculoskeletal: Global pelvic floor laxity with associated pessary. Visualized osseous structures are within normal limits. IMPRESSION: No evidence of bowel obstruction. Normal appendix. Scattered colonic diverticulosis, without diverticulitis. Large hiatal hernia/inverted intrathoracic stomach, incompletely visualized. Cholelithiasis with mild gallbladder wall thickening, but without convincing findings to suggest acute cholecystitis on CT. Additional ancillary findings as above. Electronically Signed   By: Julian Hy M.D.   On: 08/27/2021 01:26   US Abdomen Limited RUQ (LIVER/GB)  Result Date:  08/26/2021 CLINICAL DATA:  Right upper quadrant pain EXAM: ULTRASOUND ABDOMEN LIMITED RIGHT UPPER QUADRANT COMPARISON:  05/20/2021 FINDINGS: Gallbladder: 4 mm gallstone within the gallbladder. No wall thickening. Negative sonographic Murphy's. Common bile duct: Diameter: Normal caliber, 4 mm. Liver: No focal lesion identified. Within normal limits in parenchymal echogenicity. Portal vein is patent on color Doppler imaging with normal direction of blood flow towards the liver. Other: None. IMPRESSION: Cholelithiasis.  No sonographic evidence of acute cholecystitis. Electronically Signed   By: Rolm Baptise M.D.   On: 08/26/2021 23:10    Procedures Procedures   Medications Ordered in ED Medications  ondansetron (ZOFRAN-ODT) disintegrating tablet 4 mg (4 mg Oral Given 08/26/21 1530)    ED Course  I have reviewed the triage  vital signs and the nursing notes.  Pertinent labs & imaging results that were available during my care of the patient were reviewed by me and considered in my medical decision making (see chart for details).    MDM Rules/Calculators/A&P                         Patient presents to the ED with complaints of abdominal pain. Nontoxic, vitals unremarkable.    Additional history obtained:  Additional history obtained from chart review & nursing note review.   Lab Tests:  I Ordered, reviewed, and interpreted labs, which included:  CBC, CMP, Lipase Lipase moderately elevated which appears to be baseline for her Liver enzymes moderately elevated, normal t bili  Imaging Studies ordered:  I ordered imaging studies which included RUQ [place], CT abdomen and pelvis without contrast, I independently reviewed, formal radiology impression shows:  RUQ US shows cholelithiasis without cholecystits CT shows no acute findings  ED Course:  Patient presents today for evaluation of abdominal pain. Labs and imaging overall unremarkable. Some elevation in liver enzymes, but in the presence of  normal imaging. Patient is nontoxic, nonseptic appearing, in no apparent distress.  Patient's pain and other symptoms adequately managed in emergency department, and upon reevaluation patient is pain free.  Fluid bolus given.  Labs, imaging and vitals reviewed.  Patient does not meet the SIRS or Sepsis criteria.  On repeat exam patient does not have a surgical abdomin and there are no peritoneal signs.  No indication of appendicitis, bowel obstruction, bowel perforation, cholecystitis, or diverticulitis. Patient discharged home with symptomatic treatment and given strict instructions for follow-up with their primary care physician.  I have also discussed reasons to return immediately to the ER.  Patient expresses understanding and agrees with plan. Patient discharged in stable condition.   This is a shared visit with supervising physician Dr. Johnney Killian who has independently evaluated patient & provided guidance in evaluation/management/disposition, in agreement with care    Portions of this note were generated with Dragon dictation software. Dictation errors may occur despite best attempts at proofreading.   Final Clinical Impression(s) / ED Diagnoses Final diagnoses:  RUQ pain    Rx / DC Orders ED Discharge Orders     None        Nestor Lewandowsky 08/27/21 Harl Bowie    Charlesetta Shanks, MD 08/29/21 1535

## 2021-08-26 NOTE — ED Notes (Signed)
Ultrasound at bedside

## 2021-08-26 NOTE — ED Provider Notes (Signed)
Emergency Medicine Provider Triage Evaluation Note  BRAELYN JENSON , a 75 y.o. female  was evaluated in triage.  Pt complains of nausea, vomiting and abdominal pain that began while she was at church this morning.  History of reflux.  Has not taken her medication for this today.  No history of ulcers.  Does not drink alcohol or use a large amount of NSAIDs.  Review of Systems  Positive: Nausea, vomiting, abdominal pain Negative: Chest pain, shortness of breath or hematemesis  Physical Exam  BP (!) 145/77 (BP Location: Left Arm)   Pulse 83   Temp 98.2 F (36.8 C) (Oral)   Resp 17   Ht 5\' 3"  (1.6 m)   Wt 78 kg   SpO2 97%   BMI 30.47 kg/m  Gen:   Awake, no distress   Resp:  Normal effort  MSK:   Moves extremities without difficulty  Other:  Belly nontender to palpation  Medical Decision Making  Medically screening exam initiated at 3:27 PM.  Appropriate orders placed.  Honestii MANMEET ARZOLA was informed that the remainder of the evaluation will be completed by another provider, this initial triage assessment does not replace that evaluation, and the importance of remaining in the ED until their evaluation is complete.     Darliss Ridgel 08/26/21 1528    Lacretia Leigh, MD 08/26/21 2236

## 2021-08-27 ENCOUNTER — Emergency Department (HOSPITAL_COMMUNITY): Payer: Medicare PPO

## 2021-08-27 ENCOUNTER — Encounter (HOSPITAL_COMMUNITY): Payer: Self-pay

## 2021-08-27 DIAGNOSIS — R109 Unspecified abdominal pain: Secondary | ICD-10-CM | POA: Diagnosis not present

## 2021-08-27 MED ORDER — HYDROCODONE-ACETAMINOPHEN 5-325 MG PO TABS: 1.0000 | ORAL_TABLET | Freq: Four times a day (QID) | ORAL | 0 refills | Status: DC | PRN

## 2021-08-27 MED ORDER — ONDANSETRON 4 MG PO TBDP: 4.0000 mg | ORAL_TABLET | Freq: Three times a day (TID) | ORAL | 0 refills | Status: DC | PRN

## 2021-08-27 MED ORDER — IOHEXOL 350 MG/ML SOLN
80.0000 mL | Freq: Once | INTRAVENOUS | Status: AC | PRN
  Administered 2021-08-27: 80 mL via INTRAVENOUS

## 2021-08-27 NOTE — ED Notes (Signed)
Patient transported to CT 

## 2021-08-27 NOTE — ED Provider Notes (Signed)
Patient signed out to me at shift change.  Patient here with upper abdominal pain.  Ultrasound shows cholelithiasis without evidence of cholecystitis.  Plan at signout is to reassessments after CT imaging.  Lipase mildly elevated at 68, AST 142, ALT 71, normal T bili.  No leukocytosis.  Results for orders placed or performed during the hospital encounter of 08/26/21  Lipase, blood  Result Value Ref Range   Lipase 68 (H) 11 - 51 U/L  Comprehensive metabolic panel  Result Value Ref Range   Sodium 137 135 - 145 mmol/L   Potassium 3.4 (L) 3.5 - 5.1 mmol/L   Chloride 103 98 - 111 mmol/L   CO2 27 22 - 32 mmol/L   Glucose, Bld 169 (H) 70 - 99 mg/dL   BUN 12 8 - 23 mg/dL   Creatinine, Ser 0.60 0.44 - 1.00 mg/dL   Calcium 10.3 8.9 - 10.3 mg/dL   Total Protein 7.8 6.5 - 8.1 g/dL   Albumin 3.5 3.5 - 5.0 g/dL   AST 142 (H) 15 - 41 U/L   ALT 71 (H) 0 - 44 U/L   Alkaline Phosphatase 98 38 - 126 U/L   Total Bilirubin 1.0 0.3 - 1.2 mg/dL   GFR, Estimated >60 >60 mL/min   Anion gap 7 5 - 15  CBC with Differential  Result Value Ref Range   WBC 6.0 4.0 - 10.5 K/uL   RBC 4.03 3.87 - 5.11 MIL/uL   Hemoglobin 13.0 12.0 - 15.0 g/dL   HCT 39.7 36.0 - 46.0 %   MCV 98.5 80.0 - 100.0 fL   MCH 32.3 26.0 - 34.0 pg   MCHC 32.7 30.0 - 36.0 g/dL   RDW 12.6 11.5 - 15.5 %   Platelets 111 (L) 150 - 400 K/uL   nRBC 0.0 0.0 - 0.2 %   Neutrophils Relative % 85 %   Neutro Abs 5.1 1.7 - 7.7 K/uL   Lymphocytes Relative 10 %   Lymphs Abs 0.6 (L) 0.7 - 4.0 K/uL   Monocytes Relative 5 %   Monocytes Absolute 0.3 0.1 - 1.0 K/uL   Eosinophils Relative 0 %   Eosinophils Absolute 0.0 0.0 - 0.5 K/uL   Basophils Relative 0 %   Basophils Absolute 0.0 0.0 - 0.1 K/uL   Immature Granulocytes 0 %   Abs Immature Granulocytes 0.02 0.00 - 0.07 K/uL   CT ABDOMEN PELVIS W CONTRAST  Result Date: 08/27/2021 CLINICAL DATA:  Abdominal pain, nausea/vomiting EXAM: CT ABDOMEN AND PELVIS WITH CONTRAST TECHNIQUE: Multidetector CT  imaging of the abdomen and pelvis was performed using the standard protocol following bolus administration of intravenous contrast. CONTRAST:  3mL OMNIPAQUE IOHEXOL 350 MG/ML SOLN COMPARISON:  Right upper quadrant ultrasound dated 08/26/2021 FINDINGS: Lower chest: Compressive atelectasis in the medial aspect of the bilateral lower lobes. Hepatobiliary: Calcified granuloma in the posterior right hepatic lobe (series 2/image 32). Liver is otherwise within normal limits. Tiny layering gallstones (series 2/image 28). Mild gallbladder wall thickening without gallbladder distension. No intrahepatic or extrahepatic ductal dilatation. Pancreas: Within normal limits. Spleen: Calcified splenic granulomata. Adrenals/Urinary Tract: Adrenal glands are within normal limits. Kidneys are within normal limits.  No hydronephrosis. Bladder is within normal limits. Stomach/Bowel: Large hiatal hernia/inverted intrathoracic stomach, incompletely visualized. No evidence of bowel obstruction. Normal appendix (series 2/image 29). Scattered colonic diverticulosis, without evidence of diverticulitis. Vascular/Lymphatic: No evidence of abdominal aortic aneurysm. Atherosclerotic calcifications of the abdominal aorta and branch vessels. No suspicious abdominopelvic lymphadenopathy. Reproductive: Uterus is within normal limits.  Bilateral ovaries are within normal limits. Other: No abdominopelvic ascites. Musculoskeletal: Global pelvic floor laxity with associated pessary. Visualized osseous structures are within normal limits. IMPRESSION: No evidence of bowel obstruction. Normal appendix. Scattered colonic diverticulosis, without diverticulitis. Large hiatal hernia/inverted intrathoracic stomach, incompletely visualized. Cholelithiasis with mild gallbladder wall thickening, but without convincing findings to suggest acute cholecystitis on CT. Additional ancillary findings as above. Electronically Signed   By: Julian Hy M.D.   On:  08/27/2021 01:26   US Abdomen Limited RUQ (LIVER/GB)  Result Date: 08/26/2021 CLINICAL DATA:  Right upper quadrant pain EXAM: ULTRASOUND ABDOMEN LIMITED RIGHT UPPER QUADRANT COMPARISON:  05/20/2021 FINDINGS: Gallbladder: 4 mm gallstone within the gallbladder. No wall thickening. Negative sonographic Murphy's. Common bile duct: Diameter: Normal caliber, 4 mm. Liver: No focal lesion identified. Within normal limits in parenchymal echogenicity. Portal vein is patent on color Doppler imaging with normal direction of blood flow towards the liver. Other: None. IMPRESSION: Cholelithiasis.  No sonographic evidence of acute cholecystitis. Electronically Signed   By: Rolm Baptise M.D.   On: 08/26/2021 23:10       Montine Circle, PA-C 08/27/21 0157    Mesner, Corene Cornea, MD 08/27/21 [phone]

## 2021-09-05 ENCOUNTER — Other Ambulatory Visit (HOSPITAL_BASED_OUTPATIENT_CLINIC_OR_DEPARTMENT_OTHER): Payer: Self-pay

## 2021-09-05 ENCOUNTER — Ambulatory Visit: Payer: Medicare PPO | Attending: Internal Medicine

## 2021-09-05 DIAGNOSIS — Z23 Encounter for immunization: Secondary | ICD-10-CM

## 2021-09-05 MED ORDER — PFIZER COVID-19 VAC BIVALENT 30 MCG/0.3ML IM SUSP
INTRAMUSCULAR | 0 refills | Status: DC
  Filled 2021-09-05: qty 0.3, 1d supply, fill #0

## 2021-09-05 NOTE — Progress Notes (Signed)
   Covid-19 Vaccination Clinic  Name:  Kathryn Weaver    MRN: [id number] DOB: [birth year]  09/05/2021  Kathryn Weaver was observed post Covid-19 immunization for 15 minutes without incident. She was provided with Vaccine Information Sheet and instruction to access the V-Safe system.   Kathryn Weaver was instructed to call 911 with any severe reactions post vaccine: Difficulty breathing  Swelling of face and throat  A fast heartbeat  A bad rash all over body  Dizziness and weakness   Immunizations Administered     Name Date Dose VIS Date Route   Pfizer Covid-19 Vaccine Bivalent Booster 09/05/2021 10:42 AM 0.3 mL 07/11/2021 Intramuscular   Manufacturer: [place]   Lot: [id number]   [place]: [phone]

## 2021-09-12 DIAGNOSIS — K8020 Calculus of gallbladder without cholecystitis without obstruction: Secondary | ICD-10-CM | POA: Diagnosis not present

## 2021-09-12 DIAGNOSIS — K449 Diaphragmatic hernia without obstruction or gangrene: Secondary | ICD-10-CM | POA: Diagnosis not present

## 2021-09-17 ENCOUNTER — Ambulatory Visit: Payer: Self-pay | Admitting: General Surgery

## 2021-09-17 ENCOUNTER — Encounter (HOSPITAL_COMMUNITY): Payer: Self-pay | Admitting: General Surgery

## 2021-09-17 ENCOUNTER — Other Ambulatory Visit: Payer: Self-pay

## 2021-09-17 DIAGNOSIS — R7401 Elevation of levels of liver transaminase levels: Secondary | ICD-10-CM

## 2021-09-17 NOTE — Progress Notes (Addendum)
Kathryn Weaver denies chest pain or shortness or breath.  Patient denies having any s/s of Covid in her household.  Patient denies any known exposure to Covid.     PCP is Dr Loura Pardon.  Kathryn Weaver has seen a hematologist in the past for low platelets. Patient saw Dr. Johnsie Cancel in  for fatigue, a CT cardiac was done, patient was instructed to follow up if needed.  I instructed patient to shower with antibiotic soap, if it is available.  Dry off with a clean towel. Do not put lotion, powder, cologne or deodorant or makeup.No jewelry or piercings. Men may shave their face and neck. Woman should not shave. No nail polish, artificial or acrylic nails. Wear clean clothes, brush your teeth. Glasses, contact lens,dentures or partials may not be worn in the OR. If you need to wear them, please bring a case for glasses, do not wear contacts or bring a case, the hospital does not have contact cases, dentures or partials will have to be removed , make sure they are clean, we will provide a denture cup to put them in. You will need some one to drive you home and a responsible person over the age of 5 to stay with you for the first 24 hours after surgery.

## 2021-09-17 NOTE — Progress Notes (Signed)
Anesthesia Chart Review:  Case: [id number] Date/Time: 09/18/21 1315   Procedure: LAPAROSCOPIC CHOLECYSTECTOMY WITH INTRAOPERATIVE CHOLANGIOGRAM   Anesthesia type: General   Pre-op diagnosis: SYMPTOMATIC CHOLELITHIASIS   Location: [place] OR ROOM 02 / [place] [facility]   Surgeons: [name], MD       DISCUSSION: Patient is a 75 year female scheduled for the above procedure. ED visit 05/20/21, 08/26/21 for abdominal pain, thought to be symptomatic cholelithiasis with elevated LFTs.   History includes never smoker, HTN, anemia (GI bleed 03/2007), GERD, hiatal hernia, HLD, myelodysplastic disease (IgM MGUS), endometriosis. No CAD by CCTA 2012. A1c results (per PCP) are consistent with pre-diabetes. Mild OSA by 2019 sleep study, and CPAP not recommended.  Last labs currently seen are from 08/26/21. Cr 0.60, glucose 169, H/H 13.0/39.7, PLT 111K (stable), AST 142 (previously 18 06/04/21, 395 05/19/21), and ALT 71 (previously 23 06/04/21, 179 05/19/21), total bilirubin 1.0. Appears Dr. Redmond Pulling has ordered a CMET.   She denied chest pain and SOB per PAT RN phone interview. She is a same day work-up, so anesthesia team evaluation on the day of surgery.   VS: Ht _0  (1.6 m)   Wt 77.6 kg   BMI 30.29 kg/m  BP Readings from Last 3 Encounters:  08/27/21 111/67  06/04/21 108/66  05/20/21 (!) 125/59   Pulse Readings from Last 3 Encounters:  08/27/21 73  06/04/21 (!) 54  05/20/21 75     PROVIDERS: Tower, [name], MD is PCP. Last visit 06/04/21. Elevated LFTs, suspected due to symptomatic gallstones and referred to general surgery. A1c added due to ED glucose > 200 and was 6.2% (previously 6.1%). Notes patient had not followed up with hematology--PLT count stable at 110K, WBC 3.4 then.  - Higgs, Mathis Dad, MD is hematologist. Last visit 09/07/18 for IgM MGUS follow-up with leukopenia and thrombocytopenia.  S/p Bone marrow biopsy 05/26/17, hypercellular bone marrow with trilineage hematopoiesis, minor population of monoclonal  plasma cells, slight lymphocytosis and flow cytometry with predominance of T lymphocytes with no abnormal phenotype, minor B-cell population present.  Jenkins Rouge, MD is cardiologist. Patient seen on 04/29/18 for fatigue in setting of keeping two young great grandchildren and recent diverticulitis flare. Doubted cardiac etiology but had planned echo which was never done. No CAD on CCTA in 2012. As needed cardiology follow-up recommended.     LABS: Labs on day of surgery as indicated. Currently, most recent results include: Lab Results  Component Value Date   WBC 6.0 08/26/2021   HGB 13.0 08/26/2021   HCT 39.7 08/26/2021   PLT 111 (L) 08/26/2021   GLUCOSE 169 (H) 08/26/2021   ALT 71 (H) 08/26/2021   AST 142 (H) 08/26/2021   NA 137 08/26/2021   K 3.4 (L) 08/26/2021   CL 103 08/26/2021   CREATININE 0.60 08/26/2021   BUN 12 08/26/2021   CO2 27 08/26/2021   TSH 3.38 06/04/2021   HGBA1C 6.2 06/04/2021      Sleep Study (NPSG) 05/26/18: IMPRESSIONS - Mild obstructive sleep apnea is documented with this study. The severity does not require positive pressure treatment.    IMAGES: CT Abd/pelvis 08/27/21: IMPRESSION: - No evidence of bowel obstruction. Normal appendix. Scattered colonic diverticulosis, without diverticulitis. - Large hiatal hernia/inverted intrathoracic stomach, incompletely visualized. - Cholelithiasis with mild gallbladder wall thickening, but without convincing findings to suggest acute cholecystitis on CT. - Additional ancillary findings as above. [See full report]    EKG: 05/19/21: Sinus rhythm Biatrial enlargement RSR' in V1 or V2, right  VCD or RVH Minimal ST elevation, anterior leads Confirmed by Thamas Jaegers ([id number]) on 05/19/2021 11:29:04 PM  CV: BLE arterial US 07/05/20: Summary:  - Right: Resting right ankle-brachial index is within normal range. No  evidence of significant right lower extremity arterial disease. The right  toe-brachial index is  normal.  - Left: Resting left ankle-brachial index is within normal range. No  evidence of significant left lower extremity arterial disease. The left  toe-brachial index is normal.   CT Coronary 07/30/11: IMPRESSION:  1.  No coronary artery disease.  The patient's total coronary  artery calcium score is 0, which is 0 percentile for patient's  matched age and gender.  2.  No significant extracardiac findings.  3.  Right-sided coronary artery dominance.     Past Medical History:  Diagnosis Date   Adenomatous polyp of colon 03/2007   Anemia    s/p GI bleed, hospital 5/08   Anxiety    Depression    Diverticulosis    Endometriosis    uterine US 06/2003   GERD (gastroesophageal reflux disease)    Hiatal hernia    EGD 5/08- stricture, HH, gastritis, GERD   History of blood transfusion    Hyperlipidemia    Hypertension    Hypertension    Iron deficiency 03/16/2014   Myelodysplastic disease ([place])     Past Surgical History:  Procedure Laterality Date   cardiac CT  9/12   normal    HAMMER TOE SURGERY Left 12/02/2017   KNEE SURGERY Left    Cyst behind left knee.   TUBAL LIGATION      MEDICATIONS: No current facility-administered medications for this encounter.    atorvastatin (LIPITOR) 10 MG tablet   esomeprazole (NEXIUM) 20 MG capsule   hydrochlorothiazide (HYDRODIURIL) 25 MG tablet   Loteprednol Etabonate (LOTEMAX) 0.5 % OINT   COVID-19 mRNA bivalent vaccine, Pfizer, (PFIZER COVID-19 VAC BIVALENT) injection   HYDROcodone-acetaminophen (NORCO/VICODIN) 5-325 MG tablet   ondansetron (ZOFRAN ODT) 4 MG disintegrating tablet   sertraline (ZOLOFT) 50 MG tablet    Myra Gianotti, PA-C Surgical Short Stay/Anesthesiology [facility] Phone [phone] [facility] Phone [phone] 09/17/2021 1:04 PM

## 2021-09-17 NOTE — Anesthesia Preprocedure Evaluation (Addendum)
Anesthesia Evaluation  Patient identified by MRN, date of birth, ID band Patient awake    Reviewed: Allergy & Precautions, NPO status , Patient's Chart, lab work & pertinent test results  History of Anesthesia Complications Negative for: history of anesthetic complications  Airway Mallampati: II  TM Distance: >3 FB Neck ROM: Full    Dental no notable dental hx.    Pulmonary neg pulmonary ROS,    Pulmonary exam normal        Cardiovascular hypertension, Pt. on medications  Rhythm:Regular Rate:Normal     Neuro/Psych PSYCHIATRIC DISORDERS Anxiety Depression negative neurological ROS     GI/Hepatic hiatal hernia, GERD  ,Lab Results      Component                Value               Date                      ALT                      17                  09/18/2021                AST                      38                  09/18/2021                ALKPHOS                  83                  09/18/2021                BILITOT                  1.4 (H)             09/18/2021              Endo/Other  negative endocrine ROS  Renal/GU Lab Results      Component                Value               Date                      CREATININE               0.87                09/18/2021                Musculoskeletal   Abdominal Normal abdominal exam  (+)   Peds  Hematology Lab Results      Component                Value               Date                      WBC                      6.0  08/26/2021                HGB                      13.0                08/26/2021                HCT                      39.7                08/26/2021                MCV                      98.5                08/26/2021                PLT                      111 (L)             08/26/2021              Anesthesia Other Findings Tower, [name], MD is PCP. Last visit 06/04/21. Elevated LFTs, suspected due to symptomatic  gallstones and referred to general surgery. A1c added due to ED glucose > 200 and was 6.2% (previously 6.1%). Notes patient had not followed up with hematology--PLT count stable at 110K, WBC 3.4 then.  - Higgs, Mathis Dad, MD is hematologist. Last visit 09/07/18 for IgM MGUS follow-up with leukopenia and thrombocytopenia.  S/p Bone marrow biopsy 05/26/17, hypercellular bone marrow with trilineage hematopoiesis, minor population of monoclonal plasma cells, slight lymphocytosis and flow cytometry with predominance of T lymphocytes with no abnormal phenotype, minor B-cell population present.  Jenkins Rouge, MD is cardiologist. Patient seen on 04/29/18 for fatigue in setting of keeping two young great grandchildren and recent diverticulitis flare. Doubted cardiac etiology but had planned echo which was never done. No CAD on CCTA in 2012. As needed cardiology follow-up recommended.    Reproductive/Obstetrics                           Anesthesia Physical Anesthesia Plan  ASA: 2  Anesthesia Plan: General   Post-op Pain Management:    Induction: Intravenous  PONV Risk Score and Plan: 3 and Ondansetron and Dexamethasone  Airway Management Planned: Oral ETT  Additional Equipment: None  Intra-op Plan:   Post-operative Plan: Extubation in OR  Informed Consent: I have reviewed the patients History and Physical, chart, labs and discussed the procedure including the risks, benefits and alternatives for the proposed anesthesia with the patient or authorized representative who has indicated his/her understanding and acceptance.     Dental advisory given  Plan Discussed with: CRNA and Anesthesiologist  Anesthesia Plan Comments: (PAT note written 09/17/2021 by Myra Gianotti, PA-C. )       Anesthesia Quick Evaluation

## 2021-09-18 ENCOUNTER — Other Ambulatory Visit: Payer: Self-pay

## 2021-09-18 ENCOUNTER — Ambulatory Visit (HOSPITAL_COMMUNITY): Payer: Medicare PPO | Admitting: Vascular Surgery

## 2021-09-18 ENCOUNTER — Encounter (HOSPITAL_COMMUNITY): Payer: Self-pay | Admitting: General Surgery

## 2021-09-18 ENCOUNTER — Ambulatory Visit (HOSPITAL_COMMUNITY): Payer: Medicare PPO

## 2021-09-18 ENCOUNTER — Ambulatory Visit (HOSPITAL_COMMUNITY)
Admission: RE | Admit: 2021-09-18 | Discharge: 2021-09-18 | Disposition: A | Discharge status: Home / Self Care | Payer: Medicare PPO | Attending: General Surgery | Admitting: General Surgery

## 2021-09-18 ENCOUNTER — Encounter (HOSPITAL_COMMUNITY)
Admission: RE | Disposition: A | Discharge status: Home / Self Care | Payer: Self-pay | Source: Home / Self Care | Attending: General Surgery

## 2021-09-18 DIAGNOSIS — R7401 Elevation of levels of liver transaminase levels: Secondary | ICD-10-CM

## 2021-09-18 DIAGNOSIS — F32A Depression, unspecified: Secondary | ICD-10-CM | POA: Insufficient documentation

## 2021-09-18 DIAGNOSIS — Z419 Encounter for procedure for purposes other than remedying health state, unspecified: Secondary | ICD-10-CM

## 2021-09-18 DIAGNOSIS — K8010 Calculus of gallbladder with chronic cholecystitis without obstruction: Secondary | ICD-10-CM | POA: Insufficient documentation

## 2021-09-18 DIAGNOSIS — E785 Hyperlipidemia, unspecified: Secondary | ICD-10-CM | POA: Diagnosis not present

## 2021-09-18 DIAGNOSIS — I10 Essential (primary) hypertension: Secondary | ICD-10-CM | POA: Insufficient documentation

## 2021-09-18 DIAGNOSIS — D472 Monoclonal gammopathy: Secondary | ICD-10-CM | POA: Diagnosis not present

## 2021-09-18 DIAGNOSIS — K449 Diaphragmatic hernia without obstruction or gangrene: Secondary | ICD-10-CM | POA: Diagnosis not present

## 2021-09-18 DIAGNOSIS — G4733 Obstructive sleep apnea (adult) (pediatric): Secondary | ICD-10-CM | POA: Insufficient documentation

## 2021-09-18 DIAGNOSIS — K8020 Calculus of gallbladder without cholecystitis without obstruction: Secondary | ICD-10-CM | POA: Diagnosis not present

## 2021-09-18 DIAGNOSIS — Z9049 Acquired absence of other specified parts of digestive tract: Secondary | ICD-10-CM | POA: Diagnosis not present

## 2021-09-18 DIAGNOSIS — K219 Gastro-esophageal reflux disease without esophagitis: Secondary | ICD-10-CM | POA: Diagnosis not present

## 2021-09-18 DIAGNOSIS — F419 Anxiety disorder, unspecified: Secondary | ICD-10-CM | POA: Diagnosis not present

## 2021-09-18 DIAGNOSIS — F418 Other specified anxiety disorders: Secondary | ICD-10-CM | POA: Diagnosis not present

## 2021-09-18 DIAGNOSIS — D649 Anemia, unspecified: Secondary | ICD-10-CM | POA: Diagnosis not present

## 2021-09-18 HISTORY — DX: Myelodysplastic disease, not elsewhere classified: C94.6

## 2021-09-18 HISTORY — DX: Personal history of other medical treatment: Z92.89

## 2021-09-18 HISTORY — PX: CHOLECYSTECTOMY: SHX55

## 2021-09-18 HISTORY — PX: INTRAOPERATIVE CHOLANGIOGRAM: SHX5230

## 2021-09-18 LAB — COMPREHENSIVE METABOLIC PANEL
ALT: 17 U/L (ref 0–44)
AST: 38 U/L (ref 15–41)
Albumin: 3.5 g/dL (ref 3.5–5.0)
Alkaline Phosphatase: 83 U/L (ref 38–126)
Anion gap: 11 (ref 5–15)
BUN: 11 mg/dL (ref 8–23)
CO2: 25 mmol/L (ref 22–32)
Calcium: 10.2 mg/dL (ref 8.9–10.3)
Chloride: 101 mmol/L (ref 98–111)
Creatinine, Ser: 0.87 mg/dL (ref 0.44–1.00)
GFR, Estimated: >60 mL/min (ref >60)
Glucose, Bld: 93 mg/dL (ref 70–99)
Potassium: 4.7 mmol/L (ref 3.5–5.1)
Sodium: 137 mmol/L (ref 135–145)
Total Bilirubin: 1.4 mg/dL — ABNORMAL HIGH (ref 0.3–1.2)
Total Protein: 8.3 g/dL — ABNORMAL HIGH (ref 6.5–8.1)

## 2021-09-18 SURGERY — LAPAROSCOPIC CHOLECYSTECTOMY WITH INTRAOPERATIVE CHOLANGIOGRAM
Anesthesia: General | Site: Abdomen

## 2021-09-18 MED ORDER — SODIUM CHLORIDE 0.9 % IV SOLN
2.0000 g | INTRAVENOUS | Status: AC
  Administered 2021-09-18: 2 g via INTRAVENOUS

## 2021-09-18 MED ORDER — CHLORHEXIDINE GLUCONATE CLOTH 2 % EX PADS: 6.0000 | MEDICATED_PAD | Freq: Once | CUTANEOUS | Status: DC

## 2021-09-18 MED ORDER — SODIUM CHLORIDE 0.9 % IV SOLN
INTRAVENOUS | Status: AC
  Filled 2021-09-18: qty 2

## 2021-09-18 MED ORDER — PROPOFOL 10 MG/ML IV BOLUS
INTRAVENOUS | Status: DC | PRN
  Administered 2021-09-18: 150 mg via INTRAVENOUS

## 2021-09-18 MED ORDER — ACETAMINOPHEN 160 MG/5ML PO SOLN: 1000.0000 mg | Freq: Once | ORAL | Status: DC | PRN

## 2021-09-18 MED ORDER — MIDAZOLAM HCL 5 MG/5ML IJ SOLN
INTRAMUSCULAR | Status: DC | PRN
  Administered 2021-09-18 (×2): 1 mg via INTRAVENOUS

## 2021-09-18 MED ORDER — ONDANSETRON HCL 4 MG/2ML IJ SOLN
INTRAMUSCULAR | Status: AC
  Filled 2021-09-18: qty 2

## 2021-09-18 MED ORDER — ACETAMINOPHEN 500 MG PO TABS: 1000.0000 mg | ORAL_TABLET | Freq: Once | ORAL | Status: DC | PRN

## 2021-09-18 MED ORDER — DEXAMETHASONE SODIUM PHOSPHATE 10 MG/ML IJ SOLN
INTRAMUSCULAR | Status: AC
  Filled 2021-09-18: qty 1

## 2021-09-18 MED ORDER — MIDAZOLAM HCL 2 MG/2ML IJ SOLN
INTRAMUSCULAR | Status: AC
  Filled 2021-09-18: qty 2

## 2021-09-18 MED ORDER — ONDANSETRON HCL 4 MG/2ML IJ SOLN
INTRAMUSCULAR | Status: DC | PRN
  Administered 2021-09-18: 4 mg via INTRAVENOUS

## 2021-09-18 MED ORDER — ACETAMINOPHEN 10 MG/ML IV SOLN: 1000.0000 mg | Freq: Once | INTRAVENOUS | Status: DC | PRN

## 2021-09-18 MED ORDER — CHLORHEXIDINE GLUCONATE 0.12 % MT SOLN: 15.0000 mL | Freq: Once | OROMUCOSAL | Status: AC

## 2021-09-18 MED ORDER — OXYCODONE HCL 5 MG PO TABS: 5.0000 mg | ORAL_TABLET | Freq: Once | ORAL | Status: DC | PRN

## 2021-09-18 MED ORDER — LIDOCAINE 2% (20 MG/ML) 5 ML SYRINGE
INTRAMUSCULAR | Status: DC | PRN
  Administered 2021-09-18: 60 mg via INTRAVENOUS

## 2021-09-18 MED ORDER — CHLORHEXIDINE GLUCONATE 0.12 % MT SOLN
OROMUCOSAL | Status: AC
  Administered 2021-09-18: 15 mL via OROMUCOSAL
  Filled 2021-09-18: qty 15

## 2021-09-18 MED ORDER — ACETAMINOPHEN 500 MG PO TABS
ORAL_TABLET | ORAL | Status: AC
  Administered 2021-09-18: 1000 mg via ORAL
  Filled 2021-09-18: qty 2

## 2021-09-18 MED ORDER — FENTANYL CITRATE (PF) 100 MCG/2ML IJ SOLN: 25.0000 ug | INTRAMUSCULAR | Status: DC | PRN

## 2021-09-18 MED ORDER — ACETAMINOPHEN 500 MG PO TABS: 1000.0000 mg | ORAL_TABLET | Freq: Four times a day (QID) | ORAL | 2 refills | Status: DC | PRN

## 2021-09-18 MED ORDER — KETOROLAC TROMETHAMINE 30 MG/ML IJ SOLN
INTRAMUSCULAR | Status: DC | PRN
  Administered 2021-09-18: 15 mg via INTRAVENOUS

## 2021-09-18 MED ORDER — PROPOFOL 10 MG/ML IV BOLUS
INTRAVENOUS | Status: AC
  Filled 2021-09-18: qty 20

## 2021-09-18 MED ORDER — BUPIVACAINE-EPINEPHRINE 0.25% -1:200000 IJ SOLN
INTRAMUSCULAR | Status: DC | PRN
  Administered 2021-09-18: 16 mL

## 2021-09-18 MED ORDER — 0.9 % SODIUM CHLORIDE (POUR BTL) OPTIME
TOPICAL | Status: DC | PRN
  Administered 2021-09-18: 1000 mL

## 2021-09-18 MED ORDER — SODIUM CHLORIDE 0.9 % IR SOLN
Status: DC | PRN
  Administered 2021-09-18: 1000 mL

## 2021-09-18 MED ORDER — KETOROLAC TROMETHAMINE 30 MG/ML IJ SOLN
INTRAMUSCULAR | Status: AC
  Filled 2021-09-18: qty 1

## 2021-09-18 MED ORDER — ACETAMINOPHEN 500 MG PO TABS: 1000.0000 mg | ORAL_TABLET | ORAL | Status: AC

## 2021-09-18 MED ORDER — SODIUM CHLORIDE 0.9 % IV SOLN
INTRAVENOUS | Status: DC | PRN
  Administered 2021-09-18: 10 mL

## 2021-09-18 MED ORDER — LIDOCAINE 2% (20 MG/ML) 5 ML SYRINGE
INTRAMUSCULAR | Status: AC
  Filled 2021-09-18: qty 5

## 2021-09-18 MED ORDER — OXYCODONE HCL 5 MG PO TABS: 5.0000 mg | ORAL_TABLET | Freq: Four times a day (QID) | ORAL | 0 refills | Status: DC | PRN

## 2021-09-18 MED ORDER — FENTANYL CITRATE (PF) 100 MCG/2ML IJ SOLN
INTRAMUSCULAR | Status: DC | PRN
  Administered 2021-09-18: 25 ug via INTRAVENOUS
  Administered 2021-09-18: 100 ug via INTRAVENOUS

## 2021-09-18 MED ORDER — ROCURONIUM BROMIDE 100 MG/10ML IV SOLN
INTRAVENOUS | Status: DC | PRN
  Administered 2021-09-18: 10 mg via INTRAVENOUS
  Administered 2021-09-18: 50 mg via INTRAVENOUS

## 2021-09-18 MED ORDER — OXYCODONE HCL 5 MG/5ML PO SOLN: 5.0000 mg | Freq: Once | ORAL | Status: DC | PRN

## 2021-09-18 MED ORDER — LACTATED RINGERS IV SOLN: INTRAVENOUS | Status: DC

## 2021-09-18 MED ORDER — BUPIVACAINE-EPINEPHRINE (PF) 0.25% -1:200000 IJ SOLN
INTRAMUSCULAR | Status: AC
  Filled 2021-09-18: qty 30

## 2021-09-18 MED ORDER — FENTANYL CITRATE (PF) 250 MCG/5ML IJ SOLN
INTRAMUSCULAR | Status: AC
  Filled 2021-09-18: qty 5

## 2021-09-18 MED ORDER — PHENYLEPHRINE HCL-NACL 20-0.9 MG/250ML-% IV SOLN
INTRAVENOUS | Status: DC | PRN
  Administered 2021-09-18: 10 ug/min via INTRAVENOUS

## 2021-09-18 MED ORDER — ORAL CARE MOUTH RINSE: 15.0000 mL | Freq: Once | OROMUCOSAL | Status: AC

## 2021-09-18 MED ORDER — DEXAMETHASONE SODIUM PHOSPHATE 10 MG/ML IJ SOLN
INTRAMUSCULAR | Status: DC | PRN
  Administered 2021-09-18: 8 mg via INTRAVENOUS

## 2021-09-18 MED ORDER — SUGAMMADEX SODIUM 200 MG/2ML IV SOLN
INTRAVENOUS | Status: DC | PRN
  Administered 2021-09-18: 150 mg via INTRAVENOUS

## 2021-09-18 MED ORDER — ROCURONIUM BROMIDE 10 MG/ML (PF) SYRINGE
PREFILLED_SYRINGE | INTRAVENOUS | Status: AC
  Filled 2021-09-18: qty 10

## 2021-09-18 SURGICAL SUPPLY — 52 items
ADH SKN CLS APL DERMABOND .7 (GAUZE/BANDAGES/DRESSINGS) ×1
APL PRP STRL LF DISP 70% ISPRP (MISCELLANEOUS) ×1
APPLIER CLIP 5 13 M/L LIGAMAX5 (MISCELLANEOUS) ×2
APR CLP MED LRG 5 ANG JAW (MISCELLANEOUS) ×1
BAG COUNTER SPONGE SURGICOUNT (BAG) ×2 IMPLANT
BAG SPEC RTRVL 10 TROC 200 (ENDOMECHANICALS) ×1
BAG SPNG CNTER NS LX DISP (BAG) ×1
BLADE CLIPPER SURG (BLADE) IMPLANT
CANISTER SUCT 3000ML PPV (MISCELLANEOUS) ×2 IMPLANT
CHLORAPREP W/TINT 26 (MISCELLANEOUS) ×2 IMPLANT
CLIP APPLIE 5 13 M/L LIGAMAX5 (MISCELLANEOUS) ×1 IMPLANT
COVER MAYO STAND STRL (DRAPES) ×2 IMPLANT
COVER SURGICAL LIGHT HANDLE (MISCELLANEOUS) ×2 IMPLANT
DERMABOND ADVANCED (GAUZE/BANDAGES/DRESSINGS) ×1
DERMABOND ADVANCED .7 DNX12 (GAUZE/BANDAGES/DRESSINGS) IMPLANT
DRAPE C-ARM 42X120 X-RAY (DRAPES) ×2 IMPLANT
DRSG TEGADERM 2-3/8X2-3/4 SM (GAUZE/BANDAGES/DRESSINGS) ×4 IMPLANT
DRSG TEGADERM 4X4.75 (GAUZE/BANDAGES/DRESSINGS) ×2 IMPLANT
ELECT REM PT RETURN 9FT ADLT (ELECTROSURGICAL) ×2
ELECTRODE REM PT RTRN 9FT ADLT (ELECTROSURGICAL) ×1 IMPLANT
GAUZE SPONGE 2X2 8PLY STRL LF (GAUZE/BANDAGES/DRESSINGS) ×1 IMPLANT
GLOVE SURG ENC TEXT LTX SZ7.5 (GLOVE) ×2 IMPLANT
GLOVE SURG UNDER LTX SZ8 (GLOVE) ×4 IMPLANT
GOWN STRL REUS W/ TWL LRG LVL3 (GOWN DISPOSABLE) ×3 IMPLANT
GOWN STRL REUS W/TWL 2XL LVL3 (GOWN DISPOSABLE) ×2 IMPLANT
GOWN STRL REUS W/TWL LRG LVL3 (GOWN DISPOSABLE) ×6
GRASPER SUT TROCAR 14GX15 (MISCELLANEOUS) ×2 IMPLANT
KIT BASIN OR (CUSTOM PROCEDURE TRAY) ×2 IMPLANT
KIT TURNOVER KIT B (KITS) ×2 IMPLANT
NS IRRIG 1000ML POUR BTL (IV SOLUTION) ×2 IMPLANT
PAD ARMBOARD 7.5X6 YLW CONV (MISCELLANEOUS) ×2 IMPLANT
POUCH RETRIEVAL ECOSAC 10 (ENDOMECHANICALS) ×1 IMPLANT
POUCH RETRIEVAL ECOSAC 10MM (ENDOMECHANICALS) ×2
SCISSORS LAP 5X35 DISP (ENDOMECHANICALS) ×2 IMPLANT
SET CHOLANGIOGRAPH 5 50 .035 (SET/KITS/TRAYS/PACK) ×2 IMPLANT
SET IRRIG TUBING LAPAROSCOPIC (IRRIGATION / IRRIGATOR) ×2 IMPLANT
SET TUBE SMOKE EVAC HIGH FLOW (TUBING) ×2 IMPLANT
SLEEVE ENDOPATH XCEL 5M (ENDOMECHANICALS) ×4 IMPLANT
SOL ANTI FOG 6CC (MISCELLANEOUS) IMPLANT
SOLUTION ANTI FOG 6CC (MISCELLANEOUS) ×1
SPECIMEN JAR SMALL (MISCELLANEOUS) ×2 IMPLANT
SPONGE GAUZE 2X2 STER 10/PKG (GAUZE/BANDAGES/DRESSINGS) ×1
STRIP CLOSURE SKIN 1/2X4 (GAUZE/BANDAGES/DRESSINGS) ×2 IMPLANT
SUT MNCRL AB 4-0 PS2 18 (SUTURE) ×2 IMPLANT
SUT VIC AB 0 UR5 27 (SUTURE) ×1 IMPLANT
SUT VICRYL 0 UR6 27IN ABS (SUTURE) IMPLANT
TOWEL GREEN STERILE (TOWEL DISPOSABLE) ×2 IMPLANT
TOWEL GREEN STERILE FF (TOWEL DISPOSABLE) ×2 IMPLANT
TRAY LAPAROSCOPIC MC (CUSTOM PROCEDURE TRAY) ×2 IMPLANT
TROCAR XCEL BLUNT TIP 100MML (ENDOMECHANICALS) ×2 IMPLANT
TROCAR XCEL NON-BLD 5MMX100MML (ENDOMECHANICALS) ×2 IMPLANT
WATER STERILE IRR 1000ML POUR (IV SOLUTION) ×2 IMPLANT

## 2021-09-18 NOTE — Op Note (Signed)
BROOKLIN RIEGER [id number] 26-Jun-1946 09/18/2021  Laparoscopic Cholecystectomy with IOC Procedure Note  Indications: This patient presents with symptomatic gallbladder disease and will undergo laparoscopic cholecystectomy.  Pre-operative Diagnosis: Symptomatic cholelithiasis; hiatal hernia  Post-operative Diagnosis: Probable chronic calculus cholecystitis, large hiatal hernia  Surgeon: [name]  MD  Assistants: Ewell Poe, RNFA  Anesthesia: General endotracheal anesthesia  Procedure Details  The patient was seen again in the Holding Room. The risks, benefits, complications, treatment options, and expected outcomes were discussed with the patient. The possibilities of reaction to medication, pulmonary aspiration, perforation of viscus, bleeding, recurrent infection, finding a normal gallbladder, the need for additional procedures, failure to diagnose a condition, the possible need to convert to an open procedure, and creating a complication requiring transfusion or operation were discussed with the patient. The likelihood of improving the patient's symptoms with return to their baseline status is good.  The patient and/or family concurred with the proposed plan, giving informed consent. The site of surgery properly noted. The patient was taken to Operating Room, identified as Early Chars and the procedure verified as Laparoscopic Cholecystectomy with Intraoperative Cholangiogram. A Time Out was held and the above information confirmed. Antibiotic prophylaxis was administered.   Prior to the induction of general anesthesia, antibiotic prophylaxis was administered. General endotracheal anesthesia was then administered and tolerated well. After the induction, the abdomen was prepped with Chloraprep and draped in the sterile fashion. The patient was positioned in the supine position.  Local anesthetic agent was injected into the skin near the umbilicus and an incision made. We dissected down  to the abdominal fascia with blunt dissection.  The fascia was incised vertically and we entered the peritoneal cavity bluntly.  A pursestring suture of 0-Vicryl was placed around the fascial opening.  The Hasson cannula was inserted and secured with the stay suture.  Pneumoperitoneum was then created with CO2 and tolerated well without any adverse changes in the patient's vital signs. An 5-mm port was placed in the subxiphoid position.  Two 5-mm ports were placed in the right upper quadrant. All skin incisions were infiltrated with a local anesthetic agent before making the incision and placing the trocars.   We positioned the patient in reverse Trendelenburg, tilted slightly to the patient's left.  The gallbladder was identified, the fundus grasped and retracted cephalad. Adhesions were lysed bluntly and with the electrocautery where indicated, taking care not to injure any adjacent organs or viscus.  Part of the colon & duodenum were lightly tethered to the infundibulum.  This was carefully peeled away with blunt dissection as well as with and without hook electrocautery.  The infundibulum was grasped and retracted laterally, exposing the peritoneum overlying the triangle of Calot. This was then divided and exposed in a blunt fashion. A critical view of the cystic duct and cystic artery was obtained.  The cystic duct was clearly identified and bluntly dissected circumferentially. The cystic duct was ligated with a clip distally.   An incision was made in the cystic duct and the [facility] cholangiogram catheter introduced. The catheter was secured using a clip. A cholangiogram was then obtained which showed good visualization of the distal and proximal biliary tree with no sign of filling defects or obstruction.  Contrast flowed easily into the duodenum. The catheter was then removed.   The cystic duct was then ligated with clips and divided. The cystic artery which had been identified & dissected free was ligated  with clips and divided as well.   The  gallbladder was dissected from the liver bed in retrograde fashion with the electrocautery.  There was some spillage of bile from the gallbladder as it was being lifted off the liver.  The gallbladder was removed and placed in an Ecco sac.  The gallbladder and Ecco sac were then removed through the umbilical port site. The liver bed was irrigated and inspected. Hemostasis was achieved with the electrocautery. Copious irrigation was utilized and was repeatedly aspirated until clear.  I did inspect the hiatus.  She had a known pre-existing hiatal hernia.  She had a very moderate wide defect at the hiatus.  I would say probably two thirds of her stomach was in her mediastinum.  The pursestring suture was used to close the umbilical fascia.    We again inspected the right upper quadrant for hemostasis.  The umbilical closure was inspected and there was no air leak and nothing trapped within the closure. Pneumoperitoneum was released as we removed the trocars.  4-0 Monocryl was used to close the skin by the RNFA.   Dermabond was applied. The patient was then extubated and brought to the recovery room in stable condition. Instrument, sponge, and needle counts were correct at closure and at the conclusion of the case.   Findings: Probable chronic Cholecystitis with Cholelithiasis Normal IOC Mod to large hiatal hernia  Estimated Blood Loss: Minimal         Drains: None         Specimens: Gallbladder           Complications: None; patient tolerated the procedure well.         Disposition: PACU - hemodynamically stable.         Condition: stable  Leighton Ruff. Redmond Pulling, MD, FACS General, Bariatric, & Minimally Invasive Surgery [facility], [place]

## 2021-09-18 NOTE — Anesthesia Procedure Notes (Signed)
Procedure Name: Intubation Date/Time: 09/18/2021 2:19 PM Performed by: Ezequiel Kayser, CRNA Pre-anesthesia Checklist: Patient identified, Emergency Drugs available, Suction available and Patient being monitored Patient Re-evaluated:Patient Re-evaluated prior to induction Oxygen Delivery Method: Circle System Utilized Preoxygenation: Pre-oxygenation with 100% oxygen Induction Type: IV induction Ventilation: Mask ventilation without difficulty Laryngoscope Size: Mac and 3 Grade View: Grade I Tube type: Oral Tube size: 7.0 mm Number of attempts: 1 Airway Equipment and Method: Stylet and Oral airway Placement Confirmation: ETT inserted through vocal cords under direct vision, positive ETCO2 and breath sounds checked- equal and bilateral Secured at: 22 cm Tube secured with: Tape Dental Injury: Teeth and Oropharynx as per pre-operative assessment

## 2021-09-18 NOTE — Progress Notes (Signed)
Pacu Discharge Note  Patient instuctions were given to family. Wound care, diet, pain, follow up care and how and whom to contact with concerns were discussed. Family aware someone needs to remain with patient overnight and concerns after receiving anesthesia and what to avoid and safety. Answered all questions and concerns.   Discharge paperwork has clear contact informations for surgeon and 24 hour RN line for concerns.   Discussed what concerns to look for including infection and signs/symptoms to look for.   IV was removed prior to discharge. Patient was brought to car with belongings.   Pt exits my care.   

## 2021-09-18 NOTE — H&P (Signed)
DOB: 22-Nov-1945 DATE OF ENCOUNTER: 09/12/2021  Subjective   Chief Complaint: Cholelithiasis   History of Present Illness: Kathryn Weaver is a 75 y.o. female who is seen today as an office consultation at the request of Dr. Glori Bickers for evaluation of Cholelithiasis .   She is accompanied by her husband. She relates 2 instances over the past few months where she had severe pain. The first episode was in July and she points to her lower chest upper abdomen. She felt like there was food stuck there in her chest. She has a known history of a hiatal hernia. She presented to the emergency room and underwent labs and ultrasound. She had a T bili of 1.5 AST of 395 ALT of 179. The most recent episode was in early October. She describes this 1 being in in her abdomen. In the upper abdomen slightly to the right. She had nausea with it. She had a lot of retching with it. The pain did not radiate to her back or shoulder. She does take Nexium as needed for heartburn. She does have early satiety. She denies any difficulty swallowing liquids or solids. This time in the emergency room in October 16 her transaminases were 142 and 71. Normal bilirubin and normal alkaline phosphatase. She denies any cardiac history. She has had a tubal ligation.  (The patient checked a lot of the boxes on her chronic medical conditions that I think are inappropriate in reviewing her chart in [facility] link. I think she got confused by our medical intake form.) Review of Systems: A complete review of systems was obtained from the patient. I have reviewed this information and discussed as appropriate with the patient. See HPI as well for other ROS.  ROS   Medical History: Past Medical History:  Diagnosis Date   Anemia   Anxiety   Arthritis   Asthma, unspecified asthma severity, unspecified whether complicated, unspecified whether persistent   Diabetes mellitus without complication (CMS-HCC)   There is no problem list on file  for this patient.  No past surgical history on file.   No Known Allergies  Current Outpatient Medications on File Prior to Visit  Medication Sig Dispense Refill   hydroCHLOROthiazide (HYDRODIURIL) 25 MG tablet Take 25 mg by mouth once daily   No current facility-administered medications on file prior to visit.   No family history on file.   Social History   Tobacco Use  Smoking Status Never Smoker  Smokeless Tobacco Never Used    Social History   Socioeconomic History   Marital status: Married  Tobacco Use   Smoking status: Never Smoker   Smokeless tobacco: Never Used  Scientific laboratory technician Use: Never used  Substance and Sexual Activity   Alcohol use: Never   Drug use: Never   Objective:   Vitals:  09/12/21 1013  BP: 136/74  Pulse: (!) 112  Temp: 36.8 C (98.2 F)  SpO2: 94%  Weight: 77.9 kg (171 lb 12.8 oz)  Height: 160 cm (5\' 3" )   Body mass index is 30.43 kg/m.  Constitutional: NAD; conversant; no deformities Eyes: Moist conjunctiva; no lid lag; anicteric; PERRL Neck: Trachea midline; no thyromegaly Lungs: Normal respiratory effort; no tactile fremitus CV: RRR; no palpable thrills; no pitting edema GI: Abd soft, nontender, nondistended, old scar at umbilicus; no palpable hepatosplenomegaly MSK: Normal gait; no clubbing/cyanosis Psychiatric: Appropriate affect; alert and oriented x3 Lymphatic: No palpable cervical or axillary lymphadenopathy Skin: No rash or jaundice  Labs, Imaging and  Diagnostic Testing: Reviewed the ER records from July and October. Reviewed her ultrasound and CT scan. She essentially has a intrathoracic stomach  Assessment and Plan:  Diagnoses and all orders for this visit:  Symptomatic cholelithiasis  Hiatal hernia    I think the episode that caused her to go to the emergency room in October was gallbladder related since her AST and ALT were elevated. I think perhaps a component of her complaints in July were also gallbladder  related since her T bili and transaminases were also elevated during that event as well. It is also possible component of her symptoms in July was due to her intrathoracic stomach. Nonetheless I think the most recent event was more gallbladder related.  I believe the patient's symptoms are consistent with gallbladder disease.  We discussed gallbladder disease. The patient was given Neurosurgeon. We discussed non-operative and operative management. We discussed the signs & symptoms of acute cholecystitis  I discussed laparoscopic cholecystectomy with possible IOC in detail. The patient was given educational material as well as diagrams detailing the procedure. We discussed the risks and benefits of a laparoscopic cholecystectomy including, but not limited to bleeding, infection, injury to surrounding structures such as the intestine or liver, bile leak, retained gallstones, need to convert to an open procedure, prolonged diarrhea, blood clots such as DVT, common bile duct injury, anesthesia risks, and possible need for additional procedures. We discussed the typical post-operative recovery course. I explained that the likelihood of improvement of their symptoms is good.  I did discuss that she will still have some ongoing foregut symptoms probably such as early satiety. I think her intrathoracic stomach hiatal hernia has been there for years and I do not think that the 2 episodes that prompted to go to the emergency room were due to a gastric volvulus state per se. My recommendation was to take care of the gallbladder first and then we can see how she responds after that  This patient encounter took 45 minutes today to perform the following: take history, perform exam, review outside records, interpret imaging, counsel the patient on their diagnosis and document encounter, findings & plan in the EHR  No follow-ups on file.  Constantin Hillery Leanne Chang, MD  General, Minimally Invasive, & Bariatric  Surgery

## 2021-09-18 NOTE — Discharge Instructions (Signed)
[facility] LAPAROSCOPIC SURGERY: POST OP INSTRUCTIONS Always review your discharge instruction sheet given to you by the facility where your surgery was performed. IF YOU HAVE DISABILITY OR FAMILY LEAVE FORMS, YOU MUST BRING THEM TO THE OFFICE FOR PROCESSING.   DO NOT GIVE THEM TO YOUR DOCTOR.  PAIN CONTROL  First take acetaminophen (Tylenol) AND/or ibuprofen (Advil) to control your pain after surgery.  Follow directions on package.  Taking acetaminophen (Tylenol) and/or ibuprofen (Advil) regularly after surgery will help to control your pain and lower the amount of prescription pain medication you may need.  You should not take more than 3,000 mg (3 grams) of acetaminophen (Tylenol) in 24 hours.  You should not take ibuprofen (Advil), aleve, motrin, naprosyn or other NSAIDS if you have a history of stomach ulcers or chronic kidney disease.  A prescription for pain medication may be given to you upon discharge.  Take your pain medication as prescribed, if you still have uncontrolled pain after taking acetaminophen (Tylenol) or ibuprofen (Advil). Use ice packs to help control pain. If you need a refill on your pain medication, please contact your pharmacy.  They will contact our office to request authorization. Prescriptions will not be filled after 5pm or on week-ends.  HOME MEDICATIONS Take your usually prescribed medications unless otherwise directed.  DIET You should follow a light diet the first few days after arrival home.  Be sure to include lots of fluids daily. Avoid fatty, fried foods.   CONSTIPATION It is common to experience some constipation after surgery and if you are taking pain medication.  Increasing fluid intake and taking a stool softener (such as Colace) will usually help or prevent this problem from occurring.  A mild laxative (Milk of Magnesia or Miralax) should be taken according to package instructions if there are no bowel movements after 48  hours.  WOUND/INCISION CARE Most patients will experience some swelling and bruising in the area of the incisions.  Ice packs will help.  Swelling and bruising can take several days to resolve.  Unless discharge instructions indicate otherwise, follow guidelines below  STERI-STRIPS - you may remove your outer bandages 48 hours after surgery, and you may shower at that time.  You have steri-strips (small skin tapes) in place directly over the incision.  These strips should be left on the skin for 7-10 days.   DERMABOND/SKIN GLUE - you may shower in 24 hours.  The glue will flake off over the next 2-3 weeks. Any sutures or staples will be removed at the office during your follow-up visit.  ACTIVITIES You may resume regular (light) daily activities beginning the next day--such as daily self-care, walking, climbing stairs--gradually increasing activities as tolerated.  You may have sexual intercourse when it is comfortable.  Refrain from any heavy lifting or straining until approved by your doctor. You may drive when you are no longer taking prescription pain medication, you can comfortably wear a seatbelt, and you can safely maneuver your car and apply brakes.  FOLLOW-UP You should see your doctor in the office for a follow-up appointment approximately 2-3 weeks after your surgery.  You should have been given your post-op/follow-up appointment when your surgery was scheduled.  If you did not receive a post-op/follow-up appointment, make sure that you call for this appointment within a day or two after you arrive home to insure a convenient appointment time.  OTHER INSTRUCTIONS   WHEN TO CALL YOUR DOCTOR: Fever over 101.0 Inability to urinate Continued   bleeding from incision. Increased pain, redness, or drainage from the incision. Increasing abdominal pain  The clinic staff is available to answer your questions during regular business hours.  Please don't hesitate to call and ask to speak to  one of the nurses for clinical concerns.  If you have a medical emergency, go to the nearest emergency room or call 911.  A surgeon from [facility] is always on call at the hospital. [address], [address], [place], [place]  [address] ? [address] [address], [place], [place]   [address] [phone] ? [phone] ? FAX [phone] Web site: [email or web address]  

## 2021-09-18 NOTE — Transfer of Care (Signed)
Immediate Anesthesia Transfer of Care Note  Patient: Kathryn Weaver  Procedure(s) Performed: LAPAROSCOPIC CHOLECYSTECTOMY (Abdomen) INTRAOPERATIVE CHOLANGIOGRAM (Abdomen)  Patient Location: PACU  Anesthesia Type:General  Level of Consciousness: awake, drowsy and patient cooperative  Airway & Oxygen Therapy: Patient Spontanous Breathing  Post-op Assessment: Report given to RN, Post -op Vital signs reviewed and stable and Patient moving all extremities  Post vital signs: Reviewed and stable  Last Vitals:  Vitals Value Taken Time  BP 154/75 09/18/21 1541  Temp    Pulse 60 09/18/21 1541  Resp    SpO2 98 % 09/18/21 1541  Vitals shown include unvalidated device data.  Last Pain:  Vitals:   09/18/21 1121  TempSrc:   PainSc: 0-No pain         Complications: No notable events documented.

## 2021-09-18 NOTE — Interval H&P Note (Signed)
History and Physical Interval Note:  09/18/2021 1:00 PM  Kathryn Weaver  has presented today for surgery, with the diagnosis of SYMPTOMATIC CHOLELITHIASIS.  The various methods of treatment have been discussed with the patient and family. After consideration of risks, benefits and other options for treatment, the patient has consented to  Procedure(s): LAPAROSCOPIC CHOLECYSTECTOMY WITH INTRAOPERATIVE CHOLANGIOGRAM (N/A) as a surgical intervention.  The patient's history has been reviewed, patient examined, no change in status, stable for surgery.  I have reviewed the patient's chart and labs.  Questions were answered to the patient's satisfaction.     [name]

## 2021-09-19 ENCOUNTER — Encounter (HOSPITAL_COMMUNITY): Payer: Self-pay | Admitting: General Surgery

## 2021-09-19 LAB — SURGICAL PATHOLOGY

## 2021-09-19 NOTE — Anesthesia Postprocedure Evaluation (Signed)
Anesthesia Post Note  Patient: Kathryn Weaver  Procedure(s) Performed: LAPAROSCOPIC CHOLECYSTECTOMY (Abdomen) INTRAOPERATIVE CHOLANGIOGRAM (Abdomen)     Patient location during evaluation: PACU Anesthesia Type: General Level of consciousness: awake and alert Pain management: pain level controlled Vital Signs Assessment: post-procedure vital signs reviewed and stable Respiratory status: spontaneous breathing, nonlabored ventilation, respiratory function stable and patient connected to nasal cannula oxygen Cardiovascular status: blood pressure returned to baseline and stable Postop Assessment: no apparent nausea or vomiting Anesthetic complications: no   No notable events documented.  Last Vitals:  Vitals:   09/18/21 1615 09/18/21 1620  BP: (!) 148/58   Pulse: (!) 48 82  Resp: 18 15  Temp: (!) 36.1 C   SpO2: 100% 98%    Last Pain:  Vitals:   09/18/21 1615  TempSrc:   PainSc: 0-No pain                 Belenda Cruise P Jaida Basurto

## 2021-10-25 NOTE — Progress Notes (Deleted)
Subjective:   Kathryn Weaver is a 75 y.o. female who presents for Medicare Annual (Subsequent) preventive examination.  I connected with Marvis Moeller today by telephone and verified that I am speaking with the correct person using two identifiers. Location patient: home Location provider: work Persons participating in the virtual visit: patient, Marine scientist.    I discussed the limitations, risks, security and privacy concerns of performing an evaluation and management service by telephone and the availability of in person appointments. I also discussed with the patient that there may be a patient responsible charge related to this service. The patient expressed understanding and verbally consented to this telephonic visit.    Interactive audio and video telecommunications were attempted between this provider and patient, however failed, due to patient having technical difficulties OR patient did not have access to video capability.  We continued and completed visit with audio only.  Some vital signs may be absent or patient reported.   Time Spent with patient on telephone encounter: *** minutes  Review of Systems           Objective:    There were no vitals filed for this visit. There is no height or weight on file to calculate BMI.  Advanced Directives 09/18/2021 08/26/2021 05/19/2021 10/25/2020 08/24/2019 09/07/2018 04/01/2018  Does Patient Have a Medical Advance Directive? No No No No No No No  Type of Advance Directive - - - - - - -  Copy of Healthcare Power of Attorney in Chart? - - - - - - -  Would patient like information on creating a medical advance directive? No - Patient declined Yes (ED - Information included in AVS) No - Patient declined No - Patient declined No - Patient declined No - Patient declined No - Patient declined    Current Medications (verified) Outpatient Encounter Medications as of 10/29/2021  Medication Sig   acetaminophen (TYLENOL) 500 MG tablet Take 2  tablets (1,000 mg total) by mouth every 6 (six) hours as needed.   atorvastatin (LIPITOR) 10 MG tablet Take 1 tablet (10 mg total) by mouth daily.   esomeprazole (NEXIUM) 20 MG capsule Take 20 mg by mouth daily as needed (indigestion/heartburn).   hydrochlorothiazide (HYDRODIURIL) 25 MG tablet Take 1 tablet (25 mg total) by mouth daily.   Loteprednol Etabonate (LOTEMAX) 0.5 % OINT Place 1 application into both eyes every evening.   oxyCODONE (OXY IR/ROXICODONE) 5 MG immediate release tablet Take 1 tablet (5 mg total) by mouth every 6 (six) hours as needed for severe pain.   No facility-administered encounter medications on file as of 10/29/2021.    Allergies (verified) Patient has no known allergies.   History: Past Medical History:  Diagnosis Date   Adenomatous polyp of colon 03/2007   Anemia    s/p GI bleed, hospital 5/08   Anxiety    Depression    Diverticulosis    Endometriosis    uterine US 06/2003   GERD (gastroesophageal reflux disease)    Hiatal hernia    EGD 5/08- stricture, HH, gastritis, GERD   History of blood transfusion    Hyperlipidemia    Hypertension    Hypertension    Iron deficiency 03/16/2014   Myelodysplastic disease ([place])    Past Surgical History:  Procedure Laterality Date   cardiac CT  9/12   normal    CHOLECYSTECTOMY N/A 09/18/2021   Procedure: LAPAROSCOPIC CHOLECYSTECTOMY;  Surgeon: [name], MD;  Location: [facility];  Service: General;  Laterality: N/A;  HAMMER TOE SURGERY Left 12/02/2017   INTRAOPERATIVE CHOLANGIOGRAM N/A 09/18/2021   Procedure: INTRAOPERATIVE CHOLANGIOGRAM;  Surgeon: [name], MD;  Location: [facility];  Service: General;  Laterality: N/A;   KNEE SURGERY Left    Cyst behind left knee.   TUBAL LIGATION     Family History  Problem Relation Age of Onset   Cancer Father        unknown type   Hypertension Mother    Colon cancer Neg Hx    Esophageal cancer Neg Hx    Rectal cancer Neg Hx    Stomach cancer Neg Hx    Social  History   Socioeconomic History   Marital status: Married    Spouse name: Not on file   Number of children: Not on file   Years of education: Not on file   Highest education level: Not on file  Occupational History   Occupation: [employer]  Tobacco Use   Smoking status: Never   Smokeless tobacco: Never  Vaping Use   Vaping Use: Never used  Substance and Sexual Activity   Alcohol use: No    Alcohol/week: 0.0 standard drinks   Drug use: No   Sexual activity: Yes  Other Topics Concern   Not on file  Social History Narrative   Not on file   Social Determinants of Health   Financial Resource Strain: Low Risk    Difficulty of Paying Living Expenses: Not hard at all  Food Insecurity: No Food Insecurity   Worried About Charity fundraiser in the Last Year: Never true   [place] in the Last Year: Never true  Transportation Needs: No Transportation Needs   Lack of Transportation (Medical): No   Lack of Transportation (Non-Medical): No  Physical Activity: Inactive   Days of Exercise per Week: 0 days   Minutes of Exercise per Session: 0 min  Stress: No Stress Concern Present   Feeling of Stress : Not at all  Social Connections: Not on file    Tobacco Counseling Counseling given: Not Answered   Clinical Intake:                 Diabetic? no         Activities of Daily Living In your present state of health, do you have any difficulty performing the following activities: 09/18/2021 09/18/2021  Hearing? - N  Vision? - N  Difficulty concentrating or making decisions? - N  Walking or climbing stairs? - N  Dressing or bathing? - N  Doing errands, shopping? N -  Preparing Food and eating ? - -  Using the Toilet? - -  In the past six months, have you accidently leaked urine? - -  Do you have problems with loss of bowel control? - -  Managing your Medications? - -  Managing your Finances? - -  Housekeeping or managing your Housekeeping? - -   Some recent data might be hidden    Patient Care Team: Tower, [name], MD as PCP - General Josue Hector, MD as PCP - Cardiology (Cardiology)  Indicate any recent Medical Services you may have received from other than [facility] providers in the past year (date may be approximate).     Assessment:   This is a routine wellness examination for Merilynn.  Hearing/Vision screen No results found.  Dietary issues and exercise activities discussed:     Goals Addressed   None    Depression Screen PHQ 2/9 Scores  10/25/2020 11/30/2019 08/24/2019 01/28/2018 11/26/2016 10/18/2015 03/01/2014  PHQ - 2 Score 0 0 0 0 0 0 0  PHQ- 9 Score 0 - 0 0 - - -    Fall Risk Fall Risk  10/25/2020 08/24/2019 01/28/2018 11/26/2016 10/18/2015  Falls in the past year? 0 0 No No No  Number falls in past yr: 0 - - - -  Injury with Fall? 0 - - - -  Risk for fall due to : Medication side effect Medication side effect - - -  Follow up Falls evaluation completed;Falls prevention discussed Falls evaluation completed;Falls prevention discussed - - -    FALL RISK PREVENTION PERTAINING TO THE HOME:  Any stairs in or around the home? {YES/NO:21197} If so, are there any without handrails? {YES/NO:21197} Home free of loose throw rugs in walkways, pet beds, electrical cords, etc? {YES/NO:21197} Adequate lighting in your home to reduce risk of falls? {YES/NO:21197}  ASSISTIVE DEVICES UTILIZED TO PREVENT FALLS:  Life alert? {YES/NO:21197} Use of a cane, walker or w/c? {YES/NO:21197} Grab bars in the bathroom? {YES/NO:21197} Shower chair or bench in shower? {YES/NO:21197} Elevated toilet seat or a handicapped toilet? {YES/NO:21197}  TIMED UP AND GO:  Was the test performed? No , visit completed over the phone.    Cognitive Function: MMSE - Mini Mental State Exam 10/25/2020 08/24/2019 01/28/2018 11/26/2016  Orientation to time 5 5 5 5   Orientation to Place 5 5 5 5   Registration 3 3 3 3   Attention/ Calculation 5 5 0 0   Recall 3 3 3 3   Language- name 2 objects - - 0 0  Language- repeat 1 1 1 1   Language- follow 3 step command - - 3 3  Language- read & follow direction - - 0 0  Write a sentence - - 0 0  Copy design - - 0 0  Total score - - 20 20        Immunizations Immunization History  Administered Date(s) Administered   Fluad Quad(high Dose 65+) 08/31/2019   Influenza,inj,Quad PF,6+ Mos 10/18/2015, 08/28/2016, 02/18/2018   Influenza-Unspecified 09/11/2018   PFIZER Comirnaty(Gray Top)Covid-19 Tri-Sucrose Vaccine 04/30/2021   PFIZER(Purple Top)SARS-COV-2 Vaccination 12/15/2019, 01/07/2020, 08/26/2020   Pfizer Covid-19 Vaccine Bivalent Booster 16yrs & up 09/05/2021   Pneumococcal Conjugate-13 10/18/2015   Pneumococcal Polysaccharide-23 03/01/2014   Td 03/26/1996, 08/09/2008    {TDAP status:2101805}  {Flu Vaccine status:2101806}  Pneumococcal vaccine status: Up to date  Covid-19 vaccine status: Completed vaccines  Qualifies for Shingles Vaccine? Yes   Zostavax completed No   {Shingrix Completed?:2101804}  Screening Tests Health Maintenance  Topic Date Due   Zoster Vaccines- Shingrix (1 of 2) Never done   INFLUENZA VACCINE  06/11/2021   TETANUS/TDAP  10/25/2024 (Originally 08/09/2018)   MAMMOGRAM  02/26/2022   COLONOSCOPY (Pts 45-62yrs Insurance coverage will need to be confirmed)  08/19/2023   Pneumonia Vaccine 29+ Years old  Completed   DEXA SCAN  Completed   COVID-19 Vaccine  Completed   Hepatitis C Screening  Completed   HPV VACCINES  Aged Out    Health Maintenance  Health Maintenance Due  Topic Date Due   Zoster Vaccines- Shingrix (1 of 2) Never done   INFLUENZA VACCINE  06/11/2021    Colorectal cancer screening: Type of screening: Colonoscopy. Completed 08/18/18. Repeat every 5 years  Mammogram status: Completed 02/26/21. Repeat every year  Bone Density status: Completed 02/01/20. Results reflect: Bone density results: OSTEOPOROSIS. Repeat every 2 years.  Lung  Cancer Screening: (Low Dose CT Chest  recommended if Age 42-80 years, 27 pack-year currently smoking OR have quit w/in 15years.) does not qualify.     Additional Screening:  Hepatitis C Screening: does qualify; Completed 01/10/14  Vision Screening: Recommended annual ophthalmology exams for early detection of glaucoma and other disorders of the eye. Is the patient up to date with their annual eye exam?  {YES/NO:21197} Who is the provider or what is the name of the office in which the patient attends annual eye exams? *** If pt is not established with a provider, would they like to be referred to a provider to establish care? {YES/NO:21197}.   Dental Screening: Recommended annual dental exams for proper oral hygiene  Community Resource Referral / Chronic Care Management: CRR required this visit?  {YES/NO:21197}  CCM required this visit?  {YES/NO:21197}     Plan:     I have personally reviewed and noted the following in the patients chart:   Medical and social history Use of alcohol, tobacco or illicit drugs  Current medications and supplements including opioid prescriptions.  Functional ability and status Nutritional status Physical activity Advanced directives List of other physicians Hospitalizations, surgeries, and ER visits in previous 12 months Vitals Screenings to include cognitive, depression, and falls Referrals and appointments  In addition, I have reviewed and discussed with patient certain preventive protocols, quality metrics, and best practice recommendations. A written personalized care plan for preventive services as well as general preventive health recommendations were provided to patient.   Due to this being a telephonic visit, the after visit summary with patients personalized plan was offered to patient via mail or my-chart. ***Patient declined at this time./ Patient would like to access on my-chart/ per request, patient was mailed a copy of AVS./ Patient  preferred to pick up at office at next visit.   Loma Messing, LPN   [id number]   Nurse Health Advisor  Nurse Notes: none

## 2021-10-26 ENCOUNTER — Ambulatory Visit: Payer: Medicare PPO

## 2021-10-29 ENCOUNTER — Other Ambulatory Visit: Payer: Self-pay | Admitting: Family Medicine

## 2021-10-29 ENCOUNTER — Ambulatory Visit: Payer: Medicare PPO

## 2021-11-18 DIAGNOSIS — B029 Zoster without complications: Secondary | ICD-10-CM | POA: Diagnosis not present

## 2021-11-23 DIAGNOSIS — H68002 Unspecified Eustachian salpingitis, left ear: Secondary | ICD-10-CM | POA: Diagnosis not present

## 2021-12-12 ENCOUNTER — Encounter: Payer: Self-pay | Admitting: Family

## 2021-12-12 ENCOUNTER — Other Ambulatory Visit: Payer: Self-pay

## 2021-12-12 ENCOUNTER — Ambulatory Visit: Payer: Medicare PPO | Admitting: Family

## 2021-12-12 VITALS — BP 128/82 | HR 85 | Temp 97.1°F | Ht 63.0 in | Wt 164.0 lb

## 2021-12-12 DIAGNOSIS — B0229 Other postherpetic nervous system involvement: Secondary | ICD-10-CM

## 2021-12-12 MED ORDER — GABAPENTIN 300 MG PO CAPS: 300.0000 mg | ORAL_CAPSULE | Freq: Three times a day (TID) | ORAL | 0 refills | Status: DC

## 2021-12-12 NOTE — Patient Instructions (Addendum)
Start gabapentin with this regimen:   300 mg on day 1 300 mg twice daily on day 2 300 mg three times daily on day 3  If you end up having relief with just once daily or twice daily, please feel free to stay in that regimen. Follow up in ten days and we will re-discuss how you are doing  . It was a pleasure seeing you today! Please do not hesitate to reach out with any questions and or concerns.  Regards,   Eugenia Pancoast FNP-C

## 2021-12-12 NOTE — Assessment & Plan Note (Addendum)
Shingles rash healing nicely' however now with PHN. Treating today. Continue with calamine lotion as helping with itch. F/u if s/s infection explained to pt to includes increased redness, drainage from site, spreading rash, blistering of rash. rx given for pt, rx gabapentin 300 mg , pt explained how to titrate : 300 mg on day 1, twice daily day 2, and three times daily on day 3. Advised pt that if she is having relief at once or twice daily no need to increase to TID. Also informed pt she may experience some fatigue with this medication.

## 2021-12-12 NOTE — Progress Notes (Signed)
Established Patient Office Visit  Subjective:  Patient ID: Kathryn Weaver, female    DOB: [birth year]  Age: 76 y.o. MRN: [id number]  CC:  Chief Complaint  Patient presents with   Follow-up    From shingles 3 wks ago     HPI Kathryn Weaver is here today for follow up .   Three weeks ago her husband saw a 'scratch' on her back , went to urgent care and diagnosed with shingles. She states now she is tingling inside of her and very uncomfortable, unable to get any rest. Not itching. Still with dry rash, but healing over. Completed valtrex TID x 7 days. Goes along right posterior breast to right mid to upper back.   No fever no chills.  Taking tylenol 1000 mg twice daily. Doesn't really help.   Past Medical History:  Diagnosis Date   Adenomatous polyp of colon 03/2007   Anemia    s/p GI bleed, hospital 5/08   Anxiety    Depression    Diverticulosis    Endometriosis    uterine US 06/2003   GERD (gastroesophageal reflux disease)    Hiatal hernia    EGD 5/08- stricture, HH, gastritis, GERD   History of blood transfusion    Hyperlipidemia    Hypertension    Hypertension    Iron deficiency 03/16/2014   Myelodysplastic disease ([place])     Past Surgical History:  Procedure Laterality Date   cardiac CT  9/12   normal    CHOLECYSTECTOMY N/A 09/18/2021   Procedure: LAPAROSCOPIC CHOLECYSTECTOMY;  Surgeon: [name], MD;  Location: [facility];  Service: General;  Laterality: N/A;   HAMMER TOE SURGERY Left 12/02/2017   INTRAOPERATIVE CHOLANGIOGRAM N/A 09/18/2021   Procedure: INTRAOPERATIVE CHOLANGIOGRAM;  Surgeon: [name], MD;  Location: [facility];  Service: General;  Laterality: N/A;   KNEE SURGERY Left    Cyst behind left knee.   TUBAL LIGATION      Family History  Problem Relation Age of Onset   Cancer Father        unknown type   Hypertension Mother    Colon cancer Neg Hx    Esophageal cancer Neg Hx    Rectal cancer Neg Hx    Stomach cancer Neg Hx     Social History    Socioeconomic History   Marital status: Married    Spouse name: Not on file   Number of children: Not on file   Years of education: Not on file   Highest education level: Not on file  Occupational History   Occupation: [employer]  Tobacco Use   Smoking status: Never   Smokeless tobacco: Never  Vaping Use   Vaping Use: Never used  Substance and Sexual Activity   Alcohol use: No    Alcohol/week: 0.0 standard drinks   Drug use: No   Sexual activity: Yes  Other Topics Concern   Not on file  Social History Narrative   Not on file   Social Determinants of Health   Financial Resource Strain: Not on file  Food Insecurity: Not on file  Transportation Needs: Not on file  Physical Activity: Not on file  Stress: Not on file  Social Connections: Not on file  Intimate Partner Violence: Not on file    Outpatient Medications Prior to Visit  Medication Sig Dispense Refill   acetaminophen (TYLENOL) 500 MG tablet Take 2 tablets (1,000 mg total) by mouth every 6 (six) hours as needed. 100 tablet  2   atorvastatin (LIPITOR) 10 MG tablet TAKE 1 TABLET BY MOUTH EVERY DAY 90 tablet 1   hydrochlorothiazide (HYDRODIURIL) 25 MG tablet TAKE 1 TABLET (25 MG TOTAL) BY MOUTH DAILY. 90 tablet 1   oxyCODONE (OXY IR/ROXICODONE) 5 MG immediate release tablet Take 1 tablet (5 mg total) by mouth every 6 (six) hours as needed for severe pain. 15 tablet 0   esomeprazole (NEXIUM) 20 MG capsule Take 20 mg by mouth daily as needed (indigestion/heartburn).     Loteprednol Etabonate (LOTEMAX) 0.5 % OINT Place 1 application into both eyes every evening.     No facility-administered medications prior to visit.    No Known Allergies  ROS Review of Systems  Skin:  Positive for rash (shingles healing rash, with mild erythema along right inferior breast extending to right mid/upper back, healing scaled over lesions).  Neurological:  Positive for numbness (tingling along shingles rash/ right inferior  breast as well as mid to upper right back, pain burning/tingling not itchy).     Objective:    Physical Exam Constitutional:      General: She is not in acute distress.    Appearance: Normal appearance. She is normal weight. She is not ill-appearing, toxic-appearing or diaphoretic.  Skin:    General: Skin is warm.     Findings: Lesion (dry scaling lesions scattered along right dermatone with mild base erythema. no drainage. along right inferior breast to right mid upper back) and rash present.  Neurological:     Mental Status: She is alert.        BP 128/82    Pulse 85    Temp (!) 97.1 F (36.2 C) (Temporal)    Ht 5\' 3"  (1.6 m)    Wt 164 lb (74.4 kg)    SpO2 97%    BMI 29.05 kg/m  Wt Readings from Last 3 Encounters:  12/12/21 164 lb (74.4 kg)  09/18/21 171 lb (77.6 kg)  08/26/21 172 lb (78 kg)     Health Maintenance Due  Topic Date Due   Zoster Vaccines- Shingrix (1 of 2) Never done   INFLUENZA VACCINE  06/11/2021    There are no preventive care reminders to display for this patient.  Lab Results  Component Value Date   TSH 3.38 06/04/2021   Lab Results  Component Value Date   WBC 6.0 08/26/2021   HGB 13.0 08/26/2021   HCT 39.7 08/26/2021   MCV 98.5 08/26/2021   PLT 111 (L) 08/26/2021   Lab Results  Component Value Date   NA 137 09/18/2021   K 4.7 09/18/2021   CO2 25 09/18/2021   GLUCOSE 93 09/18/2021   BUN 11 09/18/2021   CREATININE 0.87 09/18/2021   BILITOT 1.4 (H) 09/18/2021   ALKPHOS 83 09/18/2021   AST 38 09/18/2021   ALT 17 09/18/2021   PROT 8.3 (H) 09/18/2021   ALBUMIN 3.5 09/18/2021   CALCIUM 10.2 09/18/2021   ANIONGAP 11 09/18/2021   GFR 67.67 06/04/2021   Lab Results  Component Value Date   HGBA1C 6.2 06/04/2021      Assessment & Plan:   Problem List Items Addressed This Visit       Nervous and Auditory   Post herpetic neuralgia - Primary   Relevant Medications   gabapentin (NEURONTIN) 300 MG capsule     Other   Other  postherpetic nervous system involvement    Shingles rash healing nicely' however now with PHN. Treating today. Continue with calamine lotion as helping  with itch. F/u if s/s infection explained to pt to includes increased redness, drainage from site, spreading rash, blistering of rash. rx given for pt, rx gabapentin 300 mg , pt explained how to titrate : 300 mg on day 1, twice daily day 2, and three times daily on day 3. Advised pt that if she is having relief at once or twice daily no need to increase to TID. Also informed pt she may experience some fatigue with this medication.        Meds ordered this encounter  Medications   gabapentin (NEURONTIN) 300 MG capsule    Sig: Take 1 capsule (300 mg total) by mouth 3 (three) times daily.    Dispense:  90 capsule    Refill:  0    Order Specific Question:   Supervising Provider    Answer:   Diona Browner, AMY E [[id number]    Follow-up: Return in about 10 days (around 12/22/2021).    Eugenia Pancoast, FNP

## 2021-12-24 ENCOUNTER — Ambulatory Visit: Payer: Medicare PPO | Admitting: Family Medicine

## 2021-12-24 ENCOUNTER — Encounter: Payer: Self-pay | Admitting: Family Medicine

## 2021-12-24 ENCOUNTER — Other Ambulatory Visit: Payer: Self-pay

## 2021-12-24 VITALS — BP 118/78 | HR 78 | Temp 96.2°F | Ht 62.5 in | Wt 168.0 lb

## 2021-12-24 DIAGNOSIS — B0229 Other postherpetic nervous system involvement: Secondary | ICD-10-CM | POA: Diagnosis not present

## 2021-12-24 NOTE — Patient Instructions (Signed)
Take care of yourself   Continue the gabapentin  In a month if doing well we may want to cut the dose  Let me know at that time how you are doing      Keep the rash clean with soap and water  If any changes please let me know

## 2021-12-24 NOTE — Assessment & Plan Note (Addendum)
Right  mid back/chest wall - with healing/drying rash  Pain is much improved with gabapentin 300 mg tid/tolerating well  Will continue using soap/water and caring for skin  inst to call if worse rash/changes  Consider starting to reduce dose of gabapentin/wean in a month if she continues to improve Pt plans to get shingrix if affordable in 6 mo or later depending on progress

## 2021-12-24 NOTE — Progress Notes (Signed)
Subjective:    Patient ID: Early Chars, female    DOB: 25-Nov-1945, 76 y.o.   MRN: [id number]  This visit occurred during the SARS-CoV-2 public health emergency.  Safety protocols were in place, including screening questions prior to the visit, additional usage of staff PPE, and extensive cleaning of exam room while observing appropriate contact time as indicated for disinfecting solutions.   HPI Pt presents for f/u of zoster/post herp neuralgia  Wt Readings from Last 3 Encounters:  12/24/21 168 lb (76.2 kg)  12/12/21 164 lb (74.4 kg)  09/18/21 171 lb (77.6 kg)   30.24 kg/m   She saw NP Dugal on 2/1  Px gabapentin to titrate to 300 mg   Doing a lot better   Taking 300 mg tid  No side effects at all   Occ tylenol   Rash is calmed down and dried up  Feels a "worm" sensation under the skin   BP Readings from Last 3 Encounters:  12/24/21 118/78  12/12/21 128/82  09/18/21 (!) 148/58   Feels ok otherwise   Patient Active Problem List   Diagnosis Date Noted   Other postherpetic nervous system involvement 12/12/2021   Post herpetic neuralgia 12/12/2021   Cholelithiasis 06/04/2021   Diminished pulses in lower extremity 05/24/2020   Myelodysplastic syndrome, unspecified ([place]) 08/31/2019   Estrogen deficiency 08/31/2019   Diverticulitis 04/27/2018   Constipation 03/18/2018   MGUS (monoclonal gammopathy of unknown significance) 09/17/2017   Prediabetes 10/21/2016   Somnolence, daytime 04/11/2016   Fatigue 04/05/2016   Internal hemorrhoid 04/20/2014   Screening mammogram, encounter for 03/01/2014   Uterine prolapse 03/01/2014   Stress reaction 09/01/2012   Hyperlipidemia 04/28/2012   Routine general medical examination at a health care facility 04/20/2012   Obesity 02/05/2012   Other neutropenia ([place]) 10/23/2010   Thrombocytopenia ([place]) 10/24/2009   Elevated transaminase level 10/24/2009   POSTMENOPAUSAL STATUS 08/09/2008   Depression with anxiety 08/05/2007    Essential hypertension 08/05/2007   GERD 04/20/2007   DIVERTICULAR DISEASE 04/20/2007   History of colonic polyps 04/13/2007   Past Medical History:  Diagnosis Date   Adenomatous polyp of colon 03/2007   Anemia    s/p GI bleed, hospital 5/08   Anxiety    Depression    Diverticulosis    Endometriosis    uterine US 06/2003   GERD (gastroesophageal reflux disease)    Hiatal hernia    EGD 5/08- stricture, HH, gastritis, GERD   History of blood transfusion    Hyperlipidemia    Hypertension    Hypertension    Iron deficiency 03/16/2014   Myelodysplastic disease ([place])    Past Surgical History:  Procedure Laterality Date   cardiac CT  9/12   normal    CHOLECYSTECTOMY N/A 09/18/2021   Procedure: LAPAROSCOPIC CHOLECYSTECTOMY;  Surgeon: [name], MD;  Location: [facility];  Service: General;  Laterality: N/A;   HAMMER TOE SURGERY Left 12/02/2017   INTRAOPERATIVE CHOLANGIOGRAM N/A 09/18/2021   Procedure: INTRAOPERATIVE CHOLANGIOGRAM;  Surgeon: [name], MD;  Location: [facility];  Service: General;  Laterality: N/A;   KNEE SURGERY Left    Cyst behind left knee.   TUBAL LIGATION     Social History   Tobacco Use   Smoking status: Never   Smokeless tobacco: Never  Vaping Use   Vaping Use: Never used  Substance Use Topics   Alcohol use: No    Alcohol/week: 0.0 standard drinks   Drug use: No   Family History  Problem Relation Age of Onset   Cancer Father        unknown type   Hypertension Mother    Colon cancer Neg Hx    Esophageal cancer Neg Hx    Rectal cancer Neg Hx    Stomach cancer Neg Hx    No Known Allergies Current Outpatient Medications on File Prior to Visit  Medication Sig Dispense Refill   atorvastatin (LIPITOR) 10 MG tablet TAKE 1 TABLET BY MOUTH EVERY DAY 90 tablet 1   gabapentin (NEURONTIN) 300 MG capsule Take 1 capsule (300 mg total) by mouth 3 (three) times daily. 90 capsule 0   hydrochlorothiazide (HYDRODIURIL) 25 MG tablet TAKE 1 TABLET (25 MG TOTAL) BY  MOUTH DAILY. 90 tablet 1   No current facility-administered medications on file prior to visit.      Review of Systems  Constitutional:  Negative for activity change, appetite change, fatigue, fever and unexpected weight change.  HENT:  Negative for congestion, ear pain, rhinorrhea, sinus pressure and sore throat.   Eyes:  Negative for pain, redness and visual disturbance.  Respiratory:  Negative for cough, shortness of breath and wheezing.   Cardiovascular:  Negative for chest pain and palpitations.  Gastrointestinal:  Negative for abdominal pain, blood in stool, constipation and diarrhea.  Endocrine: Negative for polydipsia and polyuria.  Genitourinary:  Negative for dysuria, frequency and urgency.  Musculoskeletal:  Negative for arthralgias, back pain and myalgias.  Skin:  Positive for rash. Negative for pallor.  Allergic/Immunologic: Negative for environmental allergies.  Neurological:  Negative for dizziness, syncope and headaches.  Hematological:  Negative for adenopathy. Does not bruise/bleed easily.  Psychiatric/Behavioral:  Negative for decreased concentration and dysphoric mood. The patient is not nervous/anxious.       Objective:   Physical Exam Constitutional:      General: She is not in acute distress.    Appearance: Normal appearance. She is obese. She is not ill-appearing or diaphoretic.  Eyes:     General:        Right eye: No discharge.        Left eye: No discharge.     Conjunctiva/sclera: Conjunctivae normal.     Pupils: Pupils are equal, round, and reactive to light.  Neck:     Vascular: No carotid bruit.  Cardiovascular:     Rate and Rhythm: Normal rate and regular rhythm.     Heart sounds: Normal heart sounds.  Pulmonary:     Effort: Pulmonary effort is normal. No respiratory distress.     Breath sounds: Normal breath sounds. No wheezing.  Musculoskeletal:     Cervical back: Neck supple. No tenderness.     Right lower leg: No edema.     Left lower  leg: No edema.  Lymphadenopathy:     Cervical: No cervical adenopathy.  Skin:    Comments: Drying vesicular rash mid R back/flank to chest Mildly tender No open areas  Some hyperpigmentation   Neurological:     Mental Status: She is alert.     Cranial Nerves: No cranial nerve deficit.     Sensory: No sensory deficit.  Psychiatric:        Mood and Affect: Mood normal.          Assessment & Plan:   Problem List Items Addressed This Visit       Nervous and Auditory   Post herpetic neuralgia - Primary     Other   Other postherpetic nervous system involvement  Right  mid back/chest wall - with healing/drying rash  Pain is much improved with gabapentin 300 mg tid/tolerating well  Will continue using soap/water and caring for skin  inst to call if worse rash/changes  Consider starting to reduce dose of gabapentin/wean in a month if she continues to improve Pt plans to get shingrix if affordable in 6 mo or later depending on progress

## 2022-01-07 ENCOUNTER — Other Ambulatory Visit: Payer: Self-pay | Admitting: Family

## 2022-01-07 DIAGNOSIS — B0229 Other postherpetic nervous system involvement: Secondary | ICD-10-CM

## 2022-01-21 ENCOUNTER — Other Ambulatory Visit: Payer: Self-pay | Admitting: Family Medicine

## 2022-01-21 DIAGNOSIS — Z1231 Encounter for screening mammogram for malignant neoplasm of breast: Secondary | ICD-10-CM

## 2022-01-30 ENCOUNTER — Other Ambulatory Visit: Payer: Self-pay | Admitting: Family Medicine

## 2022-01-31 NOTE — Telephone Encounter (Signed)
Last F/u was on 01/21/22, pt was on med but it looks like hospital d/c med at discharge back in Nov, please advise  ?

## 2022-02-28 ENCOUNTER — Ambulatory Visit
Admission: RE | Admit: 2022-02-28 | Discharge: 2022-02-28 | Disposition: A | Discharge status: Home / Self Care | Payer: Medicare PPO | Source: Ambulatory Visit | Attending: Family Medicine | Admitting: Family Medicine

## 2022-02-28 DIAGNOSIS — Z1231 Encounter for screening mammogram for malignant neoplasm of breast: Secondary | ICD-10-CM | POA: Diagnosis not present

## 2022-03-13 ENCOUNTER — Ambulatory Visit (INDEPENDENT_AMBULATORY_CARE_PROVIDER_SITE_OTHER): Payer: Medicare PPO

## 2022-03-13 VITALS — Ht 63.0 in | Wt 168.0 lb

## 2022-03-13 DIAGNOSIS — Z0000 Encounter for general adult medical examination without abnormal findings: Secondary | ICD-10-CM | POA: Diagnosis not present

## 2022-03-13 NOTE — Patient Instructions (Signed)
Kathryn Weaver , ?Thank you for taking time to come for your Medicare Wellness Visit. I appreciate your ongoing commitment to your health goals. Please review the following plan we discussed and let me know if I can assist you in the future.  ? ?Screening recommendations/referrals: ?Colonoscopy: Done 08/28/2018 Repeat in 5 years ? ?Mammogram: Done 02/28/2022. Repeat annually ? ?Bone Density: Done 02/01/2020 Repeat every 2 years ? ?Recommended yearly ophthalmology/optometry visit for glaucoma screening and checkup ?Recommended yearly dental visit for hygiene and checkup ? ?Vaccinations: ?Influenza vaccine: Due Fall 2023. ?Pneumococcal vaccine: Done 03/01/2014 and 10/18/2015 ?Tdap vaccine: Done 08/09/2008 Repeat in 10 years ? ?Shingles vaccine: Discussed.    ?Covid-19:Done 09/05/2020, 04/30/2021, 09/05/2020, 01/07/2020 and 12/15/2019. ? ?Advanced directives: Advance directive discussed with you today. Even though you declined this today, please call our office should you change your mind, and we can give you the proper paperwork for you to fill out. ? ? ?Conditions/risks identified: Aim for 30 minutes of exercise or brisk walking, 6-8 glasses of water, and 5 servings of fruits and vegetables each day. ? ? ?Next appointment: Follow up in one year for your annual wellness visit 03/17/2023 @ 10:45 AM.  ? ? ?Preventive Care 37 Years and Older, Female ?Preventive care refers to lifestyle choices and visits with your health care provider that can promote health and wellness. ?What does preventive care include? ?A yearly physical exam. This is also called an annual well check. ?Dental exams once or twice a year. ?Routine eye exams. Ask your health care provider how often you should have your eyes checked. ?Personal lifestyle choices, including: ?Daily care of your teeth and gums. ?Regular physical activity. ?Eating a healthy diet. ?Avoiding tobacco and drug use. ?Limiting alcohol use. ?Practicing safe sex. ?Taking low-dose aspirin every  day. ?Taking vitamin and mineral supplements as recommended by your health care provider. ?What happens during an annual well check? ?The services and screenings done by your health care provider during your annual well check will depend on your age, overall health, lifestyle risk factors, and family history of disease. ?Counseling  ?Your health care provider may ask you questions about your: ?Alcohol use. ?Tobacco use. ?Drug use. ?Emotional well-being. ?Home and relationship well-being. ?Sexual activity. ?Eating habits. ?History of falls. ?Memory and ability to understand (cognition). ?Work and work Statistician. ?Reproductive health. ?Screening  ?You may have the following tests or measurements: ?Height, weight, and BMI. ?Blood pressure. ?Lipid and cholesterol levels. These may be checked every 5 years, or more frequently if you are over 67 years old. ?Skin check. ?Lung cancer screening. You may have this screening every year starting at age 60 if you have a 30-pack-year history of smoking and currently smoke or have quit within the past 15 years. ?Fecal occult blood test (FOBT) of the stool. You may have this test every year starting at age 75. ?Flexible sigmoidoscopy or colonoscopy. You may have a sigmoidoscopy every 5 years or a colonoscopy every 10 years starting at age 2. ?Hepatitis C blood test. ?Hepatitis B blood test. ?Sexually transmitted disease (STD) testing. ?Diabetes screening. This is done by checking your blood sugar (glucose) after you have not eaten for a while (fasting). You may have this done every 1-3 years. ?Bone density scan. This is done to screen for osteoporosis. You may have this done starting at age 20. ?Mammogram. This may be done every 1-2 years. Talk to your health care provider about how often you should have regular mammograms. ?Talk with your health care provider  about your test results, treatment options, and if necessary, the need for more tests. ?Vaccines  ?Your health care  provider may recommend certain vaccines, such as: ?Influenza vaccine. This is recommended every year. ?Tetanus, diphtheria, and acellular pertussis (Tdap, Td) vaccine. You may need a Td booster every 10 years. ?Zoster vaccine. You may need this after age 39. ?Pneumococcal 13-valent conjugate (PCV13) vaccine. One dose is recommended after age 31. ?Pneumococcal polysaccharide (PPSV23) vaccine. One dose is recommended after age 41. ?Talk to your health care provider about which screenings and vaccines you need and how often you need them. ?This information is not intended to replace advice given to you by your health care provider. Make sure you discuss any questions you have with your health care provider. ?Document Released: 11/24/2015 Document Revised: 07/17/2016 Document Reviewed: 08/29/2015 ?Elsevier Interactive Patient Education ? 2017 [employer]. ? ?Fall Prevention in the Home ?Falls can cause injuries. They can happen to people of all ages. There are many things you can do to make your home safe and to help prevent falls. ?What can I do on the outside of my home? ?Regularly fix the edges of walkways and driveways and fix any cracks. ?Remove anything that might make you trip as you walk through a door, such as a raised step or threshold. ?Trim any bushes or trees on the path to your home. ?Use bright outdoor lighting. ?Clear any walking paths of anything that might make someone trip, such as rocks or tools. ?Regularly check to see if handrails are loose or broken. Make sure that both sides of any steps have handrails. ?Any raised decks and porches should have guardrails on the edges. ?Have any leaves, snow, or ice cleared regularly. ?Use sand or salt on walking paths during winter. ?Clean up any spills in your garage right away. This includes oil or grease spills. ?What can I do in the bathroom? ?Use night lights. ?Install grab bars by the toilet and in the tub and shower. Do not use towel bars as grab  bars. ?Use non-skid mats or decals in the tub or shower. ?If you need to sit down in the shower, use a plastic, non-slip stool. ?Keep the floor dry. Clean up any water that spills on the floor as soon as it happens. ?Remove soap buildup in the tub or shower regularly. ?Attach bath mats securely with double-sided non-slip rug tape. ?Do not have throw rugs and other things on the floor that can make you trip. ?What can I do in the bedroom? ?Use night lights. ?Make sure that you have a light by your bed that is easy to reach. ?Do not use any sheets or blankets that are too big for your bed. They should not hang down onto the floor. ?Have a firm chair that has side arms. You can use this for support while you get dressed. ?Do not have throw rugs and other things on the floor that can make you trip. ?What can I do in the kitchen? ?Clean up any spills right away. ?Avoid walking on wet floors. ?Keep items that you use a lot in easy-to-reach places. ?If you need to reach something above you, use a strong step stool that has a grab bar. ?Keep electrical cords out of the way. ?Do not use floor polish or wax that makes floors slippery. If you must use wax, use non-skid floor wax. ?Do not have throw rugs and other things on the floor that can make you trip. ?What can I do  with my stairs? ?Do not leave any items on the stairs. ?Make sure that there are handrails on both sides of the stairs and use them. Fix handrails that are broken or loose. Make sure that handrails are as long as the stairways. ?Check any carpeting to make sure that it is firmly attached to the stairs. Fix any carpet that is loose or worn. ?Avoid having throw rugs at the top or bottom of the stairs. If you do have throw rugs, attach them to the floor with carpet tape. ?Make sure that you have a light switch at the top of the stairs and the bottom of the stairs. If you do not have them, ask someone to add them for you. ?What else can I do to help prevent  falls? ?Wear shoes that: ?Do not have high heels. ?Have rubber bottoms. ?Are comfortable and fit you well. ?Are closed at the toe. Do not wear sandals. ?If you use a stepladder: ?Make sure that it is fully opened. Do not

## 2022-03-13 NOTE — Progress Notes (Signed)
? ?Subjective:  ? Kathryn Weaver is a 76 y.o. female who presents for Medicare Annual (Subsequent) preventive examination. ?Virtual Visit via Telephone Note ? ?I connected with  Kathryn Weaver on 03/13/22 at 10:45 AM EDT by telephone and verified that I am speaking with the correct person using two identifiers. ? ?Location: ?Patient: HOME ?Provider: [facility] ?Persons participating in the virtual visit: patient/Nurse Health Advisor ?  ?I discussed the limitations, risks, security and privacy concerns of performing an evaluation and management service by telephone and the availability of in person appointments. The patient expressed understanding and agreed to proceed. ? ?Interactive audio and video telecommunications were attempted between this nurse and patient, however failed, due to patient having technical difficulties OR patient did not have access to video capability.  We continued and completed visit with audio only. ? ?Some vital signs may be absent or patient reported.  ? ?Chriss Driver, LPN ? ?Review of Systems    ? ? ?Cardiac Risk Factors include: advanced age (>68mn, >>49women);hypertension;dyslipidemia;sedentary lifestyle ? ?   ?Objective:  ?  ?Today's Vitals  ? 03/13/22 1048  ?Weight: 168 lb (76.2 kg)  ?Height: '5\' 3"'$  (1.6 m)  ? ?Body mass index is 29.76 kg/m?. ? ? ?  03/13/2022  ? 10:52 AM 09/18/2021  ? 11:22 AM 08/26/2021  ?  3:27 PM 05/19/2021  ? 11:18 PM 10/25/2020  ?  9:05 AM 08/24/2019  ? 11:32 AM 09/07/2018  ? 10:02 AM  ?Advanced Directives  ?Does Patient Have a Medical Advance Directive? No No No No No No No  ?Would patient like information on creating a medical advance directive? No - Patient declined No - Patient declined Yes (ED - Information included in AVS) No - Patient declined No - Patient declined No - Patient declined No - Patient declined  ? ? ?Current Medications (verified) ?Outpatient Encounter Medications as of 03/13/2022  ?Medication Sig  ? atorvastatin (LIPITOR) 10 MG tablet TAKE 1  TABLET BY MOUTH EVERY DAY  ? gabapentin (NEURONTIN) 300 MG capsule TAKE 1 CAPSULE BY MOUTH THREE TIMES A DAY  ? hydrochlorothiazide (HYDRODIURIL) 25 MG tablet TAKE 1 TABLET (25 MG TOTAL) BY MOUTH DAILY.  ? sertraline (ZOLOFT) 50 MG tablet TAKE 1 TABLET BY MOUTH EVERY DAY  ? ?No facility-administered encounter medications on file as of 03/13/2022.  ? ? ?Allergies (verified) ?Patient has no known allergies.  ? ?History: ?Past Medical History:  ?Diagnosis Date  ? Adenomatous polyp of colon 03/2007  ? Anemia   ? s/p GI bleed, hospital 5/08  ? Anxiety   ? Depression   ? Diverticulosis   ? Endometriosis   ? uterine [place]8/2004  ? GERD (gastroesophageal reflux disease)   ? Hiatal hernia   ? EGD 5/08- stricture, HH, gastritis, GERD  ? History of blood transfusion   ? Hyperlipidemia   ? Hypertension   ? Hypertension   ? Iron deficiency 03/16/2014  ? Myelodysplastic disease ([place]   ? ?Past Surgical History:  ?Procedure Laterality Date  ? cardiac CT  9/12  ? normal   ? CHOLECYSTECTOMY N/A 09/18/2021  ? Procedure: LAPAROSCOPIC CHOLECYSTECTOMY;  Surgeon: WGreer Pickerel MD;  Location[place] [facility]  Service: General;  Laterality: N/A;  ? HAMMER TOE SURGERY Left 12/02/2017  ? INTRAOPERATIVE CHOLANGIOGRAM N/A 09/18/2021  ? Procedure: INTRAOPERATIVE CHOLANGIOGRAM;  Surgeon: WGreer Pickerel MD;  [place] [facility]  Service: General;  Laterality: N/A;  ? KNEE SURGERY Left   ? Cyst behind left knee.  ?  TUBAL LIGATION    ? ?Family History  ?Problem Relation Age of Onset  ? Cancer Father   ?     unknown type  ? Hypertension Mother   ? Colon cancer Neg Hx   ? Esophageal cancer Neg Hx   ? Rectal cancer Neg Hx   ? Stomach cancer Neg Hx   ? ?Social History  ? ?Socioeconomic History  ? Marital status: Married  ?  Spouse name: Not on file  ? Number of children: Not on file  ? Years of education: Not on file  ? Highest education level: Not on file  ?Occupational History  ? Occupation: [employer]  ?Tobacco Use  ? Smoking status: Never  ? Smokeless  tobacco: Never  ?Vaping Use  ? Vaping Use: Never used  ?Substance and Sexual Activity  ? Alcohol use: No  ?  Alcohol/week: 0.0 standard drinks  ? Drug use: No  ? Sexual activity: Yes  ?Other Topics Concern  ? Not on file  ?Social History Narrative  ? Not on file  ? ?Social Determinants of Health  ? ?Financial Resource Strain: Low Risk   ? Difficulty of Paying Living Expenses: Not hard at all  ?Food Insecurity: No Food Insecurity  ? Worried About Charity fundraiser in the Last Year: Never true  ? Ran Out of Food in the Last Year: Never true  ?Transportation Needs: No Transportation Needs  ? Lack of Transportation (Medical): No  ? Lack of Transportation (Non-Medical): No  ?Physical Activity: Inactive  ? Days of Exercise per Week: 0 days  ? Minutes of Exercise per Session: 0 min  ?Stress: No Stress Concern Present  ? Feeling of Stress : Not at all  ?Social Connections: Socially Integrated  ? Frequency of Communication with Friends and Family: More than three times a week  ? Frequency of Social Gatherings with Friends and Family: More than three times a week  ? Attends Religious Services: More than 4 times per year  ? Active Member of Clubs or Organizations: Yes  ? Attends Archivist Meetings: More than 4 times per year  ? Marital Status: Married  ? ? ?Tobacco Counseling ?Counseling given: Not Answered ? ? ?Clinical Intake: ? ?Pre-visit preparation completed: Yes ? ?Pain : No/denies pain ? ?  ? ?BMI - recorded: 29.76 ?Nutritional Status: BMI 25 -29 Overweight ?Nutritional Risks: None ?Diabetes: No ? ?How often do you need to have someone help you when you read instructions, pamphlets, or other written materials from your doctor or pharmacy?: 1 - Never ? ?Diabetic?NO ? ?Interpreter Needed?: No ? ?Information entered by :: mj Thos Matsumoto, lpn ? ? ?Activities of Daily Living ? ?  03/13/2022  ? 10:54 AM 09/18/2021  ? 11:22 AM  ?In your present state of health, do you have any difficulty performing the following  activities:  ?Hearing? 0   ?Vision? 0   ?Difficulty concentrating or making decisions? 0   ?Walking or climbing stairs? 0   ?Dressing or bathing? 0   ?Doing errands, shopping? 0 0  ?Preparing Food and eating ? N   ?Using the Toilet? N   ?In the past six months, have you accidently leaked urine? N   ?Do you have problems with loss of bowel control? N   ?Managing your Medications? N   ?Managing your Finances? N   ?Housekeeping or managing your Housekeeping? N   ? ? ?Patient Care Team: ?Tower, [name], MD as PCP - General ?Jenkins Rouge  C, MD as PCP - Cardiology (Cardiology) ? ?Indicate any recent Medical Services you may have received from other than [facility] providers in the past year (date may be approximate). ? ?   ?Assessment:  ? This is a routine wellness examination for Kathryn Weaver. ? ?Hearing/Vision screen ?Hearing Screening - Comments:: No hearing issues.  ?Vision Screening - Comments:: Glasses to drive. Dr. Simonne Come. 2022. ? ?Dietary issues and exercise activities discussed: ?Current Exercise Habits: The patient does not participate in regular exercise at present, Exercise limited by: cardiac condition(s) ? ? Goals Addressed   ? ?  ?  ?  ?  ? This Visit's Progress  ?  DIET - EAT MORE FRUITS AND VEGETABLES   On track  ?  Starting 01/28/2018, I will continue to eat 4-5 servings of fruits and vegetables. ? ?  ?  Increase physical activity   Not on track  ?  Starting 1/11/16/16, I will continue to walk at least 30 min 1-2 days per week.  ? ?  ?  Patient Stated   On track  ?  08/24/2019, I will maintain my weight where it is.  ?  ?  Patient Stated   On track  ?  10/25/2020, I will maintain and continue medications as prescribed.  ?  ? ?  ? ?Depression Screen ? ?  03/13/2022  ? 10:50 AM 12/12/2021  ?  9:08 AM 10/25/2020  ?  9:06 AM 11/30/2019  ?  8:39 AM 08/24/2019  ? 11:32 AM 01/28/2018  ? 11:58 AM 11/26/2016  ?  9:26 AM  ?PHQ 2/9 Scores  ?PHQ - 2 Score 0 0 0 0 0 0 0  ?PHQ- 9 Score  6 0  0 0   ?  ?Fall Risk ? ?  03/13/2022  ? 10:52 AM  12/12/2021  ?  9:10 AM 10/25/2020  ?  9:05 AM 08/24/2019  ? 11:32 AM 01/28/2018  ? 11:58 AM  ?Fall Risk   ?Falls in the past year? 0 0 0 0 No  ?Number falls in past yr: 0 0 0    ?Injury with Fall? 0 0 0    ?Risk for f

## 2022-03-31 ENCOUNTER — Other Ambulatory Visit: Payer: Self-pay | Admitting: Family Medicine

## 2022-04-29 DIAGNOSIS — H524 Presbyopia: Secondary | ICD-10-CM | POA: Diagnosis not present

## 2022-04-29 DIAGNOSIS — H52223 Regular astigmatism, bilateral: Secondary | ICD-10-CM | POA: Diagnosis not present

## 2022-04-29 DIAGNOSIS — H25042 Posterior subcapsular polar age-related cataract, left eye: Secondary | ICD-10-CM | POA: Diagnosis not present

## 2022-04-29 DIAGNOSIS — H43813 Vitreous degeneration, bilateral: Secondary | ICD-10-CM | POA: Diagnosis not present

## 2022-04-29 DIAGNOSIS — H25013 Cortical age-related cataract, bilateral: Secondary | ICD-10-CM | POA: Diagnosis not present

## 2022-04-29 DIAGNOSIS — H2513 Age-related nuclear cataract, bilateral: Secondary | ICD-10-CM | POA: Diagnosis not present

## 2022-05-08 ENCOUNTER — Ambulatory Visit (INDEPENDENT_AMBULATORY_CARE_PROVIDER_SITE_OTHER): Payer: Medicare PPO | Admitting: Family Medicine

## 2022-05-08 ENCOUNTER — Encounter: Payer: Self-pay | Admitting: Family Medicine

## 2022-05-08 VITALS — BP 118/74 | HR 53 | Ht 62.0 in | Wt 169.2 lb

## 2022-05-08 DIAGNOSIS — F430 Acute stress reaction: Secondary | ICD-10-CM | POA: Diagnosis not present

## 2022-05-08 DIAGNOSIS — D696 Thrombocytopenia, unspecified: Secondary | ICD-10-CM

## 2022-05-08 DIAGNOSIS — R7303 Prediabetes: Secondary | ICD-10-CM

## 2022-05-08 DIAGNOSIS — E2839 Other primary ovarian failure: Secondary | ICD-10-CM

## 2022-05-08 DIAGNOSIS — I10 Essential (primary) hypertension: Secondary | ICD-10-CM

## 2022-05-08 DIAGNOSIS — Z0000 Encounter for general adult medical examination without abnormal findings: Secondary | ICD-10-CM | POA: Diagnosis not present

## 2022-05-08 DIAGNOSIS — D469 Myelodysplastic syndrome, unspecified: Secondary | ICD-10-CM

## 2022-05-08 DIAGNOSIS — Z6830 Body mass index (BMI) 30.0-30.9, adult: Secondary | ICD-10-CM

## 2022-05-08 DIAGNOSIS — E6609 Other obesity due to excess calories: Secondary | ICD-10-CM | POA: Diagnosis not present

## 2022-05-08 DIAGNOSIS — E7800 Pure hypercholesterolemia, unspecified: Secondary | ICD-10-CM

## 2022-05-08 LAB — CBC WITH DIFFERENTIAL/PLATELET
Basophils Absolute: 0 10*3/uL (ref 0.0–0.1)
Basophils Relative: 0.6 % (ref 0.0–3.0)
Eosinophils Absolute: 0.1 10*3/uL (ref 0.0–0.7)
Eosinophils Relative: 3.6 % (ref 0.0–5.0)
HCT: 38.6 % (ref 36.0–46.0)
Hemoglobin: 12.8 g/dL (ref 12.0–15.0)
Lymphocytes Relative: 50.3 % — ABNORMAL HIGH (ref 12.0–46.0)
Lymphs Abs: 1.5 10*3/uL (ref 0.7–4.0)
MCHC: 33.2 g/dL (ref 30.0–36.0)
MCV: 97.6 fl (ref 78.0–100.0)
Monocytes Absolute: 0.4 10*3/uL (ref 0.1–1.0)
Monocytes Relative: 12.9 % — ABNORMAL HIGH (ref 3.0–12.0)
Neutro Abs: 1 10*3/uL — ABNORMAL LOW (ref 1.4–7.7)
Neutrophils Relative %: 32.6 % — ABNORMAL LOW (ref 43.0–77.0)
Platelets: 97 10*3/uL — ABNORMAL LOW (ref 150.0–400.0)
RBC: 3.96 Mil/uL (ref 3.87–5.11)
RDW: 12.8 % (ref 11.5–15.5)
WBC: 3.1 10*3/uL — ABNORMAL LOW (ref 4.0–10.5)

## 2022-05-08 LAB — COMPREHENSIVE METABOLIC PANEL
ALT: 22 U/L (ref 0–35)
AST: 22 U/L (ref 0–37)
Albumin: 3.9 g/dL (ref 3.5–5.2)
Alkaline Phosphatase: 91 U/L (ref 39–117)
BUN: 14 mg/dL (ref 6–23)
CO2: 32 mEq/L (ref 19–32)
Calcium: 10.9 mg/dL — ABNORMAL HIGH (ref 8.4–10.5)
Chloride: 101 mEq/L (ref 96–112)
Creatinine, Ser: 0.86 mg/dL (ref 0.40–1.20)
GFR: 66.3 mL/min (ref >60.00)
Glucose, Bld: 112 mg/dL — ABNORMAL HIGH (ref 70–99)
Potassium: 3.6 mEq/L (ref 3.5–5.1)
Sodium: 138 mEq/L (ref 135–145)
Total Bilirubin: 0.7 mg/dL (ref 0.2–1.2)
Total Protein: 7.7 g/dL (ref 6.0–8.3)

## 2022-05-08 LAB — HEMOGLOBIN A1C: Hgb A1c MFr Bld: 6.1 % (ref 4.6–6.5)

## 2022-05-08 LAB — TSH: TSH: 3.56 u[IU]/mL (ref 0.35–5.50)

## 2022-05-08 LAB — LIPID PANEL
Cholesterol: 163 mg/dL (ref 0–200)
HDL: 63.1 mg/dL (ref >39.00)
LDL Cholesterol: 86 mg/dL (ref 0–99)
NonHDL: 99.99
Total CHOL/HDL Ratio: 3
Triglycerides: 70 mg/dL (ref 0.0–149.0)
VLDL: 14 mg/dL (ref 0.0–40.0)

## 2022-05-08 NOTE — Assessment & Plan Note (Signed)
dexa ordered Disc imp of ca and D for bone health

## 2022-05-08 NOTE — Assessment & Plan Note (Signed)
Cbc today  No excessive bleed/bruising  Hematology signed off in past

## 2022-05-08 NOTE — Assessment & Plan Note (Signed)
Discussed how this problem influences overall health and the risks it imposes  Reviewed plan for weight loss with lower calorie diet (via better food choices and also portion control or program like weight watchers) and exercise building up to or more than 30 minutes 5 days per week including some aerobic activity    

## 2022-05-08 NOTE — Progress Notes (Signed)
Subjective:    Patient ID: Early Chars, female    DOB: 09/15/1946, 76 y.o.   MRN: [id number]  HPI Here for health maintenance exam and to review chronic medical problems   Amw reviewed from May   Wt Readings from Last 3 Encounters:  05/08/22 169 lb 3.2 oz (76.7 kg)  03/13/22 168 lb (76.2 kg)  12/24/21 168 lb (76.2 kg)   30.95 kg/m  Keeps grandchildren 7 and 11  Feels ok overall   Taking care of herself   Exercise- a little bit of walking    Immunization History  Administered Date(s) Administered   Fluad Quad(high Dose 65+) 08/31/2019   Influenza,inj,Quad PF,6+ Mos 10/18/2015, 08/28/2016, 02/18/2018   Influenza-Unspecified 09/11/2018   PFIZER Comirnaty(Gray Top)Covid-19 Tri-Sucrose Vaccine 04/30/2021   PFIZER(Purple Top)SARS-COV-2 Vaccination 12/15/2019, 01/07/2020, 08/26/2020   Pfizer Covid-19 Vaccine Bivalent Booster 44yr & up 09/05/2021   Pneumococcal Conjugate-13 10/18/2015   Pneumococcal Polysaccharide-23 03/01/2014   Td 03/26/1996, 08/09/2008   Zoster status : had shingles / occ a little pain / much better Ready to get the shingrix vaccine    Mammogram 02/2022  Self breast exam: no lumps   Colonoscopy 08/2018 with 5 y recall  Dexa 01/2020   Mild osteopenia  Falls-none Fractures-none  Supplements - taking vit D Exercise - walking   HTN bp is stable today  No cp or palpitations or headaches or edema  No side effects to medicines  BP Readings from Last 3 Encounters:  05/08/22 118/74  12/24/21 118/78  12/12/21 128/82     Hctz 25 mg daily  Pulse Readings from Last 3 Encounters:  05/08/22 (!) 53  12/24/21 78  12/12/21 85     Lab Results  Component Value Date   CREATININE 0.87 09/18/2021   BUN 11 09/18/2021   NA 137 09/18/2021   K 4.7 09/18/2021   CL 101 09/18/2021   CO2 25 09/18/2021   Due for labs    GERD Omeprazole-no longer taking    H/o thrombocytopenia  Lab Results  Component Value Date   WBC 6.0 08/26/2021   HGB 13.0  08/26/2021   HCT 39.7 08/26/2021   MCV 98.5 08/26/2021   PLT 111 (L) 08/26/2021   Followed by heme in the past  Myelodysplastic syndrome  Mood  Phq:    05/08/2022    8:04 AM 03/13/2022   10:50 AM 12/12/2021    9:08 AM 10/25/2020    9:06 AM 11/30/2019    8:39 AM  Depression screen PHQ 2/9  Decreased Interest 0 0 0 0 0  Down, Depressed, Hopeless 0 0 0 0 0  PHQ - 2 Score 0 0 0 0 0  Altered sleeping   3 0   Tired, decreased energy   3 0   Change in appetite   0 0   Feeling bad or failure about yourself    0 0   Trouble concentrating   0 0   Moving slowly or fidgety/restless   0 0   Suicidal thoughts   0 0   PHQ-9 Score   6 0   Difficult doing work/chores    Not difficult at all     Depression/anxiety  Sertraline 50 mg daily- helping a lot   Hyperlipidemia Lab Results  Component Value Date   CHOL 156 10/25/2020   HDL 61.40 10/25/2020   LDLCALC 85 10/25/2020   LDLDIRECT 156.0 04/21/2012   TRIG 47.0 10/25/2020   CHOLHDL 3 10/25/2020   Atorvastatin 10 mg  daily   Prediabetes Lab Results  Component Value Date   HGBA1C 6.2 06/04/2021   Patient Active Problem List   Diagnosis Date Noted   Other postherpetic nervous system involvement 12/12/2021   Post herpetic neuralgia 12/12/2021   Cholelithiasis 06/04/2021   Diminished pulses in lower extremity 05/24/2020   Myelodysplastic syndrome, unspecified ([place]) 08/31/2019   Estrogen deficiency 08/31/2019   Diverticulitis 04/27/2018   Constipation 03/18/2018   MGUS (monoclonal gammopathy of unknown significance) 09/17/2017   Prediabetes 10/21/2016   Somnolence, daytime 04/11/2016   Internal hemorrhoid 04/20/2014   Screening mammogram, encounter for 03/01/2014   Uterine prolapse 03/01/2014   Stress reaction 09/01/2012   Hyperlipidemia 04/28/2012   Routine general medical examination at a health care facility 04/20/2012   Obesity 02/05/2012   Other neutropenia ([place]) 10/23/2010   Thrombocytopenia ([place]) 10/24/2009   Elevated  transaminase level 10/24/2009   POSTMENOPAUSAL STATUS 08/09/2008   Depression with anxiety 08/05/2007   Essential hypertension 08/05/2007   GERD 04/20/2007   DIVERTICULAR DISEASE 04/20/2007   History of colonic polyps 04/13/2007   Past Medical History:  Diagnosis Date   Adenomatous polyp of colon 03/2007   Anemia    s/p GI bleed, hospital 5/08   Anxiety    Depression    Diverticulosis    Endometriosis    uterine US 06/2003   GERD (gastroesophageal reflux disease)    Hiatal hernia    EGD 5/08- stricture, HH, gastritis, GERD   History of blood transfusion    Hyperlipidemia    Hypertension    Hypertension    Iron deficiency 03/16/2014   Myelodysplastic disease ([place])    Past Surgical History:  Procedure Laterality Date   cardiac CT  9/12   normal    CHOLECYSTECTOMY N/A 09/18/2021   Procedure: LAPAROSCOPIC CHOLECYSTECTOMY;  Surgeon: [name], MD;  Location: [facility];  Service: General;  Laterality: N/A;   HAMMER TOE SURGERY Left 12/02/2017   INTRAOPERATIVE CHOLANGIOGRAM N/A 09/18/2021   Procedure: INTRAOPERATIVE CHOLANGIOGRAM;  Surgeon: [name], MD;  Location: [facility];  Service: General;  Laterality: N/A;   KNEE SURGERY Left    Cyst behind left knee.   TUBAL LIGATION     Social History   Tobacco Use   Smoking status: Never   Smokeless tobacco: Never  Vaping Use   Vaping Use: Never used  Substance Use Topics   Alcohol use: No    Alcohol/week: 0.0 standard drinks of alcohol   Drug use: No   Family History  Problem Relation Age of Onset   Cancer Father        unknown type   Hypertension Mother    Colon cancer Neg Hx    Esophageal cancer Neg Hx    Rectal cancer Neg Hx    Stomach cancer Neg Hx    No Known Allergies Current Outpatient Medications on File Prior to Visit  Medication Sig Dispense Refill   atorvastatin (LIPITOR) 10 MG tablet TAKE 1 TABLET BY MOUTH EVERY DAY 90 tablet 1   gabapentin (NEURONTIN) 300 MG capsule TAKE 1 CAPSULE BY MOUTH THREE TIMES A  DAY 90 capsule 3   hydrochlorothiazide (HYDRODIURIL) 25 MG tablet TAKE 1 TABLET (25 MG TOTAL) BY MOUTH DAILY. 90 tablet 1   sertraline (ZOLOFT) 50 MG tablet TAKE 1 TABLET BY MOUTH EVERY DAY 90 tablet 3   No current facility-administered medications on file prior to visit.    Review of Systems  Constitutional:  Negative for activity change, appetite change, fatigue, fever and  unexpected weight change.  HENT:  Negative for congestion, ear pain, rhinorrhea, sinus pressure and sore throat.   Eyes:  Negative for pain, redness and visual disturbance.  Respiratory:  Negative for cough, shortness of breath and wheezing.   Cardiovascular:  Negative for chest pain and palpitations.  Gastrointestinal:  Negative for abdominal pain, blood in stool, constipation and diarrhea.  Endocrine: Negative for polydipsia and polyuria.  Genitourinary:  Negative for dysuria, frequency and urgency.  Musculoskeletal:  Negative for arthralgias, back pain and myalgias.  Skin:  Negative for pallor and rash.  Allergic/Immunologic: Negative for environmental allergies.  Neurological:  Negative for dizziness, syncope and headaches.  Hematological:  Negative for adenopathy. Does not bruise/bleed easily.  Psychiatric/Behavioral:  Negative for decreased concentration and dysphoric mood. The patient is not nervous/anxious.        Objective:   Physical Exam Constitutional:      General: She is not in acute distress.    Appearance: Normal appearance. She is well-developed. She is obese. She is not ill-appearing or diaphoretic.  HENT:     Head: Normocephalic and atraumatic.     Right Ear: Tympanic membrane, ear canal and external ear normal.     Left Ear: Tympanic membrane, ear canal and external ear normal.     Nose: Nose normal. No congestion.     Mouth/Throat:     Mouth: Mucous membranes are moist.     Pharynx: Oropharynx is clear. No posterior oropharyngeal erythema.  Eyes:     General: No scleral icterus.     Extraocular Movements: Extraocular movements intact.     Conjunctiva/sclera: Conjunctivae normal.     Pupils: Pupils are equal, round, and reactive to light.  Neck:     Thyroid: No thyromegaly.     Vascular: No carotid bruit or JVD.  Cardiovascular:     Rate and Rhythm: Normal rate and regular rhythm.     Pulses: Normal pulses.     Heart sounds: Normal heart sounds.     No gallop.  Pulmonary:     Effort: Pulmonary effort is normal. No respiratory distress.     Breath sounds: Normal breath sounds. No wheezing.     Comments: Good air exch Chest:     Chest wall: No tenderness.  Abdominal:     General: Bowel sounds are normal. There is no distension or abdominal bruit.     Palpations: Abdomen is soft. There is no mass.     Tenderness: There is no abdominal tenderness.     Hernia: No hernia is present.  Genitourinary:    Comments: Breast exam: No mass, nodules, thickening, tenderness, bulging, retraction, inflamation, nipple discharge or skin changes noted.  No axillary or clavicular LA.     Musculoskeletal:        General: No tenderness. Normal range of motion.     Cervical back: Normal range of motion and neck supple. No rigidity. No muscular tenderness.     Right lower leg: No edema.     Left lower leg: No edema.     Comments: No kyphosis   Lymphadenopathy:     Cervical: No cervical adenopathy.  Skin:    General: Skin is warm and dry.     Coloration: Skin is not pale.     Findings: No erythema or rash.     Comments: Some skin tags scattered  Neurological:     Mental Status: She is alert. Mental status is at baseline.     Cranial Nerves: No cranial  nerve deficit.     Motor: No abnormal muscle tone.     Coordination: Coordination normal.     Gait: Gait normal.     Deep Tendon Reflexes: Reflexes are normal and symmetric. Reflexes normal.  Psychiatric:        Mood and Affect: Mood normal.        Cognition and Memory: Cognition and memory normal.           Assessment &  Plan:   Problem List Items Addressed This Visit       Cardiovascular and Mediastinum   Essential hypertension    bp in fair control at this time  BP Readings from Last 1 Encounters:  05/08/22 118/74  No changes needed Most recent labs reviewed  Disc lifstyle change with low sodium diet and exercise  Plan to continue hctz 25 mg daily  Lab ordered       Relevant Orders   TSH (Completed)   Lipid panel (Completed)   Comprehensive metabolic panel (Completed)   CBC with Differential/Platelet (Completed)     Hematopoietic and Hemostatic   Thrombocytopenia ([facility])    Cbc today  No excessive bleed/bruising  Hematology signed off in past        Other   Estrogen deficiency    dexa ordered Disc imp of ca and D for bone health      Relevant Orders   DG Bone Density   Hyperlipidemia    Disc goals for lipids and reasons to control them Rev last labs with pt Rev low sat fat diet in detail Labs ordered  Tolerates atorvastatin 10 mg daily       Relevant Orders   Lipid panel (Completed)   Myelodysplastic syndrome, unspecified ([employer])    Hematology signed off when stable Cbc today      Obesity    Discussed how this problem influences overall health and the risks it imposes  Reviewed plan for weight loss with lower calorie diet (via better food choices and also portion control or program like weight watchers) and exercise building up to or more than 30 minutes 5 days per week including some aerobic activity         Prediabetes    a1c ordered disc imp of low glycemic diet and wt loss to prevent DM2       Relevant Orders   Hemoglobin A1c (Completed)   Routine general medical examination at a health care facility - Primary    Reviewed health habits including diet and exercise and skin cancer prevention Reviewed appropriate screening tests for age  Also reviewed health mt list, fam hx and immunization status , as well as social and family history   See HPI Labs ordered   Considering shingrix vaccine/discussed this  Mammogram utd Colonoscopy utd  dexa ordered  PHQ score of 0 Encouraged exercise

## 2022-05-08 NOTE — Assessment & Plan Note (Signed)
bp in fair control at this time  BP Readings from Last 1 Encounters:  05/08/22 118/74   No changes needed Most recent labs reviewed  Disc lifstyle change with low sodium diet and exercise  Plan to continue hctz 25 mg daily  Lab ordered

## 2022-05-08 NOTE — Patient Instructions (Addendum)
If you are interested in the new shingles vaccine (Shingrix) - call your local pharmacy to check on coverage and availability  If affordable, get on a wait list at your pharmacy to get the vaccine.   Exercise is important for stress and general health   Eat a healthy balanced diet  Try to get most of your carbohydrates from produce (with the exception of white potatoes)  Eat less bread/pasta/rice/snack foods/cereals/sweets and other items from the middle of the grocery store (processed carbs)  Lab today

## 2022-05-08 NOTE — Assessment & Plan Note (Signed)
Continues sertraline 50 mg  Reviewed stressors/ coping techniques/symptoms/ support sources/ tx options and side effects in detail today Doing well  Enc self care

## 2022-05-08 NOTE — Assessment & Plan Note (Signed)
Hematology signed off when stable Cbc today

## 2022-05-08 NOTE — Assessment & Plan Note (Signed)
a1c ordered  disc imp of low glycemic diet and wt loss to prevent DM2  

## 2022-05-08 NOTE — Assessment & Plan Note (Signed)
Reviewed health habits including diet and exercise and skin cancer prevention Reviewed appropriate screening tests for age  Also reviewed health mt list, fam hx and immunization status , as well as social and family history   See HPI Labs ordered  Considering shingrix vaccine/discussed this  Mammogram utd Colonoscopy utd  dexa ordered  PHQ score of 0 Encouraged exercise

## 2022-05-08 NOTE — Assessment & Plan Note (Signed)
Disc goals for lipids and reasons to control them Rev last labs with pt Rev low sat fat diet in detail Labs ordered  Tolerates atorvastatin 10 mg daily

## 2022-05-10 ENCOUNTER — Telehealth: Payer: Self-pay | Admitting: Family Medicine

## 2022-05-10 DIAGNOSIS — R7401 Elevation of levels of liver transaminase levels: Secondary | ICD-10-CM

## 2022-05-10 DIAGNOSIS — D469 Myelodysplastic syndrome, unspecified: Secondary | ICD-10-CM

## 2022-05-10 DIAGNOSIS — D472 Monoclonal gammopathy: Secondary | ICD-10-CM

## 2022-05-10 DIAGNOSIS — D696 Thrombocytopenia, unspecified: Secondary | ICD-10-CM

## 2022-05-10 NOTE — Telephone Encounter (Signed)
-----   Message from Mardelle Matte, [place] sent at 05/09/2022 12:19 PM EDT ----- Called and notified pt of results,pt is okay with going to [facility] for hematology , advised pt to stop any calcium and to follow up in one month for lab , made pt 1 month lab appt pt understood lab w/o any concerns.

## 2022-05-22 ENCOUNTER — Other Ambulatory Visit (HOSPITAL_BASED_OUTPATIENT_CLINIC_OR_DEPARTMENT_OTHER): Payer: Self-pay

## 2022-05-22 MED ORDER — ZOSTER VAC RECOMB ADJUVANTED 50 MCG/0.5ML IM SUSR
INTRAMUSCULAR | 0 refills | Status: DC
  Filled 2022-05-22: qty 0.5, 1d supply, fill #0

## 2022-06-04 ENCOUNTER — Encounter (HOSPITAL_COMMUNITY): Payer: Medicare PPO | Admitting: Hematology

## 2022-06-07 ENCOUNTER — Encounter: Payer: Self-pay | Admitting: Family

## 2022-06-07 ENCOUNTER — Ambulatory Visit: Payer: Medicare PPO | Admitting: Family

## 2022-06-07 ENCOUNTER — Telehealth: Payer: Self-pay | Admitting: Family Medicine

## 2022-06-07 VITALS — BP 115/71 | HR 54 | Temp 98.3°F | Ht 62.0 in | Wt 169.5 lb

## 2022-06-07 DIAGNOSIS — H6121 Impacted cerumen, right ear: Secondary | ICD-10-CM

## 2022-06-07 DIAGNOSIS — H65191 Other acute nonsuppurative otitis media, right ear: Secondary | ICD-10-CM

## 2022-06-07 MED ORDER — AMOXICILLIN 500 MG PO CAPS: 1000.0000 mg | ORAL_CAPSULE | Freq: Three times a day (TID) | ORAL | 0 refills | Status: DC

## 2022-06-07 NOTE — Telephone Encounter (Signed)
Patient called and stated she has a right ear infection, pus is coming out of her ear. She also stated she was in pain last night but she took two tylenols today and the pain has eased. Patient was sent to access nurse.

## 2022-06-07 NOTE — Telephone Encounter (Signed)
**Note Weaver-Identified via Obfuscation** [facility] - Client TELEPHONE ADVICE RECORD [facility]urse Patient Name: Kathryn Weaver Gender: Female DOB: [birth year] Age: 76 Y 18 D Return Phone Number: [phone] (Primary), [phone] (Secondary) Address: City/ State/ Zip: [place] [address] Client [facility] - Client Client Site [place] Provider Glori Bickers, Roque Lias - MD Contact Type Call Who Is Calling Patient / Member / Family / Caregiver Call Type Triage / Clinical Relationship To Patient Self Return Phone Number [phone] (Primary) Chief Complaint Ear Discharge Reason for Call Symptomatic / Request for [place] states he has a patient to transfer for triaging She have drainage coming out of her right ear. Translation No Nurse Assessment Nurse: Luan Pulling, RN, [place] Date/Time (Eastern Time): 06/07/2022 8:56:46 AM Confirm and document reason for call. If symptomatic, describe symptoms. ---Caller states she has drainage coming out of her right ear and has ringing in her ear when laying down. Noticed the drainage on Tuesday. Does the patient have any new or worsening symptoms? ---Yes Will a triage be completed? ---Yes Related visit to physician within the last 2 weeks? ---No Does the PT have any chronic conditions? (i.e. diabetes, asthma, this includes High risk factors for pregnancy, etc.) ---No Is this a behavioral health or substance abuse call? ---No Guidelines Guideline Title Affirmed Question Affirmed Notes Nurse Date/Time Eilene Ghazi Time) Ear - Discharge Yellow or green discharge Luan Pulling, RN, [place] 06/07/2022 8:58:14 AM Disp. Time Eilene Ghazi Time) Disposition Final User 06/07/2022 9:01:19 AM See PCP within 24 Hours Yes Luan Pulling, RN, Maryjean Morn Final Disposition 06/07/2022 9:01:19 AM See PCP within 24 Hours Yes Luan Pulling, RN, Maryjean Morn PLEASE NOTE: All timestamps contained within this report  are represented as [place] Standard Time. CONFIDENTIALTY NOTICE: This fax transmission is intended only for the addressee. It contains information that is legally privileged, confidential or otherwise protected from use or disclosure. If you are not the intended recipient, you are strictly prohibited from reviewing, disclosing, copying using or disseminating any of this information or taking any action in reliance on or regarding this information. If you have received this fax in error, please notify us immediately by telephone so that we can arrange for its return to [place]. Phone: [phone], Toll-Free: [phone], Fax: [phone] Page: 2 of 2 Call Id: [id number] Caller Disagree/Comply Comply Caller Understands Yes PreDisposition Go to Urgent Care/Walk-In Clinic Care Advice Given Per Guideline SEE PCP WITHIN 24 HOURS: * IF OFFICE WILL BE OPEN: You need to be examined within the next 24 hours. Call your doctor (or NP/PA) when the office opens and make an appointment. KEEP EAR CANAL DRY: * Keep water out of your ear until fully healed. PAIN MEDICINES: * For pain relief, you can take either acetaminophen, ibuprofen, or naproxen. * They are over-thecounter (OTC) pain drugs. You can buy them at the drugstore. CALL BACK IF: * You become worse CARE ADVICE given per Ear - Discharge (Adult) guideline. Referrals REFERRED TO PCP OFFIC

## 2022-06-07 NOTE — Patient Instructions (Addendum)
It was very nice to see you today!    We were able to clean out the wax from your ear, but your ear drum is infected and I have sent an antibiotic to your pharmacy.  Start this today.  If you develop any yeast infection symptoms just call to the office and I will send over Diflucan for you to take.  You can continue to take Tylenol 1,'000mg'$  3 times per day for ear pain or you can apply ice or heat to your ear for up to 20 minutes several times per day.  Let us know if you ear is still hurting after you finish the antibiotic.      PLEASE NOTE:  If you had any lab tests please let us know if you have not heard back within a few days. You may see your results on MyChart before we have a chance to review them but we will give you a call once they are reviewed by [place]. If we ordered any referrals today, please let us know if you have not heard from their office within the next week.

## 2022-06-07 NOTE — Telephone Encounter (Signed)
Per chart review pt has already been seen by Jeanie Sewer NP. Sending note as FYI to Jeanie Sewer NP.

## 2022-06-07 NOTE — Progress Notes (Signed)
Patient ID: Kathryn Weaver, female    DOB: [birth year], 76 y.o.   MRN: [id number]  Chief Complaint  Patient presents with   Ear Pain    Pt c/o right ear pain and drainage(tan color) since 7/25. Pt has tried tylenol which did ease the pain. Pt states it is a throbbing.     HPI: Ear pain - pt describes a 1 week history of Right ear pain with fullness, hearing loss. Denies associated discharge, itching, fever or dizziness. Reports having sinus sx also but these have mostly resolved.     Assessment & Plan:  1. Impacted cerumen, right ear Verbal consent received to perform right ear lavage via Hydrogen peroxide/water mix solution. Pt tolerated well, complete evacuation of all cerumen obtained. Mild erythema but no bleeding noted in ear canal after procedure.  - Ear Lavage  2. Other non-recurrent acute nonsuppurative otitis media of right ear after cerumen removed, TM retracted, with drainage noted, mild bleeding & erythema. pt reports having sinus sx prior to ear hurting, but not bothering her now. Sending AMOX, advised on use & SE. Reports long ago hx of vaginitis after abt, but does not remember which one, advised pt to let me know if sx start & will send Diflucan.  - amoxicillin (AMOXIL) 500 MG capsule; Take 2 capsules (1,000 mg total) by mouth 3 (three) times daily.  Dispense: 30 capsule; Refill: 0   Subjective:    Outpatient Medications Prior to Visit  Medication Sig Dispense Refill   atorvastatin (LIPITOR) 10 MG tablet TAKE 1 TABLET BY MOUTH EVERY DAY 90 tablet 1   gabapentin (NEURONTIN) 300 MG capsule TAKE 1 CAPSULE BY MOUTH THREE TIMES A DAY 90 capsule 3   hydrochlorothiazide (HYDRODIURIL) 25 MG tablet TAKE 1 TABLET (25 MG TOTAL) BY MOUTH DAILY. 90 tablet 1   sertraline (ZOLOFT) 50 MG tablet TAKE 1 TABLET BY MOUTH EVERY DAY 90 tablet 3   Zoster Vaccine Adjuvanted [facility]) injection Inject into the muscle. 0.5 mL 0   No facility-administered medications prior to visit.    Past Medical History:  Diagnosis Date   Adenomatous polyp of colon 03/2007   Anemia    s/p GI bleed, hospital 5/08   Anxiety    Depression    Diverticulosis    Endometriosis    uterine US 06/2003   GERD (gastroesophageal reflux disease)    Hiatal hernia    EGD 5/08- stricture, HH, gastritis, GERD   History of blood transfusion    Hyperlipidemia    Hypertension    Hypertension    Iron deficiency 03/16/2014   Myelodysplastic disease ([place])    Past Surgical History:  Procedure Laterality Date   cardiac CT  9/12   normal    CHOLECYSTECTOMY N/A 09/18/2021   Procedure: LAPAROSCOPIC CHOLECYSTECTOMY;  Surgeon: [name], MD;  Location: [facility];  Service: General;  Laterality: N/A;   HAMMER TOE SURGERY Left 12/02/2017   INTRAOPERATIVE CHOLANGIOGRAM N/A 09/18/2021   Procedure: INTRAOPERATIVE CHOLANGIOGRAM;  Surgeon: [name], MD;  Location: [facility];  Service: General;  Laterality: N/A;   KNEE SURGERY Left    Cyst behind left knee.   TUBAL LIGATION     No Known Allergies    Objective:    Physical Exam Vitals and nursing note reviewed.  Constitutional:      Appearance: Normal appearance.  HENT:     Right Ear: Decreased hearing noted. Drainage present. No tenderness. There is impacted cerumen. Tympanic membrane is injected, erythematous and retracted.  Left Ear: Tympanic membrane and ear canal normal.  Cardiovascular:     Rate and Rhythm: Normal rate and regular rhythm.  Pulmonary:     Effort: Pulmonary effort is normal.     Breath sounds: Normal breath sounds.  Musculoskeletal:        General: Normal range of motion.  Skin:    General: Skin is warm and dry.  Neurological:     Mental Status: She is alert.  Psychiatric:        Mood and Affect: Mood normal.        Behavior: Behavior normal.    BP 115/71 (BP Location: Left Arm, Patient Position: Sitting, Cuff Size: Large)   Pulse (!) 54   Temp 98.3 F (36.8 C) (Temporal)   Ht '5\' 2"'$  (1.575 m)   Wt 169 lb 8 oz  (76.9 kg)   SpO2 99%   BMI 31.00 kg/m  Wt Readings from Last 3 Encounters:  06/07/22 169 lb 8 oz (76.9 kg)  05/08/22 169 lb 3.2 oz (76.7 kg)  03/13/22 168 lb (76.2 kg)       Jeanie Sewer, NP

## 2022-06-10 ENCOUNTER — Other Ambulatory Visit (INDEPENDENT_AMBULATORY_CARE_PROVIDER_SITE_OTHER): Payer: Medicare PPO

## 2022-06-10 DIAGNOSIS — R7401 Elevation of levels of liver transaminase levels: Secondary | ICD-10-CM

## 2022-06-10 LAB — VITAMIN D 25 HYDROXY (VIT D DEFICIENCY, FRACTURES): VITD: 38.91 ng/mL (ref 30.00–100.00)

## 2022-06-10 LAB — HEPATIC FUNCTION PANEL
ALT: 35 U/L (ref 0–35)
AST: 33 U/L (ref 0–37)
Albumin: 3.7 g/dL (ref 3.5–5.2)
Alkaline Phosphatase: 101 U/L (ref 39–117)
Bilirubin, Direct: 0.1 mg/dL (ref 0.0–0.3)
Total Bilirubin: 0.7 mg/dL (ref 0.2–1.2)
Total Protein: 8.1 g/dL (ref 6.0–8.3)

## 2022-06-11 LAB — PTH, INTACT AND CALCIUM
Calcium: 10.6 mg/dL — ABNORMAL HIGH (ref 8.6–10.4)
PTH: 83 pg/mL — ABNORMAL HIGH (ref 16–77)

## 2022-06-12 ENCOUNTER — Telehealth: Payer: Self-pay

## 2022-06-12 NOTE — Telephone Encounter (Signed)
Tried to called pt wasn't able to leave a vm.

## 2022-06-12 NOTE — Telephone Encounter (Signed)
-----   Message from Abner Greenspan, MD sent at 06/11/2022  5:39 PM EDT ----- Labs indicate hyperparathyroidism  Please f/u with me to discuss  We will do a specialist referral as well but I want to see her fist  Stop any calcium supplements

## 2022-06-14 ENCOUNTER — Inpatient Hospital Stay: Payer: Medicare PPO | Attending: Hematology | Admitting: Hematology

## 2022-06-14 ENCOUNTER — Encounter (HOSPITAL_COMMUNITY): Payer: Medicare PPO | Admitting: Hematology

## 2022-06-14 NOTE — Progress Notes (Shared)
[facility] [address] [place] [address]   CLINIC:  Medical Oncology/Hematology  Patient Care Team: Tower, [name], MD as PCP - General Johnsie Cancel Wallis Bamberg, MD as PCP - Cardiology (Cardiology)  CHIEF COMPLAINTS/PURPOSE OF CONSULTATION:  Evaluation for thrombocytopenia, myelodysplastic syndrome, and abnormal SPEP  HISTORY OF PRESENTING ILLNESS:  Kathryn Weaver 76 y.o. female is here because of thrombocytopenia, myelodysplastic syndrome, and abnormal SPEP, at the request of Dr. Nevada Crane.  ***  MEDICAL HISTORY:  Past Medical History:  Diagnosis Date   Adenomatous polyp of colon 03/2007   Anemia    s/p GI bleed, hospital 5/08   Anxiety    Depression    Diverticulosis    Endometriosis    uterine US 06/2003   GERD (gastroesophageal reflux disease)    Hiatal hernia    EGD 5/08- stricture, HH, gastritis, GERD   History of blood transfusion    Hyperlipidemia    Hypertension    Hypertension    Iron deficiency 03/16/2014   Myelodysplastic disease ([place])     SURGICAL HISTORY: Past Surgical History:  Procedure Laterality Date   cardiac CT  9/12   normal    CHOLECYSTECTOMY N/A 09/18/2021   Procedure: LAPAROSCOPIC CHOLECYSTECTOMY;  Surgeon: [name], MD;  Location: [facility];  Service: General;  Laterality: N/A;   HAMMER TOE SURGERY Left 12/02/2017   INTRAOPERATIVE CHOLANGIOGRAM N/A 09/18/2021   Procedure: INTRAOPERATIVE CHOLANGIOGRAM;  Surgeon: [name], MD;  Location: [facility];  Service: General;  Laterality: N/A;   KNEE SURGERY Left    Cyst behind left knee.   TUBAL LIGATION      SOCIAL HISTORY: Social History   Socioeconomic History   Marital status: Married    Spouse name: Not on file   Number of children: Not on file   Years of education: Not on file   Highest education level: Not on file  Occupational History   Occupation: [employer]  Tobacco Use   Smoking status: Never   Smokeless tobacco: Never  Vaping Use   Vaping Use:  Never used  Substance and Sexual Activity   Alcohol use: No    Alcohol/week: 0.0 standard drinks of alcohol   Drug use: No   Sexual activity: Yes  Other Topics Concern   Not on file  Social History Narrative   Not on file   Social Determinants of Health   Financial Resource Strain: Low Risk  (03/13/2022)   Overall Financial Resource Strain (CARDIA)    Difficulty of Paying Living Expenses: Not hard at all  Food Insecurity: No Food Insecurity (03/13/2022)   Hunger Vital Sign    Worried About Running Out of Food in the Last Year: Never true    [place] in the Last Year: Never true  Transportation Needs: No Transportation Needs (03/13/2022)   PRAPARE - Hydrologist (Medical): No    Lack of Transportation (Non-Medical): No  Physical Activity: Inactive (03/13/2022)   Exercise Vital Sign    Days of Exercise per Week: 0 days    Minutes of Exercise per Session: 0 min  Stress: No Stress Concern Present (03/13/2022)   [place]    Feeling of Stress : Not at all  Social Connections: [place] (03/13/2022)   Social Connection and Isolation Panel [NHANES]    Frequency of Communication with Friends and Family: More than three times a week  Frequency of Social Gatherings with Friends and Family: More than three times a week    Attends Religious Services: More than 4 times per year    Active Member of [employer] or Organizations: Yes    Attends Music therapist: More than 4 times per year    Marital Status: Married  Human resources officer Violence: Not At Risk (03/13/2022)   Humiliation, Afraid, Rape, and Kick questionnaire    Fear of Current or Ex-Partner: No    Emotionally Abused: No    Physically Abused: No    Sexually Abused: No    FAMILY HISTORY: Family History  Problem Relation Age of Onset   Cancer Father        unknown type   Hypertension Mother    Colon cancer Neg Hx     Esophageal cancer Neg Hx    Rectal cancer Neg Hx    Stomach cancer Neg Hx     ALLERGIES:  has No Known Allergies.  MEDICATIONS:  Current Outpatient Medications  Medication Sig Dispense Refill   amoxicillin (AMOXIL) 500 MG capsule Take 2 capsules (1,000 mg total) by mouth 3 (three) times daily. 30 capsule 0   atorvastatin (LIPITOR) 10 MG tablet TAKE 1 TABLET BY MOUTH EVERY DAY 90 tablet 1   gabapentin (NEURONTIN) 300 MG capsule TAKE 1 CAPSULE BY MOUTH THREE TIMES A DAY 90 capsule 3   hydrochlorothiazide (HYDRODIURIL) 25 MG tablet TAKE 1 TABLET (25 MG TOTAL) BY MOUTH DAILY. 90 tablet 1   sertraline (ZOLOFT) 50 MG tablet TAKE 1 TABLET BY MOUTH EVERY DAY 90 tablet 3   Zoster Vaccine Adjuvanted [facility]) injection Inject into the muscle. 0.5 mL 0   No current facility-administered medications for this visit.    REVIEW OF SYSTEMS:   Review of Systems - Oncology   PHYSICAL EXAMINATION: ECOG PERFORMANCE STATUS: {CHL ONC ECOG PS:[id number]}  There were no vitals filed for this visit. There were no vitals filed for this visit. Physical Exam   LABORATORY DATA:  I have reviewed the data as listed Recent Results (from the past 2160 hour(s))  TSH     Status: None   Collection Time: 05/08/22  8:38 AM  Result Value Ref Range   TSH 3.56 0.35 - 5.50 uIU/mL  Lipid panel     Status: None   Collection Time: 05/08/22  8:38 AM  Result Value Ref Range   Cholesterol 163 0 - 200 mg/dL    Comment: ATP III Classification       Desirable:  < 200 mg/dL               Borderline High:  200 - 239 mg/dL          High:  > = 240 mg/dL   Triglycerides 70.0 0.0 - 149.0 mg/dL    Comment: Normal:  <150 mg/dLBorderline High:  150 - 199 mg/dL   HDL 63.10 >39.00 mg/dL   VLDL 14.0 0.0 - 40.0 mg/dL   LDL Cholesterol 86 0 - 99 mg/dL   Total CHOL/HDL Ratio 3     Comment:                Men          Women1/2 Average Risk     3.4          3.3Average Risk          5.0          4.42X Average Risk  9.6           7.13X Average Risk          15.0          11.0                       NonHDL 99.99     Comment: NOTE:  Non-HDL goal should be 30 mg/dL higher than patient's LDL goal (i.e. LDL goal of < 70 mg/dL, would have non-HDL goal of < 100 mg/dL)  Comprehensive metabolic panel     Status: Abnormal   Collection Time: 05/08/22  8:38 AM  Result Value Ref Range   Sodium 138 135 - 145 mEq/L   Potassium 3.6 3.5 - 5.1 mEq/L   Chloride 101 96 - 112 mEq/L   CO2 32 19 - 32 mEq/L   Glucose, Bld 112 (H) 70 - 99 mg/dL   BUN 14 6 - 23 mg/dL   Creatinine, Ser 0.86 0.40 - 1.20 mg/dL   Total Bilirubin 0.7 0.2 - 1.2 mg/dL   Alkaline Phosphatase 91 39 - 117 U/L   AST 22 0 - 37 U/L   ALT 22 0 - 35 U/L   Total Protein 7.7 6.0 - 8.3 g/dL   Albumin 3.9 3.5 - 5.2 g/dL   GFR 66.30 >60.00 mL/min    Comment: Calculated using the CKD-EPI Creatinine Equation (2021)   Calcium 10.9 (H) 8.4 - 10.5 mg/dL  CBC with Differential/Platelet     Status: Abnormal   Collection Time: 05/08/22  8:38 AM  Result Value Ref Range   WBC 3.1 (L) 4.0 - 10.5 K/uL   RBC 3.96 3.87 - 5.11 Mil/uL   Hemoglobin 12.8 12.0 - 15.0 g/dL   HCT 38.6 36.0 - 46.0 %   MCV 97.6 78.0 - 100.0 fl   MCHC 33.2 30.0 - 36.0 g/dL   RDW 12.8 11.5 - 15.5 %   Platelets 97.0 (L) 150.0 - 400.0 K/uL   Neutrophils Relative % 32.6 (L) 43.0 - 77.0 %   Lymphocytes Relative 50.3 (H) 12.0 - 46.0 %   Monocytes Relative 12.9 (H) 3.0 - 12.0 %   Eosinophils Relative 3.6 0.0 - 5.0 %   Basophils Relative 0.6 0.0 - 3.0 %   Neutro Abs 1.0 (L) 1.4 - 7.7 K/uL   Lymphs Abs 1.5 0.7 - 4.0 K/uL   Monocytes Absolute 0.4 0.1 - 1.0 K/uL   Eosinophils Absolute 0.1 0.0 - 0.7 K/uL   Basophils Absolute 0.0 0.0 - 0.1 K/uL  Hemoglobin A1c     Status: None   Collection Time: 05/08/22  8:38 AM  Result Value Ref Range   Hgb A1c MFr Bld 6.1 4.6 - 6.5 %    Comment: Glycemic Control Guidelines for People with Diabetes:Non Diabetic:  <6%Goal of Therapy: <7%Additional Action Suggested:  >8%    Hepatic function panel     Status: None   Collection Time: 06/10/22  8:03 AM  Result Value Ref Range   Total Bilirubin 0.7 0.2 - 1.2 mg/dL   Bilirubin, Direct 0.1 0.0 - 0.3 mg/dL   Alkaline Phosphatase 101 39 - 117 U/L   AST 33 0 - 37 U/L   ALT 35 0 - 35 U/L   Total Protein 8.1 6.0 - 8.3 g/dL   Albumin 3.7 3.5 - 5.2 g/dL  Vitamin D, 25-hydroxy     Status: None   Collection Time: 06/10/22  8:03 AM  Result Value Ref Range   VITD 38.91  30.00 - 100.00 ng/mL  PTH, Intact and Calcium     Status: Abnormal   Collection Time: 06/10/22  8:03 AM  Result Value Ref Range   PTH 83 (H) 16 - 77 pg/mL    Comment: . Interpretive Guide    Intact PTH           Calcium ------------------    ----------           ------- Normal Parathyroid    Normal               Normal Hypoparathyroidism    Low or Low Normal    Low Hyperparathyroidism    Primary            Normal or High       High    Secondary          High                 Normal or Low    Tertiary           High                 High Non-Parathyroid    Hypercalcemia      Low or Low Normal    High .    Calcium 10.6 (H) 8.6 - 10.4 mg/dL    RADIOGRAPHIC STUDIES: I have personally reviewed the radiological images as listed and agreed with the findings in the report. No results found.  ASSESSMENT:  Thrombocytopenia  Myelodysplastic syndrome   Abnormal SPEP  Social/family history   PLAN:  1. Thrombocytopenia  2. Myelodysplastic syndrome   3. Abnormal SPEP   All questions were answered. The patient knows to call the clinic with any problems, questions or concerns.  Derek Jack, MD 06/14/22 7:22 AM  [place] [phone]   I, Thana Ates, am acting as a scribe for Dr. Derek Jack.  {Add Barista Statement}

## 2022-06-15 ENCOUNTER — Other Ambulatory Visit: Payer: Self-pay | Admitting: Family Medicine

## 2022-06-15 ENCOUNTER — Other Ambulatory Visit: Payer: Self-pay | Admitting: Family

## 2022-06-15 DIAGNOSIS — B0229 Other postherpetic nervous system involvement: Secondary | ICD-10-CM

## 2022-06-15 DIAGNOSIS — H65191 Other acute nonsuppurative otitis media, right ear: Secondary | ICD-10-CM

## 2022-06-17 ENCOUNTER — Ambulatory Visit: Payer: Medicare PPO | Admitting: Family Medicine

## 2022-06-17 ENCOUNTER — Encounter: Payer: Self-pay | Admitting: Family Medicine

## 2022-06-17 VITALS — BP 112/66 | HR 63 | Temp 98.1°F | Ht 62.0 in | Wt 168.6 lb

## 2022-06-17 DIAGNOSIS — D696 Thrombocytopenia, unspecified: Secondary | ICD-10-CM | POA: Diagnosis not present

## 2022-06-17 DIAGNOSIS — H6981 Other specified disorders of Eustachian tube, right ear: Secondary | ICD-10-CM | POA: Diagnosis not present

## 2022-06-17 DIAGNOSIS — D469 Myelodysplastic syndrome, unspecified: Secondary | ICD-10-CM

## 2022-06-17 DIAGNOSIS — H6991 Unspecified Eustachian tube disorder, right ear: Secondary | ICD-10-CM | POA: Insufficient documentation

## 2022-06-17 DIAGNOSIS — E210 Primary hyperparathyroidism: Secondary | ICD-10-CM

## 2022-06-17 NOTE — Assessment & Plan Note (Addendum)
Lab Results  Component Value Date   PTH 83 (H) 06/10/2022   CALCIUM 10.6 (H) 06/10/2022  in pt with myelodysplastic disorder  Mild  Some fatigue  Disc risk for OP - had osteopenia at last dexa (no falls or fx) Plan to ref to endocrinology for further eval  inst to hold any supplements with calcium  Handout given

## 2022-06-17 NOTE — Progress Notes (Signed)
Subjective:    Patient ID: Early Chars, female    DOB: January 15, 1946, 76 y.o.   MRN: [id number]  HPI Pt presents to discuss her labs  Also head ears cleared out last week at [place] location   Wt Readings from Last 3 Encounters:  06/17/22 168 lb 9.6 oz (76.5 kg)  06/07/22 169 lb 8 oz (76.9 kg)  05/08/22 169 lb 3.2 oz (76.7 kg)   30.84 kg/m  Ear was flushed  Found OM and also tx with amox on 7/28   Ear is no longer draining  No pain  Hearing is not back to normal - has to plug it to hear her voice  Is throbbing/ thudding  Like an echo   No trauma in the past  Keeps a stuffy and runny nose  No allergy medicines  No flonase recently     Last visit platelet ct went below 100 Lab Results  Component Value Date   WBC 3.1 (L) 05/08/2022   HGB 12.8 05/08/2022   HCT 38.6 05/08/2022   MCV 97.6 05/08/2022   PLT 97.0 (L) 05/08/2022   Enc to get back with hematology  She was supposed to go but husband got sick  Needs to call and re schedule   Ca level was slt elevated  Told her to hold off on any calcium supplement for now  Want to re check this   Lab Results  Component Value Date   PTH 83 (H) 06/10/2022   CALCIUM 10.6 (H) 06/10/2022   Mildly elevated parathyroid hormone   She never felt quite right since her shingles in the fall  No aches or pains  Is tired all the time  Has had depression in past    Patient Active Problem List   Diagnosis Date Noted   Primary hyperparathyroidism ([place]) 06/17/2022   ETD (Eustachian tube dysfunction), right 06/17/2022   Hypercalcemia 05/10/2022   Other postherpetic nervous system involvement 12/12/2021   Post herpetic neuralgia 12/12/2021   Cholelithiasis 06/04/2021   Diminished pulses in lower extremity 05/24/2020   Myelodysplastic syndrome, unspecified ([place]) 08/31/2019   Estrogen deficiency 08/31/2019   Diverticulitis 04/27/2018   Constipation 03/18/2018   MGUS (monoclonal gammopathy of unknown significance)  09/17/2017   Prediabetes 10/21/2016   Somnolence, daytime 04/11/2016   Internal hemorrhoid 04/20/2014   Screening mammogram, encounter for 03/01/2014   Uterine prolapse 03/01/2014   Stress reaction 09/01/2012   Hyperlipidemia 04/28/2012   Routine general medical examination at a health care facility 04/20/2012   Obesity 02/05/2012   Other neutropenia ([place]) 10/23/2010   Thrombocytopenia ([place]) 10/24/2009   Elevated transaminase level 10/24/2009   POSTMENOPAUSAL STATUS 08/09/2008   Depression with anxiety 08/05/2007   Essential hypertension 08/05/2007   GERD 04/20/2007   DIVERTICULAR DISEASE 04/20/2007   History of colonic polyps 04/13/2007   Past Medical History:  Diagnosis Date   Adenomatous polyp of colon 03/2007   Anemia    s/p GI bleed, hospital 5/08   Anxiety    Depression    Diverticulosis    Endometriosis    uterine US 06/2003   GERD (gastroesophageal reflux disease)    Hiatal hernia    EGD 5/08- stricture, HH, gastritis, GERD   History of blood transfusion    Hyperlipidemia    Hypertension    Hypertension    Iron deficiency 03/16/2014   Myelodysplastic disease ([place])    Past Surgical History:  Procedure Laterality Date   cardiac CT  9/12   normal  CHOLECYSTECTOMY N/A 09/18/2021   Procedure: LAPAROSCOPIC CHOLECYSTECTOMY;  Surgeon: [name], MD;  Location: [facility];  Service: General;  Laterality: N/A;   HAMMER TOE SURGERY Left 12/02/2017   INTRAOPERATIVE CHOLANGIOGRAM N/A 09/18/2021   Procedure: INTRAOPERATIVE CHOLANGIOGRAM;  Surgeon: [name], MD;  Location: [facility];  Service: General;  Laterality: N/A;   KNEE SURGERY Left    Cyst behind left knee.   TUBAL LIGATION     Social History   Tobacco Use   Smoking status: Never   Smokeless tobacco: Never  Vaping Use   Vaping Use: Never used  Substance Use Topics   Alcohol use: No    Alcohol/week: 0.0 standard drinks of alcohol   Drug use: No   Family History  Problem Relation Age of Onset   Cancer  Father        unknown type   Hypertension Mother    Colon cancer Neg Hx    Esophageal cancer Neg Hx    Rectal cancer Neg Hx    Stomach cancer Neg Hx    No Known Allergies Current Outpatient Medications on File Prior to Visit  Medication Sig Dispense Refill   amoxicillin (AMOXIL) 500 MG capsule Take 2 capsules (1,000 mg total) by mouth 3 (three) times daily. 30 capsule 0   atorvastatin (LIPITOR) 10 MG tablet TAKE 1 TABLET BY MOUTH EVERY DAY 90 tablet 1   gabapentin (NEURONTIN) 300 MG capsule TAKE 1 CAPSULE BY MOUTH THREE TIMES A DAY 90 capsule 3   hydrochlorothiazide (HYDRODIURIL) 25 MG tablet TAKE 1 TABLET (25 MG TOTAL) BY MOUTH DAILY. 90 tablet 1   sertraline (ZOLOFT) 50 MG tablet TAKE 1 TABLET BY MOUTH EVERY DAY 90 tablet 3   Zoster Vaccine Adjuvanted [facility]) injection Inject into the muscle. 0.5 mL 0   No current facility-administered medications on file prior to visit.     Review of Systems  Constitutional:  Positive for fatigue. Negative for activity change, appetite change, fever and unexpected weight change.  HENT:  Positive for hearing loss and tinnitus. Negative for congestion, ear discharge, ear pain, rhinorrhea, sinus pressure and sore throat.   Eyes:  Negative for pain, redness and visual disturbance.  Respiratory:  Negative for cough, shortness of breath and wheezing.   Cardiovascular:  Negative for chest pain and palpitations.  Gastrointestinal:  Negative for abdominal pain, blood in stool, constipation and diarrhea.  Endocrine: Negative for polydipsia and polyuria.  Genitourinary:  Negative for dysuria, frequency and urgency.  Musculoskeletal:  Negative for arthralgias, back pain and myalgias.       Some aches and pains  Skin:  Negative for pallor and rash.  Allergic/Immunologic: Negative for environmental allergies.  Neurological:  Negative for dizziness, syncope and headaches.  Hematological:  Negative for adenopathy. Does not bruise/bleed easily.   Psychiatric/Behavioral:  Negative for decreased concentration and dysphoric mood. The patient is not nervous/anxious.        Objective:   Physical Exam Constitutional:      General: She is not in acute distress.    Appearance: Normal appearance. She is well-developed. She is obese. She is not ill-appearing or diaphoretic.  HENT:     Head: Normocephalic and atraumatic.     Right Ear: Ear canal and external ear normal.     Left Ear: Tympanic membrane, ear canal and external ear normal.     Ears:     Comments: R TM is dull with small effusion     Nose:     Comments:  Nares are boggy    Mouth/Throat:     Mouth: Mucous membranes are moist.     Pharynx: Oropharynx is clear.  Eyes:     Conjunctiva/sclera: Conjunctivae normal.     Pupils: Pupils are equal, round, and reactive to light.  Neck:     Thyroid: No thyromegaly.     Vascular: No carotid bruit or JVD.     Comments: No thyromegally Cardiovascular:     Rate and Rhythm: Normal rate and regular rhythm.     Heart sounds: Normal heart sounds.     No gallop.  Pulmonary:     Effort: Pulmonary effort is normal. No respiratory distress.     Breath sounds: Normal breath sounds. No wheezing or rales.  Abdominal:     General: There is no distension or abdominal bruit.     Palpations: Abdomen is soft.  Musculoskeletal:     Cervical back: Normal range of motion and neck supple.     Right lower leg: No edema.     Left lower leg: No edema.  Lymphadenopathy:     Cervical: No cervical adenopathy.  Skin:    General: Skin is warm and dry.     Coloration: Skin is not pale.     Findings: No rash.  Neurological:     Mental Status: She is alert.     Cranial Nerves: No cranial nerve deficit.     Motor: No weakness.     Coordination: Coordination normal.     Deep Tendon Reflexes: Reflexes are normal and symmetric. Reflexes normal.  Psychiatric:        Mood and Affect: Mood normal.           Assessment & Plan:   Problem List  Items Addressed This Visit       Endocrine   Primary hyperparathyroidism ([place])    Lab Results  Component Value Date   PTH 83 (H) 06/10/2022   CALCIUM 10.6 (H) 06/10/2022  in pt with myelodysplastic disorder  Mild  Some fatigue  Disc risk for OP - had osteopenia at last dexa (no falls or fx) Plan to ref to endocrinology for further eval  inst to hold any supplements with calcium  Handout given         Nervous and Auditory   ETD (Eustachian tube dysfunction), right    R side  S/p OM recent  Now fullness/tinnitis symptoms   Plan to try flonase ns bid (2 sp each nostril)  If not improved 10 d will alert [place] and consider ENT ref        Hematopoietic and Hemostatic   Thrombocytopenia ([place]) - Primary    Pt had to cancel her hematology appt due to husb illness She will call to re schedule  No symptoms  plt ct 97         Other   Myelodysplastic syndrome, unspecified ([place])    With MGUS Wbc 3.1 plt 97 Some fatigue  No bleeding or clots Plans to re schedule her hematology appt

## 2022-06-17 NOTE — Assessment & Plan Note (Signed)
Pt had to cancel her hematology appt due to husb illness She will call to re schedule  No symptoms  plt ct 97

## 2022-06-17 NOTE — Telephone Encounter (Signed)
why is she requesting refill? this was for ear infection, if still hurting, she needs to be seen in office by her PCP, thx

## 2022-06-17 NOTE — Patient Instructions (Addendum)
Call and re schedule your hematology appt   You have fluid behind your ear drum Use the generic flonase nasal spray (2 sprays in each nostril once daily) If no improvement in ear symptoms in 10 days- call us  If worse or pain -call us  No more antibiotics needed right now   I will place a referral to endocrinology regarding your calcium level You may have overactive parathyroid glands  See the handout   If you don't get a call in 1-2 weeks about the referral please call us

## 2022-06-17 NOTE — Assessment & Plan Note (Signed)
R side  S/p OM recent  Now fullness/tinnitis symptoms   Plan to try flonase ns bid (2 sp each nostril)  If not improved 10 d will alert [place] and consider ENT ref

## 2022-06-17 NOTE — Telephone Encounter (Signed)
05/08/22 last ov 04/29/22 last filled  Next ov 06/17/22

## 2022-06-17 NOTE — Assessment & Plan Note (Signed)
With MGUS Wbc 3.1 plt 97 Some fatigue  No bleeding or clots Plans to re schedule her hematology appt

## 2022-06-18 ENCOUNTER — Telehealth: Payer: Self-pay | Admitting: Family Medicine

## 2022-06-18 NOTE — Telephone Encounter (Signed)
[facility] called in stated needs pt insurance and lab sent  . Please advise #[phone] fax# [phone] [employer]. [place]

## 2022-06-18 NOTE — Telephone Encounter (Signed)
Faxed  labs and insurance card to their office

## 2022-06-19 DIAGNOSIS — Z8249 Family history of ischemic heart disease and other diseases of the circulatory system: Secondary | ICD-10-CM | POA: Diagnosis not present

## 2022-06-19 DIAGNOSIS — E785 Hyperlipidemia, unspecified: Secondary | ICD-10-CM | POA: Diagnosis not present

## 2022-06-19 DIAGNOSIS — I10 Essential (primary) hypertension: Secondary | ICD-10-CM | POA: Diagnosis not present

## 2022-06-19 DIAGNOSIS — E669 Obesity, unspecified: Secondary | ICD-10-CM | POA: Diagnosis not present

## 2022-06-19 DIAGNOSIS — Z6830 Body mass index (BMI) 30.0-30.9, adult: Secondary | ICD-10-CM | POA: Diagnosis not present

## 2022-07-22 ENCOUNTER — Other Ambulatory Visit: Payer: Self-pay | Admitting: Family Medicine

## 2022-07-25 DIAGNOSIS — H01021 Squamous blepharitis right upper eyelid: Secondary | ICD-10-CM | POA: Diagnosis not present

## 2022-07-25 DIAGNOSIS — H01024 Squamous blepharitis left upper eyelid: Secondary | ICD-10-CM | POA: Diagnosis not present

## 2022-08-08 DIAGNOSIS — E210 Primary hyperparathyroidism: Secondary | ICD-10-CM | POA: Diagnosis not present

## 2022-08-08 DIAGNOSIS — D469 Myelodysplastic syndrome, unspecified: Secondary | ICD-10-CM | POA: Diagnosis not present

## 2022-08-08 DIAGNOSIS — I10 Essential (primary) hypertension: Secondary | ICD-10-CM | POA: Diagnosis not present

## 2022-09-23 DIAGNOSIS — E210 Primary hyperparathyroidism: Secondary | ICD-10-CM | POA: Diagnosis not present

## 2022-11-18 ENCOUNTER — Ambulatory Visit
Admission: RE | Admit: 2022-11-18 | Discharge: 2022-11-18 | Disposition: A | Discharge status: Home / Self Care | Payer: Medicare PPO | Source: Ambulatory Visit | Attending: Family Medicine | Admitting: Family Medicine

## 2022-11-18 DIAGNOSIS — Z780 Asymptomatic menopausal state: Secondary | ICD-10-CM | POA: Diagnosis not present

## 2022-11-18 DIAGNOSIS — M85852 Other specified disorders of bone density and structure, left thigh: Secondary | ICD-10-CM | POA: Diagnosis not present

## 2022-11-18 DIAGNOSIS — E2839 Other primary ovarian failure: Secondary | ICD-10-CM

## 2022-11-20 ENCOUNTER — Encounter: Payer: Self-pay | Admitting: Family Medicine

## 2022-11-20 DIAGNOSIS — M8580 Other specified disorders of bone density and structure, unspecified site: Secondary | ICD-10-CM | POA: Insufficient documentation

## 2022-11-22 ENCOUNTER — Encounter: Payer: Self-pay | Admitting: *Deleted

## 2022-11-25 ENCOUNTER — Other Ambulatory Visit (HOSPITAL_BASED_OUTPATIENT_CLINIC_OR_DEPARTMENT_OTHER): Payer: Self-pay

## 2022-11-25 MED ORDER — ZOSTER VAC RECOMB ADJUVANTED 50 MCG/0.5ML IM SUSR
INTRAMUSCULAR | 0 refills | Status: DC
  Filled 2022-11-25: qty 0.5, 1d supply, fill #0

## 2022-12-13 ENCOUNTER — Other Ambulatory Visit: Payer: Self-pay | Admitting: Family Medicine

## 2022-12-25 DIAGNOSIS — H01024 Squamous blepharitis left upper eyelid: Secondary | ICD-10-CM | POA: Diagnosis not present

## 2022-12-25 DIAGNOSIS — H01021 Squamous blepharitis right upper eyelid: Secondary | ICD-10-CM | POA: Diagnosis not present

## 2022-12-25 DIAGNOSIS — H1045 Other chronic allergic conjunctivitis: Secondary | ICD-10-CM | POA: Diagnosis not present

## 2023-03-16 DIAGNOSIS — R300 Dysuria: Secondary | ICD-10-CM | POA: Diagnosis not present

## 2023-03-16 DIAGNOSIS — R319 Hematuria, unspecified: Secondary | ICD-10-CM | POA: Diagnosis not present

## 2023-03-16 DIAGNOSIS — N390 Urinary tract infection, site not specified: Secondary | ICD-10-CM | POA: Diagnosis not present

## 2023-03-17 ENCOUNTER — Ambulatory Visit (INDEPENDENT_AMBULATORY_CARE_PROVIDER_SITE_OTHER): Payer: Medicare PPO

## 2023-03-17 VITALS — Ht 63.0 in | Wt 170.0 lb

## 2023-03-17 DIAGNOSIS — Z0000 Encounter for general adult medical examination without abnormal findings: Secondary | ICD-10-CM | POA: Diagnosis not present

## 2023-03-17 NOTE — Progress Notes (Signed)
I connected with  Rob Hickman on 03/17/23 by a audio enabled telemedicine application and verified that I am speaking with the correct person using two identifiers.  Patient Location: Home  Provider Location: Home Office  I discussed the limitations of evaluation and management by telemedicine. The patient expressed understanding and agreed to proceed.  Subjective:   Kathryn Weaver is a 77 y.o. female who presents for Medicare Annual (Subsequent) preventive examination.  Review of Systems      Cardiac Risk Factors include: advanced age (>64men, >75 women);hypertension     Objective:    Today's Vitals   03/17/23 1028  Weight: 170 lb (77.1 kg)  Height: 5\' 3"  (1.6 m)  PainSc: 0-No pain   Body mass index is 30.11 kg/m.     03/17/2023   10:38 AM 03/13/2022   10:52 AM 09/18/2021   11:22 AM 08/26/2021    3:27 PM 05/19/2021   11:18 PM 10/25/2020    9:05 AM 08/24/2019   11:32 AM  Advanced Directives  Does Patient Have a Medical Advance Directive? No No No No No No No  Would patient like information on creating a medical advance directive? No - Patient declined No - Patient declined No - Patient declined Yes (ED - Information included in AVS) No - Patient declined No - Patient declined No - Patient declined    Current Medications (verified) Outpatient Encounter Medications as of 03/17/2023  Medication Sig   amoxicillin (AMOXIL) 500 MG capsule Take 2 capsules (1,000 mg total) by mouth 3 (three) times daily.   atorvastatin (LIPITOR) 10 MG tablet TAKE 1 TABLET BY MOUTH EVERY DAY   gabapentin (NEURONTIN) 300 MG capsule TAKE 1 CAPSULE BY MOUTH THREE TIMES A DAY   hydrochlorothiazide (HYDRODIURIL) 25 MG tablet TAKE 1 TABLET (25 MG TOTAL) BY MOUTH DAILY.   sertraline (ZOLOFT) 50 MG tablet TAKE 1 TABLET BY MOUTH EVERY DAY (Patient not taking: Reported on 03/17/2023)   Zoster Vaccine Adjuvanted [facility]) injection Inject into the muscle. (Patient not taking: Reported on 03/17/2023)   Zoster  Vaccine Adjuvanted [facility]) injection Inject into the muscle. (Patient not taking: Reported on 03/17/2023)   No facility-administered encounter medications on file as of 03/17/2023.    Allergies (verified) Patient has no known allergies.   History: Past Medical History:  Diagnosis Date   Adenomatous polyp of colon 03/2007   Anemia    s/p GI bleed, hospital 5/08   Anxiety    Depression    Diverticulosis    Endometriosis    uterine US 06/2003   GERD (gastroesophageal reflux disease)    Hiatal hernia    EGD 5/08- stricture, HH, gastritis, GERD   History of blood transfusion    Hyperlipidemia    Hypertension    Hypertension    Iron deficiency 03/16/2014   Myelodysplastic disease ([facility])    Past Surgical History:  Procedure Laterality Date   cardiac CT  9/12   normal    CHOLECYSTECTOMY N/A 09/18/2021   Procedure: LAPAROSCOPIC CHOLECYSTECTOMY;  Surgeon: Gaynelle Adu, MD;  Location: [facility] OR;  Service: General;  Laterality: N/A;   HAMMER TOE SURGERY Left 12/02/2017   INTRAOPERATIVE CHOLANGIOGRAM N/A 09/18/2021   Procedure: INTRAOPERATIVE CHOLANGIOGRAM;  Surgeon: Gaynelle Adu, MD;  Location: [facility] OR;  Service: General;  Laterality: N/A;   KNEE SURGERY Left    Cyst behind left knee.   TUBAL LIGATION     Family History  Problem Relation Age of Onset   Cancer Father  unknown type   Hypertension Mother    Colon cancer Neg Hx    Esophageal cancer Neg Hx    Rectal cancer Neg Hx    Stomach cancer Neg Hx    Social History   Socioeconomic History   Marital status: Married    Spouse name: Not on file   Number of children: Not on file   Years of education: Not on file   Highest education level: Not on file  Occupational History   Occupation: [employer]  Tobacco Use   Smoking status: Never   Smokeless tobacco: Never  Vaping Use   Vaping Use: Never used  Substance and Sexual Activity   Alcohol use: No    Alcohol/week: 0.0 standard drinks of alcohol   Drug use:  No   Sexual activity: Yes  Other Topics Concern   Not on file  Social History Narrative   Not on file   Social Determinants of Health   Financial Resource Strain: Low Risk  (03/17/2023)   Overall Financial Resource Strain (CARDIA)    Difficulty of Paying Living Expenses: Not hard at all  Food Insecurity: No Food Insecurity (03/17/2023)   Hunger Vital Sign    Worried About Running Out of Food in the Last Year: Never true    Ran Out of Food in the Last Year: Never true  Transportation Needs: No Transportation Needs (03/17/2023)   PRAPARE - Administrator, Civil Service (Medical): No    Lack of Transportation (Non-Medical): No  Physical Activity: Insufficiently Active (03/17/2023)   Exercise Vital Sign    Days of Exercise per Week: 3 days    Minutes of Exercise per Session: 30 min  Stress: No Stress Concern Present (03/17/2023)   [employer] of Occupational Health - Occupational Stress Questionnaire    Feeling of Stress : Not at all  Social Connections: Moderately Integrated (03/17/2023)   Social Connection and Isolation Panel [NHANES]    Frequency of Communication with Friends and Family: More than three times a week    Frequency of Social Gatherings with Friends and Family: More than three times a week    Attends Religious Services: More than 4 times per year    Active Member of [employer] or Organizations: No    Attends Engineer, structural: Never    Marital Status: Married    Tobacco Counseling Counseling given: Not Answered   Clinical Intake:  Pre-visit preparation completed: Yes  Pain : No/denies pain Pain Score: 0-No pain     Nutritional Risks: None Diabetes: No  How often do you need to have someone help you when you read instructions, pamphlets, or other written materials from your doctor or pharmacy?: 1 - Never  Diabetic? no  Interpreter Needed?: No  Information entered by :: C.Keola Heninger LPN   Activities of Daily Living    03/17/2023    10:39 AM  In your present state of health, do you have any difficulty performing the following activities:  Hearing? 0  Vision? 0  Difficulty concentrating or making decisions? 0  Walking or climbing stairs? 0  Dressing or bathing? 0  Doing errands, shopping? 0  Preparing Food and eating ? N  Using the Toilet? N  In the past six months, have you accidently leaked urine? N  Do you have problems with loss of bowel control? N  Managing your Medications? N  Managing your Finances? N  Housekeeping or managing your Housekeeping? N    Patient Care Team:  Tower, Audrie Gallus, MD as PCP - General Wendall Stade, MD as PCP - Cardiology (Cardiology)  Indicate any recent Medical Services you may have received from other than [facility] providers in the past year (date may be approximate).     Assessment:   This is a routine wellness examination for Kathryn Weaver.  Hearing/Vision screen Hearing Screening - Comments:: No aids Vision Screening - Comments:: Glasses when driving - Dr.Koop  Dietary issues and exercise activities discussed: Current Exercise Habits: Home exercise routine, Type of exercise: walking, Time (Minutes): 30, Frequency (Times/Week): 3, Weekly Exercise (Minutes/Week): 90, Intensity: Mild, Exercise limited by: None identified   Goals Addressed             This Visit's Progress    Patient Stated       Drink more water and exercise more.       Depression Screen    03/17/2023   10:37 AM 05/08/2022    8:04 AM 03/13/2022   10:50 AM 12/12/2021    9:08 AM 10/25/2020    9:06 AM 11/30/2019    8:39 AM 08/24/2019   11:32 AM  PHQ 2/9 Scores  PHQ - 2 Score 0 0 0 0 0 0 0  PHQ- 9 Score    6 0  0    Fall Risk    03/17/2023   10:39 AM 05/08/2022    8:04 AM 03/13/2022   10:52 AM 12/12/2021    9:10 AM 10/25/2020    9:05 AM  Fall Risk   Falls in the past year? 0  0 0 0  Number falls in past yr: 0  0 0 0  Injury with Fall? 0  0 0 0  Risk for fall due to : No Fall Risks  No Fall Risks   Medication side effect  Follow up Falls prevention discussed;Falls evaluation completed;Education provided Falls evaluation completed Falls prevention discussed  Falls evaluation completed;Falls prevention discussed    FALL RISK PREVENTION PERTAINING TO THE HOME:  Any stairs in or around the home? No  If so, are there any without handrails? No  Home free of loose throw rugs in walkways, pet beds, electrical cords, etc? Yes  Adequate lighting in your home to reduce risk of falls? Yes   ASSISTIVE DEVICES UTILIZED TO PREVENT FALLS:  Life alert? No  Use of a cane, walker or w/c? No  Grab bars in the bathroom? Yes  Shower chair or bench in shower? No  Elevated toilet seat or a handicapped toilet? No    Cognitive Function:    10/25/2020    9:12 AM 08/24/2019   11:35 AM 01/28/2018   11:58 AM 11/26/2016    9:31 AM  MMSE - Mini Mental State Exam  Orientation to time 5 5 5 5   Orientation to Place 5 5 5 5   Registration 3 3 3 3   Attention/ Calculation 5 5 0 0  Recall 3 3 3 3   Language- name 2 objects   0 0  Language- repeat 1 1 1 1   Language- follow 3 step command   3 3  Language- read & follow direction   0 0  Write a sentence   0 0  Copy design   0 0  Total score   20 20        03/17/2023   10:40 AM 03/13/2022   10:55 AM  6CIT Screen  What Year? 0 points 0 points  What month? 0 points 0 points  What time? 0 points  0 points  Count back from 20 0 points 0 points  Months in reverse 0 points 4 points  Repeat phrase 0 points 0 points  Total Score 0 points 4 points    Immunizations Immunization History  Administered Date(s) Administered   Fluad Quad(high Dose 65+) 08/31/2019   Influenza,inj,Quad PF,6+ Mos 10/18/2015, 08/28/2016, 02/18/2018   Influenza-Unspecified 09/11/2018   PFIZER Comirnaty(Gray Top)Covid-19 Tri-Sucrose Vaccine 04/30/2021   PFIZER(Purple Top)SARS-COV-2 Vaccination 12/15/2019, 01/07/2020, 08/26/2020   Pfizer Covid-19 Vaccine Bivalent Booster 9yrs & up  09/05/2021   Pneumococcal Conjugate-13 10/18/2015   Pneumococcal Polysaccharide-23 03/01/2014   Td 03/26/1996, 08/09/2008   Zoster Recombinat (Shingrix) 05/22/2022, 11/25/2022    TDAP status: Due, Education has been provided regarding the importance of this vaccine. Advised may receive this vaccine at local pharmacy or Health Dept. Aware to provide a copy of the vaccination record if obtained from local pharmacy or Health Dept. Verbalized acceptance and understanding.  Flu Vaccine status: Up to date  Pneumococcal vaccine status: Up to date  Covid-19 vaccine status: Information provided on how to obtain vaccines.   Qualifies for Shingles Vaccine? Yes   Zostavax completed  unknown   Shingrix Completed?: Yes  Screening Tests Health Maintenance  Topic Date Due   DTaP/Tdap/Td (3 - Tdap) 08/09/2018   COVID-19 Vaccine (6 - 2023-24 season) 07/12/2022   MAMMOGRAM  03/01/2023   INFLUENZA VACCINE  06/12/2023   COLONOSCOPY (Pts 45-25yrs Insurance coverage will need to be confirmed)  08/19/2023   Medicare Annual Wellness (AWV)  03/16/2024   Pneumonia Vaccine 51+ Years old  Completed   DEXA SCAN  Completed   Hepatitis C Screening  Completed   Zoster Vaccines- Shingrix  Completed   HPV VACCINES  Aged Out    Health Maintenance  Health Maintenance Due  Topic Date Due   DTaP/Tdap/Td (3 - Tdap) 08/09/2018   COVID-19 Vaccine (6 - 2023-24 season) 07/12/2022   MAMMOGRAM  03/01/2023    Colorectal cancer screening: Type of screening: Colonoscopy. Completed 08/18/2018. Repeat every 5 years Pt DECLINED  Mammogram status: Completed 02/28/2022. Repeat every year pt calling for appointment.  Bone Density status: Completed 11/18/2022. Results reflect: Bone density results: OSTEOPENIA. Repeat every 11/18/2022 years.  Lung Cancer Screening: (Low Dose CT Chest recommended if Age 66-80 years, 30 pack-year currently smoking OR have quit w/in 15years.) does not qualify.   Lung Cancer Screening  Referral: no  Additional Screening:  Hepatitis C Screening: does qualify; Completed 01/10/2014  Vision Screening: Recommended annual ophthalmology exams for early detection of glaucoma and other disorders of the eye. Is the patient up to date with their annual eye exam?  Yes  Who is the provider or what is the name of the office in which the patient attends annual eye exams? Dr.Koop If pt is not established with a provider, would they like to be referred to a provider to establish care? No .   Dental Screening: Recommended annual dental exams for proper oral hygiene  Community Resource Referral / Chronic Care Management: CRR required this visit?  No   CCM required this visit?  No      Plan:     I have personally reviewed and noted the following in the patient's chart:   Medical and social history Use of alcohol, tobacco or illicit drugs  Current medications and supplements including opioid prescriptions. Patient is not currently taking opioid prescriptions. Functional ability and status Nutritional status Physical activity Advanced directives List of other physicians Hospitalizations, surgeries, and ER visits in  previous 12 months Vitals Screenings to include cognitive, depression, and falls Referrals and appointments  In addition, I have reviewed and discussed with patient certain preventive protocols, quality metrics, and best practice recommendations. A written personalized care plan for preventive services as well as general preventive health recommendations were provided to patient.     Maryan Puls, LPN   [id number]/8118   Nurse Notes: none

## 2023-03-17 NOTE — Patient Instructions (Signed)
Kathryn Weaver , Thank you for taking time to come for your Medicare Wellness Visit. I appreciate your ongoing commitment to your health goals. Please review the following plan we discussed and let me know if I can assist you in the future.   These are the goals we discussed:  Goals      DIET - EAT MORE FRUITS AND VEGETABLES     Starting 01/28/2018, I will continue to eat 4-5 servings of fruits and vegetables.     Increase physical activity     Starting 1/11/16/16, I will continue to walk at least 30 min 1-2 days per week.      Patient Stated     08/24/2019, I will maintain my weight where it is.      Patient Stated     10/25/2020, I will maintain and continue medications as prescribed.      Patient Stated     Drink more water and exercise more.        This is a list of the screening recommended for you and due dates:  Health Maintenance  Topic Date Due   DTaP/Tdap/Td vaccine (3 - Tdap) 08/09/2018   COVID-19 Vaccine (6 - 2023-24 season) 07/12/2022   Mammogram  03/01/2023   Flu Shot  06/12/2023   Colon Cancer Screening  08/19/2023   Medicare Annual Wellness Visit  03/16/2024   Pneumonia Vaccine  Completed   DEXA scan (bone density measurement)  Completed   Hepatitis C Screening: USPSTF Recommendation to screen - Ages 30-79 yo.  Completed   Zoster (Shingles) Vaccine  Completed   HPV Vaccine  Aged Out    Advanced directives: none  Conditions/risks identified: Aim for 30 minutes of exercise or brisk walking, 6-8 glasses of water, and 5 servings of fruits and vegetables each day.   Next appointment: Follow up in one year for your annual wellness visit 03/17/2024 Televisit   Preventive Care 65 Years and Older, Female Preventive care refers to lifestyle choices and visits with your health care provider that can promote health and wellness. What does preventive care include? A yearly physical exam. This is also called an annual well check. Dental exams once or twice a  year. Routine eye exams. Ask your health care provider how often you should have your eyes checked. Personal lifestyle choices, including: Daily care of your teeth and gums. Regular physical activity. Eating a healthy diet. Avoiding tobacco and drug use. Limiting alcohol use. Practicing safe sex. Taking low-dose aspirin every day. Taking vitamin and mineral supplements as recommended by your health care provider. What happens during an annual well check? The services and screenings done by your health care provider during your annual well check will depend on your age, overall health, lifestyle risk factors, and family history of disease. Counseling  Your health care provider may ask you questions about your: Alcohol use. Tobacco use. Drug use. Emotional well-being. Home and relationship well-being. Sexual activity. Eating habits. History of falls. Memory and ability to understand (cognition). Work and work Astronomer. Reproductive health. Screening  You may have the following tests or measurements: Height, weight, and BMI. Blood pressure. Lipid and cholesterol levels. These may be checked every 5 years, or more frequently if you are over 13 years old. Skin check. Lung cancer screening. You may have this screening every year starting at age 54 if you have a 30-pack-year history of smoking and currently smoke or have quit within the past 15 years. Fecal occult blood test (FOBT) of the  stool. You may have this test every year starting at age 67. Flexible sigmoidoscopy or colonoscopy. You may have a sigmoidoscopy every 5 years or a colonoscopy every 10 years starting at age 58. Hepatitis C blood test. Hepatitis B blood test. Sexually transmitted disease (STD) testing. Diabetes screening. This is done by checking your blood sugar (glucose) after you have not eaten for a while (fasting). You may have this done every 1-3 years. Bone density scan. This is done to screen for  osteoporosis. You may have this done starting at age 72. Mammogram. This may be done every 1-2 years. Talk to your health care provider about how often you should have regular mammograms. Talk with your health care provider about your test results, treatment options, and if necessary, the need for more tests. Vaccines  Your health care provider may recommend certain vaccines, such as: Influenza vaccine. This is recommended every year. Tetanus, diphtheria, and acellular pertussis (Tdap, Td) vaccine. You may need a Td booster every 10 years. Zoster vaccine. You may need this after age 42. Pneumococcal 13-valent conjugate (PCV13) vaccine. One dose is recommended after age 57. Pneumococcal polysaccharide (PPSV23) vaccine. One dose is recommended after age 46. Talk to your health care provider about which screenings and vaccines you need and how often you need them. This information is not intended to replace advice given to you by your health care provider. Make sure you discuss any questions you have with your health care provider. Document Released: 11/24/2015 Document Revised: 07/17/2016 Document Reviewed: 08/29/2015 Elsevier Interactive Patient Education  2017 [employer] Prevention in the Home Falls can cause injuries. They can happen to people of all ages. There are many things you can do to make your home safe and to help prevent falls. What can I do on the outside of my home? Regularly fix the edges of walkways and driveways and fix any cracks. Remove anything that might make you trip as you walk through a door, such as a raised step or threshold. Trim any bushes or trees on the path to your home. Use bright outdoor lighting. Clear any walking paths of anything that might make someone trip, such as rocks or tools. Regularly check to see if handrails are loose or broken. Make sure that both sides of any steps have handrails. Any raised decks and porches should have guardrails on  the edges. Have any leaves, snow, or ice cleared regularly. Use sand or salt on walking paths during winter. Clean up any spills in your garage right away. This includes oil or grease spills. What can I do in the bathroom? Use night lights. Install grab bars by the toilet and in the tub and shower. Do not use towel bars as grab bars. Use non-skid mats or decals in the tub or shower. If you need to sit down in the shower, use a plastic, non-slip stool. Keep the floor dry. Clean up any water that spills on the floor as soon as it happens. Remove soap buildup in the tub or shower regularly. Attach bath mats securely with double-sided non-slip rug tape. Do not have throw rugs and other things on the floor that can make you trip. What can I do in the bedroom? Use night lights. Make sure that you have a light by your bed that is easy to reach. Do not use any sheets or blankets that are too big for your bed. They should not hang down onto the floor. Have a firm chair that  has side arms. You can use this for support while you get dressed. Do not have throw rugs and other things on the floor that can make you trip. What can I do in the kitchen? Clean up any spills right away. Avoid walking on wet floors. Keep items that you use a lot in easy-to-reach places. If you need to reach something above you, use a strong step stool that has a grab bar. Keep electrical cords out of the way. Do not use floor polish or wax that makes floors slippery. If you must use wax, use non-skid floor wax. Do not have throw rugs and other things on the floor that can make you trip. What can I do with my stairs? Do not leave any items on the stairs. Make sure that there are handrails on both sides of the stairs and use them. Fix handrails that are broken or loose. Make sure that handrails are as long as the stairways. Check any carpeting to make sure that it is firmly attached to the stairs. Fix any carpet that is loose  or worn. Avoid having throw rugs at the top or bottom of the stairs. If you do have throw rugs, attach them to the floor with carpet tape. Make sure that you have a light switch at the top of the stairs and the bottom of the stairs. If you do not have them, ask someone to add them for you. What else can I do to help prevent falls? Wear shoes that: Do not have high heels. Have rubber bottoms. Are comfortable and fit you well. Are closed at the toe. Do not wear sandals. If you use a stepladder: Make sure that it is fully opened. Do not climb a closed stepladder. Make sure that both sides of the stepladder are locked into place. Ask someone to hold it for you, if possible. Clearly mark and make sure that you can see: Any grab bars or handrails. First and last steps. Where the edge of each step is. Use tools that help you move around (mobility aids) if they are needed. These include: Canes. Walkers. Scooters. Crutches. Turn on the lights when you go into a dark area. Replace any light bulbs as soon as they burn out. Set up your furniture so you have a clear path. Avoid moving your furniture around. If any of your floors are uneven, fix them. If there are any pets around you, be aware of where they are. Review your medicines with your doctor. Some medicines can make you feel dizzy. This can increase your chance of falling. Ask your doctor what other things that you can do to help prevent falls. This information is not intended to replace advice given to you by your health care provider. Make sure you discuss any questions you have with your health care provider. Document Released: 08/24/2009 Document Revised: 04/04/2016 Document Reviewed: 12/02/2014 Elsevier Interactive Patient Education  2017 [employer].

## 2023-04-04 ENCOUNTER — Ambulatory Visit: Payer: Medicare PPO | Admitting: Family Medicine

## 2023-04-04 ENCOUNTER — Encounter: Payer: Self-pay | Admitting: Family Medicine

## 2023-04-04 VITALS — BP 128/66 | HR 55 | Temp 97.8°F | Ht 63.0 in | Wt 174.0 lb

## 2023-04-04 DIAGNOSIS — D708 Other neutropenia: Secondary | ICD-10-CM | POA: Diagnosis not present

## 2023-04-04 DIAGNOSIS — F418 Other specified anxiety disorders: Secondary | ICD-10-CM

## 2023-04-04 DIAGNOSIS — R5382 Chronic fatigue, unspecified: Secondary | ICD-10-CM

## 2023-04-04 DIAGNOSIS — E210 Primary hyperparathyroidism: Secondary | ICD-10-CM | POA: Diagnosis not present

## 2023-04-04 DIAGNOSIS — D696 Thrombocytopenia, unspecified: Secondary | ICD-10-CM | POA: Diagnosis not present

## 2023-04-04 DIAGNOSIS — D469 Myelodysplastic syndrome, unspecified: Secondary | ICD-10-CM

## 2023-04-04 DIAGNOSIS — I10 Essential (primary) hypertension: Secondary | ICD-10-CM

## 2023-04-04 DIAGNOSIS — R5383 Other fatigue: Secondary | ICD-10-CM | POA: Insufficient documentation

## 2023-04-04 DIAGNOSIS — R7303 Prediabetes: Secondary | ICD-10-CM | POA: Diagnosis not present

## 2023-04-04 LAB — COMPREHENSIVE METABOLIC PANEL
ALT: 16 U/L (ref 0–35)
AST: 19 U/L (ref 0–37)
Albumin: 3.6 g/dL (ref 3.5–5.2)
Alkaline Phosphatase: 70 U/L (ref 39–117)
BUN: 17 mg/dL (ref 6–23)
CO2: 31 mEq/L (ref 19–32)
Calcium: 10.5 mg/dL (ref 8.4–10.5)
Chloride: 104 mEq/L (ref 96–112)
Creatinine, Ser: 0.78 mg/dL (ref 0.40–1.20)
GFR: 74.06 mL/min (ref >60.00)
Glucose, Bld: 95 mg/dL (ref 70–99)
Potassium: 3.9 mEq/L (ref 3.5–5.1)
Sodium: 142 mEq/L (ref 135–145)
Total Bilirubin: 0.5 mg/dL (ref 0.2–1.2)
Total Protein: 7.1 g/dL (ref 6.0–8.3)

## 2023-04-04 LAB — CBC WITH DIFFERENTIAL/PLATELET
Basophils Absolute: 0 10*3/uL (ref 0.0–0.1)
Basophils Relative: 0.2 % (ref 0.0–3.0)
Eosinophils Absolute: 0.1 10*3/uL (ref 0.0–0.7)
Eosinophils Relative: 2.1 % (ref 0.0–5.0)
HCT: 39.4 % (ref 36.0–46.0)
Hemoglobin: 12.7 g/dL (ref 12.0–15.0)
Lymphocytes Relative: 46.6 % — ABNORMAL HIGH (ref 12.0–46.0)
Lymphs Abs: 1.5 10*3/uL (ref 0.7–4.0)
MCHC: 32.3 g/dL (ref 30.0–36.0)
MCV: 97.3 fl (ref 78.0–100.0)
Monocytes Absolute: 0.5 10*3/uL (ref 0.1–1.0)
Monocytes Relative: 17.1 % — ABNORMAL HIGH (ref 3.0–12.0)
Neutro Abs: 1.1 10*3/uL — ABNORMAL LOW (ref 1.4–7.7)
Neutrophils Relative %: 34 % — ABNORMAL LOW (ref 43.0–77.0)
Platelets: 100 10*3/uL — ABNORMAL LOW (ref 150.0–400.0)
RBC: 4.05 Mil/uL (ref 3.87–5.11)
RDW: 13.4 % (ref 11.5–15.5)
WBC: 3.2 10*3/uL — ABNORMAL LOW (ref 4.0–10.5)

## 2023-04-04 LAB — VITAMIN D 25 HYDROXY (VIT D DEFICIENCY, FRACTURES): VITD: 33.59 ng/mL (ref 30.00–100.00)

## 2023-04-04 LAB — IRON: Iron: 104 ug/dL (ref 42–145)

## 2023-04-04 LAB — T4, FREE: Free T4: 0.69 ng/dL (ref 0.60–1.60)

## 2023-04-04 LAB — TSH: TSH: 2.73 u[IU]/mL (ref 0.35–5.50)

## 2023-04-04 LAB — HEMOGLOBIN A1C: Hgb A1c MFr Bld: 6.1 % (ref 4.6–6.5)

## 2023-04-04 LAB — VITAMIN B12: Vitamin B-12: 785 pg/mL (ref 211–911)

## 2023-04-04 NOTE — Assessment & Plan Note (Signed)
Mood is stable Pt wants to stay on sertraline 50 mg which has been helpful  Despite 6 mo of fatigue/ mood is good

## 2023-04-04 NOTE — Assessment & Plan Note (Signed)
A1c today disc imp of low glycemic diet and wt loss to prevent DM2  More tired lately

## 2023-04-04 NOTE — Assessment & Plan Note (Signed)
Overdue for f/u at [facility]

## 2023-04-04 NOTE — Progress Notes (Signed)
Subjective:    Patient ID: Kathryn Weaver, female    DOB: 15-Oct-1946, 77 y.o.   MRN: [id number]  HPI Pt presents with c/o fatigue  Wt Readings from Last 3 Encounters:  04/04/23 174 lb (78.9 kg)  03/17/23 170 lb (77.1 kg)  06/17/22 168 lb 9.6 oz (76.5 kg)   30.82 kg/m  Vitals:   04/04/23 0801  BP: 128/66  Pulse: (!) 55  Temp: 97.8 F (36.6 C)  SpO2: 97%   Feels very tired  Just does not feel well   No pain  No ST, fever or uri symptoms   Tried to walk for exercise - got too tired (no sob or chest pain)    Pt has h/o myelodysplastic disorder and hyperparathyroidism  Also depressoin with anxiety  Takes sertalin 50 mg daily   Prediabetes Lab Results  Component Value Date   HGBA1C 6.1 05/08/2022   Is eating well  Veggies Protein- egg/ meat  Does not eat a lot   Does have cream cicle at night     Lab Results  Component Value Date   WBC 3.1 (L) 05/08/2022   HGB 12.8 05/08/2022   HCT 38.6 05/08/2022   MCV 97.6 05/08/2022   PLT 97.0 (L) 05/08/2022   Lab Results  Component Value Date   WBC 3.1 (L) 05/08/2022   HGB 12.8 05/08/2022   HCT 38.6 05/08/2022   MCV 97.6 05/08/2022   PLT 97.0 (L) 05/08/2022   Lab Results  Component Value Date   TSH 3.56 05/08/2022   Lab Results  Component Value Date   CALCIUM 10.6 (H) 06/10/2022    Patient Active Problem List   Diagnosis Date Noted   Fatigue 04/04/2023   Osteopenia 11/20/2022   Primary hyperparathyroidism (HCC) 06/17/2022   ETD (Eustachian tube dysfunction), right 06/17/2022   Hypercalcemia 05/10/2022   Other postherpetic nervous system involvement 12/12/2021   Post herpetic neuralgia 12/12/2021   Cholelithiasis 06/04/2021   Diminished pulses in lower extremity 05/24/2020   Myelodysplastic syndrome, unspecified (HCC) 08/31/2019   Estrogen deficiency 08/31/2019   Diverticulitis 04/27/2018   Constipation 03/18/2018   MGUS (monoclonal gammopathy of unknown significance) 09/17/2017   Prediabetes  10/21/2016   Somnolence, daytime 04/11/2016   Internal hemorrhoid 04/20/2014   Screening mammogram, encounter for 03/01/2014   Uterine prolapse 03/01/2014   Hyperlipidemia 04/28/2012   Routine general medical examination at a health care facility 04/20/2012   Obesity 02/05/2012   Other neutropenia (HCC) 10/23/2010   Thrombocytopenia (HCC) 10/24/2009   Elevated transaminase level 10/24/2009   POSTMENOPAUSAL STATUS 08/09/2008   Depression with anxiety 08/05/2007   Essential hypertension 08/05/2007   GERD 04/20/2007   DIVERTICULAR DISEASE 04/20/2007   History of colonic polyps 04/13/2007   Past Medical History:  Diagnosis Date   Adenomatous polyp of colon 03/2007   Anemia    s/p GI bleed, hospital 5/08   Anxiety    Depression    Diverticulosis    Endometriosis    uterine US 06/2003   GERD (gastroesophageal reflux disease)    Hiatal hernia    EGD 5/08- stricture, HH, gastritis, GERD   History of blood transfusion    Hyperlipidemia    Hypertension    Hypertension    Iron deficiency 03/16/2014   Myelodysplastic disease (HCC)    Past Surgical History:  Procedure Laterality Date   cardiac CT  9/12   normal    CHOLECYSTECTOMY N/A 09/18/2021   Procedure: LAPAROSCOPIC CHOLECYSTECTOMY;  Surgeon: Gaynelle Adu, MD;  Location:  [facility] OR;  Service: General;  Laterality: N/A;   HAMMER TOE SURGERY Left 12/02/2017   INTRAOPERATIVE CHOLANGIOGRAM N/A 09/18/2021   Procedure: INTRAOPERATIVE CHOLANGIOGRAM;  Surgeon: Gaynelle Adu, MD;  Location: [facility] OR;  Service: General;  Laterality: N/A;   KNEE SURGERY Left    Cyst behind left knee.   TUBAL LIGATION     Social History   Tobacco Use   Smoking status: Never   Smokeless tobacco: Never  Vaping Use   Vaping Use: Never used  Substance Use Topics   Alcohol use: No    Alcohol/week: 0.0 standard drinks of alcohol   Drug use: No   Family History  Problem Relation Age of Onset   Cancer Father        unknown type   Hypertension Mother     Colon cancer Neg Hx    Esophageal cancer Neg Hx    Rectal cancer Neg Hx    Stomach cancer Neg Hx    No Known Allergies Current Outpatient Medications on File Prior to Visit  Medication Sig Dispense Refill   atorvastatin (LIPITOR) 10 MG tablet TAKE 1 TABLET BY MOUTH EVERY DAY 90 tablet 1   gabapentin (NEURONTIN) 300 MG capsule TAKE 1 CAPSULE BY MOUTH THREE TIMES A DAY 90 capsule 3   hydrochlorothiazide (HYDRODIURIL) 25 MG tablet TAKE 1 TABLET (25 MG TOTAL) BY MOUTH DAILY. 90 tablet 1   sertraline (ZOLOFT) 50 MG tablet TAKE 1 TABLET BY MOUTH EVERY DAY 90 tablet 3   No current facility-administered medications on file prior to visit.     Review of Systems  Constitutional:  Positive for fatigue. Negative for activity change, appetite change, fever and unexpected weight change.  HENT:  Negative for congestion, ear pain, rhinorrhea, sinus pressure and sore throat.   Eyes:  Negative for pain, redness and visual disturbance.  Respiratory:  Negative for cough, shortness of breath and wheezing.   Cardiovascular:  Negative for chest pain and palpitations.  Gastrointestinal:  Negative for abdominal pain, blood in stool, constipation and diarrhea.  Endocrine: Negative for polydipsia and polyuria.  Genitourinary:  Negative for dysuria, frequency and urgency.  Musculoskeletal:  Negative for arthralgias, back pain and myalgias.  Skin:  Negative for pallor and rash.  Allergic/Immunologic: Negative for environmental allergies.  Neurological:  Negative for dizziness, syncope and headaches.  Hematological:  Negative for adenopathy. Does not bruise/bleed easily.  Psychiatric/Behavioral:  Negative for decreased concentration, dysphoric mood and sleep disturbance. The patient is not nervous/anxious.        Objective:   Physical Exam Constitutional:      General: She is not in acute distress.    Appearance: Normal appearance. She is well-developed. She is obese. She is not ill-appearing or diaphoretic.   HENT:     Head: Normocephalic and atraumatic.     Right Ear: Tympanic membrane and ear canal normal.     Left Ear: Tympanic membrane and ear canal normal.     Mouth/Throat:     Mouth: Mucous membranes are moist.     Pharynx: Oropharynx is clear.  Eyes:     General: No scleral icterus.       Right eye: No discharge.        Left eye: No discharge.     Conjunctiva/sclera: Conjunctivae normal.     Pupils: Pupils are equal, round, and reactive to light.  Neck:     Thyroid: No thyromegaly.     Vascular: No carotid bruit or JVD.  Cardiovascular:  Rate and Rhythm: Regular rhythm. Bradycardia present.     Heart sounds: Normal heart sounds.     No gallop.  Pulmonary:     Effort: Pulmonary effort is normal. No respiratory distress.     Breath sounds: Normal breath sounds. No stridor. No wheezing, rhonchi or rales.  Abdominal:     General: There is no distension or abdominal bruit.     Palpations: Abdomen is soft. There is no mass.     Tenderness: There is no abdominal tenderness. There is no right CVA tenderness, left CVA tenderness or guarding.  Musculoskeletal:     Cervical back: Normal range of motion and neck supple.     Right lower leg: No edema.     Left lower leg: No edema.  Lymphadenopathy:     Cervical: No cervical adenopathy.  Skin:    General: Skin is warm and dry.     Coloration: Skin is not pale.     Findings: No bruising or rash.  Neurological:     Mental Status: She is alert.     Coordination: Coordination normal.     Deep Tendon Reflexes: Reflexes are normal and symmetric. Reflexes normal.  Psychiatric:        Mood and Affect: Mood normal.           Assessment & Plan:   Problem List Items Addressed This Visit       Cardiovascular and Mediastinum   Essential hypertension    bp in fair control at this time  BP Readings from Last 1 Encounters:  04/04/23 128/66  No changes needed Most recent labs reviewed  Disc lifstyle change with low sodium diet  and exercise  Plan to continue hctz 25 mg daily  Lab ordered       Relevant Orders   CBC with Differential/Platelet   Comprehensive metabolic panel     Endocrine   Primary hyperparathyroidism (HCC)   Relevant Orders   VITAMIN D 25 Hydroxy (Vit-D Deficiency, Fractures)   Comprehensive metabolic panel   PTH, Intact and Calcium     Hematopoietic and Hemostatic   Thrombocytopenia (HCC)    Overdue for f/u at [facility]      Relevant Orders   CBC with Differential/Platelet   Vitamin B12   Ambulatory referral to Hematology / Oncology     Other   Depression with anxiety    Mood is stable Pt wants to stay on sertraline 50 mg which has been helpful  Despite 6 mo of fatigue/ mood is good      Fatigue - Primary    Suspect multifactorial  Denies depressed mood  Has good sleep  Has myelodysplastic syndrome and hyperparathyroidism -past due for eval today  Lab today  Ref to heme/onc for f/u      Relevant Orders   T4, free   Vitamin B12   TSH   Myelodysplastic syndrome, unspecified (HCC)    Overdue for f/u of this  H/o low wbc and also low platelets More tired over past 6 mo  Missed last appt/unsure how   Referral done       Relevant Orders   Iron   CBC with Differential/Platelet   Vitamin B12   Ambulatory referral to Hematology / Oncology   Other neutropenia (HCC)    Followed at [facility] Myelodysplastic syndrome Missed last appt in aug Ref done for f/u Lab today      Prediabetes    A1c today disc imp of low  glycemic diet and wt loss to prevent DM2  More tired lately      Relevant Orders   Hemoglobin A1c

## 2023-04-04 NOTE — Assessment & Plan Note (Signed)
Suspect multifactorial  Denies depressed mood  Has good sleep  Has myelodysplastic syndrome and hyperparathyroidism -past due for eval today  Lab today  Ref to heme/onc for f/u

## 2023-04-04 NOTE — Assessment & Plan Note (Signed)
bp in fair control at this time  BP Readings from Last 1 Encounters:  04/04/23 128/66   No changes needed Most recent labs reviewed  Disc lifstyle change with low sodium diet and exercise  Plan to continue hctz 25 mg daily  Lab ordered

## 2023-04-04 NOTE — Assessment & Plan Note (Signed)
Overdue for f/u of this  H/o low wbc and also low platelets More tired over past 6 mo  Missed last appt/unsure how   Referral done

## 2023-04-04 NOTE — Assessment & Plan Note (Signed)
Followed at [facility] Myelodysplastic syndrome Missed last appt in aug Ref done for f/u Lab today

## 2023-04-04 NOTE — Patient Instructions (Addendum)
Labs today   I will work on a referral back to hematology to follow up  I put the referral in  Please let us know if you don't hear in 1-2 weeks    Eat a healthy diet  Make sure you drink enough fluids

## 2023-04-05 LAB — PTH, INTACT AND CALCIUM
Calcium: 10.5 mg/dL — ABNORMAL HIGH (ref 8.6–10.4)
PTH: 54 pg/mL (ref 16–77)

## 2023-04-09 ENCOUNTER — Encounter: Payer: Self-pay | Admitting: Family Medicine

## 2023-04-09 NOTE — Telephone Encounter (Signed)
error 

## 2023-04-11 ENCOUNTER — Other Ambulatory Visit: Payer: Self-pay | Admitting: Family Medicine

## 2023-04-11 DIAGNOSIS — Z1231 Encounter for screening mammogram for malignant neoplasm of breast: Secondary | ICD-10-CM

## 2023-04-13 ENCOUNTER — Other Ambulatory Visit: Payer: Self-pay | Admitting: Family Medicine

## 2023-04-14 ENCOUNTER — Encounter: Payer: Self-pay | Admitting: Family Medicine

## 2023-04-14 ENCOUNTER — Telehealth: Payer: Self-pay | Admitting: Family Medicine

## 2023-04-14 NOTE — Telephone Encounter (Signed)
See result note for further documentation.

## 2023-04-14 NOTE — Telephone Encounter (Signed)
Pt called back returning [facility] call regarding results. Told pt Dr. Royden Purl response. Pt had no questions/concerns. Pt states her next appt with hematologist is next month, July. Pt states she wasn't instructed to f/u during last visit with Dr. Horald Pollen. Pt states if her thyroid, iron & b12 are normal, then what would explain her tiredness? Pt states she will wait until her appt with her hematologist to see why. Call back # [phone]

## 2023-04-15 ENCOUNTER — Telehealth: Payer: Self-pay | Admitting: Family Medicine

## 2023-04-15 NOTE — Telephone Encounter (Signed)
Patient came by and dropped off paperwork to be signed by Dr. Milinda Antis. Placed in [employer] Thank you!

## 2023-04-16 NOTE — Telephone Encounter (Signed)
In your inbox.

## 2023-04-17 ENCOUNTER — Other Ambulatory Visit: Payer: Self-pay | Admitting: Family Medicine

## 2023-04-17 NOTE — Progress Notes (Signed)
[facility] [address], [place] [address]   Clinic Day:  04/18/2023  Referring physician: Tower, Audrie Gallus, MD  Patient Care Team: Tower, Audrie Gallus, MD as PCP - General Wendall Stade, MD as PCP - Cardiology (Cardiology) Dorisann Frames, MD as Referring Physician (Endocrinology) Doreatha Massed, MD as Medical Oncologist (Medical Oncology)   ASSESSMENT & PLAN:   Assessment:  1.  Mild leukopenia and thrombocytopenia: - Patient seen at the request of Dr. Milinda Antis - She has intermittent mild thrombocytopenia since 2010.  Intermittent mild leukopenia since 2009. - No recurrent infections.  No tingling or numbness in extremities.  No headaches or vision changes.  No B symptoms. - She reports worsening tiredness in the last 6 months.  2.  IgM lambda monoclonal gammopathy: - She was previously found to have 1.7 g of IgM lambda monoclonal protein. - BMBX (05/26/2017): Hypercellular bone marrow for age with trilineage hematopoiesis.  3% monoclonal plasma cells were seen.  Lymphocytic component is slightly increased in number at 23% and mostly consisting of small lymphoid cells.  Flow cytometry shows predominance of T lymphocytes with no evident phenotype.  3.  Social/family history: - Lives at home with her husband and is independent of ADLs and IADLs.  She keeps her granddaughters after school.  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  Non-smoker.  No chemical exposure. - No family history of malignancies.  Plan:  1.  Mild leukopenia and thrombocytopenia: - Will repeat CBC and do workup for nutritional deficiencies send bone marrow infiltrative process. - RTC 2 to 3 weeks for follow-up.  2.  IgM lambda monoclonal gammopathy: - This is likely plasma cell neoplasm versus lymphoplasmacytic lymphoma. - Will check SPEP, immunofixation, free light chains, serum viscosity, LDH and beta-2 microglobulin. - Will do flow cytometry.  Will also order skeletal  survey.   Orders Placed This Encounter  Procedures   DG Bone Survey Met    Standing Status:   Future    Standing Expiration Date:   04/17/2024    Order Specific Question:   Reason for Exam (SYMPTOM  OR DIAGNOSIS REQUIRED)    Answer:   MGUS    Order Specific Question:   Preferred imaging location?    Answer:   [facility]    Order Specific Question:   Release to patient    Answer:   Immediate   CBC with Differential    Standing Status:   Future    Number of Occurrences:   1    Standing Expiration Date:   04/17/2024   Ferritin    Standing Status:   Future    Number of Occurrences:   1    Standing Expiration Date:   04/17/2024   Iron and TIBC ([facility] ONLY)    Standing Status:   Future    Number of Occurrences:   1    Standing Expiration Date:   04/17/2024   Vitamin B12    Standing Status:   Future    Number of Occurrences:   1    Standing Expiration Date:   04/17/2024   Folate    Standing Status:   Future    Number of Occurrences:   1    Standing Expiration Date:   04/17/2024   Methylmalonic acid, serum    Standing Status:   Future    Number of Occurrences:   1    Standing Expiration Date:   04/17/2024  Copper, serum    Standing Status:   Future    Number of Occurrences:   1    Standing Expiration Date:   04/17/2024   Lactate dehydrogenase    Standing Status:   Future    Number of Occurrences:   1    Standing Expiration Date:   04/17/2024   Beta 2 microglobuline, serum    Standing Status:   Future    Number of Occurrences:   1    Standing Expiration Date:   04/17/2024   Protein electrophoresis, serum    Standing Status:   Future    Number of Occurrences:   1    Standing Expiration Date:   04/17/2024   Immunofixation electrophoresis    Standing Status:   Future    Number of Occurrences:   1    Standing Expiration Date:   04/17/2024   Kappa/lambda light chains    Standing Status:   Future    Number of Occurrences:   1    Standing Expiration Date:    04/17/2024   Flow Cytometry, Peripheral Blood (Oncology)    Standing Status:   Future    Number of Occurrences:   1    Standing Expiration Date:   04/17/2024   Viscosity, Serum    Standing Status:   Future    Number of Occurrences:   1    Standing Expiration Date:   04/17/2024   Reticulocytes    Standing Status:   Future    Number of Occurrences:   1    Standing Expiration Date:   04/17/2024   Uric acid    Standing Status:   Future    Number of Occurrences:   1    Standing Expiration Date:   04/17/2024   Direct antiglobulin test    Standing Status:   Future    Number of Occurrences:   1    Standing Expiration Date:   04/17/2024      I,Katie Daubenspeck,acting as a scribe for Doreatha Massed, MD.,have documented all relevant documentation on the behalf of Doreatha Massed, MD,as directed by  Doreatha Massed, MD while in the presence of Doreatha Massed, MD.   I, Doreatha Massed MD, have reviewed the above documentation for accuracy and completeness, and I agree with the above.   Doreatha Massed, MD   6/7/20243:40 PM  CHIEF COMPLAINT/PURPOSE OF CONSULT:   Diagnosis: Leukopenia, thrombocytopenia and MGUS  Current Therapy: Under workup  HISTORY OF PRESENT ILLNESS:   Shavy is a 77 y.o. female presenting to clinic today for evaluation of history of MGUS at the request of Dr. Milinda Antis.  She has been followed intermittently since at least 2011 for pancytopenia, first by Dr. Orlie Dakin at [facility] then here at [facility]. Her last visit was with Dr. Melton Alar in 08/2018. She had been scheduled to see me in 06/2022, but she did not show.  She underwent bone marrow biopsy on 05/26/17 showing 3% plasma cells, negative for excess lymphoplasmacytoid cells. SPEP panel revealed an IgM monoclonal protein with lambda light chains.  Her most recent CBC from 04/04/23 showed: WBC 3.2, ANC 1.1, platelets 100k.  Today, she states that she is doing well overall. Her appetite level is at 100%. Her energy  level is at 10%.  PAST MEDICAL HISTORY:   Past Medical History: Past Medical History:  Diagnosis Date   Adenomatous polyp of colon 03/2007   Anemia    s/p GI bleed, hospital 5/08   Anxiety  Depression    Diverticulosis    Endometriosis    uterine US 06/2003   GERD (gastroesophageal reflux disease)    Hiatal hernia    EGD 5/08- stricture, HH, gastritis, GERD   History of blood transfusion    Hyperlipidemia    Hypertension    Hypertension    Iron deficiency 03/16/2014   Myelodysplastic disease [facility])     Surgical History: Past Surgical History:  Procedure Laterality Date   cardiac CT  9/12   normal    CHOLECYSTECTOMY N/A 09/18/2021   Procedure: LAPAROSCOPIC CHOLECYSTECTOMY;  Surgeon: Gaynelle Adu, MD;  Location: [facility] OR;  Service: General;  Laterality: N/A;   HAMMER TOE SURGERY Left 12/02/2017   INTRAOPERATIVE CHOLANGIOGRAM N/A 09/18/2021   Procedure: INTRAOPERATIVE CHOLANGIOGRAM;  Surgeon: Gaynelle Adu, MD;  Location: [facility] OR;  Service: General;  Laterality: N/A;   KNEE SURGERY Left    Cyst behind left knee.   TUBAL LIGATION      Social History: Social History   Socioeconomic History   Marital status: Married    Spouse name: Not on file   Number of children: Not on file   Years of education: Not on file   Highest education level: Not on file  Occupational History   Occupation: [employer]  Tobacco Use   Smoking status: Never   Smokeless tobacco: Never  Vaping Use   Vaping Use: Never used  Substance and Sexual Activity   Alcohol use: No    Alcohol/week: 0.0 standard drinks of alcohol   Drug use: No   Sexual activity: Yes  Other Topics Concern   Not on file  Social History Narrative   Not on file   Social Determinants of Health   Financial Resource Strain: Low Risk  (03/17/2023)   Overall Financial Resource Strain (CARDIA)    Difficulty of Paying Living Expenses: Not hard at all  Food Insecurity: No Food Insecurity (03/17/2023)   Hunger Vital  Sign    Worried About Running Out of Food in the Last Year: Never true    Ran Out of Food in the Last Year: Never true  Transportation Needs: No Transportation Needs (03/17/2023)   PRAPARE - Administrator, Civil Service (Medical): No    Lack of Transportation (Non-Medical): No  Physical Activity: Insufficiently Active (03/17/2023)   Exercise Vital Sign    Days of Exercise per Week: 3 days    Minutes of Exercise per Session: 30 min  Stress: No Stress Concern Present (03/17/2023)   [employer] of Occupational Health - Occupational Stress Questionnaire    Feeling of Stress : Not at all  Social Connections: Moderately Integrated (03/17/2023)   Social Connection and Isolation Panel [NHANES]    Frequency of Communication with Friends and Family: More than three times a week    Frequency of Social Gatherings with Friends and Family: More than three times a week    Attends Religious Services: More than 4 times per year    Active Member of [employer] or Organizations: No    Attends Banker Meetings: Never    Marital Status: Married  Catering manager Violence: Not At Risk (03/17/2023)   Humiliation, Afraid, Rape, and Kick questionnaire    Fear of Current or Ex-Partner: No    Emotionally Abused: No    Physically Abused: No    Sexually Abused: No    Family History: Family History  Problem Relation Age of Onset   Cancer Father  unknown type   Hypertension Mother    Colon cancer Neg Hx    Esophageal cancer Neg Hx    Rectal cancer Neg Hx    Stomach cancer Neg Hx     Current Medications:  Current Outpatient Medications:    atorvastatin (LIPITOR) 10 MG tablet, TAKE 1 TABLET BY MOUTH EVERY DAY, Disp: 90 tablet, Rfl: 1   gabapentin (NEURONTIN) 300 MG capsule, TAKE 1 CAPSULE BY MOUTH THREE TIMES A DAY, Disp: 90 capsule, Rfl: 3   hydrochlorothiazide (HYDRODIURIL) 25 MG tablet, TAKE 1 TABLET (25 MG TOTAL) BY MOUTH DAILY., Disp: 90 tablet, Rfl: 1   sertraline (ZOLOFT)  50 MG tablet, TAKE 1 TABLET BY MOUTH EVERY DAY, Disp: 90 tablet, Rfl: 1   Allergies: No Known Allergies  REVIEW OF SYSTEMS:   Review of Systems  Constitutional:  Positive for fatigue. Negative for chills and fever.  HENT:   Negative for lump/mass, mouth sores, nosebleeds, sore throat and trouble swallowing.   Eyes:  Negative for eye problems.  Respiratory:  Negative for cough and shortness of breath.   Cardiovascular:  Negative for chest pain, leg swelling and palpitations.  Gastrointestinal:  Positive for constipation. Negative for abdominal pain, diarrhea, nausea and vomiting.  Genitourinary:  Negative for bladder incontinence, difficulty urinating, dysuria, frequency, hematuria and nocturia.   Musculoskeletal:  Negative for arthralgias, back pain, flank pain, myalgias and neck pain.  Skin:  Negative for itching and rash.  Neurological:  Negative for dizziness, headaches and numbness.  Hematological:  Does not bruise/bleed easily.  Psychiatric/Behavioral:  Negative for depression, sleep disturbance and suicidal ideas. The patient is not nervous/anxious.   All other systems reviewed and are negative.    VITALS:   Blood pressure 138/72, pulse 63, temperature 98 F (36.7 C), temperature source Oral, resp. rate 16, height 5\' 3"  (1.6 m), weight 173 lb (78.5 kg), SpO2 100 %.  Wt Readings from Last 3 Encounters:  04/18/23 173 lb (78.5 kg)  04/04/23 174 lb (78.9 kg)  03/17/23 170 lb (77.1 kg)    Body mass index is 30.65 kg/m.   PHYSICAL EXAM:   Physical Exam Vitals and nursing note reviewed. Exam conducted with a chaperone present.  Constitutional:      Appearance: Normal appearance.  Cardiovascular:     Rate and Rhythm: Normal rate and regular rhythm.     Pulses: Normal pulses.     Heart sounds: Normal heart sounds.  Pulmonary:     Effort: Pulmonary effort is normal.     Breath sounds: Normal breath sounds.  Abdominal:     Palpations: Abdomen is soft. There is no  hepatomegaly, splenomegaly or mass.     Tenderness: There is no abdominal tenderness.  Musculoskeletal:     Right lower leg: No edema.     Left lower leg: No edema.  Lymphadenopathy:     Cervical: No cervical adenopathy.     Right cervical: No superficial, deep or posterior cervical adenopathy.    Left cervical: No superficial, deep or posterior cervical adenopathy.     Upper Body:     Right upper body: No supraclavicular or axillary adenopathy.     Left upper body: No supraclavicular or axillary adenopathy.  Neurological:     General: No focal deficit present.     Mental Status: She is alert and oriented to person, place, and time.  Psychiatric:        Mood and Affect: Mood normal.        Behavior: Behavior  normal.     LABS:      Latest Ref Rng & Units 04/18/2023    9:02 AM 04/04/2023    8:33 AM 05/08/2022    8:38 AM  CBC  WBC 4.0 - 10.5 K/uL 3.7  3.2  3.1   Hemoglobin 12.0 - 15.0 g/dL 16.1  09.6  04.5   Hematocrit 36.0 - 46.0 % 40.0  39.4  38.6   Platelets 150 - 400 K/uL 119  100.0  97.0       Latest Ref Rng & Units 04/04/2023    8:33 AM 06/10/2022    8:03 AM 05/08/2022    8:38 AM  CMP  Glucose 70 - 99 mg/dL 95   409   BUN 6 - 23 mg/dL 17   14   Creatinine 8.11 - 1.20 mg/dL 9.14   7.82   Sodium 956 - 145 mEq/L 142   138   Potassium 3.5 - 5.1 mEq/L 3.9   3.6   Chloride 96 - 112 mEq/L 104   101   CO2 19 - 32 mEq/L 31   32   Calcium 8.4 - 10.5 mg/dL 8.6 - 21.3 mg/dL 08.6    57.8  46.9  62.9   Total Protein 6.0 - 8.3 g/dL 7.1  8.1  7.7   Total Bilirubin 0.2 - 1.2 mg/dL 0.5  0.7  0.7   Alkaline Phos 39 - 117 U/L 70  101  91   AST 0 - 37 U/L 19  33  22   ALT 0 - 35 U/L 16  35  22      No results found for: "CEA1", "CEA" / No results found for: "CEA1", "CEA" No results found for: "PSA1" No results found for: "CAN199" No results found for: "CAN125"  Lab Results  Component Value Date   TOTALPROTELP 7.6 01/04/2019   ALBUMINELP 3.3 01/04/2019   A1GS 0.3 01/04/2019    A2GS 0.9 01/04/2019   BETS 0.8 01/04/2019   GAMS 2.4 (H) 01/04/2019   MSPIKE 1.7 (H) 01/04/2019   SPEI Comment 01/04/2019   Lab Results  Component Value Date   TIBC 404 04/18/2023   TIBC 309 04/21/2017   TIBC 308 03/16/2014   FERRITIN 14 04/18/2023   FERRITIN 48 01/04/2019   FERRITIN 50 08/31/2018   IRONPCTSAT 32 (H) 04/18/2023   IRONPCTSAT 18 04/21/2017   IRONPCTSAT 33 03/16/2014   Lab Results  Component Value Date   LDH 118 04/18/2023   LDH 97 (L) 01/04/2019   LDH 105 08/31/2018     STUDIES:   No results found.

## 2023-04-17 NOTE — Telephone Encounter (Signed)
Letter in IN box  To excuse from jury duty due to age

## 2023-04-17 NOTE — Telephone Encounter (Signed)
Pt notified letter ready for pick up. 

## 2023-04-18 ENCOUNTER — Inpatient Hospital Stay: Payer: Medicare PPO

## 2023-04-18 ENCOUNTER — Encounter: Payer: Self-pay | Admitting: Hematology

## 2023-04-18 ENCOUNTER — Inpatient Hospital Stay: Payer: Medicare PPO | Attending: Hematology | Admitting: Hematology

## 2023-04-18 VITALS — BP 138/72 | HR 63 | Temp 98.0°F | Resp 16 | Ht 63.0 in | Wt 173.0 lb

## 2023-04-18 DIAGNOSIS — D696 Thrombocytopenia, unspecified: Secondary | ICD-10-CM | POA: Diagnosis not present

## 2023-04-18 DIAGNOSIS — D469 Myelodysplastic syndrome, unspecified: Secondary | ICD-10-CM

## 2023-04-18 DIAGNOSIS — D472 Monoclonal gammopathy: Secondary | ICD-10-CM | POA: Diagnosis not present

## 2023-04-18 DIAGNOSIS — D72819 Decreased white blood cell count, unspecified: Secondary | ICD-10-CM | POA: Insufficient documentation

## 2023-04-18 DIAGNOSIS — Z79899 Other long term (current) drug therapy: Secondary | ICD-10-CM | POA: Insufficient documentation

## 2023-04-18 DIAGNOSIS — Z809 Family history of malignant neoplasm, unspecified: Secondary | ICD-10-CM | POA: Insufficient documentation

## 2023-04-18 DIAGNOSIS — D708 Other neutropenia: Secondary | ICD-10-CM

## 2023-04-18 LAB — CBC WITH DIFFERENTIAL/PLATELET
Abs Immature Granulocytes: 0.01 10*3/uL (ref 0.00–0.07)
Basophils Absolute: 0 10*3/uL (ref 0.0–0.1)
Basophils Relative: 1 %
Eosinophils Absolute: 0.1 10*3/uL (ref 0.0–0.5)
Eosinophils Relative: 4 %
HCT: 40 % (ref 36.0–46.0)
Hemoglobin: 12.7 g/dL (ref 12.0–15.0)
Immature Granulocytes: 0 %
Lymphocytes Relative: 39 %
Lymphs Abs: 1.4 10*3/uL (ref 0.7–4.0)
MCH: 31.5 pg (ref 26.0–34.0)
MCHC: 31.8 g/dL (ref 30.0–36.0)
MCV: 99.3 fL (ref 80.0–100.0)
Monocytes Absolute: 0.4 10*3/uL (ref 0.1–1.0)
Monocytes Relative: 11 %
Neutro Abs: 1.7 10*3/uL (ref 1.7–7.7)
Neutrophils Relative %: 45 %
Platelets: 119 10*3/uL — ABNORMAL LOW (ref 150–400)
RBC: 4.03 MIL/uL (ref 3.87–5.11)
RDW: 12.5 % (ref 11.5–15.5)
WBC: 3.7 10*3/uL — ABNORMAL LOW (ref 4.0–10.5)
nRBC: 0 % (ref 0.0–0.2)

## 2023-04-18 LAB — RETICULOCYTES
Immature Retic Fract: 9 % (ref 2.3–15.9)
RBC.: 4.11 MIL/uL (ref 3.87–5.11)
Retic Count, Absolute: 49.7 10*3/uL (ref 19.0–186.0)
Retic Ct Pct: 1.2 % (ref 0.4–3.1)

## 2023-04-18 LAB — FOLATE: Folate: 11.6 ng/mL (ref >5.9)

## 2023-04-18 LAB — DIRECT ANTIGLOBULIN TEST (NOT AT ARMC)
DAT, IgG: NEGATIVE
DAT, complement: NEGATIVE

## 2023-04-18 LAB — IRON AND TIBC
Iron: 131 ug/dL (ref 28–170)
Saturation Ratios: 32 % — ABNORMAL HIGH (ref 10.4–31.8)
TIBC: 404 ug/dL (ref 250–450)
UIBC: 273 ug/dL

## 2023-04-18 LAB — VITAMIN B12: Vitamin B-12: 835 pg/mL (ref 180–914)

## 2023-04-18 LAB — LACTATE DEHYDROGENASE: LDH: 118 U/L (ref 98–192)

## 2023-04-18 LAB — FERRITIN: Ferritin: 14 ng/mL (ref 11–307)

## 2023-04-18 LAB — URIC ACID: Uric Acid, Serum: 5.7 mg/dL (ref 2.5–7.1)

## 2023-04-18 NOTE — Patient Instructions (Signed)
[facility]  Discharge Instructions  You were seen and examined today by Dr. Ellin Saba. Dr. Ellin Saba is a hematologist, meaning that he specializes in blood abnormalities. Dr. Ellin Saba discussed your past medical history, family history of cancers/blood conditions and the events that led to you being here today.  You were referred to Dr. Ellin Saba due to low blood counts. You were previously seen in the clinic due to an elevated protein level in the blood.  Dr. Ellin Saba has recommended additional labs today for further evaluation. Dr. Ellin Saba has also recommended a bone survey - to ensure the abnormal protein presence in the blood.  Follow-up as scheduled.  Thank you for choosing [facility] to provide your oncology and hematology care.   To afford each patient quality time with our provider, please arrive at least 15 minutes before your scheduled appointment time. You may need to reschedule your appointment if you arrive late (10 or more minutes). Arriving late affects you and other patients whose appointments are after yours.  Also, if you miss three or more appointments without notifying the office, you may be dismissed from the clinic at the provider's discretion.    Again, thank you for choosing [facility].  Our hope is that these requests will decrease the amount of time that you wait before being seen by our physicians.   If you have a lab appointment with the Cancer Center - please note that after April 8th, all labs will be drawn in the cancer center.  You do not have to check in or register with the main entrance as you have in the past but will complete your check-in at the cancer center.            _____________________________________________________________  Should you have questions after your visit to [facility], please contact our office at [phone] and follow the prompts.  Our  office hours are 8:00 a.m. to 4:30 p.m. Monday - Thursday and 8:00 a.m. to 2:30 p.m. Friday.  Please note that voicemails left after 4:00 p.m. may not be returned until the following business day.  We are closed weekends and all major holidays.  You do have access to a nurse 24-7, just call the main number to the clinic [phone] and do not press any options, hold on the line and a nurse will answer the phone.    For prescription refill requests, have your pharmacy contact our office and allow 72 hours.    Masks are no longer required in the cancer centers. If you would like for your care team to wear a mask while they are taking care of you, please let them know. You may have one support person who is at least 76 years old accompany you for your appointments.

## 2023-04-19 ENCOUNTER — Other Ambulatory Visit: Payer: Self-pay | Admitting: Family Medicine

## 2023-04-19 LAB — BETA 2 MICROGLOBULIN, SERUM: Beta-2 Microglobulin: 1.8 mg/L (ref 0.6–2.4)

## 2023-04-21 LAB — METHYLMALONIC ACID, SERUM: Methylmalonic Acid, Quantitative: 140 nmol/L (ref 0–378)

## 2023-04-21 LAB — SURGICAL PATHOLOGY

## 2023-04-22 ENCOUNTER — Other Ambulatory Visit: Payer: Self-pay | Admitting: Family Medicine

## 2023-04-22 LAB — VISCOSITY, SERUM: Viscosity, Serum: 2 rel.saline (ref 1.4–2.1)

## 2023-04-23 LAB — PROTEIN ELECTROPHORESIS, SERUM
A/G Ratio: 0.8 (ref 0.7–1.7)
Albumin ELP: 3.2 g/dL (ref 2.9–4.4)
Alpha-1-Globulin: 0.3 g/dL (ref 0.0–0.4)
Alpha-2-Globulin: 0.8 g/dL (ref 0.4–1.0)
Beta Globulin: 1 g/dL (ref 0.7–1.3)
Gamma Globulin: 2.1 g/dL — ABNORMAL HIGH (ref 0.4–1.8)
Globulin, Total: 4.2 g/dL — ABNORMAL HIGH (ref 2.2–3.9)
M-Spike, %: 1.1 g/dL — ABNORMAL HIGH
Total Protein ELP: 7.4 g/dL (ref 6.0–8.5)

## 2023-04-23 LAB — KAPPA/LAMBDA LIGHT CHAINS
Kappa free light chain: 14.9 mg/L (ref 3.3–19.4)
Kappa, lambda light chain ratio: 0.57 (ref 0.26–1.65)
Lambda free light chains: 26 mg/L (ref 5.7–26.3)

## 2023-04-23 LAB — COPPER, SERUM: Copper: 161 ug/dL — ABNORMAL HIGH (ref 80–158)

## 2023-04-24 LAB — IMMUNOFIXATION ELECTROPHORESIS
IgA: 106 mg/dL (ref 64–422)
IgG (Immunoglobin G), Serum: 875 mg/dL (ref 586–1602)
IgM (Immunoglobulin M), Srm: 2051 mg/dL — ABNORMAL HIGH (ref 26–217)
Total Protein ELP: 7.5 g/dL (ref 6.0–8.5)

## 2023-04-28 DIAGNOSIS — H01024 Squamous blepharitis left upper eyelid: Secondary | ICD-10-CM | POA: Diagnosis not present

## 2023-04-28 DIAGNOSIS — H43393 Other vitreous opacities, bilateral: Secondary | ICD-10-CM | POA: Diagnosis not present

## 2023-04-28 DIAGNOSIS — H01021 Squamous blepharitis right upper eyelid: Secondary | ICD-10-CM | POA: Diagnosis not present

## 2023-04-28 DIAGNOSIS — H43813 Vitreous degeneration, bilateral: Secondary | ICD-10-CM | POA: Diagnosis not present

## 2023-04-28 DIAGNOSIS — B880 Other acariasis: Secondary | ICD-10-CM | POA: Diagnosis not present

## 2023-04-28 DIAGNOSIS — H25013 Cortical age-related cataract, bilateral: Secondary | ICD-10-CM | POA: Diagnosis not present

## 2023-04-28 DIAGNOSIS — H2513 Age-related nuclear cataract, bilateral: Secondary | ICD-10-CM | POA: Diagnosis not present

## 2023-04-30 ENCOUNTER — Ambulatory Visit
Admission: RE | Admit: 2023-04-30 | Discharge: 2023-04-30 | Disposition: A | Discharge status: Home / Self Care | Payer: Medicare PPO | Source: Ambulatory Visit | Attending: Family Medicine | Admitting: Family Medicine

## 2023-04-30 DIAGNOSIS — Z1231 Encounter for screening mammogram for malignant neoplasm of breast: Secondary | ICD-10-CM | POA: Diagnosis not present

## 2023-05-04 NOTE — Progress Notes (Signed)
[facility] [address] [place] [address]    Clinic Day:  05/05/2023  Referring physician: Tower, Audrie Gallus, MD  Patient Care Team: Tower, Audrie Gallus, MD as PCP - General Wendall Stade, MD as PCP - Cardiology (Cardiology) Dorisann Frames, MD as Referring Physician (Endocrinology) Doreatha Massed, MD as Medical Oncologist (Medical Oncology)   ASSESSMENT & PLAN:   Assessment: 1.  Mild leukopenia and thrombocytopenia: - Patient seen at the request of Dr. Milinda Antis - She has intermittent mild thrombocytopenia since 2010.  Intermittent mild leukopenia since 2009. - No recurrent infections.  No tingling or numbness in extremities.  No headaches or vision changes.  No B symptoms. - She reports worsening tiredness in the last 6 months.   2.  IgM lambda monoclonal gammopathy: - She was previously found to have 1.7 g of IgM lambda monoclonal protein. - BMBX (05/26/2017): Hypercellular bone marrow for age with trilineage hematopoiesis.  3% monoclonal plasma cells were seen.  Lymphocytic component is slightly increased in number at 23% and mostly consisting of small lymphoid cells.  Flow cytometry shows predominance of T lymphocytes with no evident phenotype.   3.  Social/family history: - Lives at home with her husband and is independent of ADLs and IADLs.  She keeps her granddaughters after school.  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  Non-smoker.  No chemical exposure. - No family history of malignancies.    Plan: 1.  Mild leukopenia and thrombocytopenia: - She has intermittent mild leukopenia and thrombocytopenia for several years which has been stable.   2.  IgM lambda monoclonal gammopathy: - We reviewed labs from 04/18/2023: M spike is 1.1 g and slightly better.  Hemoglobin-12.7.  Mild leukopenia and thrombocytopenia stable.  Free light chain ratio is normal.  Immunofixation positive for IgM lambda. - Serum viscosity is normal.  Direct Coombs  test is negative. - Flow cytometry shows minor abnormal B-cell population expressing CD20, CD5 and CD200 and kappa light chain restriction. 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  As she does not have any B symptoms or cytopenias, we can closely watch.  No indications for treatment at this time.  RTC 4 months with repeat labs.  3.  Low ferritin levels: - Ferritin is low at 14 and percent saturation 32.  Hemoglobin is normal at 12.7. - She complains of extreme fatigue.  I have recommended Feraheme weekly x 2.    Orders Placed This Encounter  Procedures   CBC with Differential    Standing Status:   Future    Standing Expiration Date:   05/04/2024   Kappa/lambda light chains    Standing Status:   Future    Standing Expiration Date:   05/04/2024   Protein electrophoresis, serum    Standing Status:   Future    Standing Expiration Date:   05/04/2024   Iron and TIBC ([facility] ONLY)    Standing Status:   Future    Standing Expiration Date:   05/04/2024   Ferritin    Standing Status:   Future    Standing Expiration Date:   05/04/2024   Lactate dehydrogenase    Standing Status:   Future    Standing Expiration Date:   05/04/2024   Viscosity, Serum    Standing Status:   Future    Standing Expiration Date:   05/04/2024      I,Katie Daubenspeck,acting as a scribe for Doreatha Massed, MD.,have documented all  relevant documentation on the behalf of Doreatha Massed, MD,as directed by  Doreatha Massed, MD while in the presence of Doreatha Massed, MD.   I, Doreatha Massed MD, have reviewed the above documentation for accuracy and completeness, and I agree with the above.   Doreatha Massed, MD   6/24/20245:29 PM  CHIEF COMPLAINT:   Diagnosis: Leukopenia, thrombocytopenia and MGUS    Cancer Staging  No matching staging information was found for the patient.   Prior Therapy: None  Current Therapy: Observation   HISTORY OF  PRESENT ILLNESS:   Oncology History   No history exists.     INTERVAL HISTORY:   Kathryn Weaver is a 77 y.o. female presenting to clinic today for follow up of Leukopenia, thrombocytopenia and MGUS. She was last seen by me on 04/18/23.  Today, she states that she is doing well overall. Her appetite level is at 100%. Her energy level is at 40%.  PAST MEDICAL HISTORY:   Past Medical History: Past Medical History:  Diagnosis Date   Adenomatous polyp of colon 03/2007   Anemia    s/p GI bleed, hospital 5/08   Anxiety    Depression    Diverticulosis    Endometriosis    uterine US 06/2003   GERD (gastroesophageal reflux disease)    Hiatal hernia    EGD 5/08- stricture, HH, gastritis, GERD   History of blood transfusion    Hyperlipidemia    Hypertension    Hypertension    Iron deficiency 03/16/2014   Myelodysplastic disease (HCC)     Surgical History: Past Surgical History:  Procedure Laterality Date   cardiac CT  9/12   normal    CHOLECYSTECTOMY N/A 09/18/2021   Procedure: LAPAROSCOPIC CHOLECYSTECTOMY;  Surgeon: Gaynelle Adu, MD;  Location: [facility] OR;  Service: General;  Laterality: N/A;   HAMMER TOE SURGERY Left 12/02/2017   INTRAOPERATIVE CHOLANGIOGRAM N/A 09/18/2021   Procedure: INTRAOPERATIVE CHOLANGIOGRAM;  Surgeon: Gaynelle Adu, MD;  Location: [facility] OR;  Service: General;  Laterality: N/A;   KNEE SURGERY Left    Cyst behind left knee.   TUBAL LIGATION      Social History: Social History   Socioeconomic History   Marital status: Married    Spouse name: Not on file   Number of children: Not on file   Years of education: Not on file   Highest education level: Not on file  Occupational History   Occupation: [employer]  Tobacco Use   Smoking status: Never   Smokeless tobacco: Never  Vaping Use   Vaping Use: Never used  Substance and Sexual Activity   Alcohol use: No    Alcohol/week: 0.0 standard drinks of alcohol   Drug use: No   Sexual activity: Yes  Other  Topics Concern   Not on file  Social History Narrative   Not on file   Social Determinants of Health   Financial Resource Strain: Low Risk  (03/17/2023)   Overall Financial Resource Strain (CARDIA)    Difficulty of Paying Living Expenses: Not hard at all  Food Insecurity: No Food Insecurity (03/17/2023)   Hunger Vital Sign    Worried About Running Out of Food in the Last Year: Never true    Ran Out of Food in the Last Year: Never true  Transportation Needs: No Transportation Needs (03/17/2023)   PRAPARE - Administrator, Civil Service (Medical): No    Lack of Transportation (Non-Medical): No  Physical Activity: Insufficiently Active (03/17/2023)   Exercise Vital  Sign    Days of Exercise per Week: 3 days    Minutes of Exercise per Session: 30 min  Stress: No Stress Concern Present (03/17/2023)   [employer] of Occupational Health - Occupational Stress Questionnaire    Feeling of Stress : Not at all  Social Connections: Moderately Integrated (03/17/2023)   Social Connection and Isolation Panel [NHANES]    Frequency of Communication with Friends and Family: More than three times a week    Frequency of Social Gatherings with Friends and Family: More than three times a week    Attends Religious Services: More than 4 times per year    Active Member of [employer] or Organizations: No    Attends Banker Meetings: Never    Marital Status: Married  Catering manager Violence: Not At Risk (03/17/2023)   Humiliation, Afraid, Rape, and Kick questionnaire    Fear of Current or Ex-Partner: No    Emotionally Abused: No    Physically Abused: No    Sexually Abused: No    Family History: Family History  Problem Relation Age of Onset   Cancer Father        unknown type   Hypertension Mother    Colon cancer Neg Hx    Esophageal cancer Neg Hx    Rectal cancer Neg Hx    Stomach cancer Neg Hx     Current Medications:  Current Outpatient Medications:    atorvastatin (LIPITOR)  10 MG tablet, TAKE 1 TABLET BY MOUTH EVERY DAY, Disp: 90 tablet, Rfl: 1   gabapentin (NEURONTIN) 300 MG capsule, TAKE 1 CAPSULE BY MOUTH THREE TIMES A DAY, Disp: 90 capsule, Rfl: 3   hydrochlorothiazide (HYDRODIURIL) 25 MG tablet, TAKE 1 TABLET (25 MG TOTAL) BY MOUTH DAILY., Disp: 90 tablet, Rfl: 1   sertraline (ZOLOFT) 50 MG tablet, TAKE 1 TABLET BY MOUTH EVERY DAY, Disp: 90 tablet, Rfl: 1   Allergies: No Known Allergies  REVIEW OF SYSTEMS:   Review of Systems  Constitutional:  Negative for chills, fatigue and fever.  HENT:   Negative for lump/mass, mouth sores, nosebleeds, sore throat and trouble swallowing.   Eyes:  Negative for eye problems.  Respiratory:  Negative for cough and shortness of breath.   Cardiovascular:  Negative for chest pain, leg swelling and palpitations.  Gastrointestinal:  Negative for abdominal pain, constipation, diarrhea, nausea and vomiting.  Genitourinary:  Negative for bladder incontinence, difficulty urinating, dysuria, frequency, hematuria and nocturia.   Musculoskeletal:  Negative for arthralgias, back pain, flank pain, myalgias and neck pain.  Skin:  Negative for itching and rash.  Neurological:  Negative for dizziness, headaches and numbness.  Hematological:  Does not bruise/bleed easily.  Psychiatric/Behavioral:  Negative for depression, sleep disturbance and suicidal ideas. The patient is not nervous/anxious.   All other systems reviewed and are negative.    VITALS:   Blood pressure (!) 151/62, pulse (!) 54, resp. rate 17, height 5\' 3"  (1.6 m), weight 176 lb 14.4 oz (80.2 kg), SpO2 100 %.  Wt Readings from Last 3 Encounters:  05/05/23 176 lb 14.4 oz (80.2 kg)  04/18/23 173 lb (78.5 kg)  04/04/23 174 lb (78.9 kg)    Body mass index is 31.34 kg/m.  Performance status (ECOG): 1 - Symptomatic but completely ambulatory  PHYSICAL EXAM:   Physical Exam Vitals and nursing note reviewed. Exam conducted with a chaperone present.  Constitutional:       Appearance: Normal appearance.  Cardiovascular:     Rate and Rhythm:  Normal rate and regular rhythm.     Pulses: Normal pulses.     Heart sounds: Normal heart sounds.  Pulmonary:     Effort: Pulmonary effort is normal.     Breath sounds: Normal breath sounds.  Abdominal:     Palpations: Abdomen is soft. There is no hepatomegaly, splenomegaly or mass.     Tenderness: There is no abdominal tenderness.  Musculoskeletal:     Right lower leg: No edema.     Left lower leg: No edema.  Lymphadenopathy:     Cervical: No cervical adenopathy.     Right cervical: No superficial, deep or posterior cervical adenopathy.    Left cervical: No superficial, deep or posterior cervical adenopathy.     Upper Body:     Right upper body: No supraclavicular or axillary adenopathy.     Left upper body: No supraclavicular or axillary adenopathy.  Neurological:     General: No focal deficit present.     Mental Status: She is alert and oriented to person, place, and time.  Psychiatric:        Mood and Affect: Mood normal.        Behavior: Behavior normal.     LABS:      Latest Ref Rng & Units 04/18/2023    9:02 AM 04/04/2023    8:33 AM 05/08/2022    8:38 AM  CBC  WBC 4.0 - 10.5 K/uL 3.7  3.2  3.1   Hemoglobin 12.0 - 15.0 g/dL 25.3  66.4  40.3   Hematocrit 36.0 - 46.0 % 40.0  39.4  38.6   Platelets 150 - 400 K/uL 119  100.0  97.0       Latest Ref Rng & Units 04/04/2023    8:33 AM 06/10/2022    8:03 AM 05/08/2022    8:38 AM  CMP  Glucose 70 - 99 mg/dL 95   474   BUN 6 - 23 mg/dL 17   14   Creatinine 2.59 - 1.20 mg/dL 5.63   8.75   Sodium 643 - 145 mEq/L 142   138   Potassium 3.5 - 5.1 mEq/L 3.9   3.6   Chloride 96 - 112 mEq/L 104   101   CO2 19 - 32 mEq/L 31   32   Calcium 8.4 - 10.5 mg/dL 8.6 - 32.9 mg/dL 51.8    84.1  66.0  63.0   Total Protein 6.0 - 8.3 g/dL 7.1  8.1  7.7   Total Bilirubin 0.2 - 1.2 mg/dL 0.5  0.7  0.7   Alkaline Phos 39 - 117 U/L 70  101  91   AST 0 - 37 U/L 19  33   22   ALT 0 - 35 U/L 16  35  22      No results found for: "CEA1", "CEA" / No results found for: "CEA1", "CEA" No results found for: "PSA1" No results found for: "CAN199" No results found for: "CAN125"  Lab Results  Component Value Date   TOTALPROTELP 7.4 04/18/2023   ALBUMINELP 3.2 04/18/2023   A1GS 0.3 04/18/2023   A2GS 0.8 04/18/2023   BETS 1.0 04/18/2023   GAMS 2.1 (H) 04/18/2023   MSPIKE 1.1 (H) 04/18/2023   SPEI Comment 04/18/2023   Lab Results  Component Value Date   TIBC 404 04/18/2023   TIBC 309 04/21/2017   TIBC 308 03/16/2014   FERRITIN 14 04/18/2023   FERRITIN 48 01/04/2019   FERRITIN 50 08/31/2018   IRONPCTSAT  32 (H) 04/18/2023   IRONPCTSAT 18 04/21/2017   IRONPCTSAT 33 03/16/2014   Lab Results  Component Value Date   LDH 118 04/18/2023   LDH 97 (L) 01/04/2019   LDH 105 08/31/2018     STUDIES:   MM 3D SCREENING MAMMOGRAM BILATERAL BREAST  Result Date: 05/01/2023 CLINICAL DATA:  Screening. EXAM: DIGITAL SCREENING BILATERAL MAMMOGRAM WITH TOMOSYNTHESIS AND CAD TECHNIQUE: Bilateral screening digital craniocaudal and mediolateral oblique mammograms were obtained. Bilateral screening digital breast tomosynthesis was performed. COMPARISON:  Previous exam(s). ACR Breast Density Category b: There are scattered areas of fibroglandular density. FINDINGS: There are no findings suspicious for malignancy. IMPRESSION: No mammographic evidence of malignancy. A result letter of this screening mammogram will be mailed directly to the patient. RECOMMENDATION: Screening mammogram in one year. (Code:SM-B-01Y) BI-RADS CATEGORY  1: Negative. Electronically Signed   By: Sherron Ales M.D.   On: 05/01/2023 15:12

## 2023-05-05 ENCOUNTER — Inpatient Hospital Stay: Payer: Medicare PPO | Admitting: Hematology

## 2023-05-05 VITALS — BP 151/62 | HR 54 | Resp 17 | Ht 63.0 in | Wt 176.9 lb

## 2023-05-05 DIAGNOSIS — D696 Thrombocytopenia, unspecified: Secondary | ICD-10-CM | POA: Diagnosis not present

## 2023-05-05 DIAGNOSIS — D472 Monoclonal gammopathy: Secondary | ICD-10-CM | POA: Diagnosis not present

## 2023-05-05 DIAGNOSIS — D508 Other iron deficiency anemias: Secondary | ICD-10-CM

## 2023-05-05 DIAGNOSIS — Z79899 Other long term (current) drug therapy: Secondary | ICD-10-CM | POA: Diagnosis not present

## 2023-05-05 DIAGNOSIS — D72819 Decreased white blood cell count, unspecified: Secondary | ICD-10-CM | POA: Diagnosis not present

## 2023-05-05 DIAGNOSIS — D509 Iron deficiency anemia, unspecified: Secondary | ICD-10-CM | POA: Insufficient documentation

## 2023-05-05 DIAGNOSIS — Z809 Family history of malignant neoplasm, unspecified: Secondary | ICD-10-CM | POA: Diagnosis not present

## 2023-05-05 NOTE — Patient Instructions (Signed)
[facility] Discharge Instructions   You were seen and examined today by Dr. Ellin Saba.  He reviewed the results of your lab work that we did at your previous visit. All results are normal/stable. The tests we did indicate that we do not need to do any treatment for your blood condition at this time.  Your iron was low. We will arrange for you to have iron infusions in the clinic. You will come two weeks in a row to get these infusions.   We will see you back in 4 months. We will repeat lab work prior to your next visit.    Thank you for choosing [facility] at [facility] to provide your oncology and hematology care.  To afford each patient quality time with our provider, please arrive at least 15 minutes before your scheduled appointment time.   If you have a lab appointment with the Cancer Center please come in thru the Main Entrance and check in at the main information desk.  You need to re-schedule your appointment should you arrive 10 or more minutes late.  We strive to give you quality time with our providers, and arriving late affects you and other patients whose appointments are after yours.  Also, if you no show three or more times for appointments you may be dismissed from the clinic at the providers discretion.     Again, thank you for choosing [facility].  Our hope is that these requests will decrease the amount of time that you wait before being seen by our physicians.       _____________________________________________________________  Should you have questions after your visit to [facility], please contact our office at [phone] and follow the prompts.  Our office hours are 8:00 a.m. and 4:30 p.m. Monday - Friday.  Please note that voicemails left after 4:00 p.m. may not be returned until the following business day.  We are closed weekends and major holidays.  You do have access to a  nurse 24-7, just call the main number to the clinic [phone] and do not press any options, hold on the line and a nurse will answer the phone.    For prescription refill requests, have your pharmacy contact our office and allow 72 hours.    Due to Covid, you will need to wear a mask upon entering the hospital. If you do not have a mask, a mask will be given to you at the Main Entrance upon arrival. For doctor visits, patients may have 1 support person age 84 or older with them. For treatment visits, patients can not have anyone with them due to social distancing guidelines and our immunocompromised population.

## 2023-05-08 DIAGNOSIS — B0223 Postherpetic polyneuropathy: Secondary | ICD-10-CM | POA: Diagnosis not present

## 2023-05-08 DIAGNOSIS — E785 Hyperlipidemia, unspecified: Secondary | ICD-10-CM | POA: Diagnosis not present

## 2023-05-08 DIAGNOSIS — E669 Obesity, unspecified: Secondary | ICD-10-CM | POA: Diagnosis not present

## 2023-05-08 DIAGNOSIS — M8580 Other specified disorders of bone density and structure, unspecified site: Secondary | ICD-10-CM | POA: Diagnosis not present

## 2023-05-08 DIAGNOSIS — F324 Major depressive disorder, single episode, in partial remission: Secondary | ICD-10-CM | POA: Diagnosis not present

## 2023-05-08 DIAGNOSIS — M1990 Unspecified osteoarthritis, unspecified site: Secondary | ICD-10-CM | POA: Diagnosis not present

## 2023-05-08 DIAGNOSIS — K5900 Constipation, unspecified: Secondary | ICD-10-CM | POA: Diagnosis not present

## 2023-05-08 DIAGNOSIS — N189 Chronic kidney disease, unspecified: Secondary | ICD-10-CM | POA: Diagnosis not present

## 2023-05-08 DIAGNOSIS — H9191 Unspecified hearing loss, right ear: Secondary | ICD-10-CM | POA: Diagnosis not present

## 2023-05-09 LAB — FLOW CYTOMETRY

## 2023-05-13 ENCOUNTER — Inpatient Hospital Stay: Payer: Medicare PPO | Attending: Hematology

## 2023-05-13 VITALS — BP 126/44 | HR 50 | Temp 97.8°F | Resp 18

## 2023-05-13 DIAGNOSIS — E611 Iron deficiency: Secondary | ICD-10-CM | POA: Diagnosis present

## 2023-05-13 DIAGNOSIS — D508 Other iron deficiency anemias: Secondary | ICD-10-CM

## 2023-05-13 MED ORDER — SODIUM CHLORIDE 0.9 % IV SOLN
510.0000 mg | Freq: Once | INTRAVENOUS | Status: AC
  Administered 2023-05-13: 510 mg via INTRAVENOUS
  Filled 2023-05-13: qty 510

## 2023-05-13 MED ORDER — SODIUM CHLORIDE 0.9 % IV SOLN: Freq: Once | INTRAVENOUS | Status: AC

## 2023-05-13 NOTE — Progress Notes (Signed)
Patient presents today for iron infusion.  Patient is in satisfactory condition with no new complaints voiced.  Vital signs are stable.  We will proceed with infusion per provider orders.    Peripheral IV started with good blood return pre and post infusion.  Feraheme 510 mg  given today per MD orders. Tolerated infusion without adverse affects. Vital signs stable. No complaints at this time. Discharged from clinic ambulatory in stable condition. Alert and oriented x 3. F/U with [facility] as scheduled.   

## 2023-05-13 NOTE — Patient Instructions (Signed)
[facility]  Discharge Instructions: Thank you for choosing [facility] to provide your oncology and hematology care.  If you have a lab appointment with the Cancer Center - please note that after April 8th, 2024, all labs will be drawn in the cancer center.  You do not have to check in or register with the main entrance as you have in the past but will complete your check-in in the cancer center.  Wear comfortable clothing and clothing appropriate for easy access to any Portacath or PICC line.   We strive to give you quality time with your provider. You may need to reschedule your appointment if you arrive late (15 or more minutes).  Arriving late affects you and other patients whose appointments are after yours.  Also, if you miss three or more appointments without notifying the office, you may be dismissed from the clinic at the provider's discretion.      For prescription refill requests, have your pharmacy contact our office and allow 72 hours for refills to be completed.    Today you received the following chemotherapy and/or immunotherapy agents Feraheme IV iron infusion.   To help prevent nausea and vomiting after your treatment, we encourage you to take your nausea medication as directed.  BELOW ARE SYMPTOMS THAT SHOULD BE REPORTED IMMEDIATELY: *FEVER GREATER THAN 100.4 F (38 C) OR HIGHER *CHILLS OR SWEATING *NAUSEA AND VOMITING THAT IS NOT CONTROLLED WITH YOUR NAUSEA MEDICATION *UNUSUAL SHORTNESS OF BREATH *UNUSUAL BRUISING OR BLEEDING *URINARY PROBLEMS (pain or burning when urinating, or frequent urination) *BOWEL PROBLEMS (unusual diarrhea, constipation, pain near the anus) TENDERNESS IN MOUTH AND THROAT WITH OR WITHOUT PRESENCE OF ULCERS (sore throat, sores in mouth, or a toothache) UNUSUAL RASH, SWELLING OR PAIN  UNUSUAL VAGINAL DISCHARGE OR ITCHING   Items with * indicate a potential emergency and should be followed up as soon as possible  or go to the Emergency Department if any problems should occur.  Please show the CHEMOTHERAPY ALERT CARD or IMMUNOTHERAPY ALERT CARD at check-in to the Emergency Department and triage nurse.  Should you have questions after your visit or need to cancel or reschedule your appointment, please contact [facility] [phone]  and follow the prompts.  Office hours are 8:00 a.m. to 4:30 p.m. Monday - Friday. Please note that voicemails left after 4:00 p.m. may not be returned until the following business day.  We are closed weekends and major holidays. You have access to a nurse at all times for urgent questions. Please call the main number to the clinic [phone] and follow the prompts.  For any non-urgent questions, you may also contact your provider using MyChart. We now offer e-Visits for anyone 68 and older to request care online for non-urgent symptoms. For details visit [email or web address].   Also download the MyChart app! Go to the app store, search "MyChart", open the app, select [facility], and log in with your MyChart username and password.

## 2023-05-26 ENCOUNTER — Inpatient Hospital Stay: Payer: Medicare PPO

## 2023-05-26 VITALS — BP 148/66 | HR 45 | Temp 97.0°F | Resp 18

## 2023-05-26 DIAGNOSIS — D508 Other iron deficiency anemias: Secondary | ICD-10-CM

## 2023-05-26 DIAGNOSIS — E611 Iron deficiency: Secondary | ICD-10-CM | POA: Diagnosis not present

## 2023-05-26 MED ORDER — SODIUM CHLORIDE 0.9 % IV SOLN
510.0000 mg | Freq: Once | INTRAVENOUS | Status: AC
  Administered 2023-05-26: 510 mg via INTRAVENOUS
  Filled 2023-05-26: qty 510

## 2023-05-26 MED ORDER — SODIUM CHLORIDE 0.9 % IV SOLN: Freq: Once | INTRAVENOUS | Status: AC

## 2023-05-26 NOTE — Progress Notes (Signed)
 Feraheme given today per MD orders. Tolerated infusion without adverse affects. Vital signs stable. No complaints at this time. Discharged from clinic ambulatory in stable condition. Alert and oriented x 3. F/U with [facility] as scheduled.  

## 2023-05-26 NOTE — Patient Instructions (Signed)
[facility]  Discharge Instructions: Thank you for choosing [facility] to provide your oncology and hematology care.  If you have a lab appointment with the Cancer Center - please note that after April 8th, 2024, all labs will be drawn in the cancer center.  You do not have to check in or register with the main entrance as you have in the past but will complete your check-in in the cancer center.  Wear comfortable clothing and clothing appropriate for easy access to any Portacath or PICC line.   We strive to give you quality time with your provider. You may need to reschedule your appointment if you arrive late (15 or more minutes).  Arriving late affects you and other patients whose appointments are after yours.  Also, if you miss three or more appointments without notifying the office, you may be dismissed from the clinic at the provider's discretion.      For prescription refill requests, have your pharmacy contact our office and allow 72 hours for refills to be completed.    Today you received the following chemotherapy and/or immunotherapy agents Feraheme. Ferumoxytol Injection What is this medication? FERUMOXYTOL (FER ue MOX i tol) treats low levels of iron in your body (iron deficiency anemia). Iron is a mineral that plays an important role in making red blood cells, which carry oxygen from your lungs to the rest of your body. This medicine may be used for other purposes; ask your health care provider or pharmacist if you have questions. COMMON BRAND NAME(S): Feraheme What should I tell my care team before I take this medication? They need to know if you have any of these conditions: Anemia not caused by low iron levels High levels of iron in the blood Magnetic resonance imaging (MRI) test scheduled An unusual or allergic reaction to iron, other medications, foods, dyes, or preservatives Pregnant or trying to get pregnant Breastfeeding How should I  use this medication? This medication is injected into a vein. It is given by your care team in a hospital or clinic setting. Talk to your care team the use of this medication in children. Special care may be needed. Overdosage: If you think you have taken too much of this medicine contact a poison control center or emergency room at once. NOTE: This medicine is only for you. Do not share this medicine with others. What if I miss a dose? It is important not to miss your dose. Call your care team if you are unable to keep an appointment. What may interact with this medication? Other iron products This list may not describe all possible interactions. Give your health care provider a list of all the medicines, herbs, non-prescription drugs, or dietary supplements you use. Also tell them if you smoke, drink alcohol, or use illegal drugs. Some items may interact with your medicine. What should I watch for while using this medication? Visit your care team regularly. Tell your care team if your symptoms do not start to get better or if they get worse. You may need blood work done while you are taking this medication. You may need to follow a special diet. Talk to your care team. Foods that contain iron include: whole grains/cereals, dried fruits, beans, or peas, leafy green vegetables, and organ meats (liver, kidney). What side effects may I notice from receiving this medication? Side effects that you should report to your care team as soon as possible: Allergic reactions--skin rash, itching, hives, swelling   of the face, lips, tongue, or throat Low blood pressure--dizziness, feeling faint or lightheaded, blurry vision Shortness of breath Side effects that usually do not require medical attention (report to your care team if they continue or are bothersome): Flushing Headache Joint pain Muscle pain Nausea Pain, redness, or irritation at injection site This list may not describe all possible side  effects. Call your doctor for medical advice about side effects. You may report side effects to FDA at 1-800-FDA-1088. Where should I keep my medication? This medication is given in a hospital or clinic and will not be stored at home. NOTE: This sheet is a summary. It may not cover all possible information. If you have questions about this medicine, talk to your doctor, pharmacist, or health care provider.  2024 Elsevier/Gold Standard (2022-05-06 00:00:00)       To help prevent nausea and vomiting after your treatment, we encourage you to take your nausea medication as directed.  BELOW ARE SYMPTOMS THAT SHOULD BE REPORTED IMMEDIATELY: *FEVER GREATER THAN 100.4 F (38 C) OR HIGHER *CHILLS OR SWEATING *NAUSEA AND VOMITING THAT IS NOT CONTROLLED WITH YOUR NAUSEA MEDICATION *UNUSUAL SHORTNESS OF BREATH *UNUSUAL BRUISING OR BLEEDING *URINARY PROBLEMS (pain or burning when urinating, or frequent urination) *BOWEL PROBLEMS (unusual diarrhea, constipation, pain near the anus) TENDERNESS IN MOUTH AND THROAT WITH OR WITHOUT PRESENCE OF ULCERS (sore throat, sores in mouth, or a toothache) UNUSUAL RASH, SWELLING OR PAIN  UNUSUAL VAGINAL DISCHARGE OR ITCHING   Items with * indicate a potential emergency and should be followed up as soon as possible or go to the Emergency Department if any problems should occur.  Please show the CHEMOTHERAPY ALERT CARD or IMMUNOTHERAPY ALERT CARD at check-in to the Emergency Department and triage nurse.  Should you have questions after your visit or need to cancel or reschedule your appointment, please contact [facility] [phone]  and follow the prompts.  Office hours are 8:00 a.m. to 4:30 p.m. Monday - Friday. Please note that voicemails left after 4:00 p.m. may not be returned until the following business day.  We are closed weekends and major holidays. You have access to a nurse at all times for urgent questions. Please call the main number  to the clinic [phone] and follow the prompts.  For any non-urgent questions, you may also contact your provider using MyChart. We now offer e-Visits for anyone 18 and older to request care online for non-urgent symptoms. For details visit [email or web address].   Also download the MyChart app! Go to the app store, search "MyChart", open the app, select , and log in with your MyChart username and password.   

## 2023-07-06 ENCOUNTER — Other Ambulatory Visit: Payer: Self-pay | Admitting: Family Medicine

## 2023-07-06 DIAGNOSIS — B0229 Other postherpetic nervous system involvement: Secondary | ICD-10-CM

## 2023-07-08 NOTE — Telephone Encounter (Signed)
 Spoke to pt, scheduled cpe for 08/01/23

## 2023-07-08 NOTE — Telephone Encounter (Signed)
Gabapentin last filled on 06/18/22 #90 caps with 3 refills.  Other 2 meds also due.  Last OV was for fatigue on 04/04/23, but no lipid labs since 05/08/22 and no future appts. Please advise

## 2023-07-08 NOTE — Telephone Encounter (Signed)
I refilled all times one Please schedule annual exam when able

## 2023-07-23 ENCOUNTER — Telehealth: Payer: Self-pay | Admitting: Family Medicine

## 2023-07-23 DIAGNOSIS — D696 Thrombocytopenia, unspecified: Secondary | ICD-10-CM

## 2023-07-23 DIAGNOSIS — I10 Essential (primary) hypertension: Secondary | ICD-10-CM

## 2023-07-23 DIAGNOSIS — E210 Primary hyperparathyroidism: Secondary | ICD-10-CM

## 2023-07-23 DIAGNOSIS — E7800 Pure hypercholesterolemia, unspecified: Secondary | ICD-10-CM

## 2023-07-23 DIAGNOSIS — R7303 Prediabetes: Secondary | ICD-10-CM

## 2023-07-23 NOTE — Telephone Encounter (Signed)
-----   Message from Alvina Chou sent at 07/09/2023  9:52 AM EDT ----- Regarding: Lab orders for Friday, 9.13.24 Patient is scheduled for CPX labs, please order future labs, Thanks , Camelia Eng

## 2023-07-25 ENCOUNTER — Other Ambulatory Visit: Payer: Medicare PPO

## 2023-07-30 ENCOUNTER — Encounter: Payer: Self-pay | Admitting: Pharmacist

## 2023-07-31 ENCOUNTER — Other Ambulatory Visit (INDEPENDENT_AMBULATORY_CARE_PROVIDER_SITE_OTHER): Payer: Medicare PPO

## 2023-07-31 DIAGNOSIS — R7303 Prediabetes: Secondary | ICD-10-CM

## 2023-07-31 DIAGNOSIS — D696 Thrombocytopenia, unspecified: Secondary | ICD-10-CM | POA: Diagnosis not present

## 2023-07-31 DIAGNOSIS — I10 Essential (primary) hypertension: Secondary | ICD-10-CM

## 2023-07-31 DIAGNOSIS — E7800 Pure hypercholesterolemia, unspecified: Secondary | ICD-10-CM | POA: Diagnosis not present

## 2023-07-31 LAB — CBC WITH DIFFERENTIAL/PLATELET
Basophils Absolute: 0 10*3/uL (ref 0.0–0.1)
Basophils Relative: 0.5 % (ref 0.0–3.0)
Eosinophils Absolute: 0.1 10*3/uL (ref 0.0–0.7)
Eosinophils Relative: 3.7 % (ref 0.0–5.0)
HCT: 41.5 % (ref 36.0–46.0)
Hemoglobin: 13.7 g/dL (ref 12.0–15.0)
Lymphocytes Relative: 54.6 % — ABNORMAL HIGH (ref 12.0–46.0)
Lymphs Abs: 1.9 10*3/uL (ref 0.7–4.0)
MCHC: 33 g/dL (ref 30.0–36.0)
MCV: 98.7 fl (ref 78.0–100.0)
Monocytes Absolute: 0.5 10*3/uL (ref 0.1–1.0)
Monocytes Relative: 13.4 % — ABNORMAL HIGH (ref 3.0–12.0)
Neutro Abs: 0.9 10*3/uL — ABNORMAL LOW (ref 1.4–7.7)
Neutrophils Relative %: 27.8 % — ABNORMAL LOW (ref 43.0–77.0)
Platelets: 74 10*3/uL — ABNORMAL LOW (ref 150.0–400.0)
RBC: 4.21 Mil/uL (ref 3.87–5.11)
RDW: 13.7 % (ref 11.5–15.5)
WBC: 3.4 10*3/uL — ABNORMAL LOW (ref 4.0–10.5)

## 2023-07-31 LAB — LIPID PANEL
Cholesterol: 152 mg/dL (ref 0–200)
HDL: 61.1 mg/dL (ref >39.00)
LDL Cholesterol: 81 mg/dL (ref 0–99)
NonHDL: 90.59
Total CHOL/HDL Ratio: 2
Triglycerides: 48 mg/dL (ref 0.0–149.0)
VLDL: 9.6 mg/dL (ref 0.0–40.0)

## 2023-07-31 LAB — COMPREHENSIVE METABOLIC PANEL
ALT: 16 U/L (ref 0–35)
AST: 17 U/L (ref 0–37)
Albumin: 3.8 g/dL (ref 3.5–5.2)
Alkaline Phosphatase: 80 U/L (ref 39–117)
BUN: 13 mg/dL (ref 6–23)
CO2: 33 mEq/L — ABNORMAL HIGH (ref 19–32)
Calcium: 10.7 mg/dL — ABNORMAL HIGH (ref 8.4–10.5)
Chloride: 102 mEq/L (ref 96–112)
Creatinine, Ser: 0.86 mg/dL (ref 0.40–1.20)
GFR: 65.73 mL/min (ref >60.00)
Glucose, Bld: 107 mg/dL — ABNORMAL HIGH (ref 70–99)
Potassium: 4.2 mEq/L (ref 3.5–5.1)
Sodium: 141 mEq/L (ref 135–145)
Total Bilirubin: 0.7 mg/dL (ref 0.2–1.2)
Total Protein: 7.4 g/dL (ref 6.0–8.3)

## 2023-07-31 LAB — VITAMIN D 25 HYDROXY (VIT D DEFICIENCY, FRACTURES): VITD: 24.57 ng/mL — ABNORMAL LOW (ref 30.00–100.00)

## 2023-07-31 LAB — TSH: TSH: 3.99 u[IU]/mL (ref 0.35–5.50)

## 2023-07-31 LAB — HEMOGLOBIN A1C: Hgb A1c MFr Bld: 6.1 % (ref 4.6–6.5)

## 2023-08-01 ENCOUNTER — Encounter: Payer: Medicare PPO | Admitting: Family Medicine

## 2023-08-07 ENCOUNTER — Encounter: Payer: Medicare PPO | Admitting: Family Medicine

## 2023-08-07 NOTE — Progress Notes (Deleted)
Subjective:    Patient ID: Kathryn Weaver, female    DOB: [birth year], 77 y.o.   MRN: [id number]  HPI  Here for health maintenance exam and to review chronic medical problems   Wt Readings from Last 3 Encounters:  05/05/23 176 lb 14.4 oz (80.2 kg)  04/18/23 173 lb (78.5 kg)  04/04/23 174 lb (78.9 kg)      There were no vitals filed for this visit.  Immunization History  Administered Date(s) Administered   Fluad Quad(high Dose 65+) 08/31/2019   Influenza,inj,Quad PF,6+ Mos 10/18/2015, 08/28/2016, 02/18/2018   Influenza-Unspecified 09/11/2018   PFIZER Comirnaty(Gray Top)Covid-19 Tri-Sucrose Vaccine 04/30/2021   PFIZER(Purple Top)SARS-COV-2 Vaccination 12/15/2019, 01/07/2020, 08/26/2020   Pfizer Covid-19 Vaccine Bivalent Booster 61yrs & up 09/05/2021   Pneumococcal Conjugate-13 10/18/2015   Pneumococcal Polysaccharide-23 03/01/2014   Td 03/26/1996, 08/09/2008   Zoster Recombinant(Shingrix) 05/22/2022, 11/25/2022    Health Maintenance Due  Topic Date Due   DTaP/Tdap/Td (3 - Tdap) 08/09/2018   INFLUENZA VACCINE  06/12/2023   COVID-19 Vaccine (6 - 2023-24 season) 07/13/2023   Colonoscopy  08/19/2023   Flu shot   Tetanus shot   Mammogram 04/2023  Self breast exam  Gyn health   Colon cancer screening , colonoscopy 08/2018 - 5 y recall due next mo   Bone health  Dexa 11/2022  osteopenia  Falls Fractures Supplements  Last vitamin D Lab Results  Component Value Date   VD25OH 24.57 (L) 07/31/2023    Exercise    Mood    03/17/2023   10:37 AM 05/08/2022    8:04 AM 03/13/2022   10:50 AM 12/12/2021    9:08 AM 10/25/2020    9:06 AM  Depression screen PHQ 2/9  Decreased Interest 0 0 0 0 0  Down, Depressed, Hopeless 0 0 0 0 0  PHQ - 2 Score 0 0 0 0 0  Altered sleeping    3 0  Tired, decreased energy    3 0  Change in appetite    0 0  Feeling bad or failure about yourself     0 0  Trouble concentrating    0 0  Moving slowly or fidgety/restless    0 0  Suicidal  thoughts    0 0  PHQ-9 Score    6 0  Difficult doing work/chores     Not difficult at all       Patient Active Problem List   Diagnosis Date Noted   Iron deficiency anemia 05/05/2023   Fatigue 04/04/2023   Osteopenia 11/20/2022   Primary hyperparathyroidism (HCC) 06/17/2022   ETD (Eustachian tube dysfunction), right 06/17/2022   Hypercalcemia 05/10/2022   Other postherpetic nervous system involvement 12/12/2021   Post herpetic neuralgia 12/12/2021   Cholelithiasis 06/04/2021   Diminished pulses in lower extremity 05/24/2020   Myelodysplastic syndrome, unspecified (HCC) 08/31/2019   Estrogen deficiency 08/31/2019   Diverticulitis 04/27/2018   Constipation 03/18/2018   MGUS (monoclonal gammopathy of unknown significance) 09/17/2017   Prediabetes 10/21/2016   Somnolence, daytime 04/11/2016   Internal hemorrhoid 04/20/2014   Screening mammogram, encounter for 03/01/2014   Uterine prolapse 03/01/2014   Hyperlipidemia 04/28/2012   Routine general medical examination at a health care facility 04/20/2012   Obesity 02/05/2012   Other neutropenia (HCC) 10/23/2010   Thrombocytopenia (HCC) 10/24/2009   Elevated transaminase level 10/24/2009   POSTMENOPAUSAL STATUS 08/09/2008   Depression with anxiety 08/05/2007   Essential hypertension 08/05/2007   GERD 04/20/2007   DIVERTICULAR DISEASE 04/20/2007  History of colonic polyps 04/13/2007   Past Medical History:  Diagnosis Date   Adenomatous polyp of colon 03/2007   Anemia    s/p GI bleed, hospital 5/08   Anxiety    Depression    Diverticulosis    Endometriosis    uterine US 06/2003   GERD (gastroesophageal reflux disease)    Hiatal hernia    EGD 5/08- stricture, HH, gastritis, GERD   History of blood transfusion    Hyperlipidemia    Hypertension    Hypertension    Iron deficiency 03/16/2014   Myelodysplastic disease (HCC)    Past Surgical History:  Procedure Laterality Date   cardiac CT  9/12   normal     CHOLECYSTECTOMY N/A 09/18/2021   Procedure: LAPAROSCOPIC CHOLECYSTECTOMY;  Surgeon: Gaynelle Adu, MD;  Location: [facility] OR;  Service: General;  Laterality: N/A;   HAMMER TOE SURGERY Left 12/02/2017   INTRAOPERATIVE CHOLANGIOGRAM N/A 09/18/2021   Procedure: INTRAOPERATIVE CHOLANGIOGRAM;  Surgeon: Gaynelle Adu, MD;  Location: [facility] OR;  Service: General;  Laterality: N/A;   KNEE SURGERY Left    Cyst behind left knee.   TUBAL LIGATION     Social History   Tobacco Use   Smoking status: Never   Smokeless tobacco: Never  Vaping Use   Vaping status: Never Used  Substance Use Topics   Alcohol use: No    Alcohol/week: 0.0 standard drinks of alcohol   Drug use: No   Family History  Problem Relation Age of Onset   Cancer Father        unknown type   Hypertension Mother    Colon cancer Neg Hx    Esophageal cancer Neg Hx    Rectal cancer Neg Hx    Stomach cancer Neg Hx    No Known Allergies Current Outpatient Medications on File Prior to Visit  Medication Sig Dispense Refill   atorvastatin (LIPITOR) 10 MG tablet TAKE 1 TABLET BY MOUTH EVERY DAY 90 tablet 0   gabapentin (NEURONTIN) 300 MG capsule TAKE 1 CAPSULE BY MOUTH THREE TIMES A DAY 90 capsule 0   hydrochlorothiazide (HYDRODIURIL) 25 MG tablet TAKE 1 TABLET (25 MG TOTAL) BY MOUTH DAILY. 90 tablet 0   sertraline (ZOLOFT) 50 MG tablet TAKE 1 TABLET BY MOUTH EVERY DAY 90 tablet 1   No current facility-administered medications on file prior to visit.    Review of Systems     Objective:   Physical Exam        Assessment & Plan:   Problem List Items Addressed This Visit   None

## 2023-08-08 ENCOUNTER — Encounter: Payer: Self-pay | Admitting: Family Medicine

## 2023-08-28 ENCOUNTER — Inpatient Hospital Stay: Payer: Medicare PPO | Attending: Hematology

## 2023-08-28 DIAGNOSIS — D72819 Decreased white blood cell count, unspecified: Secondary | ICD-10-CM | POA: Insufficient documentation

## 2023-08-28 DIAGNOSIS — D696 Thrombocytopenia, unspecified: Secondary | ICD-10-CM | POA: Insufficient documentation

## 2023-08-28 DIAGNOSIS — E611 Iron deficiency: Secondary | ICD-10-CM | POA: Insufficient documentation

## 2023-08-28 DIAGNOSIS — D508 Other iron deficiency anemias: Secondary | ICD-10-CM

## 2023-08-28 DIAGNOSIS — D472 Monoclonal gammopathy: Secondary | ICD-10-CM | POA: Insufficient documentation

## 2023-08-28 DIAGNOSIS — Z79899 Other long term (current) drug therapy: Secondary | ICD-10-CM | POA: Diagnosis not present

## 2023-08-28 LAB — CBC WITH DIFFERENTIAL/PLATELET
Abs Immature Granulocytes: 0 10*3/uL (ref 0.00–0.07)
Basophils Absolute: 0 10*3/uL (ref 0.0–0.1)
Basophils Relative: 1 %
Eosinophils Absolute: 0.1 10*3/uL (ref 0.0–0.5)
Eosinophils Relative: 3 %
HCT: 44.5 % (ref 36.0–46.0)
Hemoglobin: 14.4 g/dL (ref 12.0–15.0)
Immature Granulocytes: 0 %
Lymphocytes Relative: 48 %
Lymphs Abs: 1.8 10*3/uL (ref 0.7–4.0)
MCH: 32.5 pg (ref 26.0–34.0)
MCHC: 32.4 g/dL (ref 30.0–36.0)
MCV: 100.5 fL — ABNORMAL HIGH (ref 80.0–100.0)
Monocytes Absolute: 0.4 10*3/uL (ref 0.1–1.0)
Monocytes Relative: 12 %
Neutro Abs: 1.3 10*3/uL — ABNORMAL LOW (ref 1.7–7.7)
Neutrophils Relative %: 36 %
Platelets: 108 10*3/uL — ABNORMAL LOW (ref 150–400)
RBC: 4.43 MIL/uL (ref 3.87–5.11)
RDW: 12.1 % (ref 11.5–15.5)
WBC: 3.7 10*3/uL — ABNORMAL LOW (ref 4.0–10.5)
nRBC: 0 % (ref 0.0–0.2)

## 2023-08-28 LAB — FERRITIN: Ferritin: 151 ng/mL (ref 11–307)

## 2023-08-28 LAB — IRON AND TIBC
Iron: 93 ug/dL (ref 28–170)
Saturation Ratios: 29 % (ref 10.4–31.8)
TIBC: 324 ug/dL (ref 250–450)
UIBC: 231 ug/dL

## 2023-08-28 LAB — LACTATE DEHYDROGENASE: LDH: 122 U/L (ref 98–192)

## 2023-08-29 LAB — KAPPA/LAMBDA LIGHT CHAINS
Kappa free light chain: 17.7 mg/L (ref 3.3–19.4)
Kappa, lambda light chain ratio: 0.58 (ref 0.26–1.65)
Lambda free light chains: 30.7 mg/L — ABNORMAL HIGH (ref 5.7–26.3)

## 2023-09-01 LAB — PROTEIN ELECTROPHORESIS, SERUM
A/G Ratio: 0.8 (ref 0.7–1.7)
Albumin ELP: 3.4 g/dL (ref 2.9–4.4)
Alpha-1-Globulin: 0.3 g/dL (ref 0.0–0.4)
Alpha-2-Globulin: 0.8 g/dL (ref 0.4–1.0)
Beta Globulin: 0.9 g/dL (ref 0.7–1.3)
Gamma Globulin: 2.1 g/dL — ABNORMAL HIGH (ref 0.4–1.8)
Globulin, Total: 4.1 g/dL — ABNORMAL HIGH (ref 2.2–3.9)
M-Spike, %: 1.1 g/dL — ABNORMAL HIGH
Total Protein ELP: 7.5 g/dL (ref 6.0–8.5)

## 2023-09-02 LAB — VISCOSITY, SERUM: Viscosity, Serum: 2 rel.saline (ref 1.4–2.1)

## 2023-09-04 ENCOUNTER — Inpatient Hospital Stay: Payer: Medicare PPO | Admitting: Hematology

## 2023-09-04 VITALS — BP 116/88 | HR 75 | Temp 97.9°F | Resp 18 | Ht 63.0 in | Wt 169.0 lb

## 2023-09-04 DIAGNOSIS — D72819 Decreased white blood cell count, unspecified: Secondary | ICD-10-CM | POA: Diagnosis not present

## 2023-09-04 DIAGNOSIS — E611 Iron deficiency: Secondary | ICD-10-CM | POA: Diagnosis not present

## 2023-09-04 DIAGNOSIS — D696 Thrombocytopenia, unspecified: Secondary | ICD-10-CM | POA: Diagnosis not present

## 2023-09-04 DIAGNOSIS — Z79899 Other long term (current) drug therapy: Secondary | ICD-10-CM | POA: Diagnosis not present

## 2023-09-04 DIAGNOSIS — D472 Monoclonal gammopathy: Secondary | ICD-10-CM

## 2023-09-04 NOTE — Patient Instructions (Signed)
[facility] Discharge Instructions   You were seen and examined today by Dr. Ellin Saba.  He reviewed the results of your lab work which are normal/stable. Your hemoglobin improved to normal after receiving iron infusions. Your iron levels are normal. Your Vitamin D is low. You should start taking vitamin D 400 units daily.   We will see you back in 4 months. We will repeat lab work one week prior to your visit.   Return as scheduled.    Thank you for choosing [facility] at [facility] to provide your oncology and hematology care.  To afford each patient quality time with our provider, please arrive at least 15 minutes before your scheduled appointment time.   If you have a lab appointment with the Cancer Center please come in thru the Main Entrance and check in at the main information desk.  You need to re-schedule your appointment should you arrive 10 or more minutes late.  We strive to give you quality time with our providers, and arriving late affects you and other patients whose appointments are after yours.  Also, if you no show three or more times for appointments you may be dismissed from the clinic at the providers discretion.     Again, thank you for choosing [facility].  Our hope is that these requests will decrease the amount of time that you wait before being seen by our physicians.       _____________________________________________________________  Should you have questions after your visit to [facility], please contact our office at [phone] and follow the prompts.  Our office hours are 8:00 a.m. and 4:30 p.m. Monday - Friday.  Please note that voicemails left after 4:00 p.m. may not be returned until the following business day.  We are closed weekends and major holidays.  You do have access to a nurse 24-7, just call the main number to the clinic [phone] and do not press any  options, hold on the line and a nurse will answer the phone.    For prescription refill requests, have your pharmacy contact our office and allow 72 hours.    Due to Covid, you will need to wear a mask upon entering the hospital. If you do not have a mask, a mask will be given to you at the Main Entrance upon arrival. For doctor visits, patients may have 1 support person age 59 or older with them. For treatment visits, patients can not have anyone with them due to social distancing guidelines and our immunocompromised population.

## 2023-09-04 NOTE — Progress Notes (Signed)
[facility] [address] [place] [address]    Clinic Day:  09/04/2023  Referring physician: Tower, Audrie Gallus, MD  Patient Care Team: Tower, Audrie Gallus, MD as PCP - General Wendall Stade, MD as PCP - Cardiology (Cardiology) Dorisann Frames, MD as Referring Physician (Endocrinology) Doreatha Massed, MD as Medical Oncologist (Medical Oncology)   ASSESSMENT & PLAN:   Assessment: 1.  Mild leukopenia and thrombocytopenia: - Patient seen at the request of Dr. Milinda Antis - She has intermittent mild thrombocytopenia since 2010.  Intermittent mild leukopenia since 2009. - No recurrent infections.  No tingling or numbness in extremities.  No headaches or vision changes.  No B symptoms. - She reports worsening tiredness in the last 6 months.   2.  IgM lambda monoclonal gammopathy: - She was previously found to have 1.7 g of IgM lambda monoclonal protein. - BMBX (05/26/2017): Hypercellular bone marrow for age with trilineage hematopoiesis.  3% monoclonal plasma cells were seen.  Lymphocytic component is slightly increased in number at 23% and mostly consisting of small lymphoid cells.  Flow cytometry shows predominance of T lymphocytes with no evident phenotype.   3.  Social/family history: - Lives at home with her husband and is independent of ADLs and IADLs.  She keeps her granddaughters after school.  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  Non-smoker.  No chemical exposure. - No family history of malignancies.    Plan: 1.  Mild leukopenia and thrombocytopenia: - She has intermittent mild leukopenia and thrombocytopenia for several years.  They have been stable..   2.  IgM lambda monoclonal gammopathy: - Labs from 08/28/2023: M spike stable at 1.1 g.  Viscosity is 2.0 and LDH normal.  Light chain ratio 0.58.  Lambda light chains are slightly elevated at 30.7.  Recent calcium was 10.7 but she has high calcium levels and was worked up for it.   Creatinine was normal. - Differential diagnosis includes lymphoplasmacytic lymphoma. - Recommend follow-up in 4 months with repeat labs.  3.  Low ferritin levels: - Received Feraheme on 05/13/2023 on 05/26/2023. - She did not see any significant improvement in her energy levels.  Ferritin is 151, up from 14.  Hemoglobin is 14.4, up from 12.7.    Orders Placed This Encounter  Procedures   CBC with Differential    Standing Status:   Future    Standing Expiration Date:   09/03/2024   Comprehensive metabolic panel    Standing Status:   Future    Standing Expiration Date:   09/03/2024   Lactate dehydrogenase    Standing Status:   Future    Standing Expiration Date:   09/03/2024   Iron and TIBC ([facility] ONLY)    Standing Status:   Future    Standing Expiration Date:   09/03/2024   Kappa/lambda light chains    Standing Status:   Future    Standing Expiration Date:   09/03/2024   Protein electrophoresis, serum    Standing Status:   Future    Standing Expiration Date:   09/03/2024   Viscosity, Serum    Standing Status:   Future    Standing Expiration Date:   09/03/2024      Alben Deeds Teague,acting as a scribe for Doreatha Massed, MD.,have documented all relevant documentation on the behalf of Doreatha Massed, MD,as directed by  Doreatha Massed, MD while in the presence of Doreatha Massed, MD.  I, Doreatha Massed MD, have  reviewed the above documentation for accuracy and completeness, and I agree with the above.    Doreatha Massed, MD   10/24/20246:25 PM  CHIEF COMPLAINT:   Diagnosis: Leukopenia, thrombocytopenia and MGUS    Cancer Staging  No matching staging information was found for the patient.    Prior Therapy: None  Current Therapy: Observation   HISTORY OF PRESENT ILLNESS:   Oncology History   No history exists.     INTERVAL HISTORY:   Kathryn Weaver is a 77 y.o. female presenting to clinic today for follow up of  Leukopenia, thrombocytopenia and MGUS. She was last seen by me on 05/05/23.  Today, she states that she is doing well overall. Her appetite level is at 100%. Her energy level is at 0%. She denies any fever, night sweats, infections, or unexpected weight loss. She reports consistently low energy levels that did not improve with IV iron after her last visit. She is not taking Vitamin D supplements but will start today. She has a history of elevated Calcium levels.   PAST MEDICAL HISTORY:   Past Medical History: Past Medical History:  Diagnosis Date   Adenomatous polyp of colon 03/2007   Anemia    s/p GI bleed, hospital 5/08   Anxiety    Depression    Diverticulosis    Endometriosis    uterine US 06/2003   GERD (gastroesophageal reflux disease)    Hiatal hernia    EGD 5/08- stricture, HH, gastritis, GERD   History of blood transfusion    Hyperlipidemia    Hypertension    Hypertension    Iron deficiency 03/16/2014   Myelodysplastic disease (HCC)     Surgical History: Past Surgical History:  Procedure Laterality Date   cardiac CT  9/12   normal    CHOLECYSTECTOMY N/A 09/18/2021   Procedure: LAPAROSCOPIC CHOLECYSTECTOMY;  Surgeon: Gaynelle Adu, MD;  Location: [facility] OR;  Service: General;  Laterality: N/A;   HAMMER TOE SURGERY Left 12/02/2017   INTRAOPERATIVE CHOLANGIOGRAM N/A 09/18/2021   Procedure: INTRAOPERATIVE CHOLANGIOGRAM;  Surgeon: Gaynelle Adu, MD;  Location: [facility] OR;  Service: General;  Laterality: N/A;   KNEE SURGERY Left    Cyst behind left knee.   TUBAL LIGATION      Social History: Social History   Socioeconomic History   Marital status: Married    Spouse name: Not on file   Number of children: Not on file   Years of education: Not on file   Highest education level: Not on file  Occupational History   Occupation: [employer]  Tobacco Use   Smoking status: Never   Smokeless tobacco: Never  Vaping Use   Vaping status: Never Used  Substance and Sexual  Activity   Alcohol use: No    Alcohol/week: 0.0 standard drinks of alcohol   Drug use: No   Sexual activity: Yes  Other Topics Concern   Not on file  Social History Narrative   Not on file   Social Determinants of Health   Financial Resource Strain: Low Risk  (03/17/2023)   Overall Financial Resource Strain (CARDIA)    Difficulty of Paying Living Expenses: Not hard at all  Food Insecurity: No Food Insecurity (03/17/2023)   Hunger Vital Sign    Worried About Running Out of Food in the Last Year: Never true    Ran Out of Food in the Last Year: Never true  Transportation Needs: No Transportation Needs (03/17/2023)   PRAPARE - Transportation    Lack of Transportation (  Medical): No    Lack of Transportation (Non-Medical): No  Physical Activity: Insufficiently Active (03/17/2023)   Exercise Vital Sign    Days of Exercise per Week: 3 days    Minutes of Exercise per Session: 30 min  Stress: No Stress Concern Present (03/17/2023)   [employer] of Occupational Health - Occupational Stress Questionnaire    Feeling of Stress : Not at all  Social Connections: Moderately Integrated (03/17/2023)   Social Connection and Isolation Panel [NHANES]    Frequency of Communication with Friends and Family: More than three times a week    Frequency of Social Gatherings with Friends and Family: More than three times a week    Attends Religious Services: More than 4 times per year    Active Member of [employer] or Organizations: No    Attends Banker Meetings: Never    Marital Status: Married  Catering manager Violence: Not At Risk (03/17/2023)   Humiliation, Afraid, Rape, and Kick questionnaire    Fear of Current or Ex-Partner: No    Emotionally Abused: No    Physically Abused: No    Sexually Abused: No    Family History: Family History  Problem Relation Age of Onset   Cancer Father        unknown type   Hypertension Mother    Colon cancer Neg Hx    Esophageal cancer Neg Hx    Rectal  cancer Neg Hx    Stomach cancer Neg Hx     Current Medications:  Current Outpatient Medications:    atorvastatin (LIPITOR) 10 MG tablet, TAKE 1 TABLET BY MOUTH EVERY DAY, Disp: 90 tablet, Rfl: 0   gabapentin (NEURONTIN) 300 MG capsule, TAKE 1 CAPSULE BY MOUTH THREE TIMES A DAY, Disp: 90 capsule, Rfl: 0   hydrochlorothiazide (HYDRODIURIL) 25 MG tablet, TAKE 1 TABLET (25 MG TOTAL) BY MOUTH DAILY., Disp: 90 tablet, Rfl: 0   sertraline (ZOLOFT) 50 MG tablet, TAKE 1 TABLET BY MOUTH EVERY DAY, Disp: 90 tablet, Rfl: 1   Allergies: No Known Allergies  REVIEW OF SYSTEMS:   Review of Systems  Constitutional:  Negative for chills, fatigue and fever.  HENT:   Negative for lump/mass, mouth sores, nosebleeds, sore throat and trouble swallowing.   Eyes:  Negative for eye problems.  Respiratory:  Negative for cough and shortness of breath.   Cardiovascular:  Negative for chest pain, leg swelling and palpitations.  Gastrointestinal:  Positive for constipation. Negative for abdominal pain, diarrhea, nausea and vomiting.  Genitourinary:  Negative for bladder incontinence, difficulty urinating, dysuria, frequency, hematuria and nocturia.   Musculoskeletal:  Negative for arthralgias, back pain, flank pain, myalgias and neck pain.  Skin:  Negative for itching and rash.  Neurological:  Negative for dizziness, headaches and numbness.  Hematological:  Does not bruise/bleed easily.  Psychiatric/Behavioral:  Negative for depression, sleep disturbance and suicidal ideas. The patient is not nervous/anxious.   All other systems reviewed and are negative.    VITALS:   Blood pressure 116/88, pulse 75, temperature 97.9 F (36.6 C), temperature source Oral, resp. rate 18, height 5\' 3"  (1.6 m), weight 169 lb (76.7 kg), SpO2 99%.  Wt Readings from Last 3 Encounters:  09/04/23 169 lb (76.7 kg)  05/05/23 176 lb 14.4 oz (80.2 kg)  04/18/23 173 lb (78.5 kg)    Body mass index is 29.94 kg/m.  Performance status  (ECOG): 1 - Symptomatic but completely ambulatory  PHYSICAL EXAM:   Physical Exam Vitals and nursing note reviewed.  Exam conducted with a chaperone present.  Constitutional:      Appearance: Normal appearance.  Cardiovascular:     Rate and Rhythm: Normal rate and regular rhythm.     Pulses: Normal pulses.     Heart sounds: Normal heart sounds.  Pulmonary:     Effort: Pulmonary effort is normal.     Breath sounds: Normal breath sounds.  Abdominal:     Palpations: Abdomen is soft. There is no hepatomegaly, splenomegaly or mass.     Tenderness: There is no abdominal tenderness.  Musculoskeletal:     Right lower leg: No edema.     Left lower leg: No edema.  Lymphadenopathy:     Cervical: No cervical adenopathy.     Right cervical: No superficial, deep or posterior cervical adenopathy.    Left cervical: No superficial, deep or posterior cervical adenopathy.     Upper Body:     Right upper body: No supraclavicular or axillary adenopathy.     Left upper body: No supraclavicular or axillary adenopathy.  Neurological:     General: No focal deficit present.     Mental Status: She is alert and oriented to person, place, and time.  Psychiatric:        Mood and Affect: Mood normal.        Behavior: Behavior normal.     LABS:      Latest Ref Rng & Units 08/28/2023    9:08 AM 07/31/2023    8:18 AM 04/18/2023    9:02 AM  CBC  WBC 4.0 - 10.5 K/uL 3.7  3.4  3.7   Hemoglobin 12.0 - 15.0 g/dL 16.1  09.6  04.5   Hematocrit 36.0 - 46.0 % 44.5  41.5  40.0   Platelets 150 - 400 K/uL 108  74.0 Repeated and verified X2.  119       Latest Ref Rng & Units 07/31/2023    8:18 AM 04/04/2023    8:33 AM 06/10/2022    8:03 AM  CMP  Glucose 70 - 99 mg/dL 409  95    BUN 6 - 23 mg/dL 13  17    Creatinine 8.11 - 1.20 mg/dL 9.14  7.82    Sodium 956 - 145 mEq/L 141  142    Potassium 3.5 - 5.1 mEq/L 4.2  3.9    Chloride 96 - 112 mEq/L 102  104    CO2 19 - 32 mEq/L 33  31    Calcium 8.4 - 10.5 mg/dL  21.3  08.6    57.8  46.9   Total Protein 6.0 - 8.3 g/dL 7.4  7.1  8.1   Total Bilirubin 0.2 - 1.2 mg/dL 0.7  0.5  0.7   Alkaline Phos 39 - 117 U/L 80  70  101   AST 0 - 37 U/L 17  19  33   ALT 0 - 35 U/L 16  16  35      No results found for: "CEA1", "CEA" / No results found for: "CEA1", "CEA" No results found for: "PSA1" No results found for: "CAN199" No results found for: "CAN125"  Lab Results  Component Value Date   TOTALPROTELP 7.5 08/28/2023   ALBUMINELP 3.4 08/28/2023   A1GS 0.3 08/28/2023   A2GS 0.8 08/28/2023   BETS 0.9 08/28/2023   GAMS 2.1 (H) 08/28/2023   MSPIKE 1.1 (H) 08/28/2023   SPEI Comment 08/28/2023   Lab Results  Component Value Date   TIBC 324 08/28/2023   TIBC  404 04/18/2023   TIBC 309 04/21/2017   FERRITIN 151 08/28/2023   FERRITIN 14 04/18/2023   FERRITIN 48 01/04/2019   IRONPCTSAT 29 08/28/2023   IRONPCTSAT 32 (H) 04/18/2023   IRONPCTSAT 18 04/21/2017   Lab Results  Component Value Date   LDH 122 08/28/2023   LDH 118 04/18/2023   LDH 97 (L) 01/04/2019     STUDIES:   No results found.

## 2023-09-16 NOTE — Progress Notes (Unsigned)
Subjective:    Patient ID: Kathryn Weaver, female    DOB: 1945/12/20, 77 y.o.   MRN: [id number]  HPI  Here for health maintenance exam and to review chronic medical problems   Wt Readings from Last 3 Encounters:  09/17/23 171 lb 4 oz (77.7 kg)  09/04/23 169 lb (76.7 kg)  05/05/23 176 lb 14.4 oz (80.2 kg)   31.07 kg/m  Vitals:   09/17/23 0916  BP: 126/70  Pulse: (!) 50  Temp: 97.9 F (36.6 C)  SpO2: 98%    Immunization History  Administered Date(s) Administered   Fluad Quad(high Dose 65+) 08/31/2019   Influenza,inj,Quad PF,6+ Mos 10/18/2015, 08/28/2016, 02/18/2018   Influenza-Unspecified 09/11/2018   PFIZER Comirnaty(Gray Top)Covid-19 Tri-Sucrose Vaccine 04/30/2021   PFIZER(Purple Top)SARS-COV-2 Vaccination 12/15/2019, 01/07/2020, 08/26/2020   Pfizer Covid-19 Vaccine Bivalent Booster 70yrs & up 09/05/2021   Pneumococcal Conjugate-13 10/18/2015   Pneumococcal Polysaccharide-23 03/01/2014   Td 03/26/1996, 08/09/2008   Zoster Recombinant(Shingrix) 05/22/2022, 11/25/2022    Health Maintenance Due  Topic Date Due   Colonoscopy  08/19/2023   Tetanus shot -will get at pharmacy   Flu shot -declines    Mammogram 04/2023  Self breast exam- no lumps   Gyn health-no problems     Colon cancer screening  Colonoscopy 08/2018 - polyps  5 y recall if able , she dclines   Bone health  Dexa  11/2022  osteopenia  Falls-none  Fractures-none  Supplements -vitamin D3, just started when level was low  Last vitamin D Lab Results  Component Value Date   VD25OH 24.57 (L) 07/31/2023    Exercise :  Walking 3 days per week  No strength training      Mood    09/17/2023    9:20 AM 03/17/2023   10:37 AM 05/08/2022    8:04 AM 03/13/2022   10:50 AM 12/12/2021    9:08 AM  Depression screen PHQ 2/9  Decreased Interest 3 0 0 0 0  Down, Depressed, Hopeless 0 0 0 0 0  PHQ - 2 Score 3 0 0 0 0  Altered sleeping 3    3  Tired, decreased energy 3    3  Change in appetite 1    0   Feeling bad or failure about yourself  0    0  Trouble concentrating 0    0  Moving slowly or fidgety/restless 0    0  Suicidal thoughts 0    0  PHQ-9 Score 10    6  Difficult doing work/chores Not difficult at all       Depression /anxiety  Thinks she is doing ok  Zoloft 50 mg   HTN bp is stable today  No cp or palpitations or headaches or edema  No side effects to medicines  BP Readings from Last 3 Encounters:  09/17/23 126/70  09/04/23 116/88  05/26/23 (!) 148/66    Hydrochlorothiazide 25 mg daily in past (this was changed to amlodipine by her endo for hyperparathyroidism) Amlodipine caused swelling  She went back to hydrochlorothiazide     Lab Results  Component Value Date   NA 141 07/31/2023   K 4.2 07/31/2023   CO2 33 (H) 07/31/2023   GLUCOSE 107 (H) 07/31/2023   BUN 13 07/31/2023   CREATININE 0.86 07/31/2023   CALCIUM 10.7 (H) 07/31/2023   GFR 65.73 07/31/2023   GFRNONAA >60 09/18/2021    History of hyperparathyroid Lab Results  Component Value Date   PTH 54 04/04/2023  CALCIUM 10.7 (H) 07/31/2023  Saw Dr Talmage Nap Stopped her hydrochlorothiazide   Sees hematology for myelodysplastic syndrome and MGUS  Lab Results  Component Value Date   WBC 3.7 (L) 08/28/2023   HGB 14.4 08/28/2023   HCT 44.5 08/28/2023   MCV 100.5 (H) 08/28/2023   PLT 108 (L) 08/28/2023  Watching close  Had 2 iron infusions  Made her feel a little better   Hyperlipidemia Lab Results  Component Value Date   CHOL 152 07/31/2023   CHOL 163 05/08/2022   CHOL 156 10/25/2020   Lab Results  Component Value Date   HDL 61.10 07/31/2023   HDL 63.10 05/08/2022   HDL 61.40 10/25/2020   Lab Results  Component Value Date   LDLCALC 81 07/31/2023   LDLCALC 86 05/08/2022   LDLCALC 85 10/25/2020   Lab Results  Component Value Date   TRIG 48.0 07/31/2023   TRIG 70.0 05/08/2022   TRIG 47.0 10/25/2020   Lab Results  Component Value Date   CHOLHDL 2 07/31/2023   CHOLHDL 3  05/08/2022   CHOLHDL 3 10/25/2020   Lab Results  Component Value Date   LDLDIRECT 156.0 04/21/2012   LDLDIRECT 138.9 10/22/2010   LDLDIRECT 163.0 10/20/2009   Atorvastatin 10 mg daily   Prediabetes Lab Results  Component Value Date   HGBA1C 6.1 07/31/2023   No changes      Patient Active Problem List   Diagnosis Date Noted   Iron deficiency anemia 05/05/2023   Fatigue 04/04/2023   Osteopenia 11/20/2022   Primary hyperparathyroidism (HCC) 06/17/2022   Hypercalcemia 05/10/2022   Other postherpetic nervous system involvement 12/12/2021   Post herpetic neuralgia 12/12/2021   Cholelithiasis 06/04/2021   Diminished pulses in lower extremity 05/24/2020   Myelodysplastic syndrome, unspecified (HCC) 08/31/2019   Estrogen deficiency 08/31/2019   MGUS (monoclonal gammopathy of unknown significance) 09/17/2017   Prediabetes 10/21/2016   Internal hemorrhoid 04/20/2014   Screening mammogram, encounter for 03/01/2014   Uterine prolapse 03/01/2014   Hyperlipidemia 04/28/2012   Routine general medical examination at a health care facility 04/20/2012   Obesity 02/05/2012   Other neutropenia (HCC) 10/23/2010   Thrombocytopenia (HCC) 10/24/2009   POSTMENOPAUSAL STATUS 08/09/2008   Depression with anxiety 08/05/2007   Essential hypertension 08/05/2007   GERD 04/20/2007   Diverticulosis of colon 04/20/2007   History of colonic polyps 04/13/2007   Past Medical History:  Diagnosis Date   Adenomatous polyp of colon 03/2007   Anemia    s/p GI bleed, hospital 5/08   Anxiety    Depression    Diverticulosis    Endometriosis    uterine US 06/2003   GERD (gastroesophageal reflux disease)    Hiatal hernia    EGD 5/08- stricture, HH, gastritis, GERD   History of blood transfusion    Hyperlipidemia    Hypertension    Hypertension    Iron deficiency 03/16/2014   Myelodysplastic disease (HCC)    Past Surgical History:  Procedure Laterality Date   cardiac CT  9/12   normal     CHOLECYSTECTOMY N/A 09/18/2021   Procedure: LAPAROSCOPIC CHOLECYSTECTOMY;  Surgeon: Gaynelle Adu, MD;  Location: [facility] OR;  Service: General;  Laterality: N/A;   HAMMER TOE SURGERY Left 12/02/2017   INTRAOPERATIVE CHOLANGIOGRAM N/A 09/18/2021   Procedure: INTRAOPERATIVE CHOLANGIOGRAM;  Surgeon: Gaynelle Adu, MD;  Location: [facility] OR;  Service: General;  Laterality: N/A;   KNEE SURGERY Left    Cyst behind left knee.   TUBAL LIGATION     Social  History   Tobacco Use   Smoking status: Never   Smokeless tobacco: Never  Vaping Use   Vaping status: Never Used  Substance Use Topics   Alcohol use: No    Alcohol/week: 0.0 standard drinks of alcohol   Drug use: No   Family History  Problem Relation Age of Onset   Cancer Father        unknown type   Hypertension Mother    Colon cancer Neg Hx    Esophageal cancer Neg Hx    Rectal cancer Neg Hx    Stomach cancer Neg Hx    Allergies  Allergen Reactions   Amlodipine     Pedal edema    Current Outpatient Medications on File Prior to Visit  Medication Sig Dispense Refill   atorvastatin (LIPITOR) 10 MG tablet TAKE 1 TABLET BY MOUTH EVERY DAY 90 tablet 0   Cholecalciferol (VITAMIN D3) 25 MCG (1000 UT) CAPS Take 2 capsules by mouth daily.     gabapentin (NEURONTIN) 300 MG capsule TAKE 1 CAPSULE BY MOUTH THREE TIMES A DAY 90 capsule 0   sertraline (ZOLOFT) 50 MG tablet TAKE 1 TABLET BY MOUTH EVERY DAY 90 tablet 1   No current facility-administered medications on file prior to visit.    Review of Systems  Constitutional:  Negative for activity change, appetite change, fatigue, fever and unexpected weight change.       Less fatigued than she was   HENT:  Negative for congestion, ear pain, rhinorrhea, sinus pressure and sore throat.   Eyes:  Negative for pain, redness and visual disturbance.  Respiratory:  Negative for cough, shortness of breath and wheezing.   Cardiovascular:  Negative for chest pain and palpitations.  Gastrointestinal:   Negative for abdominal pain, blood in stool, constipation and diarrhea.  Endocrine: Negative for polydipsia and polyuria.  Genitourinary:  Negative for dysuria, frequency and urgency.  Musculoskeletal:  Negative for arthralgias, back pain and myalgias.  Skin:  Negative for pallor and rash.  Allergic/Immunologic: Negative for environmental allergies.  Neurological:  Negative for dizziness, syncope and headaches.  Hematological:  Negative for adenopathy. Does not bruise/bleed easily.  Psychiatric/Behavioral:  Negative for decreased concentration and dysphoric mood. The patient is not nervous/anxious.        Objective:   Physical Exam Constitutional:      General: She is not in acute distress.    Appearance: Normal appearance. She is well-developed. She is obese. She is not ill-appearing or diaphoretic.  HENT:     Head: Normocephalic and atraumatic.     Right Ear: Tympanic membrane, ear canal and external ear normal.     Left Ear: Tympanic membrane, ear canal and external ear normal.     Nose: Nose normal. No congestion.     Mouth/Throat:     Mouth: Mucous membranes are moist.     Pharynx: Oropharynx is clear. No posterior oropharyngeal erythema.  Eyes:     General: No scleral icterus.    Extraocular Movements: Extraocular movements intact.     Conjunctiva/sclera: Conjunctivae normal.     Pupils: Pupils are equal, round, and reactive to light.  Neck:     Thyroid: No thyromegaly.     Vascular: No carotid bruit or JVD.  Cardiovascular:     Rate and Rhythm: Normal rate and regular rhythm.     Pulses: Normal pulses.     Heart sounds: Normal heart sounds.     No gallop.  Pulmonary:     Effort: Pulmonary  effort is normal. No respiratory distress.     Breath sounds: Normal breath sounds. No wheezing.     Comments: Good air exch Chest:     Chest wall: No tenderness.  Abdominal:     General: Bowel sounds are normal. There is no distension or abdominal bruit.     Palpations: Abdomen  is soft. There is no mass.     Tenderness: There is no abdominal tenderness.     Hernia: No hernia is present.  Genitourinary:    Comments: Breast exam: No mass, nodules, thickening, tenderness, bulging, retraction, inflamation, nipple discharge or skin changes noted.  No axillary or clavicular LA.     Musculoskeletal:        General: No tenderness. Normal range of motion.     Cervical back: Normal range of motion and neck supple. No rigidity. No muscular tenderness.     Right lower leg: No edema.     Left lower leg: No edema.     Comments: No kyphosis   Lymphadenopathy:     Cervical: No cervical adenopathy.  Skin:    General: Skin is warm and dry.     Coloration: Skin is not pale.     Findings: No erythema or rash.     Comments: Some scattered skin tags and lentigines   Neurological:     Mental Status: She is alert. Mental status is at baseline.     Cranial Nerves: No cranial nerve deficit.     Motor: No abnormal muscle tone.     Coordination: Coordination normal.     Gait: Gait normal.     Deep Tendon Reflexes: Reflexes are normal and symmetric. Reflexes normal.  Psychiatric:        Mood and Affect: Mood normal.        Cognition and Memory: Cognition and memory normal.           Assessment & Plan:   Problem List Items Addressed This Visit       Cardiovascular and Mediastinum   Essential hypertension    bp in fair control at this time  BP Readings from Last 1 Encounters:  09/17/23 126/70   Endocrinology stopped her hydrochlorothiazide due to high ca and started amlodipine Pt did not tolerate (pedal edema) We will change hydrochlorothiazide to losartan 50 mg daily  Instructed to call if side effects or problems  Follow up 1 month for visit and labs  Most recent labs reviewed  Disc lifstyle change with low sodium diet and exercise        Relevant Medications   losartan (COZAAR) 50 MG tablet     Endocrine   Primary hyperparathyroidism [facility])    Reviewed  endocrinology notes from Dr Talmage Nap from 07/2022 Due for 1 y follow up , she will call ot schedule  She was taken off thiazide diuretic but unfortunately pt re started it   Lab Results  Component Value Date   CALCIUM 10.7 (H) 07/31/2023   Encouraged her to increase vit D3 to 2000 international units daily also  Has osteopenia         Musculoskeletal and Integument   Osteopenia    Dexa 11/2022 In setting of hyperparathyroidism and high ca   No falls or fracture  Discussed fall prevention, supplements and exercise for bone density  Will increase d3 to 2000 international units daily           Hematopoietic and Hemostatic   Thrombocytopenia (HCC)    Continues heme follow  up for this / myelodysplastic syndrome and low iron         Other   Depression with anxiety    Continues sertraline 50 mg  Thinks she is doing well despite PHQ of 10   Reviewed stressors/ coping techniques/symptoms/ support sources/ tx options and side effects in detail today       History of colonic polyps    Declines 5 y recall colonoscopy      Hypercalcemia    Lab Results  Component Value Date   CALCIUM 10.7 (H) 07/31/2023   Reviewed endo note and due for follow up   Will change hydrochlorothiazide to losartan  Follow up 1 mo.      Hyperlipidemia    Disc goals for lipids and reasons to control them Rev last labs with pt Rev low sat fat diet in detail Labs ordered  Tolerates atorvastatin 10 mg daily       Relevant Medications   losartan (COZAAR) 50 MG tablet   Iron deficiency anemia    Had 2 iron infusions from [facility]  Feels better       MGUS (monoclonal gammopathy of unknown significance)    Continues heme care      Myelodysplastic syndrome, unspecified (HCC)    Reviewed last hematology note Has had iron infusions  Feels better  Hb 14.4       Obesity    Discussed how this problem influences overall health and the risks it imposes  Reviewed plan for weight loss with lower  calorie diet (via better food choices (lower glycemic and portion control) along with exercise building up to or more than 30 minutes 5 days per week including some aerobic activity and strength training         Other neutropenia (HCC)    Continues heme care  Myelodysplastic syndrome  Last wbc 3.7      Prediabetes    Lab Results  Component Value Date   HGBA1C 6.1 07/31/2023   disc imp of low glycemic diet and wt loss to prevent DM2       Routine general medical examination at a health care facility - Primary    Reviewed health habits including diet and exercise and skin cancer prevention Reviewed appropriate screening tests for age  Also reviewed health mt list, fam hx and immunization status , as well as social and family history   See HPI Labs reviewed and ordered Will get tetanus shot at pharmacy Declines flu shot  Mammog 04/2023  Colonoscopy 08/2018 , due for 5 y recall for polyps but she declines due to age Dexa 11/2022 Discussed fall prevention, supplements and exercise for bone density  PHQ 10- treated with sertraline, pt states she is actually doing quite well despite high score and does not want to add or change anything

## 2023-09-17 ENCOUNTER — Encounter: Payer: Self-pay | Admitting: Family Medicine

## 2023-09-17 ENCOUNTER — Ambulatory Visit (INDEPENDENT_AMBULATORY_CARE_PROVIDER_SITE_OTHER): Payer: Medicare PPO | Admitting: Family Medicine

## 2023-09-17 VITALS — BP 126/70 | HR 50 | Temp 97.9°F | Ht 62.25 in | Wt 171.2 lb

## 2023-09-17 DIAGNOSIS — Z860100 Personal history of colon polyps, unspecified: Secondary | ICD-10-CM

## 2023-09-17 DIAGNOSIS — I10 Essential (primary) hypertension: Secondary | ICD-10-CM | POA: Diagnosis not present

## 2023-09-17 DIAGNOSIS — E7800 Pure hypercholesterolemia, unspecified: Secondary | ICD-10-CM

## 2023-09-17 DIAGNOSIS — Z0000 Encounter for general adult medical examination without abnormal findings: Secondary | ICD-10-CM | POA: Diagnosis not present

## 2023-09-17 DIAGNOSIS — D469 Myelodysplastic syndrome, unspecified: Secondary | ICD-10-CM

## 2023-09-17 DIAGNOSIS — R7303 Prediabetes: Secondary | ICD-10-CM | POA: Diagnosis not present

## 2023-09-17 DIAGNOSIS — D696 Thrombocytopenia, unspecified: Secondary | ICD-10-CM | POA: Diagnosis not present

## 2023-09-17 DIAGNOSIS — F418 Other specified anxiety disorders: Secondary | ICD-10-CM | POA: Diagnosis not present

## 2023-09-17 DIAGNOSIS — D708 Other neutropenia: Secondary | ICD-10-CM | POA: Diagnosis not present

## 2023-09-17 DIAGNOSIS — D508 Other iron deficiency anemias: Secondary | ICD-10-CM

## 2023-09-17 DIAGNOSIS — E210 Primary hyperparathyroidism: Secondary | ICD-10-CM

## 2023-09-17 DIAGNOSIS — D472 Monoclonal gammopathy: Secondary | ICD-10-CM

## 2023-09-17 DIAGNOSIS — Z6830 Body mass index (BMI) 30.0-30.9, adult: Secondary | ICD-10-CM

## 2023-09-17 DIAGNOSIS — E66811 Obesity, class 1: Secondary | ICD-10-CM

## 2023-09-17 DIAGNOSIS — M8580 Other specified disorders of bone density and structure, unspecified site: Secondary | ICD-10-CM

## 2023-09-17 DIAGNOSIS — K219 Gastro-esophageal reflux disease without esophagitis: Secondary | ICD-10-CM

## 2023-09-17 MED ORDER — LOSARTAN POTASSIUM 50 MG PO TABS: 50.0000 mg | ORAL_TABLET | Freq: Every day | ORAL | 1 refills | Status: DC

## 2023-09-17 NOTE — Assessment & Plan Note (Signed)
Continues sertraline 50 mg  Thinks she is doing well despite PHQ of 10   Reviewed stressors/ coping techniques/symptoms/ support sources/ tx options and side effects in detail today

## 2023-09-17 NOTE — Assessment & Plan Note (Signed)
Continues heme care

## 2023-09-17 NOTE — Assessment & Plan Note (Signed)
Dexa 11/2022 In setting of hyperparathyroidism and high ca   No falls or fracture  Discussed fall prevention, supplements and exercise for bone density  Will increase d3 to 2000 international units daily

## 2023-09-17 NOTE — Assessment & Plan Note (Signed)
 Discussed how this problem influences overall health and the risks it imposes  Reviewed plan for weight loss with lower calorie diet (via better food choices (lower glycemic and portion control) along with exercise building up to or more than 30 minutes 5 days per week including some aerobic activity and strength training

## 2023-09-17 NOTE — Assessment & Plan Note (Signed)
Reviewed endocrinology notes from Dr Talmage Nap from 07/2022 Due for 1 y follow up , she will call ot schedule  She was taken off thiazide diuretic but unfortunately pt re started it   Lab Results  Component Value Date   CALCIUM 10.7 (H) 07/31/2023   Encouraged her to increase vit D3 to 2000 international units daily also  Has osteopenia

## 2023-09-17 NOTE — Assessment & Plan Note (Signed)
Lab Results  Component Value Date   HGBA1C 6.1 07/31/2023   disc imp of low glycemic diet and wt loss to prevent DM2

## 2023-09-17 NOTE — Patient Instructions (Addendum)
Update your tetanus shot (Td) at pharmacy   Go up to 2000 international units vitamin D3 daily   Goal - exercise 5 days per week at least 30 minutes  Keep walking Add some strength training to your routine, this is important for bone and brain health and can reduce your risk of falls and help your body use insulin properly and regulate weight  Light weights, exercise bands , and internet videos are a good way to start  Yoga (chair or regular), machines , floor exercises or a gym with machines are also good options   Exercise and socialize to prevent dementia   Stop the hydrochlorothiazide (fluid pill)-it can make calcium higher  Start losartan 50 mg daily  If any side effects or problems let me know  Follow up in 1 month with me for blood pressure / make sure you take your medicine   Get an appointment with Dr Talmage Nap for 1 year follow up of calcium level  To prevent diabetes Try to get most of your carbohydrates from produce (with the exception of white potatoes) and whole grains Eat less bread/pasta/rice/snack foods/cereals/sweets and other items from the middle of the grocery store (processed carbs)

## 2023-09-17 NOTE — Assessment & Plan Note (Signed)
Reviewed last hematology note Has had iron infusions  Feels better  Hb 14.4

## 2023-09-17 NOTE — Assessment & Plan Note (Signed)
Continues heme care  Myelodysplastic syndrome  Last wbc 3.7

## 2023-09-17 NOTE — Assessment & Plan Note (Signed)
Had 2 iron infusions from [facility]  Feels better

## 2023-09-17 NOTE — Assessment & Plan Note (Addendum)
Continues heme follow up for this / myelodysplastic syndrome and low iron

## 2023-09-17 NOTE — Assessment & Plan Note (Addendum)
Lab Results  Component Value Date   CALCIUM 10.7 (H) 07/31/2023   Reviewed endo note and due for follow up   Will change hydrochlorothiazide to losartan  Follow up 1 mo.

## 2023-09-17 NOTE — Assessment & Plan Note (Signed)
bp in fair control at this time  BP Readings from Last 1 Encounters:  09/17/23 126/70   Endocrinology stopped her hydrochlorothiazide due to high ca and started amlodipine Pt did not tolerate (pedal edema) We will change hydrochlorothiazide to losartan 50 mg daily  Instructed to call if side effects or problems  Follow up 1 month for visit and labs  Most recent labs reviewed  Disc lifstyle change with low sodium diet and exercise

## 2023-09-17 NOTE — Assessment & Plan Note (Signed)
Disc goals for lipids and reasons to control them Rev last labs with pt Rev low sat fat diet in detail Labs ordered  Tolerates atorvastatin 10 mg daily

## 2023-09-17 NOTE — Assessment & Plan Note (Signed)
Declines 5 y recall colonoscopy

## 2023-09-17 NOTE — Assessment & Plan Note (Signed)
Reviewed health habits including diet and exercise and skin cancer prevention Reviewed appropriate screening tests for age  Also reviewed health mt list, fam hx and immunization status , as well as social and family history   See HPI Labs reviewed and ordered Will get tetanus shot at pharmacy Declines flu shot  Mammog 04/2023  Colonoscopy 08/2018 , due for 5 y recall for polyps but she declines due to age Dexa 11/2022 Discussed fall prevention, supplements and exercise for bone density  PHQ 10- treated with sertraline, pt states she is actually doing quite well despite high score and does not want to add or change anything

## 2023-09-23 ENCOUNTER — Encounter: Payer: Self-pay | Admitting: Gastroenterology

## 2023-10-02 ENCOUNTER — Other Ambulatory Visit: Payer: Self-pay | Admitting: Family Medicine

## 2023-10-11 ENCOUNTER — Other Ambulatory Visit: Payer: Self-pay | Admitting: Family Medicine

## 2023-10-22 ENCOUNTER — Encounter: Payer: Self-pay | Admitting: Family Medicine

## 2023-10-22 ENCOUNTER — Ambulatory Visit: Payer: Medicare PPO | Admitting: Family Medicine

## 2023-10-22 VITALS — BP 122/70 | HR 52 | Temp 98.1°F | Ht 62.25 in | Wt 171.0 lb

## 2023-10-22 DIAGNOSIS — I10 Essential (primary) hypertension: Secondary | ICD-10-CM

## 2023-10-22 DIAGNOSIS — R7303 Prediabetes: Secondary | ICD-10-CM

## 2023-10-22 DIAGNOSIS — R600 Localized edema: Secondary | ICD-10-CM

## 2023-10-22 DIAGNOSIS — E7800 Pure hypercholesterolemia, unspecified: Secondary | ICD-10-CM

## 2023-10-22 DIAGNOSIS — E210 Primary hyperparathyroidism: Secondary | ICD-10-CM

## 2023-10-22 LAB — BASIC METABOLIC PANEL
BUN: 15 mg/dL (ref 6–23)
CO2: 32 meq/L (ref 19–32)
Calcium: 10.4 mg/dL (ref 8.4–10.5)
Chloride: 101 meq/L (ref 96–112)
Creatinine, Ser: 0.75 mg/dL (ref 0.40–1.20)
GFR: 77.33 mL/min (ref >60.00)
Glucose, Bld: 106 mg/dL — ABNORMAL HIGH (ref 70–99)
Potassium: 3.7 meq/L (ref 3.5–5.1)
Sodium: 138 meq/L (ref 135–145)

## 2023-10-22 NOTE — Progress Notes (Signed)
Subjective:    Patient ID: Kathryn Weaver, female    DOB: March 11, 1946, 77 y.o.   MRN: [id number]  HPI  Wt Readings from Last 3 Encounters:  10/22/23 171 lb (77.6 kg)  09/17/23 171 lb 4 oz (77.7 kg)  09/04/23 169 lb (76.7 kg)   31.03 kg/m  Vitals:   10/22/23 0856 10/22/23 0916  BP: 136/82 122/70  Pulse: (!) 52   Temp: 98.1 F (36.7 C)   SpO2: 99%    Pt presents for follow up of HTN  bp is stable today  No cp or palpitations or headaches or edema  No side effects to medicines  BP Readings from Last 3 Encounters:  10/22/23 122/70  09/17/23 126/70  09/04/23 116/88    Last visit changed hydrochlorothiazide (caused high ca) and started losartan 50 mg daily  Did not tolerate amlodipine 5 mg (edema)   A little swelling in ankles    Lab Results  Component Value Date   NA 141 07/31/2023   K 4.2 07/31/2023   CO2 33 (H) 07/31/2023   GLUCOSE 107 (H) 07/31/2023   BUN 13 07/31/2023   CREATININE 0.86 07/31/2023   CALCIUM 10.7 (H) 07/31/2023   GFR 65.73 07/31/2023   GFRNONAA >60 09/18/2021   Tries to cook at home Tries not to add salt     Patient Active Problem List   Diagnosis Date Noted   Pedal edema 10/22/2023   Iron deficiency anemia 05/05/2023   Fatigue 04/04/2023   Osteopenia 11/20/2022   Primary hyperparathyroidism (HCC) 06/17/2022   Hypercalcemia 05/10/2022   Other postherpetic nervous system involvement 12/12/2021   Post herpetic neuralgia 12/12/2021   Cholelithiasis 06/04/2021   Diminished pulses in lower extremity 05/24/2020   Myelodysplastic syndrome, unspecified (HCC) 08/31/2019   Estrogen deficiency 08/31/2019   MGUS (monoclonal gammopathy of unknown significance) 09/17/2017   Prediabetes 10/21/2016   Internal hemorrhoid 04/20/2014   Screening mammogram, encounter for 03/01/2014   Uterine prolapse 03/01/2014   Hyperlipidemia 04/28/2012   Routine general medical examination at a health care facility 04/20/2012   Obesity 02/05/2012   Other  neutropenia (HCC) 10/23/2010   Thrombocytopenia (HCC) 10/24/2009   POSTMENOPAUSAL STATUS 08/09/2008   Depression with anxiety 08/05/2007   Essential hypertension 08/05/2007   GERD 04/20/2007   Diverticulosis of colon 04/20/2007   History of colonic polyps 04/13/2007   Past Medical History:  Diagnosis Date   Adenomatous polyp of colon 03/2007   Anemia    s/p GI bleed, hospital 5/08   Anxiety    Depression    Diverticulosis    Endometriosis    uterine US 06/2003   GERD (gastroesophageal reflux disease)    Hiatal hernia    EGD 5/08- stricture, HH, gastritis, GERD   History of blood transfusion    Hyperlipidemia    Hypertension    Hypertension    Iron deficiency 03/16/2014   Myelodysplastic disease (HCC)    Past Surgical History:  Procedure Laterality Date   cardiac CT  9/12   normal    CHOLECYSTECTOMY N/A 09/18/2021   Procedure: LAPAROSCOPIC CHOLECYSTECTOMY;  Surgeon: Gaynelle Adu, MD;  [facility];  Service: General;  Laterality: N/A;   HAMMER TOE SURGERY Left 12/02/2017   INTRAOPERATIVE CHOLANGIOGRAM N/A 09/18/2021   Procedure: INTRAOPERATIVE CHOLANGIOGRAM;  Surgeon: Gaynelle Adu, MD;  Location: [facility] OR;  Service: General;  Laterality: N/A;   KNEE SURGERY Left    Cyst behind left knee.   TUBAL LIGATION     Social History  Tobacco Use   Smoking status: Never   Smokeless tobacco: Never  Vaping Use   Vaping status: Never Used  Substance Use Topics   Alcohol use: No    Alcohol/week: 0.0 standard drinks of alcohol   Drug use: No   Family History  Problem Relation Age of Onset   Cancer Father        unknown type   Hypertension Mother    Colon cancer Neg Hx    Esophageal cancer Neg Hx    Rectal cancer Neg Hx    Stomach cancer Neg Hx    Allergies  Allergen Reactions   Amlodipine     Pedal edema    Current Outpatient Medications on File Prior to Visit  Medication Sig Dispense Refill   atorvastatin (LIPITOR) 10 MG tablet TAKE 1 TABLET BY MOUTH EVERY DAY 90  tablet 2   Cholecalciferol (VITAMIN D3) 25 MCG (1000 UT) CAPS Take 2 capsules by mouth daily.     gabapentin (NEURONTIN) 300 MG capsule TAKE 1 CAPSULE BY MOUTH THREE TIMES A DAY 90 capsule 0   losartan (COZAAR) 50 MG tablet Take 1 tablet (50 mg total) by mouth daily. 30 tablet 1   sertraline (ZOLOFT) 50 MG tablet TAKE 1 TABLET BY MOUTH EVERY DAY 90 tablet 1   No current facility-administered medications on file prior to visit.    Review of Systems  Constitutional:  Negative for activity change, appetite change, fatigue, fever and unexpected weight change.  HENT:  Negative for congestion, ear pain, rhinorrhea, sinus pressure and sore throat.   Eyes:  Negative for pain, redness and visual disturbance.  Respiratory:  Negative for cough, shortness of breath and wheezing.   Cardiovascular:  Positive for leg swelling. Negative for chest pain and palpitations.  Gastrointestinal:  Negative for abdominal pain, blood in stool, constipation and diarrhea.  Endocrine: Negative for polydipsia and polyuria.  Genitourinary:  Negative for dysuria, frequency and urgency.  Musculoskeletal:  Negative for arthralgias, back pain and myalgias.  Skin:  Negative for pallor and rash.  Allergic/Immunologic: Negative for environmental allergies.  Neurological:  Negative for dizziness, syncope and headaches.  Hematological:  Negative for adenopathy. Does not bruise/bleed easily.  Psychiatric/Behavioral:  Negative for decreased concentration and dysphoric mood. The patient is not nervous/anxious.        Objective:   Physical Exam Constitutional:      General: She is not in acute distress.    Appearance: Normal appearance. She is well-developed. She is obese. She is not ill-appearing or diaphoretic.  HENT:     Head: Normocephalic and atraumatic.  Eyes:     Conjunctiva/sclera: Conjunctivae normal.     Pupils: Pupils are equal, round, and reactive to light.  Neck:     Thyroid: No thyromegaly.     Vascular: No  carotid bruit or JVD.  Cardiovascular:     Rate and Rhythm: Normal rate and regular rhythm.     Heart sounds: Normal heart sounds.     No gallop.  Pulmonary:     Effort: Pulmonary effort is normal. No respiratory distress.     Breath sounds: Normal breath sounds. No wheezing or rales.  Abdominal:     General: There is no distension or abdominal bruit.     Palpations: Abdomen is soft.  Musculoskeletal:     Cervical back: Normal range of motion and neck supple.     Right lower leg: No edema.     Left lower leg: No edema.  Comments: Ankles are puffy No notable pitting edema today  Lymphadenopathy:     Cervical: No cervical adenopathy.  Skin:    General: Skin is warm and dry.     Coloration: Skin is not pale.     Findings: No rash.  Neurological:     Mental Status: She is alert.     Coordination: Coordination normal.     Deep Tendon Reflexes: Reflexes are normal and symmetric. Reflexes normal.  Psychiatric:        Mood and Affect: Mood normal.           Assessment & Plan:   Problem List Items Addressed This Visit       Cardiovascular and Mediastinum   Essential hypertension - Primary    Improved bp in fair control at this time  BP Readings from Last 1 Encounters:  10/22/23 122/70   No changes needed Most recent labs reviewed  Disc lifstyle change with low sodium diet and exercise  Plan to continue losartan 50 mg daily   Off hydrochlorothiazide due to high ca Off amlodipine due to pedal edema   Lab today       Relevant Orders   Basic metabolic panel     Endocrine   Primary hyperparathyroidism (HCC)    Was instructed to hold hydrochlorothiazide by her endocrinologist  Sees Dr Talmage Nap  Changed it to losartan  Lab today      Relevant Orders   Basic metabolic panel     Other   Pedal edema    Mild  No pitting today  Pt notes a bit worse after stopping hydrochlorothiazide   (stopped for elevated ca)  Encouraged low sodium diet Elevate feet when  able Avoid standing  Supp hose to knee (or higher)   No shortness of breath or cardiac symptoms Will continue to monitor

## 2023-10-22 NOTE — Patient Instructions (Addendum)
Try to avoid processed foods /sodium  Support socks to the knee will help also   Elevate feet when you sit   Try and stay active  Add some strength training to your routine, this is important for bone and brain health and can reduce your risk of falls and help your body use insulin properly and regulate weight  Light weights, exercise bands , and internet videos are a good way to start  Yoga (chair or regular), machines , floor exercises or a gym with machines are also good options    Labs today   Continue current medicines

## 2023-10-22 NOTE — Assessment & Plan Note (Signed)
Mild  No pitting today  Pt notes a bit worse after stopping hydrochlorothiazide   (stopped for elevated ca)  Encouraged low sodium diet Elevate feet when able Avoid standing  Supp hose to knee (or higher)   No shortness of breath or cardiac symptoms Will continue to monitor

## 2023-10-22 NOTE — Assessment & Plan Note (Signed)
Improved bp in fair control at this time  BP Readings from Last 1 Encounters:  10/22/23 122/70   No changes needed Most recent labs reviewed  Disc lifstyle change with low sodium diet and exercise  Plan to continue losartan 50 mg daily   Off hydrochlorothiazide due to high ca Off amlodipine due to pedal edema   Lab today

## 2023-10-22 NOTE — Assessment & Plan Note (Addendum)
Was instructed to hold hydrochlorothiazide by her endocrinologist  Sees Dr Talmage Nap  Changed it to losartan  Lab today

## 2023-10-23 ENCOUNTER — Other Ambulatory Visit: Payer: Self-pay | Admitting: Family Medicine

## 2023-10-23 ENCOUNTER — Encounter: Payer: Self-pay | Admitting: *Deleted

## 2023-10-23 DIAGNOSIS — B0229 Other postherpetic nervous system involvement: Secondary | ICD-10-CM

## 2023-10-23 NOTE — Telephone Encounter (Signed)
Last OV was today, last filled on 07/08/23 #90 caps with 0 refills

## 2023-10-23 NOTE — Telephone Encounter (Signed)
Please verify how much she is taking  I may have to change directions Thanks

## 2023-10-23 NOTE — Telephone Encounter (Signed)
Left message with spouse requesting pt to call the office back

## 2023-10-24 ENCOUNTER — Telehealth: Payer: Self-pay | Admitting: Family Medicine

## 2023-10-24 NOTE — Telephone Encounter (Signed)
Called pt back, she said she still has that nerve pain sometimes since having shingles. Pt said she takes one tablet a day if needed. She said she stopped taking it TID after shingles resolved but now takes 1 tab daily if she gets that nerve pain

## 2023-10-24 NOTE — Telephone Encounter (Signed)
Patient returned call regarding lab results. I relayed message to her from Dr Milinda Antis.She verbalized understanding.

## 2023-10-24 NOTE — Telephone Encounter (Signed)
Letter was mailed with labs, see other phone note I was calling regarding med.

## 2023-10-29 ENCOUNTER — Other Ambulatory Visit: Payer: Self-pay | Admitting: Family Medicine

## 2023-11-11 DIAGNOSIS — B880 Other acariasis: Secondary | ICD-10-CM | POA: Diagnosis not present

## 2023-11-11 DIAGNOSIS — H01024 Squamous blepharitis left upper eyelid: Secondary | ICD-10-CM | POA: Diagnosis not present

## 2023-11-11 DIAGNOSIS — H01021 Squamous blepharitis right upper eyelid: Secondary | ICD-10-CM | POA: Diagnosis not present

## 2023-11-13 DIAGNOSIS — M25561 Pain in right knee: Secondary | ICD-10-CM | POA: Diagnosis not present

## 2023-12-11 DIAGNOSIS — M25561 Pain in right knee: Secondary | ICD-10-CM | POA: Diagnosis not present

## 2023-12-28 ENCOUNTER — Other Ambulatory Visit: Payer: Self-pay | Admitting: Family Medicine

## 2023-12-30 ENCOUNTER — Inpatient Hospital Stay: Payer: Medicare PPO | Attending: Hematology

## 2023-12-30 DIAGNOSIS — D469 Myelodysplastic syndrome, unspecified: Secondary | ICD-10-CM | POA: Insufficient documentation

## 2023-12-30 DIAGNOSIS — D472 Monoclonal gammopathy: Secondary | ICD-10-CM | POA: Insufficient documentation

## 2023-12-30 DIAGNOSIS — D696 Thrombocytopenia, unspecified: Secondary | ICD-10-CM | POA: Diagnosis not present

## 2023-12-30 DIAGNOSIS — Z79899 Other long term (current) drug therapy: Secondary | ICD-10-CM | POA: Diagnosis not present

## 2023-12-30 DIAGNOSIS — D708 Other neutropenia: Secondary | ICD-10-CM | POA: Insufficient documentation

## 2023-12-30 DIAGNOSIS — D72819 Decreased white blood cell count, unspecified: Secondary | ICD-10-CM | POA: Insufficient documentation

## 2023-12-30 LAB — CBC WITH DIFFERENTIAL/PLATELET
Abs Immature Granulocytes: 0 10*3/uL (ref 0.00–0.07)
Basophils Absolute: 0 10*3/uL (ref 0.0–0.1)
Basophils Relative: 1 %
Eosinophils Absolute: 0.1 10*3/uL (ref 0.0–0.5)
Eosinophils Relative: 1 %
HCT: 44.3 % (ref 36.0–46.0)
Hemoglobin: 14.3 g/dL (ref 12.0–15.0)
Immature Granulocytes: 0 %
Lymphocytes Relative: 52 %
Lymphs Abs: 1.8 10*3/uL (ref 0.7–4.0)
MCH: 32.9 pg (ref 26.0–34.0)
MCHC: 32.3 g/dL (ref 30.0–36.0)
MCV: 102.1 fL — ABNORMAL HIGH (ref 80.0–100.0)
Monocytes Absolute: 0.4 10*3/uL (ref 0.1–1.0)
Monocytes Relative: 11 %
Neutro Abs: 1.2 10*3/uL — ABNORMAL LOW (ref 1.7–7.7)
Neutrophils Relative %: 35 %
Platelets: 103 10*3/uL — ABNORMAL LOW (ref 150–400)
RBC: 4.34 MIL/uL (ref 3.87–5.11)
RDW: 11.9 % (ref 11.5–15.5)
WBC: 3.5 10*3/uL — ABNORMAL LOW (ref 4.0–10.5)
nRBC: 0 % (ref 0.0–0.2)

## 2023-12-30 LAB — COMPREHENSIVE METABOLIC PANEL
ALT: 22 U/L (ref 0–44)
AST: 21 U/L (ref 15–41)
Albumin: 3.7 g/dL (ref 3.5–5.0)
Alkaline Phosphatase: 72 U/L (ref 38–126)
Anion gap: 9 (ref 5–15)
BUN: 20 mg/dL (ref 8–23)
CO2: 30 mmol/L (ref 22–32)
Calcium: 10.7 mg/dL — ABNORMAL HIGH (ref 8.9–10.3)
Chloride: 103 mmol/L (ref 98–111)
Creatinine, Ser: 0.91 mg/dL (ref 0.44–1.00)
GFR, Estimated: >60 mL/min (ref >60)
Glucose, Bld: 130 mg/dL — ABNORMAL HIGH (ref 70–99)
Potassium: 3.6 mmol/L (ref 3.5–5.1)
Sodium: 142 mmol/L (ref 135–145)
Total Bilirubin: 0.6 mg/dL (ref 0.0–1.2)
Total Protein: 8.2 g/dL — ABNORMAL HIGH (ref 6.5–8.1)

## 2023-12-30 LAB — IRON AND TIBC
Iron: 104 ug/dL (ref 28–170)
Saturation Ratios: 30 % (ref 10.4–31.8)
TIBC: 344 ug/dL (ref 250–450)
UIBC: 240 ug/dL

## 2023-12-30 LAB — LACTATE DEHYDROGENASE: LDH: 117 U/L (ref 98–192)

## 2023-12-31 LAB — KAPPA/LAMBDA LIGHT CHAINS
Kappa free light chain: 16.4 mg/L (ref 3.3–19.4)
Kappa, lambda light chain ratio: 0.6 (ref 0.26–1.65)
Lambda free light chains: 27.2 mg/L — ABNORMAL HIGH (ref 5.7–26.3)

## 2024-01-01 LAB — VISCOSITY, SERUM: Viscosity, Serum: 2.2 rel.saline — ABNORMAL HIGH (ref 1.4–2.1)

## 2024-01-05 LAB — PROTEIN ELECTROPHORESIS, SERUM
A/G Ratio: 0.8 (ref 0.7–1.7)
Albumin ELP: 3.4 g/dL (ref 2.9–4.4)
Alpha-1-Globulin: 0.3 g/dL (ref 0.0–0.4)
Alpha-2-Globulin: 0.9 g/dL (ref 0.4–1.0)
Beta Globulin: 0.9 g/dL (ref 0.7–1.3)
Gamma Globulin: 2.1 g/dL — ABNORMAL HIGH (ref 0.4–1.8)
Globulin, Total: 4.1 g/dL — ABNORMAL HIGH (ref 2.2–3.9)
M-Spike, %: 0.8 g/dL — ABNORMAL HIGH
Total Protein ELP: 7.5 g/dL (ref 6.0–8.5)

## 2024-01-05 NOTE — Progress Notes (Signed)
 [facility] [address] [place] [address]    Clinic Day:  01/06/2024  Referring physician: Tower, Audrie Gallus, MD  Patient Care Team: Tower, Audrie Gallus, MD as PCP - General Wendall Stade, MD as PCP - Cardiology (Cardiology) Dorisann Frames, MD as Referring Physician (Endocrinology) Doreatha Massed, MD as Medical Oncologist (Medical Oncology)   ASSESSMENT & PLAN:   Assessment: 1.  Mild leukopenia and thrombocytopenia: - Patient seen at the request of Dr. Milinda Antis - She has intermittent mild thrombocytopenia since 2010.  Intermittent mild leukopenia since 2009. - No recurrent infections.  No tingling or numbness in extremities.  No headaches or vision changes.  No B symptoms. - She reports worsening tiredness in the last 6 months.   2.  IgM lambda monoclonal gammopathy: - She was previously found to have 1.7 g of IgM lambda monoclonal protein. - BMBX (05/26/2017): Hypercellular bone marrow for age with trilineage hematopoiesis.  3% monoclonal plasma cells were seen.  Lymphocytic component is slightly increased in number at 23% and mostly consisting of small lymphoid cells.  Flow cytometry shows predominance of T lymphocytes with no evident phenotype.   3.  Social/family history: - Lives at home with her husband and is independent of ADLs and IADLs.  She keeps her granddaughters after school.  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  Non-smoker.  No chemical exposure. - No family history of malignancies.    Plan: 1.  Mild leukopenia and thrombocytopenia: - She has intermittent mild leukopenia and thrombocytopenia for several years.  Latest white count is 3.5 with ANC of 1.2.  Platelet count is 103 and stable.   2.  IgM lambda monoclonal gammopathy: - She denies any infections or new onset pains.  No headaches, vision changes, numbness in the extremities noted. - Labs from 12/30/2023: Creatinine is 0.91.  Mild hypercalcemia on and off is stable.   She takes vitamin D supplements daily.  Serum viscosity is 2.2, slightly up from 2.0 previously.  She does not have any hyperviscosity signs or symptoms.  M spike is 0.8 g.  FLC ratio is normal with lambda light chains 27.2 and stable.  CBC grossly normal. - Will check skeletal survey today.  RTC 4 months for follow-up with repeat labs.  3.  Low ferritin levels: - Last Feraheme on 05/26/2023.  She reports energy levels severely low at 10%. - Hemoglobin is 14.3 and stable.  We checked a ferritin, B12 and folic acid today.  Ferritin was 93, B12 and folic acid normal.  Will give her 1 dose of Feraheme for severe fatigue.    Orders Placed This Encounter  Procedures   CBC with Differential    Standing Status:   Future    Expected Date:   05/11/2024    Expiration Date:   01/05/2025   Comprehensive metabolic panel    Standing Status:   Future    Expected Date:   05/11/2024    Expiration Date:   01/05/2025   Lactate dehydrogenase    Standing Status:   Future    Expected Date:   05/11/2024    Expiration Date:   01/05/2025   Iron and TIBC ([facility] ONLY)    Standing Status:   Future    Expected Date:   05/11/2024    Expiration Date:   01/05/2025   Ferritin    Standing Status:   Future    Expected Date:   05/11/2024    Expiration  Date:   01/05/2025   Kappa/lambda light chains    Standing Status:   Future    Expected Date:   05/11/2024    Expiration Date:   01/05/2025   Protein electrophoresis, serum    Standing Status:   Future    Expected Date:   05/11/2024    Expiration Date:   01/05/2025   Ferritin    Standing Status:   Future    Number of Occurrences:   1    Expected Date:   01/06/2024    Expiration Date:   01/05/2025   Vitamin B12    Standing Status:   Future    Number of Occurrences:   1    Expected Date:   01/06/2024    Expiration Date:   01/05/2025   Folate    Standing Status:   Future    Number of Occurrences:   1    Expected Date:   01/06/2024    Expiration Date:    01/05/2025      Alben Deeds Teague,acting as a scribe for Doreatha Massed, MD.,have documented all relevant documentation on the behalf of Doreatha Massed, MD,as directed by  Doreatha Massed, MD while in the presence of Doreatha Massed, MD.  I, Doreatha Massed MD, have reviewed the above documentation for accuracy and completeness, and I agree with the above.     Doreatha Massed, MD   2/25/20259:02 AM  CHIEF COMPLAINT:   Diagnosis: Leukopenia, thrombocytopenia and MGUS    Cancer Staging  No matching staging information was found for the patient.    Prior Therapy: None  Current Therapy: Observation   HISTORY OF PRESENT ILLNESS:   Oncology History   No history exists.     INTERVAL HISTORY:   Dynastee is a 78 y.o. female presenting to clinic today for follow up of Leukopenia, thrombocytopenia and MGUS. She was last seen by me on 09/04/23.  Today, she states that she is doing well overall. Her appetite level is at 100%. Her energy level is at 10%. Kura denies any new infections, new onset bone pains/pains, fevers, or night sweats. She takes OTC 1,000 units of Vitamin D daily. She denies any visions changes, headaches, or neuropathy. Kaisy reports low energy and is often sleepy. She states she recently was told to stop a medication because it caused her lower extremity fluid retention.  PAST MEDICAL HISTORY:   Past Medical History: Past Medical History:  Diagnosis Date   Adenomatous polyp of colon 03/2007   Anemia    s/p GI bleed, hospital 5/08   Anxiety    Depression    Diverticulosis    Endometriosis    uterine US 06/2003   GERD (gastroesophageal reflux disease)    Hiatal hernia    EGD 5/08- stricture, HH, gastritis, GERD   History of blood transfusion    Hyperlipidemia    Hypertension    Hypertension    Iron deficiency 03/16/2014   Myelodysplastic disease (HCC)     Surgical History: Past Surgical History:  Procedure Laterality Date    cardiac CT  9/12   normal    CHOLECYSTECTOMY N/A 09/18/2021   Procedure: LAPAROSCOPIC CHOLECYSTECTOMY;  Surgeon: Gaynelle Adu, MD;  Location: [facility] OR;  Service: General;  Laterality: N/A;   HAMMER TOE SURGERY Left 12/02/2017   INTRAOPERATIVE CHOLANGIOGRAM N/A 09/18/2021   Procedure: INTRAOPERATIVE CHOLANGIOGRAM;  Surgeon: Gaynelle Adu, MD;  Location[facility] OR;  Service: General;  Laterality: N/A;   KNEE SURGERY Left    Cyst behind  left knee.   TUBAL LIGATION      Social History: Social History   Socioeconomic History   Marital status: Married    Spouse name: Not on file   Number of children: Not on file   Years of education: Not on file   Highest education level: Not on file  Occupational History   Occupation: [employer]  Tobacco Use   Smoking status: Never   Smokeless tobacco: Never  Vaping Use   Vaping status: Never Used  Substance and Sexual Activity   Alcohol use: No    Alcohol/week: 0.0 standard drinks of alcohol   Drug use: No   Sexual activity: Yes  Other Topics Concern   Not on file  Social History Narrative   Not on file   Social Drivers of Health   Financial Resource Strain: Low Risk  (03/17/2023)   Overall Financial Resource Strain (CARDIA)    Difficulty of Paying Living Expenses: Not hard at all  Food Insecurity: No Food Insecurity (03/17/2023)   Hunger Vital Sign    Worried About Running Out of Food in the Last Year: Never true    Ran Out of Food in the Last Year: Never true  Transportation Needs: No Transportation Needs (03/17/2023)   PRAPARE - Administrator, Civil Service (Medical): No    Lack of Transportation (Non-Medical): No  Physical Activity: Insufficiently Active (03/17/2023)   Exercise Vital Sign    Days of Exercise per Week: 3 days    Minutes of Exercise per Session: 30 min  Stress: No Stress Concern Present (03/17/2023)   [employer] of Occupational Health - Occupational Stress Questionnaire    Feeling of Stress : Not  at all  Social Connections: Moderately Integrated (03/17/2023)   Social Connection and Isolation Panel [NHANES]    Frequency of Communication with Friends and Family: More than three times a week    Frequency of Social Gatherings with Friends and Family: More than three times a week    Attends Religious Services: More than 4 times per year    Active Member of [employer] or Organizations: No    Attends Banker Meetings: Never    Marital Status: Married  Catering manager Violence: Not At Risk (03/17/2023)   Humiliation, Afraid, Rape, and Kick questionnaire    Fear of Current or Ex-Partner: No    Emotionally Abused: No    Physically Abused: No    Sexually Abused: No    Family History: Family History  Problem Relation Age of Onset   Cancer Father        unknown type   Hypertension Mother    Colon cancer Neg Hx    Esophageal cancer Neg Hx    Rectal cancer Neg Hx    Stomach cancer Neg Hx     Current Medications:  Current Outpatient Medications:    atorvastatin (LIPITOR) 10 MG tablet, TAKE 1 TABLET BY MOUTH EVERY DAY, Disp: 90 tablet, Rfl: 2   Cholecalciferol (VITAMIN D3) 25 MCG (1000 UT) CAPS, Take 2 capsules by mouth daily., Disp: , Rfl:    gabapentin (NEURONTIN) 300 MG capsule, Take 1 capsule (300 mg total) by mouth daily as needed (nerve pain)., Disp: 90 capsule, Rfl: 2   losartan (COZAAR) 50 MG tablet, Take 1 tablet (50 mg total) by mouth daily., Disp: 30 tablet, Rfl: 1   meloxicam (MOBIC) 7.5 MG tablet, Take 7.5 mg by mouth daily., Disp: , Rfl:    sertraline (ZOLOFT) 50 MG  tablet, TAKE 1 TABLET BY MOUTH EVERY DAY, Disp: 90 tablet, Rfl: 1   XDEMVY 0.25 % SOLN, , Disp: , Rfl:    Allergies: Allergies  Allergen Reactions   Amlodipine     Pedal edema     REVIEW OF SYSTEMS:   Review of Systems  Constitutional:  Negative for chills, fatigue and fever.  HENT:   Negative for lump/mass, mouth sores, nosebleeds, sore throat and trouble swallowing.   Eyes:  Negative for eye  problems.  Respiratory:  Negative for cough and shortness of breath.   Cardiovascular:  Positive for leg swelling. Negative for chest pain and palpitations.  Gastrointestinal:  Positive for constipation. Negative for abdominal pain, diarrhea, nausea and vomiting.  Genitourinary:  Negative for bladder incontinence, difficulty urinating, dysuria, frequency, hematuria and nocturia.   Musculoskeletal:  Negative for arthralgias, back pain, flank pain, myalgias and neck pain.  Skin:  Negative for itching and rash.  Neurological:  Negative for dizziness, headaches and numbness.  Hematological:  Does not bruise/bleed easily.  Psychiatric/Behavioral:  Negative for depression, sleep disturbance and suicidal ideas. The patient is not nervous/anxious.   All other systems reviewed and are negative.    VITALS:   Blood pressure 138/70, pulse (!) 53, temperature (!) 96.2 F (35.7 C), temperature source Tympanic, resp. rate 18, weight 175 lb 4.3 oz (79.5 kg), SpO2 99%.  Wt Readings from Last 3 Encounters:  01/06/24 175 lb 4.3 oz (79.5 kg)  10/22/23 171 lb (77.6 kg)  09/17/23 171 lb 4 oz (77.7 kg)    Body mass index is 31.8 kg/m.  Performance status (ECOG): 1 - Symptomatic but completely ambulatory  PHYSICAL EXAM:   Physical Exam Vitals and nursing note reviewed. Exam conducted with a chaperone present.  Constitutional:      Appearance: Normal appearance.  Cardiovascular:     Rate and Rhythm: Normal rate and regular rhythm.     Pulses: Normal pulses.     Heart sounds: Normal heart sounds.  Pulmonary:     Effort: Pulmonary effort is normal.     Breath sounds: Normal breath sounds.  Abdominal:     Palpations: Abdomen is soft. There is no hepatomegaly, splenomegaly or mass.     Tenderness: There is no abdominal tenderness.  Musculoskeletal:     Right lower leg: 1+ Edema present.     Left lower leg: 1+ Edema present.  Lymphadenopathy:     Cervical: No cervical adenopathy.     Right  cervical: No superficial, deep or posterior cervical adenopathy.    Left cervical: No superficial, deep or posterior cervical adenopathy.     Upper Body:     Right upper body: No supraclavicular or axillary adenopathy.     Left upper body: No supraclavicular or axillary adenopathy.  Neurological:     General: No focal deficit present.     Mental Status: She is alert and oriented to person, place, and time.  Psychiatric:        Mood and Affect: Mood normal.        Behavior: Behavior normal.     LABS:      Latest Ref Rng & Units 12/30/2023    8:51 AM 08/28/2023    9:08 AM 07/31/2023    8:18 AM  CBC  WBC 4.0 - 10.5 K/uL 3.5  3.7  3.4   Hemoglobin 12.0 - 15.0 g/dL 82.9  56.2  13.0   Hematocrit 36.0 - 46.0 % 44.3  44.5  41.5   Platelets 150 -  400 K/uL 103  108  74.0 Repeated and verified X2.       Latest Ref Rng & Units 12/30/2023    8:51 AM 10/22/2023    9:37 AM 07/31/2023    8:18 AM  CMP  Glucose 70 - 99 mg/dL 147  829  562   BUN 8 - 23 mg/dL 20  15  13    Creatinine 0.44 - 1.00 mg/dL 1.30  8.65  7.84   Sodium 135 - 145 mmol/L 142  138  141   Potassium 3.5 - 5.1 mmol/L 3.6  3.7  4.2   Chloride 98 - 111 mmol/L 103  101  102   CO2 22 - 32 mmol/L 30  32  33   Calcium 8.9 - 10.3 mg/dL 69.6  29.5  28.4   Total Protein 6.5 - 8.1 g/dL 8.2   7.4   Total Bilirubin 0.0 - 1.2 mg/dL 0.6   0.7   Alkaline Phos 38 - 126 U/L 72   80   AST 15 - 41 U/L 21   17   ALT 0 - 44 U/L 22   16      No results found for: "CEA1", "CEA" / No results found for: "CEA1", "CEA" No results found for: "PSA1" No results found for: "CAN199" No results found for: "CAN125"  Lab Results  Component Value Date   TOTALPROTELP 7.5 12/30/2023   ALBUMINELP 3.4 12/30/2023   A1GS 0.3 12/30/2023   A2GS 0.9 12/30/2023   BETS 0.9 12/30/2023   GAMS 2.1 (H) 12/30/2023   MSPIKE 0.8 (H) 12/30/2023   SPEI Comment 12/30/2023   Lab Results  Component Value Date   TIBC 344 12/30/2023   TIBC 324 08/28/2023   TIBC 404  04/18/2023   FERRITIN 151 08/28/2023   FERRITIN 14 04/18/2023   FERRITIN 48 01/04/2019   IRONPCTSAT 30 12/30/2023   IRONPCTSAT 29 08/28/2023   IRONPCTSAT 32 (H) 04/18/2023   Lab Results  Component Value Date   LDH 117 12/30/2023   LDH 122 08/28/2023   LDH 118 04/18/2023     STUDIES:   No results found.

## 2024-01-06 ENCOUNTER — Inpatient Hospital Stay: Payer: Medicare PPO

## 2024-01-06 ENCOUNTER — Inpatient Hospital Stay (HOSPITAL_BASED_OUTPATIENT_CLINIC_OR_DEPARTMENT_OTHER): Payer: Medicare PPO | Admitting: Hematology

## 2024-01-06 ENCOUNTER — Ambulatory Visit (HOSPITAL_COMMUNITY)
Admission: RE | Admit: 2024-01-06 | Discharge: 2024-01-06 | Disposition: A | Discharge status: Home / Self Care | Payer: Medicare PPO | Source: Ambulatory Visit | Attending: Hematology | Admitting: Hematology

## 2024-01-06 VITALS — BP 138/70 | HR 53 | Temp 96.2°F | Resp 18 | Wt 175.3 lb

## 2024-01-06 DIAGNOSIS — D469 Myelodysplastic syndrome, unspecified: Secondary | ICD-10-CM | POA: Diagnosis not present

## 2024-01-06 DIAGNOSIS — D508 Other iron deficiency anemias: Secondary | ICD-10-CM

## 2024-01-06 DIAGNOSIS — D696 Thrombocytopenia, unspecified: Secondary | ICD-10-CM | POA: Diagnosis not present

## 2024-01-06 DIAGNOSIS — D708 Other neutropenia: Secondary | ICD-10-CM | POA: Insufficient documentation

## 2024-01-06 DIAGNOSIS — D472 Monoclonal gammopathy: Secondary | ICD-10-CM

## 2024-01-06 DIAGNOSIS — M47812 Spondylosis without myelopathy or radiculopathy, cervical region: Secondary | ICD-10-CM | POA: Diagnosis not present

## 2024-01-06 DIAGNOSIS — Z79899 Other long term (current) drug therapy: Secondary | ICD-10-CM | POA: Diagnosis not present

## 2024-01-06 DIAGNOSIS — K449 Diaphragmatic hernia without obstruction or gangrene: Secondary | ICD-10-CM | POA: Diagnosis not present

## 2024-01-06 LAB — FERRITIN: Ferritin: 93 ng/mL (ref 11–307)

## 2024-01-06 LAB — FOLATE: Folate: 15.1 ng/mL (ref >5.9)

## 2024-01-06 LAB — VITAMIN B12: Vitamin B-12: 696 pg/mL (ref 180–914)

## 2024-01-06 NOTE — Patient Instructions (Signed)
 [facility] at [facility] Discharge Instructions   You were seen and examined today by Dr. Ellin Saba.  He reviewed the results of your lab work which are normal/stable.   We will see you back in 4 months. We will repeat lab work prior to this visit.   Return as scheduled.    Thank you for choosing [facility] at [facility] to provide your oncology and hematology care.  To afford each patient quality time with our provider, please arrive at least 15 minutes before your scheduled appointment time.   If you have a lab appointment with the Cancer Center please come in thru the Main Entrance and check in at the main information desk.  You need to re-schedule your appointment should you arrive 10 or more minutes late.  We strive to give you quality time with our providers, and arriving late affects you and other patients whose appointments are after yours.  Also, if you no show three or more times for appointments you may be dismissed from the clinic at the providers discretion.     Again, thank you for choosing [facility].  Our hope is that these requests will decrease the amount of time that you wait before being seen by our physicians.       _____________________________________________________________  Should you have questions after your visit to [facility], please contact our office at [phone] and follow the prompts.  Our office hours are 8:00 a.m. and 4:30 p.m. Monday - Friday.  Please note that voicemails left after 4:00 p.m. may not be returned until the following business day.  We are closed weekends and major holidays.  You do have access to a nurse 24-7, just call the main number to the clinic [phone] and do not press any options, hold on the line and a nurse will answer the phone.    For prescription refill requests, have your pharmacy contact our office and allow 72 hours.    Due to Covid, you will  need to wear a mask upon entering the hospital. If you do not have a mask, a mask will be given to you at the Main Entrance upon arrival. For doctor visits, patients may have 1 support person age 73 or older with them. For treatment visits, patients can not have anyone with them due to social distancing guidelines and our immunocompromised population.

## 2024-01-09 ENCOUNTER — Encounter: Payer: Self-pay | Admitting: Family Medicine

## 2024-01-09 ENCOUNTER — Ambulatory Visit: Payer: Medicare PPO | Admitting: Family Medicine

## 2024-01-09 VITALS — BP 143/80 | HR 60 | Temp 97.7°F | Ht 62.25 in | Wt 178.4 lb

## 2024-01-09 DIAGNOSIS — I10 Essential (primary) hypertension: Secondary | ICD-10-CM | POA: Diagnosis not present

## 2024-01-09 DIAGNOSIS — R600 Localized edema: Secondary | ICD-10-CM

## 2024-01-09 MED ORDER — FUROSEMIDE 20 MG PO TABS: 20.0000 mg | ORAL_TABLET | Freq: Every day | ORAL | 0 refills | Status: DC

## 2024-01-09 NOTE — Progress Notes (Signed)
 Subjective:    Patient ID: Kathryn Weaver, female    DOB: 01-Feb-1946, 78 y.o.   MRN: [id number]  HPI  Wt Readings from Last 3 Encounters:  01/09/24 178 lb 6 oz (80.9 kg)  01/06/24 175 lb 4.3 oz (79.5 kg)  10/22/23 171 lb (77.6 kg)   32.36 kg/m  Vitals:   01/09/24 1148 01/09/24 1205  BP: (!) 160/84 (!) 143/80  Pulse: 60   Temp: 97.7 F (36.5 C)   SpO2: 97%    Is watching her diet  Does not eat much  Makes her own soup without salt (uses unsalted broth)   Exercise -not great    Pt presents with c/o ankle swelling and HTN   HTN bp is stable today  No cp or palpitations or headaches or edema  No side effects to medicines  BP Readings from Last 3 Encounters:  01/09/24 (!) 143/80  01/06/24 138/70  10/22/23 122/70    Pulse Readings from Last 3 Encounters:  01/09/24 60  01/06/24 (!) 53  10/22/23 (!) 52    Last visit we noted that hydrochlorothiazide caused elevated calcium  Amlodipine caused pedal edema   Started losartan 50 mg daily  She thinks is is causing edema   Does not check blood pressure at home  Has a cuff   Wears knee high supp socks   No shortness of breath     Lab Results  Component Value Date   NA 142 12/30/2023   K 3.6 12/30/2023   CO2 30 12/30/2023   GLUCOSE 130 (H) 12/30/2023   BUN 20 12/30/2023   CREATININE 0.91 12/30/2023   CALCIUM 10.7 (H) 12/30/2023   GFR 77.33 10/22/2023   GFRNONAA >60 12/30/2023   Lab Results  Component Value Date   ALT 22 12/30/2023   AST 21 12/30/2023   ALKPHOS 72 12/30/2023   BILITOT 0.6 12/30/2023      Patient Active Problem List   Diagnosis Date Noted   Pedal edema 10/22/2023   Iron deficiency anemia 05/05/2023   Fatigue 04/04/2023   Osteopenia 11/20/2022   Primary hyperparathyroidism (HCC) 06/17/2022   Hypercalcemia 05/10/2022   Other postherpetic nervous system involvement 12/12/2021   Post herpetic neuralgia 12/12/2021   Cholelithiasis 06/04/2021   Diminished pulses in lower  extremity 05/24/2020   Myelodysplastic syndrome, unspecified (HCC) 08/31/2019   Estrogen deficiency 08/31/2019   MGUS (monoclonal gammopathy of unknown significance) 09/17/2017   Prediabetes 10/21/2016   Internal hemorrhoid 04/20/2014   Screening mammogram, encounter for 03/01/2014   Uterine prolapse 03/01/2014   Hyperlipidemia 04/28/2012   Routine general medical examination at a health care facility 04/20/2012   Obesity 02/05/2012   Other neutropenia (HCC) 10/23/2010   Thrombocytopenia (HCC) 10/24/2009   POSTMENOPAUSAL STATUS 08/09/2008   Depression with anxiety 08/05/2007   Essential hypertension 08/05/2007   GERD 04/20/2007   Diverticulosis of colon 04/20/2007   History of colonic polyps 04/13/2007   Past Medical History:  Diagnosis Date   Adenomatous polyp of colon 03/2007   Anemia    s/p GI bleed, hospital 5/08   Anxiety    Depression    Diverticulosis    Endometriosis    uterine US 06/2003   GERD (gastroesophageal reflux disease)    Hiatal hernia    EGD 5/08- stricture, HH, gastritis, GERD   History of blood transfusion    Hyperlipidemia    Hypertension    Hypertension    Iron deficiency 03/16/2014   Myelodysplastic disease (HCC)    Past  Surgical History:  Procedure Laterality Date   cardiac CT  9/12   normal    CHOLECYSTECTOMY N/A 09/18/2021   Procedure: LAPAROSCOPIC CHOLECYSTECTOMY;  Surgeon: Gaynelle Adu, MD;  Location: [facility] OR;  Service: General;  Laterality: N/A;   HAMMER TOE SURGERY Left 12/02/2017   INTRAOPERATIVE CHOLANGIOGRAM N/A 09/18/2021   Procedure: INTRAOPERATIVE CHOLANGIOGRAM;  Surgeon: Gaynelle Adu, MD;  Location: [facility] OR;  Service: General;  Laterality: N/A;   KNEE SURGERY Left    Cyst behind left knee.   TUBAL LIGATION     Social History   Tobacco Use   Smoking status: Never   Smokeless tobacco: Never  Vaping Use   Vaping status: Never Used  Substance Use Topics   Alcohol use: No    Alcohol/week: 0.0 standard drinks of alcohol   Drug  use: No   Family History  Problem Relation Age of Onset   Cancer Father        unknown type   Hypertension Mother    Colon cancer Neg Hx    Esophageal cancer Neg Hx    Rectal cancer Neg Hx    Stomach cancer Neg Hx    Allergies  Allergen Reactions   Amlodipine     Pedal edema    Current Outpatient Medications on File Prior to Visit  Medication Sig Dispense Refill   atorvastatin (LIPITOR) 10 MG tablet TAKE 1 TABLET BY MOUTH EVERY DAY 90 tablet 2   Cholecalciferol (VITAMIN D3) 25 MCG (1000 UT) CAPS Take 2 capsules by mouth daily.     gabapentin (NEURONTIN) 300 MG capsule Take 1 capsule (300 mg total) by mouth daily as needed (nerve pain). 90 capsule 2   losartan (COZAAR) 50 MG tablet Take 1 tablet (50 mg total) by mouth daily. 30 tablet 1   meloxicam (MOBIC) 7.5 MG tablet Take 7.5 mg by mouth daily.     sertraline (ZOLOFT) 50 MG tablet TAKE 1 TABLET BY MOUTH EVERY DAY 90 tablet 1   XDEMVY 0.25 % SOLN      No current facility-administered medications on file prior to visit.    Review of Systems  Constitutional:  Positive for fatigue. Negative for activity change, appetite change, fever and unexpected weight change.  HENT:  Negative for congestion, ear pain, rhinorrhea, sinus pressure and sore throat.   Eyes:  Negative for pain, redness and visual disturbance.  Respiratory:  Negative for cough, shortness of breath and wheezing.   Cardiovascular:  Positive for leg swelling. Negative for chest pain and palpitations.  Gastrointestinal:  Negative for abdominal pain, blood in stool, constipation and diarrhea.  Endocrine: Negative for polydipsia and polyuria.  Genitourinary:  Negative for dysuria, frequency and urgency.  Musculoskeletal:  Positive for arthralgias. Negative for back pain and myalgias.  Skin:  Negative for pallor and rash.  Allergic/Immunologic: Negative for environmental allergies.  Neurological:  Negative for dizziness, syncope and headaches.  Hematological:  Negative  for adenopathy. Does not bruise/bleed easily.  Psychiatric/Behavioral:  Negative for decreased concentration and dysphoric mood. The patient is not nervous/anxious.        Objective:   Physical Exam Constitutional:      General: She is not in acute distress.    Appearance: Normal appearance. She is well-developed. She is obese. She is not ill-appearing or diaphoretic.  HENT:     Head: Normocephalic and atraumatic.  Eyes:     Conjunctiva/sclera: Conjunctivae normal.     Pupils: Pupils are equal, round, and reactive to light.  Neck:  Thyroid: No thyromegaly.     Vascular: No carotid bruit or JVD.  Cardiovascular:     Rate and Rhythm: Normal rate and regular rhythm.     Heart sounds: Normal heart sounds.     No gallop.     Comments: Spider veins in ankles  Compressible  Pulmonary:     Effort: Pulmonary effort is normal. No respiratory distress.     Breath sounds: Normal breath sounds. No wheezing or rales.  Abdominal:     General: There is no distension or abdominal bruit.     Palpations: Abdomen is soft.  Musculoskeletal:     Cervical back: Normal range of motion and neck supple.     Right lower leg: Edema present.     Left lower leg: Edema present.     Comments: Trace to one plus non pitting ankle edema  Seems more like puffiness than edema   No tenderness or skin change   Lymphadenopathy:     Cervical: No cervical adenopathy.  Skin:    General: Skin is warm and dry.     Coloration: Skin is not pale.     Findings: No bruising, erythema or rash.  Neurological:     Mental Status: She is alert.     Coordination: Coordination normal.     Deep Tendon Reflexes: Reflexes are normal and symmetric. Reflexes normal.  Psychiatric:        Mood and Affect: Mood normal.           Assessment & Plan:   Problem List Items Addressed This Visit       Cardiovascular and Mediastinum   Essential hypertension - Primary   BP: (!) 143/80   Not at goal  On losartan 50 mg  daily (off hydrochlorothiazide due to high ca and off amlodipine due ot ankle edema)  Now c/o ankle edema again No cardiac symptoms No pitting on exam  Some may be from venous insuff  Will try adding lasix 20 mg daily and follow up 7-10 d for visit and lab If blood pressure still high may increase losartan (which I do not think causes her edema)      Relevant Medications   furosemide (LASIX) 20 MG tablet     Other   Pedal edema   Chronic and worsening / bothersome to pt  Worse off the hydrochlorothiazide No pitting on exam Will try adding lasix 20 mg daily  Discussed low sodium eating  Discussed use of supp hose as well as elevation and exercise  Follow up 7-10 days for re check and lab

## 2024-01-09 NOTE — Assessment & Plan Note (Signed)
 BP: (!) 143/80   Not at goal  On losartan 50 mg daily (off hydrochlorothiazide due to high ca and off amlodipine due ot ankle edema)  Now c/o ankle edema again No cardiac symptoms No pitting on exam  Some may be from venous insuff  Will try adding lasix 20 mg daily and follow up 7-10 d for visit and lab If blood pressure still high may increase losartan (which I do not think causes her edema)

## 2024-01-09 NOTE — Assessment & Plan Note (Addendum)
 Chronic and worsening / bothersome to pt  Worse off the hydrochlorothiazide No pitting on exam Will try adding lasix 20 mg daily  Discussed low sodium eating  Discussed use of supp hose as well as elevation and exercise  Follow up 7-10 days for re check and lab

## 2024-01-09 NOTE — Patient Instructions (Addendum)
 Be active  Walking or other cardio Add some strength training to your routine, this is important for bone and brain health and can reduce your risk of falls and help your body use insulin properly and regulate weight  Light weights, exercise bands , and internet videos are a good way to start  Yoga (chair or regular), machines , floor exercises or a gym with machines are also good options    Continue the losartan for blood pressure -in the am   Start furosemide (lasix) for swelling (also in the morning) , it may help blood pressure  It will make you urinate more   Follow up here in 7-10 days for a visit and labs (we will need to watch potassium)   Wear your support socks   Start checking your blood pressure at home   (different times when relaxed)   Continue watching sodium intake

## 2024-01-19 ENCOUNTER — Encounter: Payer: Self-pay | Admitting: Family Medicine

## 2024-01-19 ENCOUNTER — Ambulatory Visit (INDEPENDENT_AMBULATORY_CARE_PROVIDER_SITE_OTHER): Payer: Medicare PPO | Admitting: Family Medicine

## 2024-01-19 VITALS — BP 132/66 | HR 57 | Temp 97.8°F | Ht 62.25 in | Wt 176.0 lb

## 2024-01-19 DIAGNOSIS — I10 Essential (primary) hypertension: Secondary | ICD-10-CM

## 2024-01-19 DIAGNOSIS — R600 Localized edema: Secondary | ICD-10-CM

## 2024-01-19 LAB — BASIC METABOLIC PANEL
BUN: 13 mg/dL (ref 6–23)
CO2: 31 meq/L (ref 19–32)
Calcium: 10.5 mg/dL (ref 8.4–10.5)
Chloride: 106 meq/L (ref 96–112)
Creatinine, Ser: 0.79 mg/dL (ref 0.40–1.20)
GFR: 72.53 mL/min (ref >60.00)
Glucose, Bld: 103 mg/dL — ABNORMAL HIGH (ref 70–99)
Potassium: 4 meq/L (ref 3.5–5.1)
Sodium: 142 meq/L (ref 135–145)

## 2024-01-19 NOTE — Assessment & Plan Note (Signed)
 Improved with addn of lasix  Some puffiness but no pitting No cardiac symptoms   Encouraged to wear compression hose if able   Lab today

## 2024-01-19 NOTE — Progress Notes (Signed)
 Subjective:    Patient ID: Kathryn Weaver, female    DOB: 04-Dec-1945, 78 y.o.   MRN: [id number]  HPI  Wt Readings from Last 3 Encounters:  01/19/24 176 lb (79.8 kg)  01/09/24 178 lb 6 oz (80.9 kg)  01/06/24 175 lb 4.3 oz (79.5 kg)   31.93 kg/m  Vitals:   01/19/24 0759 01/19/24 0817  BP: (!) 142/84 132/66  Pulse: (!) 57   Temp: 97.8 F (36.6 C)   SpO2: 98%     Pt presents for follow up of edema and HTN  bp is stable today  No cp or palpitations or headaches or edema  No side effects to medicines  BP Readings from Last 3 Encounters:  01/19/24 132/66  01/09/24 (!) 143/80  01/06/24 138/70    Losartan 50 mg daily  Lasix 20 mg daily was added for HTN  This is helping some /is worth it  Is urinating more   Weight is down 2 lb  Is good about low sodium diet / avoids it     Lab Results  Component Value Date   NA 142 12/30/2023   K 3.6 12/30/2023   CO2 30 12/30/2023   GLUCOSE 130 (H) 12/30/2023   BUN 20 12/30/2023   CREATININE 0.91 12/30/2023   CALCIUM 10.7 (H) 12/30/2023   GFR 77.33 10/22/2023   GFRNONAA >60 12/30/2023   Lab Results  Component Value Date   ALT 22 12/30/2023   AST 21 12/30/2023   ALKPHOS 72 12/30/2023   BILITOT 0.6 12/30/2023     Patient Active Problem List   Diagnosis Date Noted   Pedal edema 10/22/2023   Iron deficiency anemia 05/05/2023   Fatigue 04/04/2023   Osteopenia 11/20/2022   Primary hyperparathyroidism (HCC) 06/17/2022   Hypercalcemia 05/10/2022   Other postherpetic nervous system involvement 12/12/2021   Post herpetic neuralgia 12/12/2021   Cholelithiasis 06/04/2021   Diminished pulses in lower extremity 05/24/2020   Myelodysplastic syndrome, unspecified (HCC) 08/31/2019   Estrogen deficiency 08/31/2019   MGUS (monoclonal gammopathy of unknown significance) 09/17/2017   Prediabetes 10/21/2016   Internal hemorrhoid 04/20/2014   Screening mammogram, encounter for 03/01/2014   Uterine prolapse 03/01/2014    Hyperlipidemia 04/28/2012   Routine general medical examination at a health care facility 04/20/2012   Obesity 02/05/2012   Other neutropenia (HCC) 10/23/2010   Thrombocytopenia (HCC) 10/24/2009   POSTMENOPAUSAL STATUS 08/09/2008   Depression with anxiety 08/05/2007   Essential hypertension 08/05/2007   GERD 04/20/2007   Diverticulosis of colon 04/20/2007   History of colonic polyps 04/13/2007   Past Medical History:  Diagnosis Date   Adenomatous polyp of colon 03/2007   Anemia    s/p GI bleed, hospital 5/08   Anxiety    Depression    Diverticulosis    Endometriosis    uterine US 06/2003   GERD (gastroesophageal reflux disease)    Hiatal hernia    EGD 5/08- stricture, HH, gastritis, GERD   History of blood transfusion    Hyperlipidemia    Hypertension    Hypertension    Iron deficiency 03/16/2014   Myelodysplastic disease (HCC)    Past Surgical History:  Procedure Laterality Date   cardiac CT  9/12   normal    CHOLECYSTECTOMY N/A 09/18/2021   Procedure: LAPAROSCOPIC CHOLECYSTECTOMY;  Surgeon: Gaynelle Adu, MD;  Location: [facility] OR;  Service: General;  Laterality: N/A;   HAMMER TOE SURGERY Left 12/02/2017   INTRAOPERATIVE CHOLANGIOGRAM N/A 09/18/2021   Procedure: INTRAOPERATIVE CHOLANGIOGRAM;  Surgeon: Gaynelle Adu, MD;  Location: [facility] OR;  Service: General;  Laterality: N/A;   KNEE SURGERY Left    Cyst behind left knee.   TUBAL LIGATION     Social History   Tobacco Use   Smoking status: Never   Smokeless tobacco: Never  Vaping Use   Vaping status: Never Used  Substance Use Topics   Alcohol use: No    Alcohol/week: 0.0 standard drinks of alcohol   Drug use: No   Family History  Problem Relation Age of Onset   Cancer Father        unknown type   Hypertension Mother    Colon cancer Neg Hx    Esophageal cancer Neg Hx    Rectal cancer Neg Hx    Stomach cancer Neg Hx    Allergies  Allergen Reactions   Amlodipine     Pedal edema    Current Outpatient  Medications on File Prior to Visit  Medication Sig Dispense Refill   atorvastatin (LIPITOR) 10 MG tablet TAKE 1 TABLET BY MOUTH EVERY DAY 90 tablet 2   Cholecalciferol (VITAMIN D3) 25 MCG (1000 UT) CAPS Take 2 capsules by mouth daily.     furosemide (LASIX) 20 MG tablet Take 1 tablet (20 mg total) by mouth daily. 30 tablet 0   gabapentin (NEURONTIN) 300 MG capsule Take 1 capsule (300 mg total) by mouth daily as needed (nerve pain). 90 capsule 2   losartan (COZAAR) 50 MG tablet Take 1 tablet (50 mg total) by mouth daily. 30 tablet 1   meloxicam (MOBIC) 7.5 MG tablet Take 7.5 mg by mouth daily.     sertraline (ZOLOFT) 50 MG tablet TAKE 1 TABLET BY MOUTH EVERY DAY 90 tablet 1   XDEMVY 0.25 % SOLN      No current facility-administered medications on file prior to visit.    Review of Systems  Constitutional:  Negative for activity change, appetite change, fatigue, fever and unexpected weight change.  HENT:  Negative for congestion, rhinorrhea, sore throat and trouble swallowing.   Eyes:  Negative for pain, redness, itching and visual disturbance.  Respiratory:  Negative for cough, chest tightness, shortness of breath and wheezing.   Cardiovascular:  Negative for chest pain and palpitations.       Ankle swelling is improved  Gastrointestinal:  Negative for abdominal pain, blood in stool, constipation, diarrhea and nausea.  Endocrine: Negative for cold intolerance, heat intolerance, polydipsia and polyuria.  Genitourinary:  Negative for difficulty urinating, dysuria, frequency and urgency.  Musculoskeletal:  Negative for arthralgias, joint swelling and myalgias.  Skin:  Negative for pallor and rash.  Neurological:  Negative for dizziness, tremors, weakness, numbness and headaches.  Hematological:  Negative for adenopathy. Does not bruise/bleed easily.  Psychiatric/Behavioral:  Negative for decreased concentration and dysphoric mood. The patient is not nervous/anxious.        Objective:    Physical Exam Constitutional:      General: She is not in acute distress.    Appearance: Normal appearance. She is well-developed. She is obese. She is not ill-appearing or diaphoretic.  HENT:     Head: Normocephalic and atraumatic.  Eyes:     Conjunctiva/sclera: Conjunctivae normal.     Pupils: Pupils are equal, round, and reactive to light.  Neck:     Thyroid: No thyromegaly.     Vascular: No carotid bruit or JVD.  Cardiovascular:     Rate and Rhythm: Normal rate and regular rhythm.     Heart  sounds: Normal heart sounds.     No gallop.  Pulmonary:     Effort: Pulmonary effort is normal. No respiratory distress.     Breath sounds: Normal breath sounds. No wheezing or rales.  Abdominal:     General: There is no distension or abdominal bruit.     Palpations: Abdomen is soft.  Musculoskeletal:     Cervical back: Normal range of motion and neck supple.     Right lower leg: No edema.     Left lower leg: No edema.     Comments: No pitting edema  Some small varicosities in ankles   Lymphadenopathy:     Cervical: No cervical adenopathy.  Skin:    General: Skin is warm and dry.     Coloration: Skin is not pale.     Findings: No rash.  Neurological:     Mental Status: She is alert.     Coordination: Coordination normal.     Deep Tendon Reflexes: Reflexes are normal and symmetric. Reflexes normal.  Psychiatric:        Mood and Affect: Mood normal.           Assessment & Plan:   Problem List Items Addressed This Visit       Cardiovascular and Mediastinum   Essential hypertension - Primary   bp in fair control at this time with addn of lasix  Weight down 2 lb  Discussed low na diet  BP Readings from Last 1 Encounters:  01/19/24 132/66   No changes needed Most recent labs reviewed  Disc lifstyle change with low sodium diet and exercise  Continue losartan 50 mg daily  Lasix 20 mg daily - will continue if labs look ok  Bmet today          Relevant Orders    Basic metabolic panel     Other   Pedal edema   Improved with addn of lasix  Some puffiness but no pitting No cardiac symptoms   Encouraged to wear compression hose if able   Lab today      Relevant Orders   Basic metabolic panel

## 2024-01-19 NOTE — Assessment & Plan Note (Signed)
 bp in fair control at this time with addn of lasix  Weight down 2 lb  Discussed low na diet  BP Readings from Last 1 Encounters:  01/19/24 132/66   No changes needed Most recent labs reviewed  Disc lifstyle change with low sodium diet and exercise  Continue losartan 50 mg daily  Lasix 20 mg daily - will continue if labs look ok  Bmet today

## 2024-01-19 NOTE — Patient Instructions (Addendum)
 Glad swelling is improved Wear support /compression socks for residual swelling  Avoid sodium in diet   Continue current medicines   Labs today  If they look good we will refill lasix for a longer period of time   If symptoms change let ue know

## 2024-01-20 DIAGNOSIS — H01024 Squamous blepharitis left upper eyelid: Secondary | ICD-10-CM | POA: Diagnosis not present

## 2024-01-20 DIAGNOSIS — B880 Other acariasis: Secondary | ICD-10-CM | POA: Diagnosis not present

## 2024-01-20 DIAGNOSIS — H01021 Squamous blepharitis right upper eyelid: Secondary | ICD-10-CM | POA: Diagnosis not present

## 2024-01-22 ENCOUNTER — Telehealth: Payer: Self-pay | Admitting: *Deleted

## 2024-01-22 MED ORDER — FUROSEMIDE 20 MG PO TABS: 20.0000 mg | ORAL_TABLET | Freq: Every day | ORAL | 3 refills | Status: AC

## 2024-01-22 NOTE — Telephone Encounter (Signed)
-----   Message from [place] sent at 01/19/2024  5:48 PM EDT ----- Labs look good  Continue current medicines Please refill lasix 20 mg for a year Also losartan for a year if needed

## 2024-01-22 NOTE — Telephone Encounter (Signed)
 Pt notified of results and Dr. Royden Purl comments pt said she has enough losartan for now but needs lasix refilled

## 2024-02-26 DIAGNOSIS — R112 Nausea with vomiting, unspecified: Secondary | ICD-10-CM | POA: Diagnosis not present

## 2024-02-26 DIAGNOSIS — Z20822 Contact with and (suspected) exposure to covid-19: Secondary | ICD-10-CM | POA: Diagnosis not present

## 2024-02-26 DIAGNOSIS — R319 Hematuria, unspecified: Secondary | ICD-10-CM | POA: Diagnosis not present

## 2024-02-26 DIAGNOSIS — R197 Diarrhea, unspecified: Secondary | ICD-10-CM | POA: Diagnosis not present

## 2024-02-26 DIAGNOSIS — R5383 Other fatigue: Secondary | ICD-10-CM | POA: Diagnosis not present

## 2024-03-30 ENCOUNTER — Ambulatory Visit (INDEPENDENT_AMBULATORY_CARE_PROVIDER_SITE_OTHER)

## 2024-03-30 VITALS — BP 132/66 | Ht 62.5 in | Wt 169.0 lb

## 2024-03-30 DIAGNOSIS — Z0000 Encounter for general adult medical examination without abnormal findings: Secondary | ICD-10-CM | POA: Diagnosis not present

## 2024-03-30 NOTE — Patient Instructions (Signed)
 Kathryn Weaver , Thank you for taking time out of your busy schedule to complete your Annual Wellness Visit with me. I enjoyed our conversation and look forward to speaking with you again next year. I, as well as your care team,  appreciate your ongoing commitment to your health goals. Please review the following plan we discussed and let me know if I can assist you in the future. Your Game plan/ To Do List    Referrals: If you haven't heard from the office you've been referred to, please reach out to them at the phone provided.   Follow up Visits: Next Medicare AWV with our clinical staff: 03/31/2025   Have you seen your provider in the last 6 months (3 months if uncontrolled diabetes)? Yes Next Office Visit with your provider:   Clinician Recommendations:  Aim for 30 minutes of exercise or brisk walking, 6-8 glasses of water, and 5 servings of fruits and vegetables each day.       This is a list of the screening recommended for you and due dates:  Health Maintenance  Topic Date Due   DTaP/Tdap/Td vaccine (3 - Tdap) 09/16/2024*   Colon Cancer Screening  09/16/2024*   COVID-19 Vaccine (6 - 2024-25 season) 10/02/2026*   Mammogram  04/29/2024   Flu Shot  06/11/2024   Medicare Annual Wellness Visit  03/30/2025   Pneumonia Vaccine  Completed   DEXA scan (bone density measurement)  Completed   Hepatitis C Screening  Completed   Zoster (Shingles) Vaccine  Completed   HPV Vaccine  Aged Out   Meningitis B Vaccine  Aged Out  *Topic was postponed. The date shown is not the original due date.    Advanced directives: (Declined) Advance directive discussed with you today. Even though you declined this today, please call our office should you change your mind, and we can give you the proper paperwork for you to fill out. Advance Care Planning is important because it:  [x]  Makes sure you receive the medical care that is consistent with your values, goals, and preferences  [x]  It provides guidance to  your family and loved ones and reduces their decisional burden about whether or not they are making the right decisions based on your wishes.  Follow the link provided in your after visit summary or read over the paperwork we have mailed to you to help you started getting your Advance Directives in place. If you need assistance in completing these, please reach out to us  so that we can help you!  See attachments for Preventive Care and Fall Prevention Tips.

## 2024-03-30 NOTE — Progress Notes (Signed)
 Because this visit was a virtual/telehealth visit,  certain criteria was not obtained, such a blood pressure, CBG if applicable, and timed get up and go. Any medications not marked as "taking" were not mentioned during the medication reconciliation part of the visit. Any vitals not documented were not able to be obtained due to this being a telehealth visit or patient was unable to self-report a recent blood pressure reading due to a lack of equipment at home via telehealth. Vitals that have been documented are verbally provided by the patient.  This visit was performed by a medical professional under my direct supervision. I was immediately available for consultation/collaboration. I have reviewed and agree with the Annual Wellness Visit documentation.  Subjective:   Kathryn Weaver is a 78 y.o. who presents for a Medicare Wellness preventive visit.  As a reminder, Annual Wellness Visits don't include a physical exam, and some assessments may be limited, especially if this visit is performed virtually. We may recommend an in-person follow-up visit with your provider if needed.  Visit Complete: Virtual I connected with  Kathryn Weaver on 03/30/24 by a audio enabled telemedicine application and verified that I am speaking with the correct person using two identifiers.  Patient Location: Home  Provider Location: Home Office  I discussed the limitations of evaluation and management by telemedicine. The patient expressed understanding and agreed to proceed.  Vital Signs: Because this visit was a virtual/telehealth visit, some criteria may be missing or patient reported. Any vitals not documented were not able to be obtained and vitals that have been documented are patient reported.  VideoDeclined- This patient declined Librarian, academic. Therefore the visit was completed with audio only.  Persons Participating in Visit: Patient.  AWV Questionnaire: No: Patient Medicare  AWV questionnaire was not completed prior to this visit.  Cardiac Risk Factors include: advanced age (>36men, >81 women);diabetes mellitus;obesity (BMI >30kg/m2);hypertension     Objective:     Today's Vitals   03/30/24 1627  BP: 132/66  Weight: 169 lb (76.7 kg)  Height: 5' 2.5" (1.588 m)   Body mass index is 30.42 kg/m.     03/30/2024    4:27 PM 01/06/2024    8:04 AM 09/04/2023   10:22 AM 05/13/2023    3:29 PM 05/05/2023    8:02 AM 04/18/2023    8:20 AM 03/17/2023   10:38 AM  Advanced Directives  Does Patient Have a Medical Advance Directive? No No No No No No No  Would patient like information on creating a medical advance directive? No - Patient declined No - Patient declined No - Patient declined No - Patient declined No - Patient declined No - Patient declined No - Patient declined    Current Medications (verified) Outpatient Encounter Medications as of 03/30/2024  Medication Sig   atorvastatin  (LIPITOR) 10 MG tablet TAKE 1 TABLET BY MOUTH EVERY DAY   Cholecalciferol (VITAMIN D3) 25 MCG (1000 UT) CAPS Take 2 capsules by mouth daily.   furosemide  (LASIX ) 20 MG tablet Take 1 tablet (20 mg total) by mouth daily.   gabapentin  (NEURONTIN ) 300 MG capsule Take 1 capsule (300 mg total) by mouth daily as needed (nerve pain).   losartan  (COZAAR ) 50 MG tablet Take 1 tablet (50 mg total) by mouth daily.   meloxicam (MOBIC) 7.5 MG tablet Take 7.5 mg by mouth daily.   sertraline  (ZOLOFT ) 50 MG tablet TAKE 1 TABLET BY MOUTH EVERY DAY   XDEMVY 0.25 % SOLN  No facility-administered encounter medications on file as of 03/30/2024.    Allergies (verified) Amlodipine   History: Past Medical History:  Diagnosis Date   Adenomatous polyp of colon 03/2007   Anemia    s/p GI bleed, hospital 5/08   Anxiety    Depression    Diverticulosis    Endometriosis    uterine US  06/2003   GERD (gastroesophageal reflux disease)    Hiatal hernia    EGD 5/08- stricture, HH, gastritis, GERD   History  of blood transfusion    Hyperlipidemia    Hypertension    Hypertension    Iron deficiency 03/16/2014   Myelodysplastic disease (HCC)    Past Surgical History:  Procedure Laterality Date   cardiac CT  9/12   normal    CHOLECYSTECTOMY N/A 09/18/2021   Procedure: LAPAROSCOPIC CHOLECYSTECTOMY;  Surgeon: Aldean Hummingbird, MD;  Location[facility] OR;  Service: General;  Laterality: N/A;   HAMMER TOE SURGERY Left 12/02/2017   INTRAOPERATIVE CHOLANGIOGRAM N/A 09/18/2021   Procedure: INTRAOPERATIVE CHOLANGIOGRAM;  Surgeon: Aldean Hummingbird, MD;  Location: [facility] OR;  Service: General;  Laterality: N/A;   KNEE SURGERY Left    Cyst behind left knee.   TUBAL LIGATION     Family History  Problem Relation Age of Onset   Cancer Father        unknown type   Hypertension Mother    Colon cancer Neg Hx    Esophageal cancer Neg Hx    Rectal cancer Neg Hx    Stomach cancer Neg Hx    Social History   Socioeconomic History   Marital status: Married    Spouse name: Not on file   Number of children: Not on file   Years of education: Not on file   Highest education level: Not on file  Occupational History   Occupation: [employer]  Tobacco Use   Smoking status: Never   Smokeless tobacco: Never  Vaping Use   Vaping status: Never Used  Substance and Sexual Activity   Alcohol use: No    Alcohol/week: 0.0 standard drinks of alcohol   Drug use: No   Sexual activity: Yes  Other Topics Concern   Not on file  Social History Narrative   Not on file   Social Drivers of Health   Financial Resource Strain: Low Risk  (03/30/2024)   Overall Financial Resource Strain (CARDIA)    Difficulty of Paying Living Expenses: Not hard at all  Food Insecurity: No Food Insecurity (03/30/2024)   Hunger Vital Sign    Worried About Running Out of Food in the Last Year: Never true    Ran Out of Food in the Last Year: Never true  Transportation Needs: No Transportation Needs (03/30/2024)   PRAPARE - Therapist, art (Medical): No    Lack of Transportation (Non-Medical): No  Physical Activity: Insufficiently Active (03/30/2024)   Exercise Vital Sign    Days of Exercise per Week: 1 day    Minutes of Exercise per Session: 30 min  Stress: No Stress Concern Present (03/30/2024)   [employer] of Occupational Health - Occupational Stress Questionnaire    Feeling of Stress : Not at all  Social Connections: Socially Integrated (03/30/2024)   Social Connection and Isolation Panel [NHANES]    Frequency of Communication with Friends and Family: More than three times a week    Frequency of Social Gatherings with Friends and Family: More than three times a week  Attends Religious Services: More than 4 times per year    Active Member of Clubs or Organizations: Yes    Attends Banker Meetings: More than 4 times per year    Marital Status: Married    Tobacco Counseling Counseling given: Not Answered    Clinical Intake:  Pre-visit preparation completed: Yes  Pain : No/denies pain     BMI - recorded: 30.42 Nutritional Status: BMI > 30  Obese Nutritional Risks: None Diabetes: No  Lab Results  Component Value Date   HGBA1C 6.1 07/31/2023   HGBA1C 6.1 04/04/2023   HGBA1C 6.1 05/08/2022     How often do you need to have someone help you when you read instructions, pamphlets, or other written materials from your doctor or pharmacy?: 1 - Never  Interpreter Needed?: No  Information entered by :: Juliann Ochoa   Activities of Daily Living     03/30/2024    4:30 PM  In your present state of health, do you have any difficulty performing the following activities:  Hearing? 0  Vision? 0  Difficulty concentrating or making decisions? 0  Walking or climbing stairs? 0  Dressing or bathing? 0  Doing errands, shopping? 0  Preparing Food and eating ? N  Using the Toilet? N  In the past six months, have you accidently leaked urine? N  Do you have problems  with loss of bowel control? N  Managing your Medications? N  Managing your Finances? N  Housekeeping or managing your Housekeeping? N    Patient Care Team: Tower, Manley Seeds, MD as PCP - General Stann Earnest Herma Longest, MD as PCP - Cardiology (Cardiology) Tasia Farr, MD as Referring Physician (Endocrinology) Paulett Boros, MD as Medical Oncologist (Medical Oncology)  Indicate any recent Medical Services you may have received from other than [facility] providers in the past year (date may be approximate).     Assessment:    This is a routine wellness examination for Kathryn Weaver.  Hearing/Vision screen Hearing Screening - Comments:: No hearing difficulties Vision Screening - Comments:: Patient wears glasses   Goals Addressed             This Visit's Progress    Patient Stated   On track    08/24/2019, I will maintain my weight where it is.        Depression Screen     03/30/2024    4:31 PM 10/22/2023    9:02 AM 09/17/2023    9:20 AM 03/17/2023   10:37 AM 05/08/2022    8:04 AM 03/13/2022   10:50 AM 12/12/2021    9:08 AM  PHQ 2/9 Scores  PHQ - 2 Score 2 2 3  0 0 0 0  PHQ- 9 Score 2 7 10    6     Fall Risk     03/30/2024    4:29 PM 10/22/2023    9:02 AM 09/17/2023    9:19 AM 03/17/2023   10:39 AM 05/08/2022    8:04 AM  Fall Risk   Falls in the past year? 0 0 0 0   Number falls in past yr: 0 0 0 0   Injury with Fall? 0 0 0 0   Risk for fall due to : No Fall Risks No Fall Risks No Fall Risks No Fall Risks   Follow up Falls evaluation completed Falls evaluation completed Falls evaluation completed Falls prevention discussed;Falls evaluation completed;Education provided Falls evaluation completed    MEDICARE RISK AT HOME:  Medicare Risk at  Home Any stairs in or around the home?: Yes If so, are there any without handrails?: No Home free of loose throw rugs in walkways, pet beds, electrical cords, etc?: Yes Adequate lighting in your home to reduce risk of falls?: Yes Life alert?:  No Use of a cane, walker or w/c?: No Grab bars in the bathroom?: Yes Shower chair or bench in shower?: No Elevated toilet seat or a handicapped toilet?: No  TIMED UP AND GO:  Was the test performed?  No  Cognitive Function: 6CIT completed    10/25/2020    9:12 AM 08/24/2019   11:35 AM 01/28/2018   11:58 AM 11/26/2016    9:31 AM  MMSE - Mini Mental State Exam  Orientation to time 5 5 5 5   Orientation to Place 5 5 5 5   Registration 3 3 3 3   Attention/ Calculation 5 5 0 0  Recall 3 3 3 3   Language- name 2 objects   0 0  Language- repeat 1 1 1 1   Language- follow 3 step command   3 3  Language- read & follow direction   0 0  Write a sentence   0 0  Copy design   0 0  Total score   20 20        03/30/2024    4:29 PM 03/17/2023   10:40 AM 03/13/2022   10:55 AM  6CIT Screen  What Year? 0 points 0 points 0 points  What month? 0 points 0 points 0 points  What time? 0 points 0 points 0 points  Count back from 20 0 points 0 points 0 points  Months in reverse 0 points 0 points 4 points  Repeat phrase 0 points 0 points 0 points  Total Score 0 points 0 points 4 points    Immunizations Immunization History  Administered Date(s) Administered   Fluad Quad(high Dose 65+) 08/31/2019   Influenza,inj,Quad PF,6+ Mos 10/18/2015, 08/28/2016, 02/18/2018   Influenza-Unspecified 09/11/2018   PFIZER Comirnaty(Gray Top)Covid-19 Tri-Sucrose Vaccine 04/30/2021   PFIZER(Purple Top)SARS-COV-2 Vaccination 12/15/2019, 01/07/2020, 08/26/2020   Pfizer Covid-19 Vaccine Bivalent Booster 59yrs & up 09/05/2021   Pneumococcal Conjugate-13 10/18/2015   Pneumococcal Polysaccharide-23 03/01/2014   Td 03/26/1996, 08/09/2008   Zoster Recombinant(Shingrix ) 05/22/2022, 11/25/2022    Screening Tests Health Maintenance  Topic Date Due   DTaP/Tdap/Td (3 - Tdap) 09/16/2024 (Originally 08/09/2018)   Colonoscopy  09/16/2024 (Originally 08/19/2023)   COVID-19 Vaccine (6 - 2024-25 season) 10/02/2026 (Originally  07/13/2023)   MAMMOGRAM  04/29/2024   INFLUENZA VACCINE  06/11/2024   Medicare Annual Wellness (AWV)  03/30/2025   Pneumonia Vaccine 79+ Years old  Completed   DEXA SCAN  Completed   Hepatitis C Screening  Completed   Zoster Vaccines- Shingrix   Completed   HPV VACCINES  Aged Out   Meningococcal B Vaccine  Aged Out    Health Maintenance  There are no preventive care reminders to display for this patient. Health Maintenance Items Addressed:   Additional Screening:  Vision Screening: Recommended annual ophthalmology exams for early detection of glaucoma and other disorders of the eye.  Dental Screening: Recommended annual dental exams for proper oral hygiene  Community Resource Referral / Chronic Care Management: CRR required this visit?  No   CCM required this visit?  No   Plan:    I have personally reviewed and noted the following in the patient's chart:   Medical and social history Use of alcohol, tobacco or illicit drugs  Current medications and supplements  including opioid prescriptions. Patient is not currently taking opioid prescriptions. Functional ability and status Nutritional status Physical activity Advanced directives List of other physicians Hospitalizations, surgeries, and ER visits in previous 12 months Vitals Screenings to include cognitive, depression, and falls Referrals and appointments  In addition, I have reviewed and discussed with patient certain preventive protocols, quality metrics, and best practice recommendations. A written personalized care plan for preventive services as well as general preventive health recommendations were provided to patient.   Freeda Jerry, [place]   03/30/2024   After Visit Summary: (MyChart) Due to this being a telephonic visit, the after visit summary with patients personalized plan was offered to patient via MyChart   Notes: Nothing significant to report at this time.

## 2024-04-29 DIAGNOSIS — B880 Other acariasis: Secondary | ICD-10-CM | POA: Diagnosis not present

## 2024-04-29 DIAGNOSIS — H43813 Vitreous degeneration, bilateral: Secondary | ICD-10-CM | POA: Diagnosis not present

## 2024-04-29 DIAGNOSIS — H10523 Angular blepharoconjunctivitis, bilateral: Secondary | ICD-10-CM | POA: Diagnosis not present

## 2024-05-11 ENCOUNTER — Inpatient Hospital Stay: Payer: Medicare PPO | Attending: Hematology

## 2024-05-11 DIAGNOSIS — D696 Thrombocytopenia, unspecified: Secondary | ICD-10-CM | POA: Diagnosis not present

## 2024-05-11 DIAGNOSIS — D472 Monoclonal gammopathy: Secondary | ICD-10-CM | POA: Diagnosis not present

## 2024-05-11 DIAGNOSIS — D508 Other iron deficiency anemias: Secondary | ICD-10-CM

## 2024-05-11 DIAGNOSIS — D72819 Decreased white blood cell count, unspecified: Secondary | ICD-10-CM | POA: Insufficient documentation

## 2024-05-11 DIAGNOSIS — Z79899 Other long term (current) drug therapy: Secondary | ICD-10-CM | POA: Insufficient documentation

## 2024-05-11 LAB — CBC WITH DIFFERENTIAL/PLATELET
Abs Immature Granulocytes: 0.01 10*3/uL (ref 0.00–0.07)
Basophils Absolute: 0 10*3/uL (ref 0.0–0.1)
Basophils Relative: 1 %
Eosinophils Absolute: 0.1 10*3/uL (ref 0.0–0.5)
Eosinophils Relative: 3 %
HCT: 42.9 % (ref 36.0–46.0)
Hemoglobin: 13.6 g/dL (ref 12.0–15.0)
Immature Granulocytes: 0 %
Lymphocytes Relative: 42 %
Lymphs Abs: 1.7 10*3/uL (ref 0.7–4.0)
MCH: 32.3 pg (ref 26.0–34.0)
MCHC: 31.7 g/dL (ref 30.0–36.0)
MCV: 101.9 fL — ABNORMAL HIGH (ref 80.0–100.0)
Monocytes Absolute: 0.5 10*3/uL (ref 0.1–1.0)
Monocytes Relative: 12 %
Neutro Abs: 1.7 10*3/uL (ref 1.7–7.7)
Neutrophils Relative %: 42 %
Platelets: 110 10*3/uL — ABNORMAL LOW (ref 150–400)
RBC: 4.21 MIL/uL (ref 3.87–5.11)
RDW: 12.4 % (ref 11.5–15.5)
WBC: 4 10*3/uL (ref 4.0–10.5)
nRBC: 0 % (ref 0.0–0.2)

## 2024-05-11 LAB — LACTATE DEHYDROGENASE: LDH: 115 U/L (ref 98–192)

## 2024-05-11 LAB — COMPREHENSIVE METABOLIC PANEL WITH GFR
ALT: 16 U/L (ref 0–44)
AST: 17 U/L (ref 15–41)
Albumin: 3.4 g/dL — ABNORMAL LOW (ref 3.5–5.0)
Alkaline Phosphatase: 94 U/L (ref 38–126)
Anion gap: 11 (ref 5–15)
BUN: 14 mg/dL (ref 8–23)
CO2: 28 mmol/L (ref 22–32)
Calcium: 10.6 mg/dL — ABNORMAL HIGH (ref 8.9–10.3)
Chloride: 101 mmol/L (ref 98–111)
Creatinine, Ser: 0.78 mg/dL (ref 0.44–1.00)
GFR, Estimated: >60 mL/min (ref >60)
Glucose, Bld: 114 mg/dL — ABNORMAL HIGH (ref 70–99)
Potassium: 3.4 mmol/L — ABNORMAL LOW (ref 3.5–5.1)
Sodium: 140 mmol/L (ref 135–145)
Total Bilirubin: 0.6 mg/dL (ref 0.0–1.2)
Total Protein: 8 g/dL (ref 6.5–8.1)

## 2024-05-11 LAB — IRON AND TIBC
Iron: 84 ug/dL (ref 28–170)
Saturation Ratios: 26 % (ref 10.4–31.8)
TIBC: 326 ug/dL (ref 250–450)
UIBC: 242 ug/dL

## 2024-05-11 LAB — FERRITIN: Ferritin: 86 ng/mL (ref 11–307)

## 2024-05-12 LAB — PROTEIN ELECTROPHORESIS, SERUM
A/G Ratio: 0.7 (ref 0.7–1.7)
Albumin ELP: 3.1 g/dL (ref 2.9–4.4)
Alpha-1-Globulin: 0.3 g/dL (ref 0.0–0.4)
Alpha-2-Globulin: 1 g/dL (ref 0.4–1.0)
Beta Globulin: 1 g/dL (ref 0.7–1.3)
Gamma Globulin: 2.2 g/dL — ABNORMAL HIGH (ref 0.4–1.8)
Globulin, Total: 4.4 g/dL — ABNORMAL HIGH (ref 2.2–3.9)
M-Spike, %: 1.2 g/dL — ABNORMAL HIGH
Total Protein ELP: 7.5 g/dL (ref 6.0–8.5)

## 2024-05-12 LAB — KAPPA/LAMBDA LIGHT CHAINS
Kappa free light chain: 19.2 mg/L (ref 3.3–19.4)
Kappa, lambda light chain ratio: 0.55 (ref 0.26–1.65)
Lambda free light chains: 35.2 mg/L — ABNORMAL HIGH (ref 5.7–26.3)

## 2024-05-18 ENCOUNTER — Encounter: Payer: Self-pay | Admitting: Hematology

## 2024-05-18 ENCOUNTER — Inpatient Hospital Stay: Payer: Medicare PPO | Admitting: Hematology

## 2024-05-18 VITALS — BP 125/62 | HR 62 | Temp 98.6°F | Resp 18 | Ht 63.0 in | Wt 170.0 lb

## 2024-05-18 DIAGNOSIS — D72819 Decreased white blood cell count, unspecified: Secondary | ICD-10-CM | POA: Diagnosis not present

## 2024-05-18 DIAGNOSIS — Z79899 Other long term (current) drug therapy: Secondary | ICD-10-CM | POA: Diagnosis not present

## 2024-05-18 DIAGNOSIS — D472 Monoclonal gammopathy: Secondary | ICD-10-CM | POA: Diagnosis not present

## 2024-05-18 DIAGNOSIS — D696 Thrombocytopenia, unspecified: Secondary | ICD-10-CM | POA: Diagnosis not present

## 2024-05-18 NOTE — Progress Notes (Signed)
 [facility] [address] [place] [address]    Clinic Day:  05/18/2024  Referring physician: Tower, Laine LABOR, MD  Patient Care Team: Tower, Laine LABOR, MD as PCP - General Delford Maude BROCKS, MD as PCP - Cardiology (Cardiology) Tommas Pears, MD as Referring Physician (Endocrinology) Rogers Hai, MD as Medical Oncologist (Medical Oncology)   ASSESSMENT & PLAN:   Assessment: 1.  Mild leukopenia and thrombocytopenia: - Patient seen at the request of Dr. Randeen - She has intermittent mild thrombocytopenia since 2010.  Intermittent mild leukopenia since 2009. - No recurrent infections.  No tingling or numbness in extremities.  No headaches or vision changes.  No B symptoms. - She reports worsening tiredness in the last 6 months.   2.  IgM lambda monoclonal gammopathy: - She was previously found to have 1.7 g of IgM lambda monoclonal protein. - BMBX (05/26/2017): Hypercellular bone marrow for age with trilineage hematopoiesis.  3% monoclonal plasma cells were seen.  Lymphocytic component is slightly increased in number at 23% and mostly consisting of small lymphoid cells.  Flow cytometry shows predominance of T lymphocytes with no evident phenotype.   3.  Social/family history: - Lives at home with her husband and is independent of ADLs and IADLs.  She keeps her granddaughters after school.  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  Non-smoker.  No chemical exposure. - No family history of malignancies.    Plan: 1.  Mild leukopenia and thrombocytopenia: - She has intermittent mild leukopenia and thrombocytopenia for several years duration.  Latest white count is 4.0 and platelet count is 110.  Previous bone marrow biopsy did not show any evidence of dysplasia.  Will continue to monitor.  Will check Duffy antigens at next visit.   2.  IgM lambda monoclonal gammopathy: - She does not report any new bone pains.  No recent infections noted. - She  has occasional numbness in the bottom of the feet which is stable. - Reviewed labs from 05/11/2024: Calcium  mildly elevated at 10.6.  Creatinine is normal.  CBC grossly normal.  M spike is 1.2 g, up from 0.8 g before.  Lambda light chains are 35.2, increased from 27.2 with normal ratio of 0.55. - Skeletal survey on 01/06/2024: No lytic lesions seen. - She has mildly elevated calcium  levels for many years.  She takes 2000 IU of vitamin D  daily.  She does not take any calcium  supplements. - As her M spike is remaining stable, I have recommended follow-up in 6 months with repeat myeloma labs and a skeletal survey.  Will also check vitamin D  level at that time.  3.  Low ferritin levels: - Last Feraheme  was on 05/26/2023. - Hemoglobin is 13.6.  Latest ferritin level is 86 with saturation 26.  Will repeat labs in 6 months.    Orders Placed This Encounter  Procedures   DG Bone Survey Met    Standing Status:   Future    Expected Date:   11/18/2024    Expiration Date:   05/18/2025    Reason for Exam (SYMPTOM  OR DIAGNOSIS REQUIRED):   MGUS    Preferred imaging location?:   [facility]   CBC with Differential    Standing Status:   Future    Expected Date:   11/15/2024    Expiration Date:   02/13/2025   Comprehensive metabolic panel    Standing Status:   Future    Expected Date:  11/15/2024    Expiration Date:   02/13/2025   VITAMIN D  25 Hydroxy (Vit-D Deficiency, Fractures)    Standing Status:   Future    Expected Date:   11/15/2024    Expiration Date:   02/13/2025   Kappa/lambda light chains    Standing Status:   Future    Expected Date:   11/15/2024    Expiration Date:   02/13/2025   Protein electrophoresis, serum    Standing Status:   Future    Expected Date:   11/15/2024    Expiration Date:   02/13/2025   PTH, intact and calcium     Standing Status:   Future    Expected Date:   11/15/2024    Expiration Date:   02/13/2025   Type and screen    Duffy antigen A&B    Standing Status:   Future    Expected  Date:   11/15/2024    Expiration Date:   02/13/2025      LILLETTE Hummingbird R Teague,acting as a scribe for Alean Stands, MD.,have documented all relevant documentation on the behalf of Alean Stands, MD,as directed by  Alean Stands, MD while in the presence of Alean Stands, MD.  I, Alean Stands MD, have reviewed the above documentation for accuracy and completeness, and I agree with the above.    Alean Stands, MD   7/8/20253:52 PM  CHIEF COMPLAINT:   Diagnosis: Leukopenia, thrombocytopenia and MGUS    Cancer Staging  No matching staging information was found for the patient.    Prior Therapy: None  Current Therapy: Observation   HISTORY OF PRESENT ILLNESS:   Oncology History   No history exists.     INTERVAL HISTORY:   Kathryn Weaver is a 77 y.o. female presenting to clinic today for follow up of Leukopenia, thrombocytopenia and MGUS. She was last seen by me on 01/06/24.  Since her last visit, she underwent a bone survey on 01/06/24 that found: No focal lytic or blastic lesions. Progressive degenerative changes in the cervical and lumbar spine. Large hiatal hernia.  Today, she states that she is doing well overall. Her appetite level is at 100%. Her energy level is at 50%. She denies any infections, headaches, vision changes, BRBPR, melena, or other bleeding issues. She reports intermittent tingling on the soles of the bilateral feet and denies any neuropathy in the hands.   Charlena does not take any calcium  supplements. She does take 2000 units of Vitamin D  daily.    She notes a mild increase in energy after iron infusion on 05/12/24. She is independent of ADL's and IADL's.   PAST MEDICAL HISTORY:   Past Medical History: Past Medical History:  Diagnosis Date   Adenomatous polyp of colon 03/2007   Anemia    s/p GI bleed, hospital 5/08   Anxiety    Depression    Diverticulosis    Endometriosis    uterine US  06/2003   GERD (gastroesophageal  reflux disease)    Hiatal hernia    EGD 5/08- stricture, HH, gastritis, GERD   History of blood transfusion    Hyperlipidemia    Hypertension    Hypertension    Iron deficiency 03/16/2014   Myelodysplastic disease (HCC)     Surgical History: Past Surgical History:  Procedure Laterality Date   cardiac CT  9/12   normal    CHOLECYSTECTOMY N/A 09/18/2021   Procedure: LAPAROSCOPIC CHOLECYSTECTOMY;  Surgeon: Tanda Locus, MD;  Location: [facility] OR;  Service: General;  Laterality: N/A;  HAMMER TOE SURGERY Left 12/02/2017   INTRAOPERATIVE CHOLANGIOGRAM N/A 09/18/2021   Procedure: INTRAOPERATIVE CHOLANGIOGRAM;  Surgeon: Tanda Locus, MD;  Location: [facility] OR;  Service: General;  Laterality: N/A;   KNEE SURGERY Left    Cyst behind left knee.   TUBAL LIGATION      Social History: Social History   Socioeconomic History   Marital status: Married    Spouse name: Not on file   Number of children: Not on file   Years of education: Not on file   Highest education level: Not on file  Occupational History   Occupation: [employer]  Tobacco Use   Smoking status: Never   Smokeless tobacco: Never  Vaping Use   Vaping status: Never Used  Substance and Sexual Activity   Alcohol use: No    Alcohol/week: 0.0 standard drinks of alcohol   Drug use: No   Sexual activity: Yes  Other Topics Concern   Not on file  Social History Narrative   Not on file   Social Drivers of Health   Financial Resource Strain: Low Risk  (03/30/2024)   Overall Financial Resource Strain (CARDIA)    Difficulty of Paying Living Expenses: Not hard at all  Food Insecurity: No Food Insecurity (03/30/2024)   Hunger Vital Sign    Worried About Running Out of Food in the Last Year: Never true    Ran Out of Food in the Last Year: Never true  Transportation Needs: No Transportation Needs (03/30/2024)   PRAPARE - Administrator, Civil Service (Medical): No    Lack of Transportation (Non-Medical): No   Physical Activity: Insufficiently Active (03/30/2024)   Exercise Vital Sign    Days of Exercise per Week: 1 day    Minutes of Exercise per Session: 30 min  Stress: No Stress Concern Present (03/30/2024)   [employer] of Occupational Health - Occupational Stress Questionnaire    Feeling of Stress : Not at all  Social Connections: Socially Integrated (03/30/2024)   Social Connection and Isolation Panel    Frequency of Communication with Friends and Family: More than three times a week    Frequency of Social Gatherings with Friends and Family: More than three times a week    Attends Religious Services: More than 4 times per year    Active Member of [employer] or Organizations: Yes    Attends Engineer, structural: More than 4 times per year    Marital Status: Married  Catering manager Violence: Not At Risk (03/30/2024)   Humiliation, Afraid, Rape, and Kick questionnaire    Fear of Current or Ex-Partner: No    Emotionally Abused: No    Physically Abused: No    Sexually Abused: No    Family History: Family History  Problem Relation Age of Onset   Cancer Father        unknown type   Hypertension Mother    Colon cancer Neg Hx    Esophageal cancer Neg Hx    Rectal cancer Neg Hx    Stomach cancer Neg Hx     Current Medications:  Current Outpatient Medications:    atorvastatin  (LIPITOR) 10 MG tablet, TAKE 1 TABLET BY MOUTH EVERY DAY, Disp: 90 tablet, Rfl: 2   Cholecalciferol (VITAMIN D3) 25 MCG (1000 UT) CAPS, Take 2 capsules by mouth daily., Disp: , Rfl:    furosemide  (LASIX ) 20 MG tablet, Take 1 tablet (20 mg total) by mouth daily., Disp: 90 tablet, Rfl: 3   gabapentin  (  NEURONTIN ) 300 MG capsule, Take 1 capsule (300 mg total) by mouth daily as needed (nerve pain)., Disp: 90 capsule, Rfl: 2   losartan  (COZAAR ) 50 MG tablet, Take 1 tablet (50 mg total) by mouth daily., Disp: 30 tablet, Rfl: 1   meloxicam (MOBIC) 7.5 MG tablet, Take 7.5 mg by mouth daily., Disp: , Rfl:     sertraline  (ZOLOFT ) 50 MG tablet, TAKE 1 TABLET BY MOUTH EVERY DAY, Disp: 90 tablet, Rfl: 1   XDEMVY 0.25 % SOLN, , Disp: , Rfl:    Allergies: Allergies  Allergen Reactions   Amlodipine     Pedal edema     REVIEW OF SYSTEMS:   Review of Systems  Constitutional:  Positive for fatigue. Negative for chills and fever.  HENT:   Negative for lump/mass, mouth sores, nosebleeds, sore throat and trouble swallowing.   Eyes:  Negative for eye problems.  Respiratory:  Negative for cough and shortness of breath.   Cardiovascular:  Negative for chest pain, leg swelling and palpitations.  Gastrointestinal:  Negative for abdominal pain, constipation, diarrhea, nausea and vomiting.  Genitourinary:  Negative for bladder incontinence, difficulty urinating, dysuria, frequency, hematuria and nocturia.   Musculoskeletal:  Negative for arthralgias, back pain, flank pain, myalgias and neck pain.  Skin:  Negative for itching and rash.  Neurological:  Negative for dizziness, headaches and numbness.  Hematological:  Does not bruise/bleed easily.  Psychiatric/Behavioral:  Negative for depression, sleep disturbance and suicidal ideas. The patient is not nervous/anxious.   All other systems reviewed and are negative.    VITALS:   Blood pressure 125/62, pulse 62, temperature 98.6 F (37 C), temperature source Tympanic, resp. rate 18, height 5' 3 (1.6 m), weight 170 lb (77.1 kg), SpO2 96%.  Wt Readings from Last 3 Encounters:  05/18/24 170 lb (77.1 kg)  03/30/24 169 lb (76.7 kg)  01/19/24 176 lb (79.8 kg)    Body mass index is 30.11 kg/m.  Performance status (ECOG): 1 - Symptomatic but completely ambulatory  PHYSICAL EXAM:   Physical Exam Vitals and nursing note reviewed. Exam conducted with a chaperone present.  Constitutional:      Appearance: Normal appearance.  Cardiovascular:     Rate and Rhythm: Normal rate and regular rhythm.     Pulses: Normal pulses.     Heart sounds: Normal heart  sounds.  Pulmonary:     Effort: Pulmonary effort is normal.     Breath sounds: Normal breath sounds.  Abdominal:     Palpations: Abdomen is soft. There is no hepatomegaly, splenomegaly or mass.     Tenderness: There is no abdominal tenderness.  Musculoskeletal:     Right lower leg: No edema.     Left lower leg: No edema.  Lymphadenopathy:     Cervical: No cervical adenopathy.     Right cervical: No superficial, deep or posterior cervical adenopathy.    Left cervical: No superficial, deep or posterior cervical adenopathy.     Upper Body:     Right upper body: No supraclavicular or axillary adenopathy.     Left upper body: No supraclavicular or axillary adenopathy.  Neurological:     General: No focal deficit present.     Mental Status: She is alert and oriented to person, place, and time.  Psychiatric:        Mood and Affect: Mood normal.        Behavior: Behavior normal.     LABS:      Latest Ref Rng & Units  05/11/2024    8:20 AM 12/30/2023    8:51 AM 08/28/2023    9:08 AM  CBC  WBC 4.0 - 10.5 K/uL 4.0  3.5  3.7   Hemoglobin 12.0 - 15.0 g/dL 86.3  85.6  85.5   Hematocrit 36.0 - 46.0 % 42.9  44.3  44.5   Platelets 150 - 400 K/uL 110  103  108       Latest Ref Rng & Units 05/11/2024    8:20 AM 01/19/2024    8:26 AM 12/30/2023    8:51 AM  CMP  Glucose 70 - 99 mg/dL 885  896  869   BUN 8 - 23 mg/dL 14  13  20    Creatinine 0.44 - 1.00 mg/dL 9.21  9.20  9.08   Sodium 135 - 145 mmol/L 140  142  142   Potassium 3.5 - 5.1 mmol/L 3.4  4.0  3.6   Chloride 98 - 111 mmol/L 101  106  103   CO2 22 - 32 mmol/L 28  31  30    Calcium  8.9 - 10.3 mg/dL 89.3  89.4  89.2   Total Protein 6.5 - 8.1 g/dL 8.0   8.2   Total Bilirubin 0.0 - 1.2 mg/dL 0.6   0.6   Alkaline Phos 38 - 126 U/L 94   72   AST 15 - 41 U/L 17   21   ALT 0 - 44 U/L 16   22      No results found for: CEA1, CEA / No results found for: CEA1, CEA No results found for: PSA1 No results found for: CAN199 No  results found for: RJW874  Lab Results  Component Value Date   TOTALPROTELP 7.5 05/11/2024   ALBUMINELP 3.1 05/11/2024   A1GS 0.3 05/11/2024   A2GS 1.0 05/11/2024   BETS 1.0 05/11/2024   GAMS 2.2 (H) 05/11/2024   MSPIKE 1.2 (H) 05/11/2024   SPEI Comment 05/11/2024   Lab Results  Component Value Date   TIBC 326 05/11/2024   TIBC 344 12/30/2023   TIBC 324 08/28/2023   FERRITIN 86 05/11/2024   FERRITIN 93 01/06/2024   FERRITIN 151 08/28/2023   IRONPCTSAT 26 05/11/2024   IRONPCTSAT 30 12/30/2023   IRONPCTSAT 29 08/28/2023   Lab Results  Component Value Date   LDH 115 05/11/2024   LDH 117 12/30/2023   LDH 122 08/28/2023     STUDIES:   No results found.

## 2024-05-18 NOTE — Patient Instructions (Signed)

## 2024-05-24 ENCOUNTER — Other Ambulatory Visit: Payer: Self-pay | Admitting: Family Medicine

## 2024-05-24 DIAGNOSIS — Z1231 Encounter for screening mammogram for malignant neoplasm of breast: Secondary | ICD-10-CM

## 2024-06-10 ENCOUNTER — Other Ambulatory Visit: Payer: Self-pay | Admitting: Family Medicine

## 2024-06-10 NOTE — Telephone Encounter (Signed)
 Patient labs checked last 07/31/23.  Last seen 01/19/24 for HTN  No follow up  [facility] to fill as pended?

## 2024-06-10 NOTE — Telephone Encounter (Signed)
 Refilled  Please schedule annual exam after 09/16/24 with fasting labs prior

## 2024-06-11 NOTE — Telephone Encounter (Signed)
 Spoke with family member who states will have her call back to schedule her annual physical and labs one week prior

## 2024-06-16 ENCOUNTER — Ambulatory Visit
Admission: RE | Admit: 2024-06-16 | Discharge: 2024-06-16 | Disposition: A | Discharge status: Home / Self Care | Source: Ambulatory Visit | Attending: Family Medicine | Admitting: Family Medicine

## 2024-06-16 DIAGNOSIS — Z1231 Encounter for screening mammogram for malignant neoplasm of breast: Secondary | ICD-10-CM | POA: Diagnosis not present

## 2024-06-20 ENCOUNTER — Ambulatory Visit: Payer: Self-pay | Admitting: Family Medicine

## 2024-07-02 ENCOUNTER — Encounter: Payer: Self-pay | Admitting: Oncology

## 2024-07-02 NOTE — Progress Notes (Signed)
 Erroneous encounter

## 2024-08-06 ENCOUNTER — Telehealth: Payer: Self-pay | Admitting: Podiatry

## 2024-08-06 NOTE — Telephone Encounter (Signed)
 Pt left message with just name and number on voicemail.   I checked chart and she has not been seen since 2019 with Dr Burt.  I called and it just rang and no voicemail to leave a message.

## 2024-09-05 ENCOUNTER — Telehealth: Payer: Self-pay | Admitting: Family Medicine

## 2024-09-05 DIAGNOSIS — E210 Primary hyperparathyroidism: Secondary | ICD-10-CM

## 2024-09-05 DIAGNOSIS — I10 Essential (primary) hypertension: Secondary | ICD-10-CM

## 2024-09-05 DIAGNOSIS — D508 Other iron deficiency anemias: Secondary | ICD-10-CM

## 2024-09-05 DIAGNOSIS — E7800 Pure hypercholesterolemia, unspecified: Secondary | ICD-10-CM

## 2024-09-05 DIAGNOSIS — R7303 Prediabetes: Secondary | ICD-10-CM

## 2024-09-05 DIAGNOSIS — D696 Thrombocytopenia, unspecified: Secondary | ICD-10-CM

## 2024-09-05 NOTE — Telephone Encounter (Signed)
-----   Message from Veva JINNY Ferrari sent at 08/24/2024  2:55 PM EDT ----- Regarding: Lab orders for Mon, 10.27.25 Patient is scheduled for CPX labs, please order future labs, Thanks , Veva

## 2024-09-06 ENCOUNTER — Ambulatory Visit: Payer: Self-pay | Admitting: Family Medicine

## 2024-09-06 ENCOUNTER — Other Ambulatory Visit

## 2024-09-06 DIAGNOSIS — I10 Essential (primary) hypertension: Secondary | ICD-10-CM

## 2024-09-06 DIAGNOSIS — E7800 Pure hypercholesterolemia, unspecified: Secondary | ICD-10-CM | POA: Diagnosis not present

## 2024-09-06 DIAGNOSIS — R7303 Prediabetes: Secondary | ICD-10-CM | POA: Diagnosis not present

## 2024-09-06 LAB — COMPREHENSIVE METABOLIC PANEL WITH GFR
ALT: 13 U/L (ref 0–35)
AST: 17 U/L (ref 0–37)
Albumin: 3.7 g/dL (ref 3.5–5.2)
Alkaline Phosphatase: 105 U/L (ref 39–117)
BUN: 13 mg/dL (ref 6–23)
CO2: 30 meq/L (ref 19–32)
Calcium: 10.3 mg/dL (ref 8.4–10.5)
Chloride: 104 meq/L (ref 96–112)
Creatinine, Ser: 0.79 mg/dL (ref 0.40–1.20)
GFR: 72.21 mL/min (ref >60.00)
Glucose, Bld: 98 mg/dL (ref 70–99)
Potassium: 4.6 meq/L (ref 3.5–5.1)
Sodium: 140 meq/L (ref 135–145)
Total Bilirubin: 0.6 mg/dL (ref 0.2–1.2)
Total Protein: 7.3 g/dL (ref 6.0–8.3)

## 2024-09-06 LAB — CBC WITH DIFFERENTIAL/PLATELET
Basophils Absolute: 0 K/uL (ref 0.0–0.1)
Basophils Relative: 0.4 % (ref 0.0–3.0)
Eosinophils Absolute: 0.1 K/uL (ref 0.0–0.7)
Eosinophils Relative: 2.2 % (ref 0.0–5.0)
HCT: 39.6 % (ref 36.0–46.0)
Hemoglobin: 13 g/dL (ref 12.0–15.0)
Lymphocytes Relative: 43.6 % (ref 12.0–46.0)
Lymphs Abs: 1.8 K/uL (ref 0.7–4.0)
MCHC: 32.7 g/dL (ref 30.0–36.0)
MCV: 95.4 fl (ref 78.0–100.0)
Monocytes Absolute: 0.5 K/uL (ref 0.1–1.0)
Monocytes Relative: 11.8 % (ref 3.0–12.0)
Neutro Abs: 1.7 K/uL (ref 1.4–7.7)
Neutrophils Relative %: 42 % — ABNORMAL LOW (ref 43.0–77.0)
Platelets: 97 K/uL — ABNORMAL LOW (ref 150.0–400.0)
RBC: 4.15 Mil/uL (ref 3.87–5.11)
RDW: 13.4 % (ref 11.5–15.5)
WBC: 4.1 K/uL (ref 4.0–10.5)

## 2024-09-06 LAB — LIPID PANEL
Cholesterol: 187 mg/dL (ref 0–200)
HDL: 55 mg/dL (ref >39.00)
LDL Cholesterol: 119 mg/dL — ABNORMAL HIGH (ref 0–99)
NonHDL: 131.92
Total CHOL/HDL Ratio: 3
Triglycerides: 67 mg/dL (ref 0.0–149.0)
VLDL: 13.4 mg/dL (ref 0.0–40.0)

## 2024-09-06 LAB — HEMOGLOBIN A1C: Hgb A1c MFr Bld: 6.2 % (ref 4.6–6.5)

## 2024-09-06 LAB — TSH: TSH: 2.64 u[IU]/mL (ref 0.35–5.50)

## 2024-09-17 ENCOUNTER — Encounter: Payer: Self-pay | Admitting: Family Medicine

## 2024-09-17 ENCOUNTER — Ambulatory Visit (INDEPENDENT_AMBULATORY_CARE_PROVIDER_SITE_OTHER): Admitting: Family Medicine

## 2024-09-17 ENCOUNTER — Other Ambulatory Visit

## 2024-09-17 VITALS — BP 126/70 | HR 54 | Temp 97.7°F | Ht 62.25 in | Wt 171.2 lb

## 2024-09-17 DIAGNOSIS — F418 Other specified anxiety disorders: Secondary | ICD-10-CM | POA: Diagnosis not present

## 2024-09-17 DIAGNOSIS — E7800 Pure hypercholesterolemia, unspecified: Secondary | ICD-10-CM | POA: Diagnosis not present

## 2024-09-17 DIAGNOSIS — M8580 Other specified disorders of bone density and structure, unspecified site: Secondary | ICD-10-CM

## 2024-09-17 DIAGNOSIS — Z0000 Encounter for general adult medical examination without abnormal findings: Secondary | ICD-10-CM | POA: Diagnosis not present

## 2024-09-17 DIAGNOSIS — E6609 Other obesity due to excess calories: Secondary | ICD-10-CM | POA: Diagnosis not present

## 2024-09-17 DIAGNOSIS — K5900 Constipation, unspecified: Secondary | ICD-10-CM

## 2024-09-17 DIAGNOSIS — D472 Monoclonal gammopathy: Secondary | ICD-10-CM

## 2024-09-17 DIAGNOSIS — E210 Primary hyperparathyroidism: Secondary | ICD-10-CM | POA: Diagnosis not present

## 2024-09-17 DIAGNOSIS — Z860100 Personal history of colon polyps, unspecified: Secondary | ICD-10-CM

## 2024-09-17 DIAGNOSIS — R7303 Prediabetes: Secondary | ICD-10-CM | POA: Diagnosis not present

## 2024-09-17 DIAGNOSIS — I10 Essential (primary) hypertension: Secondary | ICD-10-CM

## 2024-09-17 DIAGNOSIS — E66811 Obesity, class 1: Secondary | ICD-10-CM

## 2024-09-17 DIAGNOSIS — Z6831 Body mass index (BMI) 31.0-31.9, adult: Secondary | ICD-10-CM

## 2024-09-17 DIAGNOSIS — R600 Localized edema: Secondary | ICD-10-CM

## 2024-09-17 DIAGNOSIS — E2839 Other primary ovarian failure: Secondary | ICD-10-CM

## 2024-09-17 MED ORDER — SERTRALINE HCL 50 MG PO TABS: ORAL_TABLET | ORAL | 3 refills | Status: AC

## 2024-09-17 MED ORDER — LOSARTAN POTASSIUM 50 MG PO TABS: 50.0000 mg | ORAL_TABLET | Freq: Every day | ORAL | 3 refills | Status: AC

## 2024-09-17 MED ORDER — ATORVASTATIN CALCIUM 10 MG PO TABS
10.0000 mg | ORAL_TABLET | Freq: Every day | ORAL | 3 refills | Status: AC
Start: 2024-09-17 — End: ?

## 2024-09-17 NOTE — Assessment & Plan Note (Signed)
 Reviewed health habits including diet and exercise and skin cancer prevention Reviewed appropriate screening tests for age  Also reviewed health mt list, fam hx and immunization status , as well as social and family history   See HPI Labs reviewed and ordered Health Maintenance  Topic Date Due   Colon Cancer Screening  08/19/2023   Flu Shot  02/08/2025*   DTaP/Tdap/Td vaccine (3 - Tdap) 09/17/2025*   COVID-19 Vaccine (6 - 2025-26 season) 10/03/2026*   Medicare Annual Wellness Visit  03/30/2025   Breast Cancer Screening  06/16/2025   Pneumococcal Vaccine for age over 78  Completed   DEXA scan (bone density measurement)  Completed   Hepatitis C Screening  Completed   Zoster (Shingles) Vaccine  Completed   Meningitis B Vaccine  Aged Out  *Topic was postponed. The date shown is not the original due date.    Declines flu and tetanus shots  No falls or fractures  Discussed fall prevention, supplements and exercise for bone density  PHQ 3  with treatment

## 2024-09-17 NOTE — Assessment & Plan Note (Signed)
 Disc goals for lipids and reasons to control them Rev last labs with pt Rev low sat fat diet in detail  Atorvastatin  10 mg daily  Did miss doses before labs LDL 119  Will get back on track  Encouraged compliance

## 2024-09-17 NOTE — Assessment & Plan Note (Signed)
 Lab Results  Component Value Date   CALCIUM  10.3 09/06/2024   Improved

## 2024-09-17 NOTE — Patient Instructions (Addendum)
 If you want a tetanus shot-get it at the pharmacy   Try to be more active  Walking or pedaling is good  Water exercise even better  Add some strength training to your routine, this is important for bone and brain health and can reduce your risk of falls and help your body use insulin properly and regulate weight  Light weights, exercise bands , and internet videos are a good way to start  Yoga (chair or regular), machines , floor exercises or a gym with machines are also good options    Sertraline  (zoloft ) works best if taken every day /not meant to use as needed   Think about moving your atorvastatin  (cholesterol med) to evening  It works a bit better that way  Try not to miss doses  Avoid red meat/ fried foods/ egg yolks/ fatty breakfast meats/ butter, cheese and high fat dairy/ and shellfish   Avoid added sugars in your diet when you can  Try to get most of your carbohydrates from produce (with the exception of white potatoes) and whole grains Eat less bread/pasta/rice/snack foods/cereals/sweets and other items from the middle of the grocery store (processed carbs)  For constipation  Aim for 48 oz minimum of fluid (mostly water -but other calorie free beverages are fine)  Eats lots of veggies and fruits  Miralax is helpful   Stop the iron (unless your hematologist tells you to take it)

## 2024-09-17 NOTE — Assessment & Plan Note (Signed)
 Lab Results  Component Value Date   HGBA1C 6.2 09/06/2024   HGBA1C 6.1 07/31/2023   HGBA1C 6.1 04/04/2023   disc imp of low glycemic diet and wt loss to prevent DM2

## 2024-09-17 NOTE — Progress Notes (Signed)
 Subjective:    Patient ID: Kathryn Weaver, female    DOB: [birth year], 78 y.o.   MRN: [id number]  HPI  Here for health maintenance exam and to review chronic medical problems   Wt Readings from Last 3 Encounters:  09/17/24 171 lb 4 oz (77.7 kg)  05/18/24 170 lb (77.1 kg)  03/30/24 169 lb (76.7 kg)   31.07 kg/m  Vitals:   09/17/24 0925 09/17/24 0950  BP: (!) 144/68 126/70  Pulse: (!) 54   Temp: 97.7 F (36.5 C)   SpO2: 98%     Immunization History  Administered Date(s) Administered   Fluad Quad(high Dose 65+) 08/31/2019   Influenza,inj,Quad PF,6+ Mos 10/18/2015, 08/28/2016, 02/18/2018   Influenza-Unspecified 09/11/2018   PFIZER Comirnaty(Gray Top)Covid-19 Tri-Sucrose Vaccine 04/30/2021   PFIZER(Purple Top)SARS-COV-2 Vaccination 12/15/2019, 01/07/2020, 08/26/2020   Pfizer Covid-19 Vaccine Bivalent Booster 16yrs & up 09/05/2021   Pneumococcal Conjugate-13 10/18/2015   Pneumococcal Polysaccharide-23 03/01/2014   Td 03/26/1996, 08/09/2008   Zoster Recombinant(Shingrix ) 05/22/2022, 11/25/2022    Health Maintenance Due  Topic Date Due   Colonoscopy  08/19/2023   Taking care of grandchild  Fair self care   Declines flu shot Declines tetanus shot   Mammogram 06/2024  Self breast exam- no lumps   Gyn health No problems    Colon cancer screening  08/2018 - 5 y recall recommended due to polyps Pt declined/ still declines Is over 75 now  Baseline constipation   Bone health  Dexa  11/2022  Osteopenia  Falls-none  Fractures-none  Supplements taking her vit D3  Last vitamin D  Lab Results  Component Value Date   VD25OH 24.57 (L) 07/31/2023    Exercise  Wants to join gym / could do silver sneakers     Mood    09/17/2024   10:08 AM 05/18/2024    8:13 AM 03/30/2024    4:31 PM 10/22/2023    9:02 AM 09/17/2023    9:20 AM  Depression screen PHQ 2/9  Decreased Interest 0 0 2 2 3   Down, Depressed, Hopeless 0 0 0 0 0  PHQ - 2 Score 0 0 2 2 3   Altered  sleeping 0  0 1 3  Tired, decreased energy 3  0 3 3  Change in appetite 0  0 1 1  Feeling bad or failure about yourself  0  0 0 0  Trouble concentrating 0  0 0 0  Moving slowly or fidgety/restless 0  0 0 0  Suicidal thoughts 0  0 0 0  PHQ-9 Score 3  2  7  10    Difficult doing work/chores   Not difficult at all  Not difficult at all     Data saved with a previous flowsheet row definition   PHQ 3 due to fatigue   History of dep with anxiety Sertraline  50 mg daily- does not take every day    HTN bp is stable today  No cp or palpitations or headaches or edema  No side effects to medicines  BP Readings from Last 3 Encounters:  09/17/24 126/70  05/18/24 125/62  03/30/24 132/66     Lab Results  Component Value Date   NA 140 09/06/2024   K 4.6 09/06/2024   CO2 30 09/06/2024   GLUCOSE 98 09/06/2024   BUN 13 09/06/2024   CREATININE 0.79 09/06/2024   CALCIUM  10.3 09/06/2024   GFR 72.21 09/06/2024   GFRNONAA >60 05/11/2024   Losartan  50 mg daily  Lasix  20 mg daily  History of hyperparathyroidism Lab Results  Component Value Date   CALCIUM  10.3 09/06/2024    MDS and low plt Lab Results  Component Value Date   WBC 4.1 09/06/2024   HGB 13.0 09/06/2024   HCT 39.6 09/06/2024   MCV 95.4 09/06/2024   PLT 97.0 (L) 09/06/2024   Hematology plt was 110 Stable   Lab Results  Component Value Date   IRON 84 05/11/2024   TIBC 326 05/11/2024   FERRITIN 86 05/11/2024     Hyperlipidemia Lab Results  Component Value Date   CHOL 187 09/06/2024   CHOL 152 07/31/2023   CHOL 163 05/08/2022   Lab Results  Component Value Date   HDL 55.00 09/06/2024   HDL 61.10 07/31/2023   HDL 63.10 05/08/2022   Lab Results  Component Value Date   LDLCALC 119 (H) 09/06/2024   LDLCALC 81 07/31/2023   LDLCALC 86 05/08/2022   Lab Results  Component Value Date   TRIG 67.0 09/06/2024   TRIG 48.0 07/31/2023   TRIG 70.0 05/08/2022   Lab Results  Component Value Date   CHOLHDL 3  09/06/2024   CHOLHDL 2 07/31/2023   CHOLHDL 3 05/08/2022   Lab Results  Component Value Date   LDLDIRECT 156.0 04/21/2012   LDLDIRECT 138.9 10/22/2010   LDLDIRECT 163.0 10/20/2009   Atorvastatin  10 mg daily Missing dose before labs  Occational fried chicken-not often   Does not eat much beef  No fatty pork    Prediabetes Lab Results  Component Value Date   HGBA1C 6.2 09/06/2024   HGBA1C 6.1 07/31/2023   HGBA1C 6.1 04/04/2023   Puts one spoon of sugar in coffee   Not a lot of potato or rice  Some bread - every am/ does choose one calorie version  Some sweets    Lab Results  Component Value Date   ALT 13 09/06/2024   AST 17 09/06/2024   ALKPHOS 105 09/06/2024   BILITOT 0.6 09/06/2024    Lab Results  Component Value Date   TSH 2.64 09/06/2024   Lab Results  Component Value Date   VITAMINB12 696 01/06/2024    Patient Active Problem List   Diagnosis Date Noted   Pedal edema 10/22/2023   Iron deficiency anemia 05/05/2023   Osteopenia 11/20/2022   Primary hyperparathyroidism 06/17/2022   Hypercalcemia 05/10/2022   Other postherpetic nervous system involvement 12/12/2021   Post herpetic neuralgia 12/12/2021   Diminished pulses in lower extremity 05/24/2020   Myelodysplastic syndrome, unspecified (HCC) 08/31/2019   Estrogen deficiency 08/31/2019   Constipation 03/18/2018   MGUS (monoclonal gammopathy of unknown significance) 09/17/2017   Prediabetes 10/21/2016   Internal hemorrhoid 04/20/2014   Screening mammogram, encounter for 03/01/2014   Uterine prolapse 03/01/2014   Hyperlipidemia 04/28/2012   Routine general medical examination at a health care facility 04/20/2012   Obesity 02/05/2012   Other neutropenia 10/23/2010   Thrombocytopenia 10/24/2009   POSTMENOPAUSAL STATUS 08/09/2008   Depression with anxiety 08/05/2007   Essential hypertension 08/05/2007   GERD 04/20/2007   Diverticulosis of colon 04/20/2007   History of colonic polyps 04/13/2007    Past Medical History:  Diagnosis Date   Adenomatous polyp of colon 03/2007   Anemia    s/p GI bleed, hospital 5/08   Anxiety    Depression    Diverticulosis    Endometriosis    uterine US  06/2003   GERD (gastroesophageal reflux disease)    Hiatal hernia    EGD 5/08- stricture, HH, gastritis, GERD  History of blood transfusion    Hyperlipidemia    Hypertension    Hypertension    Iron deficiency 03/16/2014   Myelodysplastic disease [facility])    Past Surgical History:  Procedure Laterality Date   cardiac CT  9/12   normal    CHOLECYSTECTOMY N/A 09/18/2021   Procedure: LAPAROSCOPIC CHOLECYSTECTOMY;  Surgeon: Tanda Locus, MD;  Location: [facility] OR;  Service: General;  Laterality: N/A;   HAMMER TOE SURGERY Left 12/02/2017   INTRAOPERATIVE CHOLANGIOGRAM N/A 09/18/2021   Procedure: INTRAOPERATIVE CHOLANGIOGRAM;  Surgeon: Tanda Locus, MD;  Location: [facility] OR;  Service: General;  Laterality: N/A;   KNEE SURGERY Left    Cyst behind left knee.   TUBAL LIGATION     Social History   Tobacco Use   Smoking status: Never   Smokeless tobacco: Never  Vaping Use   Vaping status: Never Used  Substance Use Topics   Alcohol use: No    Alcohol/week: 0.0 standard drinks of alcohol   Drug use: No   Family History  Problem Relation Age of Onset   Cancer Father        unknown type   Hypertension Mother    Colon cancer Neg Hx    Esophageal cancer Neg Hx    Rectal cancer Neg Hx    Stomach cancer Neg Hx    Allergies  Allergen Reactions   Amlodipine     Pedal edema    Current Outpatient Medications on File Prior to Visit  Medication Sig Dispense Refill   Cholecalciferol (VITAMIN D3) 25 MCG (1000 UT) CAPS Take 2 capsules by mouth daily.     furosemide  (LASIX ) 20 MG tablet Take 1 tablet (20 mg total) by mouth daily. 90 tablet 3   gabapentin  (NEURONTIN ) 300 MG capsule Take 1 capsule (300 mg total) by mouth daily as needed (nerve pain). 90 capsule 2   meloxicam (MOBIC) 7.5 MG tablet Take 7.5 mg  by mouth daily.     XDEMVY 0.25 % SOLN      No current facility-administered medications on file prior to visit.    Review of Systems  Constitutional:  Positive for fatigue. Negative for activity change, appetite change, fever and unexpected weight change.  HENT:  Negative for congestion, ear pain, rhinorrhea, sinus pressure and sore throat.   Eyes:  Negative for pain, redness and visual disturbance.  Respiratory:  Negative for cough, shortness of breath and wheezing.   Cardiovascular:  Negative for chest pain and palpitations.  Gastrointestinal:  Negative for abdominal pain, blood in stool, constipation and diarrhea.  Endocrine: Negative for polydipsia and polyuria.  Genitourinary:  Negative for dysuria, frequency and urgency.  Musculoskeletal:  Negative for arthralgias, back pain and myalgias.  Skin:  Negative for pallor and rash.  Allergic/Immunologic: Negative for environmental allergies.  Neurological:  Negative for dizziness, syncope and headaches.  Hematological:  Negative for adenopathy. Does not bruise/bleed easily.  Psychiatric/Behavioral:  Negative for decreased concentration and dysphoric mood. The patient is not nervous/anxious.        Objective:   Physical Exam Constitutional:      General: She is not in acute distress.    Appearance: Normal appearance. She is well-developed. She is obese. She is not ill-appearing or diaphoretic.  HENT:     Head: Normocephalic and atraumatic.     Right Ear: Tympanic membrane, ear canal and external ear normal.     Left Ear: Tympanic membrane, ear canal and external ear normal.     Nose: Nose normal.  No congestion.     Mouth/Throat:     Mouth: Mucous membranes are moist.     Pharynx: Oropharynx is clear. No posterior oropharyngeal erythema.  Eyes:     General: No scleral icterus.    Extraocular Movements: Extraocular movements intact.     Conjunctiva/sclera: Conjunctivae normal.     Pupils: Pupils are equal, round, and reactive  to light.  Neck:     Thyroid : No thyromegaly.     Vascular: No carotid bruit or JVD.  Cardiovascular:     Rate and Rhythm: Normal rate and regular rhythm.     Pulses: Normal pulses.     Heart sounds: Normal heart sounds.     No gallop.  Pulmonary:     Effort: Pulmonary effort is normal. No respiratory distress.     Breath sounds: Normal breath sounds. No wheezing.     Comments: Good air exch Chest:     Chest wall: No tenderness.  Abdominal:     General: Bowel sounds are normal. There is no distension or abdominal bruit.     Palpations: Abdomen is soft. There is no mass.     Tenderness: There is no abdominal tenderness.     Hernia: No hernia is present.  Genitourinary:    Comments: Breast exam: No mass, nodules, thickening, tenderness, bulging, retraction, inflamation, nipple discharge or skin changes noted.  No axillary or clavicular LA.     Musculoskeletal:        General: No tenderness. Normal range of motion.     Cervical back: Normal range of motion and neck supple. No rigidity. No muscular tenderness.     Right lower leg: No edema.     Left lower leg: No edema.     Comments: No kyphosis   Lymphadenopathy:     Cervical: No cervical adenopathy.  Skin:    General: Skin is warm and dry.     Coloration: Skin is not pale.     Findings: No erythema or rash.  Neurological:     Mental Status: She is alert. Mental status is at baseline.     Cranial Nerves: No cranial nerve deficit.     Motor: No abnormal muscle tone.     Coordination: Coordination normal.     Gait: Gait normal.     Deep Tendon Reflexes: Reflexes are normal and symmetric. Reflexes normal.  Psychiatric:        Mood and Affect: Mood normal.        Cognition and Memory: Cognition and memory normal.           Assessment & Plan:   Problem List Items Addressed This Visit       Cardiovascular and Mediastinum   Essential hypertension   bp in fair control at this time with addn of lasix   Weight down 2 lb   Discussed low na diet  BP Readings from Last 1 Encounters:  09/17/24 126/70   No changes needed Most recent labs reviewed  Disc lifstyle change with low sodium diet and exercise  Continue losartan  50 mg daily  Lasix  20 mg daily - will continue if labs look ok           Relevant Medications   atorvastatin  (LIPITOR) 10 MG tablet   losartan  (COZAAR ) 50 MG tablet     Endocrine   Primary hyperparathyroidism   Lab Results  Component Value Date   CALCIUM  10.3 09/06/2024   Improved         Musculoskeletal and  Integument   Osteopenia   Dexa due after January  No falls or fractures   Discussed fall prevention, supplements and exercise for bone density  Encouraged to continue vitamin D          Other   Routine general medical examination at a health care facility - Primary   Reviewed health habits including diet and exercise and skin cancer prevention Reviewed appropriate screening tests for age  Also reviewed health mt list, fam hx and immunization status , as well as social and family history   See HPI Labs reviewed and ordered Health Maintenance  Topic Date Due   Colon Cancer Screening  08/19/2023   Flu Shot  02/08/2025*   DTaP/Tdap/Td vaccine (3 - Tdap) 09/17/2025*   COVID-19 Vaccine (6 - 2025-26 season) 10/03/2026*   Medicare Annual Wellness Visit  03/30/2025   Breast Cancer Screening  06/16/2025   Pneumococcal Vaccine for age over 54  Completed   DEXA scan (bone density measurement)  Completed   Hepatitis C Screening  Completed   Zoster (Shingles) Vaccine  Completed   Meningitis B Vaccine  Aged Out  *Topic was postponed. The date shown is not the original due date.    Declines flu and tetanus shots  No falls or fractures  Discussed fall prevention, supplements and exercise for bone density  PHQ 3  with treatment           Prediabetes   Lab Results  Component Value Date   HGBA1C 6.2 09/06/2024   HGBA1C 6.1 07/31/2023   HGBA1C 6.1 04/04/2023    disc imp of low glycemic diet and wt loss to prevent DM2       Pedal edema   Continues lasix  20 mg daily  Labs reviewed       Obesity   Discussed how this problem influences overall health and the risks it imposes  Reviewed plan for weight loss with lower calorie diet (via better food choices (lower glycemic and portion control) along with exercise building up to or more than 30 minutes 5 days per week including some aerobic activity and strength training         MGUS (monoclonal gammopathy of unknown significance)   Continues hematology care       Hyperlipidemia   Disc goals for lipids and reasons to control them Rev last labs with pt Rev low sat fat diet in detail  Atorvastatin  10 mg daily  Did miss doses before labs LDL 119  Will get back on track  Encouraged compliance       Relevant Medications   atorvastatin  (LIPITOR) 10 MG tablet   losartan  (COZAAR ) 50 MG tablet   History of colonic polyps   Declines follow up colonoscopy       Estrogen deficiency   Depression with anxiety   PHQ 3 due to fatigue Doing well with sertaline 50 mg daily -encouraged to take more regularly  Also encouraged more physical activity       Relevant Medications   sertraline  (ZOLOFT ) 50 MG tablet   Constipation   Poor fluid intake - encouraged more  Encouraged more produce /fiber intake Miralax as needed

## 2024-09-17 NOTE — Assessment & Plan Note (Signed)
Declines follow up colonoscopy.   

## 2024-09-17 NOTE — Assessment & Plan Note (Signed)
 Continues lasix  20 mg daily  Labs reviewed

## 2024-09-17 NOTE — Assessment & Plan Note (Signed)
 bp in fair control at this time with addn of lasix   Weight down 2 lb  Discussed low na diet  BP Readings from Last 1 Encounters:  09/17/24 126/70   No changes needed Most recent labs reviewed  Disc lifstyle change with low sodium diet and exercise  Continue losartan  50 mg daily  Lasix  20 mg daily - will continue if labs look ok

## 2024-09-17 NOTE — Assessment & Plan Note (Signed)
 Dexa due after January  No falls or fractures   Discussed fall prevention, supplements and exercise for bone density  Encouraged to continue vitamin D 

## 2024-09-17 NOTE — Assessment & Plan Note (Signed)
 PHQ 3 due to fatigue Doing well with sertaline 50 mg daily -encouraged to take more regularly  Also encouraged more physical activity

## 2024-09-17 NOTE — Assessment & Plan Note (Signed)
Continues hematology care

## 2024-09-17 NOTE — Assessment & Plan Note (Signed)
 Poor fluid intake - encouraged more  Encouraged more produce /fiber intake Miralax as needed

## 2024-09-17 NOTE — Assessment & Plan Note (Signed)
 Discussed how this problem influences overall health and the risks it imposes  Reviewed plan for weight loss with lower calorie diet (via better food choices (lower glycemic and portion control) along with exercise building up to or more than 30 minutes 5 days per week including some aerobic activity and strength training

## 2024-09-23 ENCOUNTER — Telehealth: Payer: Self-pay

## 2024-09-23 NOTE — Telephone Encounter (Signed)
 It was sent on 11/7  She should take it daily unless side effects or problems

## 2024-09-23 NOTE — Telephone Encounter (Signed)
 Copied from CRM [id number]. Topic: Clinical - Medication Question >> Sep 23, 2024  9:48 AM Alfonso ORN wrote: Reason for CRM: pt called to confirm if new rx was sent for . atorvastatin  (LIPITOR) 10 MG tablet. Confirmed see new order as of 11/7. Please contact pt to confirm if should start taking this medication along with her other medication currently taking.

## 2024-09-23 NOTE — Telephone Encounter (Signed)
 Left message to return call to our office.  Please provide  message from provider/office when call is returned from patient.

## 2024-09-30 NOTE — Telephone Encounter (Signed)
 Left VM requesting pt to call the office back

## 2024-10-08 ENCOUNTER — Other Ambulatory Visit: Payer: Self-pay | Admitting: Family Medicine

## 2024-10-08 DIAGNOSIS — B0229 Other postherpetic nervous system involvement: Secondary | ICD-10-CM

## 2024-10-11 NOTE — Telephone Encounter (Signed)
 Multiple attempts to reach pt, will resolve message until pt calls back

## 2024-10-11 NOTE — Telephone Encounter (Signed)
 Last filled on 10/24/23 #90 tab/ 2 refills   CPE was on 09/17/24

## 2024-11-10 ENCOUNTER — Inpatient Hospital Stay: Attending: Hematology

## 2024-11-17 ENCOUNTER — Inpatient Hospital Stay: Admitting: Oncology

## 2025-03-21 ENCOUNTER — Ambulatory Visit

## 2025-04-05 ENCOUNTER — Ambulatory Visit
